# Patient Record
Sex: Female | Born: 1935 | Race: White | Hispanic: No | Smoking: Former smoker
Health system: Southern US, Community
[De-identification: ages and names within clinical notes are randomized; demographics above are authoritative.]

## PROBLEM LIST (undated history)

## (undated) DIAGNOSIS — H35039 Hypertensive retinopathy, unspecified eye: Secondary | ICD-10-CM

## (undated) DIAGNOSIS — H3530 Unspecified macular degeneration: Secondary | ICD-10-CM

## (undated) DIAGNOSIS — J449 Chronic obstructive pulmonary disease, unspecified: Secondary | ICD-10-CM

## (undated) DIAGNOSIS — M069 Rheumatoid arthritis, unspecified: Secondary | ICD-10-CM

## (undated) DIAGNOSIS — D329 Benign neoplasm of meninges, unspecified: Secondary | ICD-10-CM

## (undated) DIAGNOSIS — H409 Unspecified glaucoma: Secondary | ICD-10-CM

## (undated) HISTORY — PX: HYSTEROSCOPY WITH D & C: SHX1775

## (undated) HISTORY — PX: INGUINAL HERNIA REPAIR: SUR1180

## (undated) HISTORY — DX: Unspecified glaucoma: H40.9

## (undated) HISTORY — PX: APPENDECTOMY: SHX54

## (undated) HISTORY — PX: CRANIECTOMY / CRANIOTOMY FOR EXCISION OF BRAIN TUMOR: SUR320

## (undated) HISTORY — PX: CHOLECYSTECTOMY: SHX55

## (undated) HISTORY — DX: Benign neoplasm of meninges, unspecified: D32.9

## (undated) HISTORY — PX: EYE SURGERY: SHX253

## (undated) HISTORY — DX: Rheumatoid arthritis, unspecified: M06.9

## (undated) HISTORY — DX: Chronic obstructive pulmonary disease, unspecified: J44.9

## (undated) HISTORY — DX: Hypertensive retinopathy, unspecified eye: H35.039

## (undated) HISTORY — PX: YAG LASER APPLICATION: SHX6189

## (undated) HISTORY — DX: Unspecified macular degeneration: H35.30

## (undated) HISTORY — PX: CATARACT EXTRACTION: SUR2

---

## 2018-04-10 DIAGNOSIS — M069 Rheumatoid arthritis, unspecified: Secondary | ICD-10-CM | POA: Diagnosis present

## 2018-04-10 DIAGNOSIS — I10 Essential (primary) hypertension: Secondary | ICD-10-CM | POA: Insufficient documentation

## 2018-04-10 DIAGNOSIS — E785 Hyperlipidemia, unspecified: Secondary | ICD-10-CM | POA: Diagnosis present

## 2018-04-10 DIAGNOSIS — E039 Hypothyroidism, unspecified: Secondary | ICD-10-CM | POA: Diagnosis present

## 2018-04-11 DIAGNOSIS — J431 Panlobular emphysema: Secondary | ICD-10-CM | POA: Insufficient documentation

## 2019-10-24 DIAGNOSIS — M7989 Other specified soft tissue disorders: Secondary | ICD-10-CM | POA: Insufficient documentation

## 2019-10-24 DIAGNOSIS — D849 Immunodeficiency, unspecified: Secondary | ICD-10-CM | POA: Insufficient documentation

## 2019-12-10 DIAGNOSIS — N390 Urinary tract infection, site not specified: Secondary | ICD-10-CM | POA: Insufficient documentation

## 2020-01-02 ENCOUNTER — Ambulatory Visit (INDEPENDENT_AMBULATORY_CARE_PROVIDER_SITE_OTHER): Payer: Medicare Other | Admitting: Ophthalmology

## 2020-01-02 ENCOUNTER — Other Ambulatory Visit: Payer: Self-pay

## 2020-01-02 ENCOUNTER — Encounter (INDEPENDENT_AMBULATORY_CARE_PROVIDER_SITE_OTHER): Payer: Self-pay | Admitting: Ophthalmology

## 2020-01-02 DIAGNOSIS — H401190 Primary open-angle glaucoma, unspecified eye, stage unspecified: Secondary | ICD-10-CM | POA: Diagnosis present

## 2020-01-02 DIAGNOSIS — H3581 Retinal edema: Secondary | ICD-10-CM | POA: Diagnosis not present

## 2020-01-02 DIAGNOSIS — Z961 Presence of intraocular lens: Secondary | ICD-10-CM

## 2020-01-02 DIAGNOSIS — H04123 Dry eye syndrome of bilateral lacrimal glands: Secondary | ICD-10-CM

## 2020-01-02 DIAGNOSIS — H53002 Unspecified amblyopia, left eye: Secondary | ICD-10-CM

## 2020-01-02 DIAGNOSIS — H353122 Nonexudative age-related macular degeneration, left eye, intermediate dry stage: Secondary | ICD-10-CM

## 2020-01-02 DIAGNOSIS — H353211 Exudative age-related macular degeneration, right eye, with active choroidal neovascularization: Secondary | ICD-10-CM | POA: Diagnosis not present

## 2020-01-02 DIAGNOSIS — H35033 Hypertensive retinopathy, bilateral: Secondary | ICD-10-CM

## 2020-01-02 DIAGNOSIS — I10 Essential (primary) hypertension: Secondary | ICD-10-CM

## 2020-01-02 DIAGNOSIS — H401132 Primary open-angle glaucoma, bilateral, moderate stage: Secondary | ICD-10-CM

## 2020-01-02 NOTE — Progress Notes (Signed)
[facility] Note  01/02/2020     CHIEF COMPLAINT Patient presents for Retina Evaluation   HISTORY OF PRESENT ILLNESS: Kayla Hurley is a 85 y.o. female who presents to the clinic today for:   HPI    Retina Evaluation    In both eyes.  Duration of years.  Context:  night driving.  I, the attending physician,  performed the HPI with the patient and updated documentation appropriately.          Comments    Retina Eval OU per Dr. Kathlen Mody.  ARMD OU with possible CNV OD.  Elevated IOP Tuesday at Dr. Isidore Moos office 21/28.  Discussed starting glaucoma drops vs laser next visit.   POAG OS, gl susp OD, Pseudo OU, YAG PC OU, RA (Methotrexate)       Last edited by Bernarda Caffey, MD on 01/02/2020 10:29 AM. (History)      Referring physician: Hortencia Pilar, MD [place],  [place] [address]  HISTORICAL INFORMATION:   Selected notes from the MEDICAL RECORD NUMBER Referred by Dr. Kathlen Mody for concern of exu ARMD OD    CURRENT MEDICATIONS: No current outpatient medications on file. (Ophthalmic Drugs)   No current facility-administered medications for this visit. (Ophthalmic Drugs)   No current outpatient medications on file. (Other)   No current facility-administered medications for this visit. (Other)      REVIEW OF SYSTEMS: ROS    Positive for: Musculoskeletal, Endocrine   Negative for: Constitutional, Gastrointestinal, Neurological, Skin, Genitourinary, HENT, Cardiovascular, Eyes, Respiratory, Psychiatric, Allergic/Imm, Heme/Lymph   Last edited by Leonie Douglas, COA on 01/02/2020  9:10 AM. (History)       ALLERGIES Allergies  Allergen Reactions  . Penicillins Diarrhea, Nausea And Vomiting and Rash    PAST MEDICAL HISTORY Past Medical History:  Diagnosis Date  . Glaucoma   . Macular degeneration    Past Surgical History:  Procedure Laterality Date  . EYE SURGERY    . YAG LASER APPLICATION      FAMILY  HISTORY Family History  Family history unknown: Yes    SOCIAL HISTORY Social History   Tobacco Use  . Smoking status: Former Research scientist (life sciences)  . Smokeless tobacco: Never Used  Substance Use Topics  . Alcohol use: Not on file  . Drug use: Not on file         OPHTHALMIC EXAM:  Base Eye Exam    Visual Acuity (Snellen - Linear)      Right Left   Dist cc 20/20 20/80 -2   Dist ph cc  20/60 -2       Tonometry (Tonopen, 9:25 AM)      Right Left   Pressure 17 24       Pupils      Dark Light Shape React APD   Right 2 1 Round Brisk None   Left 2 1 Round Brisk None       Visual Fields (Counting fingers)      Left Right    Full Full       Extraocular Movement      Right Left    Full Full       Neuro/Psych    Oriented x3: Yes   Mood/Affect: Normal       Dilation    Both eyes: 1.0% Mydriacyl, 2.5% Phenylephrine @ 9:25 AM        Slit Lamp and Fundus Exam    Slit Lamp Exam  Right Left   Lids/Lashes Dermato Dermato, mild MGD   Conjunctiva/Sclera White and quiet White and quiet   Cornea Mild arcus, trace PEE, mild tear film debris 2-3+ PEE, mild tear film debris   Anterior Chamber Deep and quiet Deep and quiet   Iris Round and dilated Round and dilated   Lens PCIOL in excellent position, open pc PCIOL in excellent position   Vitreous syneresis syneresis       Fundus Exam      Right Left   Disc Pink, sharp, +cupping and central pallor, mild temporal PPA Pink and sharp, +cupping   C/D Ratio 0.65 0.6   Macula Blunted foveal reflex, drusen, RPE mottling, clumping and strophy. +CNV and shallow SRF, no heme Drusen, RPE mottling, clumping and early atrophy   Vessels Attenuated and mild tortuosity Attenuated and mild tortuoisty   Periphery Attached, mild peripheral drusen and mild reticular degeneration Attached, mild peripheral drusen and mild reticular degeneration        Refraction    Wearing Rx      Sphere Cylinder Axis Add   Right -0.50 +1.75 010 +2.50   Left  -2.50 +2.00 175 +2.50       Manifest Refraction      Sphere Cylinder Axis Dist VA   Right -0.75 +1.50 180 20/20   Left -1.00 +2.75 165 20/50-2          IMAGING AND PROCEDURES  Imaging and Procedures for 01/02/2020  OCT, Retina - OU - Both Eyes       Right Eye Quality was good. Central Foveal Thickness: 307. Progression has no prior data. Findings include abnormal foveal contour, no IRF, subretinal fluid, outer retinal atrophy, subretinal hyper-reflective material, retinal drusen  (CNVM w/shallow SRF).   Left Eye Quality was good. Central Foveal Thickness: 276. Progression has no prior data. Findings include normal foveal contour, no IRF, no SRF, retinal drusen , outer retinal atrophy.   Notes *Images captured and stored on drive  Diagnosis / Impression:  OD: Exu ARMD.  CNVM w/shallow SRF OS: Non-exu ARMD  Clinical management:  See below  Abbreviations: NFP - Normal foveal profile. CME - cystoid macular edema. PED - pigment epithelial detachment. IRF - intraretinal fluid. SRF - subretinal fluid. EZ - ellipsoid zone. ERM - epiretinal membrane. ORA - outer retinal atrophy. ORT - outer retinal tubulation. SRHM - subretinal hyper-reflective material. IRHM - intraretinal hyper-reflective material       Intravitreal Injection, Pharmacologic Agent - OD - Right Eye       Time Out 01/02/2020. 10:56 AM. Confirmed correct patient, procedure, site, and patient consented.   Anesthesia Topical anesthesia was used. Anesthetic medications included Lidocaine 2%, Proparacaine 0.5%.   Procedure Preparation included 5% betadine to ocular surface, eyelid speculum. A supplied needle was used.   Injection:  1.25 mg Bevacizumab (AVASTIN) SOLN   NDC: 78295-621-30, Lot: [id number] , Expiration date: 01/29/2020   Route: Intravitreal, Site: Right Eye, Waste: 0 mL  Post-op Post injection exam found visual acuity of at least counting fingers, no retinal detachment. The patient tolerated the  procedure well. There were no complications. The patient received written and verbal post procedure care education. Post injection medications were not given.                 ASSESSMENT/PLAN:    ICD-10-CM   1. Exudative age-related macular degeneration of right eye with active choroidal neovascularization (HCC)  H35.3211 Intravitreal Injection, Pharmacologic Agent - OD - Right Eye  Bevacizumab (AVASTIN) SOLN 1.25 mg  2. Retinal edema  H35.81 OCT, Retina - OU - Both Eyes  3. Intermediate stage nonexudative age-related macular degeneration of left eye  H35.3122   4. Amblyopia of eye, left  H53.002   5. Primary open angle glaucoma (POAG) of both eyes, moderate stage  H40.1132   6. Essential hypertension  I10   7. Hypertensive retinopathy of both eyes  H35.033   8. Pseudophakia of both eyes  Z96.1   9. Dry eyes, bilateral  H04.123     1,2. Exudative age related macular degeneration, OD  - The incidence pathology and anatomy of wet AMD discussed   - discussed treatment options including observation vs intravitreal anti-VEGF agents such as Avastin, Lucentis, Eylea.    - Risks of endophthalmitis and vascular occlusive events and atrophic changes discussed with patient  - OCT 7.29.21 shows CNVM w/shallow SRF OD  - BCVA 20/20  - recommend IVA OD #1 today, 7.29.21 -- for +CNV w/ SRF OD (OS amblyopic) - pt wishes to be treated with IVA OD - RBA of procedure discussed, questions answered - informed consent obtained and signed - see procedure note  - f/u in 4 wks -- DFE/OCT/Optos FA (Transit OD), possible injection  3. Age related macular degeneration, non-exudative, OS  - The incidence, anatomy, and pathology of dry AMD, risk of progression, and the AREDS and AREDS 2 studies including smoking risks discussed with patient.  - Recommend amsler grid monitoring  4. Amblyopia OS  - long standing  - monitor  5. POAG OU  - under the expert management of Dr. Kathlen Mody  - IOP 17, 24  6,7.  Hypertensive retinopathy OU - discussed importance of tight BP control - monitor  8. Pseudophakia OU  - s/p CE/IOL  - IOL in good position, doing well  - monitor  9. Dry eyes OU - recommend artificial tears and lubricating ointment as needed  Ophthalmic Meds Ordered this visit:  Meds ordered this encounter  Medications  . Bevacizumab (AVASTIN) SOLN 1.25 mg       Return in about 4 weeks (around 01/30/2020) for exu ARMD OD w/DFE/OCT/Optos FA (Transit OD), poss. inj..  There are no Patient Instructions on file for this visit.   Explained the diagnoses, plan, and follow up with the patient and they expressed understanding.  Patient expressed understanding of the importance of proper follow up care.  This document serves as a record of services personally performed by Gardiner Sleeper, MD, PhD. It was created on their behalf by Estill Bakes, COT an ophthalmic technician. The creation of this record is the provider's dictation and/or activities during the visit.    Electronically signed by: Estill Bakes, COT 7.29.21 @ 1:46 AM  Gardiner Sleeper, M.D., Ph.D. Diseases & Surgery of the Retina and Vitreous [facility]  I have reviewed the above documentation for accuracy and completeness, and I agree with the above. Gardiner Sleeper, M.D., Ph.D. 01/05/20 1:46 AM   Abbreviations: M myopia (nearsighted); A astigmatism; H hyperopia (farsighted); P presbyopia; Mrx spectacle prescription;  CTL contact lenses; OD right eye; OS left eye; OU both eyes  XT exotropia; ET esotropia; PEK punctate epithelial keratitis; PEE punctate epithelial erosions; DES dry eye syndrome; MGD meibomian gland dysfunction; ATs artificial tears; PFAT's preservative free artificial tears; [place] nuclear sclerotic cataract; PSC posterior subcapsular cataract; ERM epi-retinal membrane; PVD posterior vitreous detachment; RD retinal detachment; DM diabetes mellitus; DR diabetic retinopathy; NPDR  non-proliferative diabetic  retinopathy; PDR proliferative diabetic retinopathy; CSME clinically significant macular edema; DME diabetic macular edema; dbh dot blot hemorrhages; CWS cotton wool spot; POAG primary open angle glaucoma; C/D cup-to-disc ratio; HVF humphrey visual field; GVF goldmann visual field; OCT optical coherence tomography; IOP intraocular pressure; BRVO Branch retinal vein occlusion; CRVO central retinal vein occlusion; CRAO central retinal artery occlusion; BRAO branch retinal artery occlusion; RT retinal tear; SB scleral buckle; PPV pars plana vitrectomy; VH Vitreous hemorrhage; PRP panretinal laser photocoagulation; IVK intravitreal kenalog; VMT vitreomacular traction; MH Macular hole;  NVD neovascularization of the disc; NVE neovascularization elsewhere; AREDS age related eye disease study; ARMD age related macular degeneration; POAG primary open angle glaucoma; EBMD epithelial/anterior basement membrane dystrophy; ACIOL anterior chamber intraocular lens; IOL intraocular lens; PCIOL posterior chamber intraocular lens; Phaco/IOL phacoemulsification with intraocular lens placement; [place] photorefractive keratectomy; LASIK laser assisted in situ keratomileusis; HTN hypertension; DM diabetes mellitus; COPD chronic obstructive pulmonary disease

## 2020-01-05 MED ORDER — BEVACIZUMAB CHEMO INJECTION 1.25MG/0.05ML SYRINGE FOR KALEIDOSCOPE
1.2500 mg | INTRAVITREAL | Status: AC | PRN
Start: 2020-01-05 — End: 2020-01-05
  Administered 2020-01-05: 1.25 mg via INTRAVITREAL

## 2020-01-07 DIAGNOSIS — H35039 Hypertensive retinopathy, unspecified eye: Secondary | ICD-10-CM | POA: Insufficient documentation

## 2020-01-30 NOTE — Progress Notes (Addendum)
[facility] Note  02/03/2020     CHIEF COMPLAINT Patient presents for Retina Follow Up   HISTORY OF PRESENT ILLNESS: Kayla Hurley is a 85 y.o. female who presents to the clinic today for:   HPI    Retina Follow Up    Patient presents with  Wet AMD.  In right eye.  This started weeks ago.  Severity is moderate.  Duration of weeks.  Since onset it is stable.  I, the attending physician,  performed the HPI with the patient and updated documentation appropriately.          Comments    Pt states vision is the same OU.  Patient denies eye pain or discomfort.  Patient denies any new or worsening floaters or fol OU.       Last edited by Bernarda Caffey, MD on 02/03/2020 12:42 PM. (History)    pt states she had some floaters after her first injection, but she doesn't feel like her vision has changed since then, pt has an appt with Dr. Kathlen Mody tomorrow   Referring physician: Hortencia Pilar, MD [place],  [place] [address]  HISTORICAL INFORMATION:   Selected notes from the MEDICAL RECORD NUMBER Referred by Dr. Kathlen Mody for concern of exu ARMD OD    CURRENT MEDICATIONS: No current outpatient medications on file. (Ophthalmic Drugs)   No current facility-administered medications for this visit. (Ophthalmic Drugs)   No current outpatient medications on file. (Other)   No current facility-administered medications for this visit. (Other)      REVIEW OF SYSTEMS: ROS    Positive for: Musculoskeletal, Endocrine   Negative for: Constitutional, Gastrointestinal, Neurological, Skin, Genitourinary, HENT, Cardiovascular, Eyes, Respiratory, Psychiatric, Allergic/Imm, Heme/Lymph   Last edited by Doneen Poisson on 02/03/2020  9:25 AM. (History)       ALLERGIES Allergies  Allergen Reactions  . Penicillins Diarrhea, Nausea And Vomiting and Rash    PAST MEDICAL HISTORY Past Medical History:  Diagnosis Date  . Glaucoma   . Macular  degeneration    Past Surgical History:  Procedure Laterality Date  . EYE SURGERY    . YAG LASER APPLICATION      FAMILY HISTORY Family History  Family history unknown: Yes    SOCIAL HISTORY Social History   Tobacco Use  . Smoking status: Former Research scientist (life sciences)  . Smokeless tobacco: Never Used  Substance Use Topics  . Alcohol use: Not on file  . Drug use: Not on file         OPHTHALMIC EXAM:  Base Eye Exam    Visual Acuity (Snellen - Linear)      Right Left   Dist [place] 20/20 20/70   Dist ph [place]  NI       Tonometry (Tonopen, 9:31 AM)      Right Left   Pressure 18 22       Pupils      Dark Light Shape React APD   Right 3 2 Round Brisk 0   Left 3 2 Round Brisk 0       Visual Fields      Left Right    Full Full       Extraocular Movement      Right Left    Full Full       Neuro/Psych    Oriented x3: Yes   Mood/Affect: Normal       Dilation    Both eyes: 1.0% Mydriacyl,  2.5% Phenylephrine @ 9:31 AM        Slit Lamp and Fundus Exam    Slit Lamp Exam      Right Left   Lids/Lashes Dermato Dermato, mild MGD   Conjunctiva/Sclera White and quiet White and quiet   Cornea Mild arcus, trace PEE, mild tear film debris 2-3+ PEE, mild tear film debris   Anterior Chamber Deep and quiet Deep and quiet   Iris Round and dilated Round and dilated   Lens PCIOL in excellent position, open pc PCIOL in excellent position   Vitreous syneresis syneresis       Fundus Exam      Right Left   Disc Pink, sharp, +cupping and central pallor, mild temporal PPA Pink and sharp, +cupping   C/D Ratio 0.65 0.6   Macula Blunted foveal reflex, drusen, RPE mottling, clumping and atrophy, +CNV and shallow SRF - improved, no heme Drusen, RPE mottling, clumping and early atrophy   Vessels Attenuated and mild tortuosity Attenuated and mild tortuoisty   Periphery Attached, mild peripheral drusen and mild reticular degeneration, focal paving stone inferiorly Attached, mild peripheral drusen and  mild reticular degeneration        Refraction    Wearing Rx      Sphere Cylinder Axis Add   Right -0.50 +1.75 010 +2.50   Left -2.50 +2.00 175 +2.50          IMAGING AND PROCEDURES  Imaging and Procedures for 02/03/2020  OCT, Retina - OU - Both Eyes       Right Eye Quality was good. Central Foveal Thickness: 288. Progression has improved. Findings include abnormal foveal contour, no IRF, outer retinal atrophy, retinal drusen , no SRF, pigment epithelial detachment (Interval improvement in [facility] and SRF overlying low lying PED).   Left Eye Quality was good. Central Foveal Thickness: 271. Progression has been stable. Findings include normal foveal contour, no IRF, no SRF, retinal drusen , outer retinal atrophy.   Notes *Images captured and stored on drive  Diagnosis / Impression:  OD: exudative ARMD -- Interval improvement in [facility] and SRF overlying low lying PED OS: nonexudative ARMD;  NFP, no IRF/SRF  Clinical management:  See below  Abbreviations: NFP - Normal foveal profile. CME - cystoid macular edema. PED - pigment epithelial detachment. IRF - intraretinal fluid. SRF - subretinal fluid. EZ - ellipsoid zone. ERM - epiretinal membrane. ORA - outer retinal atrophy. ORT - outer retinal tubulation. SRHM - subretinal hyper-reflective material. IRHM - intraretinal hyper-reflective material        Fluorescein Angiography Optos (Transit OD)       Right Eye   Progression has no prior data. Early phase findings include delayed filling, window defect, staining (Focal delay in venous return IT venule). Mid/Late phase findings include staining, leakage, window defect, choroidal neovascularization (Focal leaking CNV IT to fovea).   Left Eye   Progression has no prior data. Early phase findings include (No early images). Mid/Late phase findings include staining, window defect (No leakage, no CNV).   Notes **Images stored on drive**  Impression: OD: exu ARMD with focal CNV  IT to fovea; Focal delay in venous return IT venule OS: non-exu ARMD, no CNV, no leakage         Intravitreal Injection, Pharmacologic Agent - OD - Right Eye       Time Out 02/03/2020. 11:18 AM. Confirmed correct patient, procedure, site, and patient consented.   Anesthesia Topical anesthesia was used. Anesthetic medications included Lidocaine 2%,  Proparacaine 0.5%.   Procedure Preparation included 5% betadine to ocular surface, eyelid speculum. A supplied needle was used.   Injection:  1.25 mg Bevacizumab (AVASTIN) SOLN   NDC: 00762-263-33, Lot: [id number] , Expiration date: 03/22/2020   Route: Intravitreal, Site: Right Eye, Waste: 0 mL  Post-op Post injection exam found visual acuity of at least counting fingers. The patient tolerated the procedure well. There were no complications. The patient received written and verbal post procedure care education.                 ASSESSMENT/PLAN:    ICD-10-CM   1. Exudative age-related macular degeneration of right eye with active choroidal neovascularization (HCC)  H35.3211 Intravitreal Injection, Pharmacologic Agent - OD - Right Eye    Bevacizumab (AVASTIN) SOLN 1.25 mg  2. Retinal edema  H35.81 OCT, Retina - OU - Both Eyes  3. Intermediate stage nonexudative age-related macular degeneration of left eye  H35.3122   4. Amblyopia of eye, left  H53.002   5. Primary open angle glaucoma (POAG) of both eyes, moderate stage  H40.1132   6. Essential hypertension  I10   7. Hypertensive retinopathy of both eyes  H35.033 Fluorescein Angiography Optos (Transit OD)  8. Pseudophakia of both eyes  Z96.1   9. Dry eyes, bilateral  H04.123     1,2. Exudative age related macular degeneration, OD  - OCT at presentation 07.29.21 showed CNVM w/shallow SRF OD             - s/p IVA OD # 1 (07.29.21)  - BCVA 20/20 (stable)  - OCT shows shows interval improvement in [facility] and SRF OD  - FA 8.30.21 shows focal CNVM  - recommend IVA OD #2 today,  08.30.21 - pt wishes to be treated with IVA OD - RBA of procedure discussed, questions answered - informed consent obtained and signed - see procedure note  - f/u in 4 wks -- DFE/OCT/possible injection  3. Age related macular degeneration, non-exudative, OS  - The incidence, anatomy, and pathology of dry AMD, risk of progression, and the AREDS and AREDS 2 studies including smoking risks discussed with patient.  - Recommend amsler grid monitoring  4. Amblyopia OS  - long standing  - monitor  5. POAG OU  - under the expert management of Dr. 09.12.21  - IOP 18,22  6,7. Hypertensive retinopathy OU - discussed importance of tight BP control - monitor  8. Pseudophakia OU  - s/p CE/IOL  - IOL in good position, doing well  - monitor  9. Dry eyes OU - recommend artificial tears and lubricating ointment as needed  Ophthalmic Meds Ordered this visit:  Meds ordered this encounter  Medications  . Bevacizumab (AVASTIN) SOLN 1.25 mg       Return in about 4 weeks (around 03/02/2020) for f/u exu ARMD OD, DFE, OCT.  There are no Patient Instructions on file for this visit.  This document serves as a record of services personally performed by 03/15/2020, MD, PhD. It was created on their behalf by Gardiner Sleeper, [place], an ophthalmic technician. The creation of this record is the provider's dictation and/or activities during the visit.    Electronically signed by: [address], COA 08.26.2021 12:53 PM   This document serves as a record of services personally performed by 09.08.2021, MD, PhD. It was created on their behalf by Gardiner Sleeper. San Jetty, OA an ophthalmic technician. The creation of this record is the provider's dictation and/or activities during the visit.  Electronically signed by: San Jetty. Owens Shark, [place] 08.30.2021 12:53 PM  Gardiner Sleeper, M.D., Ph.D. Diseases & Surgery of the Retina and [place] 02/03/2020   I have reviewed the  above documentation for accuracy and completeness, and I agree with the above. Gardiner Sleeper, M.D., Ph.D. 02/03/20 12:53 PM   Abbreviations: M myopia (nearsighted); A astigmatism; H hyperopia (farsighted); P presbyopia; Mrx spectacle prescription;  CTL contact lenses; OD right eye; OS left eye; OU both eyes  XT exotropia; ET esotropia; PEK punctate epithelial keratitis; PEE punctate epithelial erosions; DES dry eye syndrome; MGD meibomian gland dysfunction; ATs artificial tears; PFAT's preservative free artificial tears; [place] nuclear sclerotic cataract; PSC posterior subcapsular cataract; ERM epi-retinal membrane; PVD posterior vitreous detachment; RD retinal detachment; DM diabetes mellitus; DR diabetic retinopathy; NPDR non-proliferative diabetic retinopathy; PDR proliferative diabetic retinopathy; CSME clinically significant macular edema; DME diabetic macular edema; dbh dot blot hemorrhages; CWS cotton wool spot; POAG primary open angle glaucoma; C/D cup-to-disc ratio; HVF humphrey visual field; GVF goldmann visual field; OCT optical coherence tomography; IOP intraocular pressure; BRVO Branch retinal vein occlusion; CRVO central retinal vein occlusion; CRAO central retinal artery occlusion; BRAO branch retinal artery occlusion; RT retinal tear; SB scleral buckle; PPV pars plana vitrectomy; VH Vitreous hemorrhage; PRP panretinal laser photocoagulation; IVK intravitreal kenalog; VMT vitreomacular traction; MH Macular hole;  NVD neovascularization of the disc; NVE neovascularization elsewhere; AREDS age related eye disease study; ARMD age related macular degeneration; POAG primary open angle glaucoma; EBMD epithelial/anterior basement membrane dystrophy; ACIOL anterior chamber intraocular lens; IOL intraocular lens; PCIOL posterior chamber intraocular lens; Phaco/IOL phacoemulsification with intraocular lens placement; [place] photorefractive keratectomy; LASIK laser assisted in situ keratomileusis; HTN  hypertension; DM diabetes mellitus; COPD chronic obstructive pulmonary disease

## 2020-02-03 ENCOUNTER — Other Ambulatory Visit: Payer: Self-pay

## 2020-02-03 ENCOUNTER — Ambulatory Visit (INDEPENDENT_AMBULATORY_CARE_PROVIDER_SITE_OTHER): Payer: Medicare Other | Admitting: Ophthalmology

## 2020-02-03 ENCOUNTER — Encounter (INDEPENDENT_AMBULATORY_CARE_PROVIDER_SITE_OTHER): Payer: Self-pay | Admitting: Ophthalmology

## 2020-02-03 DIAGNOSIS — H35033 Hypertensive retinopathy, bilateral: Secondary | ICD-10-CM

## 2020-02-03 DIAGNOSIS — H3581 Retinal edema: Secondary | ICD-10-CM | POA: Diagnosis not present

## 2020-02-03 DIAGNOSIS — H353211 Exudative age-related macular degeneration, right eye, with active choroidal neovascularization: Secondary | ICD-10-CM | POA: Diagnosis not present

## 2020-02-03 DIAGNOSIS — H401132 Primary open-angle glaucoma, bilateral, moderate stage: Secondary | ICD-10-CM

## 2020-02-03 DIAGNOSIS — H353122 Nonexudative age-related macular degeneration, left eye, intermediate dry stage: Secondary | ICD-10-CM | POA: Diagnosis not present

## 2020-02-03 DIAGNOSIS — H53002 Unspecified amblyopia, left eye: Secondary | ICD-10-CM | POA: Diagnosis not present

## 2020-02-03 DIAGNOSIS — H04123 Dry eye syndrome of bilateral lacrimal glands: Secondary | ICD-10-CM

## 2020-02-03 DIAGNOSIS — Z961 Presence of intraocular lens: Secondary | ICD-10-CM

## 2020-02-03 DIAGNOSIS — I10 Essential (primary) hypertension: Secondary | ICD-10-CM

## 2020-02-03 MED ORDER — BEVACIZUMAB CHEMO INJECTION 1.25MG/0.05ML SYRINGE FOR KALEIDOSCOPE
1.2500 mg | INTRAVITREAL | Status: AC | PRN
Start: 2020-02-03 — End: 2020-02-03
  Administered 2020-02-03: 1.25 mg via INTRAVITREAL

## 2020-02-07 ENCOUNTER — Encounter: Payer: Self-pay | Admitting: Neurology

## 2020-02-14 NOTE — Progress Notes (Deleted)
Assessment/Plan:   1.  Essential Tremor.  -This is evidenced by the symmetrical nature and longstanding hx of gradually getting worse.  We discussed nature and pathophysiology.  We discussed that this can continue to gradually get worse with time.  We discussed that some medications can worsen this, as can caffeine use.  We discussed medication therapy as well as surgical therapy.  Ultimately, the patient decided to ***.     Subjective:   Kayla Hurley was seen in consultation in the movement disorder clinic at the request of [place], Jimmy Picket*.  The evaluation is for tremor.  Patient previously evaluated and seen at [facility] neurology.  Those records are not available to me today.  Tremor started approximately *** ago and involves the ***.  Tremor is most noticeable when ***.   There is *** family hx of tremor.    Affected by caffeine:  {yes no:314532} Affected by alcohol:  {yes no:314532} Affected by stress:  {yes no:314532} Affected by fatigue:  {yes no:314532} Spills soup if on spoon:  {yes no:314532} Spills glass of liquid if full:  {yes no:314532} Affects ADL's (tying shoes, brushing teeth, etc):  {yes no:314532}  Current/Previously tried tremor medications: ***Primidone, 50 mg, half tablet at bedtime (apparently tried larger dosages and had vertigo); propranolol, 10 mg 3 times per day  Current medications that may exacerbate tremor:  ***  Outside reports reviewed: {Outside review:15817}.  Allergies  Allergen Reactions  . Penicillins Diarrhea, Nausea And Vomiting and Rash    No current outpatient medications   Objective:   VITALS:  There were no vitals filed for this visit. Gen:  Appears stated age and in NAD. HEENT:  Normocephalic, atraumatic. The mucous membranes are moist. The superficial temporal arteries are without ropiness or tenderness. Cardiovascular: Regular rate and rhythm. Lungs: Clear to auscultation bilaterally. Neck: There are no carotid bruits noted  bilaterally.  NEUROLOGICAL:  Orientation:  The patient is alert and oriented x 3.   Cranial nerves: There is good facial symmetry. Extraocular muscles are intact and visual fields are full to confrontational testing. Speech is fluent and clear. Soft palate rises symmetrically and there is no tongue deviation. Hearing is intact to conversational tone. Tone: Tone is good throughout. Sensation: Sensation is intact to light touch touch throughout (facial, trunk, extremities). Vibration is intact at the bilateral big toe. There is no extinction with double simultaneous stimulation. There is no sensory dermatomal level identified. Coordination:  The patient has no dysdiadichokinesia or dysmetria. Motor: Strength is 5/5 in the bilateral upper and lower extremities.  Shoulder shrug is equal bilaterally.  There is no pronator drift.  There are no fasciculations noted. DTR's: Deep tendon reflexes are 2/4 at the bilateral biceps, triceps, brachioradialis, patella and achilles.  Plantar responses are downgoing bilaterally. Gait and Station: The patient is able to ambulate without difficulty. The patient is able to heel toe walk without any difficulty. The patient is able to ambulate in a tandem fashion. The patient is able to stand in the Romberg position.   MOVEMENT EXAM: Tremor:  There is *** tremor in the UE, noted most significantly with action.  The patient is *** able to draw Archimedes spirals without significant difficulty.  There is *** tremor at rest.  The patient is *** able to pour water from one glass to another without spilling it.  I have reviewed and interpreted the following labs independently She had lab work on July 05, 2019.  White blood cells were 7.9, hemoglobin 14.3, hematocrit 41.3  and platelets 158.  Sodium was 141, potassium 3.9, chloride 103, CO2 34, BUN 24, creatinine 0.51, AST 26, ALT 18.   Total time spent on today's visit was ***60 minutes, including both face-to-face time and  nonface-to-face time.  Time included that spent on review of records (prior notes available to me/labs/imaging if pertinent), discussing treatment and goals, answering patient's questions and coordinating care.  CC:  Buzzy Han, MD

## 2020-02-18 ENCOUNTER — Ambulatory Visit: Payer: Medicare Other | Admitting: Neurology

## 2020-02-18 DIAGNOSIS — F419 Anxiety disorder, unspecified: Secondary | ICD-10-CM | POA: Insufficient documentation

## 2020-02-27 NOTE — Progress Notes (Signed)
[facility] Note  03/02/2020     CHIEF COMPLAINT Patient presents for Retina Follow Up   HISTORY OF PRESENT ILLNESS: Kayla Hurley is a 85 y.o. female who presents to the clinic today for:   HPI    Retina Follow Up    Patient presents with  Wet AMD.  In right eye.  Severity is moderate.  Duration of 4 weeks.  Since onset it is stable.  I, the attending physician,  performed the HPI with the patient and updated documentation appropriately.          Comments    4 week Retina follow up for Exu Armd. Patient states vision is about the same.       Last edited by Bernarda Caffey, MD on 03/02/2020 12:09 PM. (History)    pt states she can't read small writing, but other than that her vision is doing well, she states Dr. Kathlen Mody gave her latanoprost to use in the left eye only   Referring physician: Hortencia Pilar, MD [place],  [place] [id number]  HISTORICAL INFORMATION:   Selected notes from the MEDICAL RECORD NUMBER Referred by Dr. Kathlen Mody for concern of exu ARMD OD    CURRENT MEDICATIONS: No current outpatient medications on file. (Ophthalmic Drugs)   No current facility-administered medications for this visit. (Ophthalmic Drugs)   No current outpatient medications on file. (Other)   No current facility-administered medications for this visit. (Other)      REVIEW OF SYSTEMS: ROS    Positive for: Musculoskeletal, Endocrine   Negative for: Constitutional, Gastrointestinal, Neurological, Skin, Genitourinary, HENT, Cardiovascular, Eyes, Respiratory, Psychiatric, Allergic/Imm, Heme/Lymph   Last edited by Elmore Guise, COT on 03/02/2020  9:44 AM. (History)       ALLERGIES Allergies  Allergen Reactions  . Penicillins Diarrhea, Nausea And Vomiting and Rash    PAST MEDICAL HISTORY Past Medical History:  Diagnosis Date  . Glaucoma   . Macular degeneration    Past Surgical History:  Procedure Laterality Date  .  EYE SURGERY    . YAG LASER APPLICATION      FAMILY HISTORY Family History  Family history unknown: Yes    SOCIAL HISTORY Social History   Tobacco Use  . Smoking status: Former Research scientist (life sciences)  . Smokeless tobacco: Never Used  Substance Use Topics  . Alcohol use: Not on file  . Drug use: Not on file         OPHTHALMIC EXAM:  Base Eye Exam    Visual Acuity (Snellen - Linear)      Right Left   Dist cc 20/20-1 20/60   Dist ph cc  20/50+1   Correction: Glasses       Tonometry (Tonopen, 9:46 AM)      Right Left   Pressure 19 15       Pupils      Dark Light Shape React APD   Right 3 2 Round Brisk None   Left 3 2 Round Brisk None       Visual Fields (Counting fingers)      Left Right    Full Full       Extraocular Movement      Right Left    Full, Ortho Full, Ortho       Neuro/Psych    Oriented x3: Yes   Mood/Affect: Normal       Dilation    Both eyes: 1.0% Mydriacyl, 2.5% Phenylephrine @ 9:46  AM        Slit Lamp and Fundus Exam    Slit Lamp Exam      Right Left   Lids/Lashes Dermato Dermato, mild MGD   Conjunctiva/Sclera White and quiet White and quiet   Cornea Mild arcus, trace PEE, mild tear film debris 2-3+ PEE, mild tear film debris   Anterior Chamber Deep and quiet Deep and quiet   Iris Round and dilated Round and dilated   Lens PCIOL in excellent position, open pc PCIOL in excellent position   Vitreous syneresis syneresis       Fundus Exam      Right Left   Disc Pink, sharp, +cupping and central pallor, mild temporal PPA Pink and sharp, +cupping   C/D Ratio 0.65 0.6   Macula Blunted foveal reflex, drusen, RPE mottling, clumping and atrophy, +CNV and shallow SRF - improved, no heme Drusen, RPE mottling, clumping and early atrophy, No heme or edema   Vessels Attenuated and mild tortuosity Attenuated and mild tortuoisty   Periphery Attached, mild peripheral drusen, mild reticular degeneration, focal paving stone inferiorly Attached, mild peripheral  drusen and mild reticular degeneration        Refraction    Wearing Rx      Sphere Cylinder Axis Add   Right -0.50 +1.75 010 +2.50   Left -2.50 +2.00 175 +2.50          IMAGING AND PROCEDURES  Imaging and Procedures for 03/02/2020  OCT, Retina - OU - Both Eyes       Right Eye Quality was good. Central Foveal Thickness: 288. Progression has improved. Findings include abnormal foveal contour, no IRF, outer retinal atrophy, retinal drusen , no SRF, pigment epithelial detachment (Interval improvement in PEDs and overlying SRHM/SRF).   Left Eye Quality was good. Central Foveal Thickness: 273. Progression has been stable. Findings include normal foveal contour, no IRF, no SRF, retinal drusen , outer retinal atrophy.   Notes *Images captured and stored on drive  Diagnosis / Impression:  OD: exudative ARMD -- Interval improvement in PEDs and overlying SRHM/SRF OS: nonexudative ARMD;  NFP, no IRF/SRF  Clinical management:  See below  Abbreviations: NFP - Normal foveal profile. CME - cystoid macular edema. PED - pigment epithelial detachment. IRF - intraretinal fluid. SRF - subretinal fluid. EZ - ellipsoid zone. ERM - epiretinal membrane. ORA - outer retinal atrophy. ORT - outer retinal tubulation. SRHM - subretinal hyper-reflective material. IRHM - intraretinal hyper-reflective material        Intravitreal Injection, Pharmacologic Agent - OD - Right Eye       Time Out 03/02/2020. 10:47 AM. Confirmed correct patient, procedure, site, and patient consented.   Anesthesia Topical anesthesia was used. Anesthetic medications included Lidocaine 2%, Proparacaine 0.5%.   Procedure Preparation included 5% betadine to ocular surface, eyelid speculum. A supplied needle was used.   Injection:  1.25 mg Bevacizumab (AVASTIN) SOLN   NDC: 47425-956-38, Lot: [id number], Expiration date: 04/12/2020   Route: Intravitreal, Site: Right Eye, Waste: 0.05 mL  Post-op Post injection exam found  visual acuity of at least counting fingers. The patient tolerated the procedure well. There were no complications. The patient received written and verbal post procedure care education.                 ASSESSMENT/PLAN:    ICD-10-CM   1. Exudative age-related macular degeneration of right eye with active choroidal neovascularization (HCC)  H35.3211 Intravitreal Injection, Pharmacologic Agent - OD - Right Eye  Bevacizumab (AVASTIN) SOLN 1.25 mg  2. Retinal edema  H35.81 OCT, Retina - OU - Both Eyes  3. Intermediate stage nonexudative age-related macular degeneration of left eye  H35.3122   4. Amblyopia of eye, left  H53.002   5. Primary open angle glaucoma (POAG) of both eyes, moderate stage  H40.1132   6. Essential hypertension  I10   7. Hypertensive retinopathy of both eyes  H35.033   8. Pseudophakia of both eyes  Z96.1   9. Dry eyes, bilateral  H04.123     1,2. Exudative age related macular degeneration, OD  - OCT at presentation 07.29.21 showed CNVM w/shallow SRF OD  - FA 8.30.21 shows focal CNVM             - s/p IVA OD # 1 (07.29.21), #2 (08.30.21)  - excellent response  - BCVA 20/20 (stable)  - OCT shows shows interval improvement in PEDs and overly SRHM/SRF OD  - recommend IVA OD #3 today, 09.27.21 w/ extension to 5 wks - pt wishes to be treated with IVA OD - RBA of procedure discussed, questions answered - informed consent obtained and signed - see procedure note - discussed aggressive tx due to functional monocular status (OS amblyopia, 20/60)  - f/u in 5 wks -- DFE/OCT/possible injection  3. Age related macular degeneration, non-exudative, OS  - The incidence, anatomy, and pathology of dry AMD, risk of progression, and the AREDS and AREDS 2 studies including smoking risks discussed with patient.  - Recommend amsler grid monitoring  4. Amblyopia OS  - long standing  - monitor  5. POAG OU  - under the expert management of Dr. Kathlen Mody  - IOP 19,15  6,7.  Hypertensive retinopathy OU - discussed importance of tight BP control - monitor  8. Pseudophakia OU  - s/p CE/IOL  - IOL in good position, doing well  - monitor  9. Dry eyes OU - recommend artificial tears and lubricating ointment as needed  Ophthalmic Meds Ordered this visit:  Meds ordered this encounter  Medications  . Bevacizumab (AVASTIN) SOLN 1.25 mg       Return in about 5 weeks (around 04/06/2020) for f/u exu ARMD OD, DFE, OCT.  There are no Patient Instructions on file for this visit.  This document serves as a record of services personally performed by Gardiner Sleeper, MD, PhD. It was created on their behalf by Leeann Must, [place], an ophthalmic technician. The creation of this record is the provider's dictation and/or activities during the visit.    Electronically signed by: Leeann Must, [place] 09.23.2021 12:16 PM   This document serves as a record of services personally performed by Gardiner Sleeper, MD, PhD. It was created on their behalf by San Jetty. Owens Shark, OA an ophthalmic technician. The creation of this record is the provider's dictation and/or activities during the visit.    Electronically signed by: San Jetty. Owens Shark, [place] 09.27.2021 12:16 PM  Gardiner Sleeper, M.D., Ph.D. Diseases & Surgery of the Retina and [place] 03/02/2020   I have reviewed the above documentation for accuracy and completeness, and I agree with the above. Gardiner Sleeper, M.D., Ph.D. 03/02/20 12:18 PM   Abbreviations: M myopia (nearsighted); A astigmatism; H hyperopia (farsighted); P presbyopia; Mrx spectacle prescription;  CTL contact lenses; OD right eye; OS left eye; OU both eyes  XT exotropia; ET esotropia; PEK punctate epithelial keratitis; PEE punctate epithelial erosions; DES dry eye syndrome; MGD meibomian gland dysfunction;  ATs artificial tears; PFAT's preservative free artificial tears; [place] nuclear sclerotic cataract; PSC posterior subcapsular  cataract; ERM epi-retinal membrane; PVD posterior vitreous detachment; RD retinal detachment; DM diabetes mellitus; DR diabetic retinopathy; NPDR non-proliferative diabetic retinopathy; PDR proliferative diabetic retinopathy; CSME clinically significant macular edema; DME diabetic macular edema; dbh dot blot hemorrhages; CWS cotton wool spot; POAG primary open angle glaucoma; C/D cup-to-disc ratio; HVF humphrey visual field; GVF goldmann visual field; OCT optical coherence tomography; IOP intraocular pressure; BRVO Branch retinal vein occlusion; CRVO central retinal vein occlusion; CRAO central retinal artery occlusion; BRAO branch retinal artery occlusion; RT retinal tear; SB scleral buckle; PPV pars plana vitrectomy; VH Vitreous hemorrhage; PRP panretinal laser photocoagulation; IVK intravitreal kenalog; VMT vitreomacular traction; MH Macular hole;  NVD neovascularization of the disc; NVE neovascularization elsewhere; AREDS age related eye disease study; ARMD age related macular degeneration; POAG primary open angle glaucoma; EBMD epithelial/anterior basement membrane dystrophy; ACIOL anterior chamber intraocular lens; IOL intraocular lens; PCIOL posterior chamber intraocular lens; Phaco/IOL phacoemulsification with intraocular lens placement; [place] photorefractive keratectomy; LASIK laser assisted in situ keratomileusis; HTN hypertension; DM diabetes mellitus; COPD chronic obstructive pulmonary disease

## 2020-03-02 ENCOUNTER — Other Ambulatory Visit: Payer: Self-pay

## 2020-03-02 ENCOUNTER — Encounter (INDEPENDENT_AMBULATORY_CARE_PROVIDER_SITE_OTHER): Payer: Self-pay | Admitting: Ophthalmology

## 2020-03-02 ENCOUNTER — Ambulatory Visit (INDEPENDENT_AMBULATORY_CARE_PROVIDER_SITE_OTHER): Payer: Medicare Other | Admitting: Ophthalmology

## 2020-03-02 DIAGNOSIS — H353211 Exudative age-related macular degeneration, right eye, with active choroidal neovascularization: Secondary | ICD-10-CM | POA: Diagnosis not present

## 2020-03-02 DIAGNOSIS — H35033 Hypertensive retinopathy, bilateral: Secondary | ICD-10-CM

## 2020-03-02 DIAGNOSIS — H04123 Dry eye syndrome of bilateral lacrimal glands: Secondary | ICD-10-CM

## 2020-03-02 DIAGNOSIS — H53002 Unspecified amblyopia, left eye: Secondary | ICD-10-CM | POA: Diagnosis not present

## 2020-03-02 DIAGNOSIS — H3581 Retinal edema: Secondary | ICD-10-CM

## 2020-03-02 DIAGNOSIS — H353122 Nonexudative age-related macular degeneration, left eye, intermediate dry stage: Secondary | ICD-10-CM

## 2020-03-02 DIAGNOSIS — H401132 Primary open-angle glaucoma, bilateral, moderate stage: Secondary | ICD-10-CM

## 2020-03-02 DIAGNOSIS — Z961 Presence of intraocular lens: Secondary | ICD-10-CM

## 2020-03-02 DIAGNOSIS — I10 Essential (primary) hypertension: Secondary | ICD-10-CM

## 2020-03-02 MED ORDER — BEVACIZUMAB CHEMO INJECTION 1.25MG/0.05ML SYRINGE FOR KALEIDOSCOPE
1.2500 mg | INTRAVITREAL | Status: AC | PRN
  Administered 2020-03-02: 1.25 mg via INTRAVITREAL

## 2020-03-02 NOTE — Progress Notes (Signed)
Assessment/Plan:   1.  Essential Tremor.  -This is evidenced by the symmetrical nature and longstanding hx of gradually getting worse.  We discussed nature and pathophysiology.  We discussed that this can continue to gradually get worse with time.  We discussed that some medications can worsen this, as can caffeine use.  We discussed medication therapy as well as surgical therapy, both DBS and focused ultrasound.  Discussed topamax in detail.  discussed weighted spoons and forks and gloves.  Ultimately, the patient decided to try the [employer].  She was measured for the device today.  I did discuss with her that I was not sure Medicare would pay for it.  I also discussed that I did not expect that this would take her degree of tremor away.  She expressed understanding.  If we need to add further medication, it would likely be Topamax and we discussed that in detail, along with risks benefits and side effects.  -She has mild vocal tremor.  -We will try to get a copy of records from [facility] neurology.  She signed a record release today.   Subjective:   Kayla Hurley was seen in consultation in the movement disorder clinic at the request of [place], Jimmy Picket*.  The evaluation is for tremor.  Patient previously evaluated and seen at [facility] neurology, Dr. Deon Pilling.  Those records are not available to me today.  Tremor started approximately 5 years ago and involves the bilateral UE, L>R.  Pt is R hand dominant. Tremor is most noticeable when doing things with the hands.     There is a family hx of tremor in her sister and maternal GM.    Affected by caffeine:  Yes.   Affected by alcohol:  Doesn't drink enough to know Affected by stress:  Yes.   Affected by fatigue:  No. Spills soup if on spoon:  Usually puts it in a cup and drinks it Spills glass of liquid if full:  No. Affects ADL's (tying shoes, brushing teeth, etc):  No.  Current/Previously tried tremor medications: Primidone, 50 mg, half  tablet at bedtime ( "it knocks me out" but it helped the tremor at very low dosages - 1/4 tablet); propranolol, 10 mg 3 times per day (BP low); xanax helped it  Current medications that may exacerbate tremor:  n/a  Outside reports reviewed: historical medical records, office notes and referral letter/letters.  Allergies  Allergen Reactions  . Adhesive [Tape] Other (See Comments)    blisters  . Penicillins Diarrhea, Nausea And Vomiting and Rash    Current Outpatient Medications  Medication Instructions  . ALPRAZolam (XANAX) 0.25 mg, Oral, At bedtime PRN  . Calcium Carb-Cholecalciferol (CALCIUM 1000 + D PO) 1 tablet, Oral, 2 times daily  . folic acid (FOLVITE) 1 mg, Oral, Daily  . leucovorin (WELLCOVORIN) 5 mg, Oral, 2 times weekly  . levothyroxine (SYNTHROID) 50 mcg, Oral, Daily before breakfast  . methotrexate (RHEUMATREX) 2.5 mg, Oral, 4 times weekly, Caution:Chemotherapy. Protect from light.   . potassium chloride (KLOR-CON) 20 MEQ packet 20 mEq, Oral, 2 times daily  . salsalate (DISALCID) 750 mg, Oral, 2 times daily  . simethicone (GAS-X) 80 mg, Oral, As needed  . triamterene-hydrochlorothiazide (MAXZIDE) 75-50 MG tablet 0.5 tablets, Oral, Daily     Objective:   VITALS:   Vitals:   03/05/20 1329  BP: 128/69  Pulse: 82  SpO2: 98%  Weight: 153 lb (69.4 kg)  Height: 5' 2.5" (1.588 m)   Gen:  Appears stated age  and in NAD. HEENT:  Normocephalic, atraumatic. The mucous membranes are moist. The superficial temporal arteries are without ropiness or tenderness. Cardiovascular: Regular rate and rhythm. Lungs: Clear to auscultation bilaterally. Neck: There are no carotid bruits noted bilaterally.  NEUROLOGICAL:  Orientation:  The patient is alert and oriented x 3.   Cranial nerves: There is good facial symmetry. Extraocular muscles are intact and visual fields are full to confrontational testing. Speech is fluent and clear.  There is vocal tremor.  Soft palate rises  symmetrically and there is no tongue deviation. Hearing is intact to conversational tone. Tone: Tone is good throughout. Sensation: Sensation is intact to light touch touch throughout (facial, trunk, extremities). Vibration is intact at the bilateral big toe. There is no extinction with double simultaneous stimulation. There is no sensory dermatomal level identified. Coordination:  The patient has no dysdiadichokinesia or dysmetria. Motor: Strength is 5/5 in the bilateral upper and lower extremities.  Shoulder shrug is equal bilaterally.  There is no pronator drift.  There are no fasciculations noted. DTR's: Deep tendon reflexes are 2/4 at the bilateral biceps, triceps, brachioradialis, patella and achilles.  Plantar responses are downgoing bilaterally. Gait and Station: The patient is able to ambulate without difficulty. The patient is able to heel toe walk without any difficulty. The patient is able to ambulate in a tandem fashion. The patient is able to stand in the Romberg position.   MOVEMENT EXAM: Tremor:  There is very little postural tremor, even with the weight.  The biggest issues are when she goes to draw Archimedes spirals bilaterally, left much more than right.  She has trouble pouring water from 1 glass to another when the water is in the left hand, but she does fairly well with it in the right hand.  I have reviewed and interpreted the following labs independently She had lab work on July 05, 2019.  White blood cells were 7.9, hemoglobin 14.3, hematocrit 41.3 and platelets 158.  Sodium was 141, potassium 3.9, chloride 103, CO2 34, BUN 24, creatinine 0.51, AST 26, ALT 18.   Total time spent on today's visit was 60 minutes, including both face-to-face time and nonface-to-face time.  Time included that spent on review of records (prior notes available to me/labs/imaging if pertinent), discussing treatment and goals, answering patient's questions and coordinating care.  CC:   Buzzy Han, MD

## 2020-03-05 ENCOUNTER — Ambulatory Visit (INDEPENDENT_AMBULATORY_CARE_PROVIDER_SITE_OTHER): Payer: Medicare Other | Admitting: Neurology

## 2020-03-05 ENCOUNTER — Encounter: Payer: Self-pay | Admitting: Neurology

## 2020-03-05 ENCOUNTER — Other Ambulatory Visit: Payer: Self-pay

## 2020-03-05 VITALS — BP 128/69 | HR 82 | Ht 62.5 in | Wt 153.0 lb

## 2020-03-05 DIAGNOSIS — G250 Essential tremor: Secondary | ICD-10-CM

## 2020-03-05 NOTE — Patient Instructions (Signed)
Your physician recommends that you schedule a follow-up appointment in: 6 months   The physicians and staff at [facility] are committed to providing excellent care. You may receive a survey requesting feedback about your experience at our office. We strive to receive "very good" responses to the survey questions. If you feel that your experience would prevent you from giving the office a "very good " response, please contact our office to try to remedy the situation. We may be reached at [phone]. Thank you for taking the time out of your busy day to complete the survey.

## 2020-03-06 ENCOUNTER — Telehealth: Payer: Self-pay | Admitting: Neurology

## 2020-03-06 NOTE — Telephone Encounter (Signed)
[place] neurology records.  No further information other than what patient had told me.  Most of their interaction was via video telemedicine.  Patient had sleepiness/vertigo with low-dose primidone.  Patient with low blood pressure with propranolol.

## 2020-03-19 ENCOUNTER — Telehealth: Payer: Self-pay | Admitting: Neurology

## 2020-03-19 NOTE — Telephone Encounter (Signed)
Patient states does not have money to pay for the bracelet. Its cost $2000 down payment and $99 dollars a month for as long as the you keep the bracelet. Patient states she can not afford to pay that. She also states there is only a sixty percent chance the bracelet will help. She states she is not sure she wants to pay this kind of money for something that is not guaranteed. She states she would rather take medicine.  Patient wants to know if there is something else she can take. She takes she is taking propanolol once a day and one fourth of the primidone qd. She states this is helping. Because her quality of life had gotten bad.   Informed patient that Dr Tat is out of the office and she will not get an response until Monday 03/23/2020. Patient voiced understanding.

## 2020-03-19 NOTE — Telephone Encounter (Signed)
Patient called with questions about a medication wrist band she tried to get through [facility]. She said she's not been successful in getting this and she'd like another alternative.  [facility] on [address] in [place]

## 2020-03-20 NOTE — Telephone Encounter (Signed)
Left message for patient to contact office.

## 2020-03-20 NOTE — Telephone Encounter (Signed)
She and I discussed topamax in detail.  If she would like to trial it we will start it at low dose.  Start topamax 25 mg q hs for a week and then 25 mg bid.  Please send in RX.  This is low dose.  She will keep taking her other meds

## 2020-03-23 MED ORDER — TOPIRAMATE 25 MG PO TABS: ORAL_TABLET | ORAL | 0 refills | Status: DC

## 2020-03-23 NOTE — Telephone Encounter (Signed)
Spoke with patient and gave her Dr Doristine Devoid recommendations. She requested I refresh her memory because she has seen so many doctors and forgot her conversation wit Dr Tat. Advised patient that Dr Tat was going to try her on a medication since the caro bracelet did not work. She voiced that she remembered calling and states she will review on google the side effects of the medication before she takes it.  Advised her that I would send the rx to the pharmacy and its her choice if she takes the medication or not.   She voiced understanding.

## 2020-04-02 NOTE — Progress Notes (Signed)
[facility] Note  04/07/2020     CHIEF COMPLAINT Patient presents for Retina Follow Up   HISTORY OF PRESENT ILLNESS: Kayla Hurley is a 85 y.o. female who presents to the clinic today for:   HPI    Retina Follow Up    Severity is moderate.  Duration of 5 weeks.  Since onset it is stable.  I, the attending physician,  performed the HPI with the patient and updated documentation appropriately.          Comments    5 weeks Retina Eval for Exu Armd od. Patient states vision is about the same. Patient saw Dr. Kathlen Mody yesterday and he told patient to start using the Latanoprost in the right eye also.       Last edited by Bernarda Caffey, MD on 04/08/2020 12:37 AM. (History)       Referring physician: Buzzy Han, MD [place],  [place] [id number]  HISTORICAL INFORMATION:   Selected notes from the MEDICAL RECORD NUMBER Referred by Dr. Kathlen Mody for concern of exu ARMD OD   CURRENT MEDICATIONS: No current outpatient medications on file. (Ophthalmic Drugs)   No current facility-administered medications for this visit. (Ophthalmic Drugs)   Current Outpatient Medications (Other)  Medication Sig  . ALPRAZolam (XANAX) 0.25 MG tablet Take 0.25 mg by mouth at bedtime as needed for anxiety.  . Calcium Carb-Cholecalciferol (CALCIUM 1000 + D PO) Take 1 tablet by mouth in the morning and at bedtime.  . folic acid (FOLVITE) 1 MG tablet Take 1 mg by mouth daily.  Marland Kitchen leucovorin (WELLCOVORIN) 5 MG tablet Take 5 mg by mouth 2 (two) times a week.  . levothyroxine (SYNTHROID) 50 MCG tablet Take 50 mcg by mouth daily before breakfast.  . methotrexate (RHEUMATREX) 2.5 MG tablet Take 2.5 mg by mouth 4 (four) times a week. Caution:Chemotherapy. Protect from light.  . potassium chloride (KLOR-CON) 20 MEQ packet Take 20 mEq by mouth 2 (two) times daily.  . salsalate (DISALCID) 750 MG tablet Take 750 mg by mouth 2 (two) times daily.  . simethicone  (GAS-X) 80 MG chewable tablet Chew 80 mg by mouth as needed for flatulence.  . topiramate (TOPAMAX) 25 MG tablet Take 1 tablet (25 mg total) by mouth daily for 7 days, THEN 1 tablet (25 mg total) 2 (two) times daily.  Marland Kitchen triamterene-hydrochlorothiazide (MAXZIDE) 75-50 MG tablet Take 0.5 tablets by mouth daily.   No current facility-administered medications for this visit. (Other)      REVIEW OF SYSTEMS: ROS    Positive for: Musculoskeletal, Endocrine, Eyes   Negative for: Constitutional, Gastrointestinal, Neurological, Skin, Genitourinary, HENT, Cardiovascular, Respiratory, Psychiatric, Allergic/Imm, Heme/Lymph   Last edited by Elmore Guise, COT on 04/07/2020  9:56 AM. (History)       ALLERGIES Allergies  Allergen Reactions  . Adhesive [Tape] Other (See Comments)    blisters  . Penicillins Diarrhea, Nausea And Vomiting and Rash    PAST MEDICAL HISTORY Past Medical History:  Diagnosis Date  . COPD (chronic obstructive pulmonary disease) ([place])   . Glaucoma   . Macular degeneration   . Meningioma ([place])   . Rheumatoid arthritis [facility])    Past Surgical History:  Procedure Laterality Date  . APPENDECTOMY    . CESAREAN SECTION     x 3  . CHOLECYSTECTOMY    . CRANIECTOMY / CRANIOTOMY FOR EXCISION OF BRAIN TUMOR     meningioma  . EYE SURGERY    .  HYSTEROSCOPY WITH D & C    . INGUINAL HERNIA REPAIR    . YAG LASER APPLICATION      FAMILY HISTORY Family History  Problem Relation Age of Onset  . Other Mother        died during open heart surgery  . Heart disease Father   . Diabetes Son     SOCIAL HISTORY Social History   Tobacco Use  . Smoking status: Former Research scientist (life sciences)  . Smokeless tobacco: Never Used  Vaping Use  . Vaping Use: Never used  Substance Use Topics  . Alcohol use: Not on file    Comment: occasional  . Drug use: Not on file         OPHTHALMIC EXAM:  Base Eye Exam    Visual Acuity (Snellen - Linear)      Right Left   Dist cc 20/20-1 20/60+1    Dist ph cc  20/50-2   Correction: Glasses       Tonometry (Tonopen, 9:57 AM)      Right Left   Pressure 14 11       Pupils      Dark Light Shape React APD   Right 3 2 Round Brisk None   Left 3 2 Round Brisk None       Visual Fields (Counting fingers)      Left Right    Full Full       Extraocular Movement      Right Left    Full, Ortho Full, Ortho       Neuro/Psych    Oriented x3: Yes   Mood/Affect: Normal       Dilation    Both eyes: 1.0% Mydriacyl, 2.5% Phenylephrine @ 9:58 AM        Slit Lamp and Fundus Exam    Slit Lamp Exam      Right Left   Lids/Lashes Dermato Dermato, mild MGD   Conjunctiva/Sclera White and quiet White and quiet   Cornea Mild arcus, trace PEE, mild tear film debris 2-3+ PEE, mild tear film debris   Anterior Chamber Deep and quiet Deep and quiet   Iris Round and dilated Round and dilated   Lens PCIOL in excellent position, open pc PCIOL in excellent position   Vitreous syneresis syneresis       Fundus Exam      Right Left   Disc Pink, sharp, +cupping and central pallor, mild temporal PPA Pink and sharp, +cupping   C/D Ratio 0.65 0.6   Macula Blunted foveal reflex, drusen, RPE mottling, clumping and atrophy, +CNV and shallow SRF - stably improved, no heme Drusen, RPE mottling, clumping and early atrophy, No heme or edema   Vessels Attenuated and mild tortuosity Attenuated and mild tortuoisty   Periphery Attached, mild peripheral drusen, mild reticular degeneration, focal paving stone inferiorly Attached, mild peripheral drusen and mild reticular degeneration        Refraction    Wearing Rx      Sphere Cylinder Axis Add   Right -0.50 +1.75 010 +2.50   Left -2.50 +2.00 175 +2.50          IMAGING AND PROCEDURES  Imaging and Procedures for 04/07/2020  OCT, Retina - OU - Both Eyes       Right Eye Quality was good. Central Foveal Thickness: 287. Progression has been stable. Findings include no IRF, outer retinal atrophy,  retinal drusen , no SRF, pigment epithelial detachment, normal foveal contour (Stable improvement in PEDs and  overlying SRHM/SRF).   Left Eye Quality was good. Central Foveal Thickness: 272. Progression has been stable. Findings include normal foveal contour, no IRF, no SRF, retinal drusen , outer retinal atrophy.   Notes *Images captured and stored on drive  Diagnosis / Impression:  OD: exudative ARMD -- stable improvement in PEDs and overlying SRHM/SRF OS: nonexudative ARMD;  NFP, no IRF/SRF  Clinical management:  See below  Abbreviations: NFP - Normal foveal profile. CME - cystoid macular edema. PED - pigment epithelial detachment. IRF - intraretinal fluid. SRF - subretinal fluid. EZ - ellipsoid zone. ERM - epiretinal membrane. ORA - outer retinal atrophy. ORT - outer retinal tubulation. SRHM - subretinal hyper-reflective material. IRHM - intraretinal hyper-reflective material        Intravitreal Injection, Pharmacologic Agent - OD - Right Eye       Time Out 04/07/2020. 11:02 AM. Confirmed correct patient, procedure, site, and patient consented.   Anesthesia Topical anesthesia was used. Anesthetic medications included Lidocaine 2%, Proparacaine 0.5%.   Procedure Preparation included 5% betadine to ocular surface, eyelid speculum. A (32g) needle was used.   Injection:  1.25 mg Bevacizumab (AVASTIN) SOLN   NDC: 74944-967-59, Lot: [id number], Expiration date: 05/22/2020   Route: Intravitreal, Site: Right Eye, Waste: 0.05 mL  Post-op Post injection exam found visual acuity of at least counting fingers. The patient tolerated the procedure well. There were no complications. The patient received written and verbal post procedure care education. Post injection medications were not given.                 ASSESSMENT/PLAN:    ICD-10-CM   1. Exudative age-related macular degeneration of right eye with active choroidal neovascularization (HCC)  H35.3211 Intravitreal Injection,  Pharmacologic Agent - OD - Right Eye    Bevacizumab (AVASTIN) SOLN 1.25 mg  2. Retinal edema  H35.81 OCT, Retina - OU - Both Eyes  3. Intermediate stage nonexudative age-related macular degeneration of left eye  H35.3122   4. Amblyopia of eye, left  H53.002   5. Primary open angle glaucoma (POAG) of both eyes, moderate stage  H40.1132   6. Essential hypertension  I10   7. Hypertensive retinopathy of both eyes  H35.033   8. Pseudophakia of both eyes  Z96.1   9. Dry eyes, bilateral  H04.123     1,2. Exudative age related macular degeneration, OD  - OCT at presentation 07.29.21 showed CNVM w/shallow SRF OD  - FA 8.30.21 shows focal CNVM             - s/p IVA OD # 1 (07.29.21), #2 (08.30.21), #3 (09.27.21)  - excellent response  - BCVA 20/20 (stable)  - OCT shows shows stable improvement in PEDs and overly SRHM/SRF OD at 5 wks  - recommend IVA OD #4 today, 11.02.21 -- maintenance w/ extension to 6 wks - pt wishes to be treated with IVA OD - RBA of procedure discussed, questions answered - informed consent obtained and signed - see procedure note - discussed very slow tx and extend plan due to functional monocular status (OS amblyopia, 20/60)  - f/u in 6 wks -- DFE/OCT/possible injection  3. Age related macular degeneration, non-exudative, OS  - The incidence, anatomy, and pathology of dry AMD, risk of progression, and the AREDS and AREDS 2 studies including smoking risks discussed with patient.  - Recommend amsler grid monitoring  4. Amblyopia OS  - long standing  - monitor  5. POAG OU  - under the expert management  of Dr. Kathlen Mody  - IOP 14,11  6,7. Hypertensive retinopathy OU - discussed importance of tight BP control - monitor  8. Pseudophakia OU  - s/p CE/IOL  - IOL in good position, doing well  - monitor  9. Dry eyes OU - recommend artificial tears and lubricating ointment as needed  Ophthalmic Meds Ordered this visit:  Meds ordered this encounter  Medications  .  Bevacizumab (AVASTIN) SOLN 1.25 mg       Return in about 6 weeks (around 05/19/2020) for f/u exu ARMD OD, DFE, OCT.  There are no Patient Instructions on file for this visit.  This document serves as a record of services personally performed by Gardiner Sleeper, MD, PhD. It was created on their behalf by Estill Bakes, COT an ophthalmic technician. The creation of this record is the provider's dictation and/or activities during the visit.    Electronically signed by: Estill Bakes, COT 10.28.21 @ 12:40 AM   This document serves as a record of services personally performed by Gardiner Sleeper, MD, PhD. It was created on their behalf by San Jetty. Owens Shark, OA an ophthalmic technician. The creation of this record is the provider's dictation and/or activities during the visit.    Electronically signed by: San Jetty. Marguerita Merles 11.02.2021 12:40 AM  Gardiner Sleeper, M.D., Ph.D. Diseases & Surgery of the Retina and [place] 04/07/2020   I have reviewed the above documentation for accuracy and completeness, and I agree with the above. Gardiner Sleeper, M.D., Ph.D. 04/08/20 12:40 AM   Abbreviations: M myopia (nearsighted); A astigmatism; H hyperopia (farsighted); P presbyopia; Mrx spectacle prescription;  CTL contact lenses; OD right eye; OS left eye; OU both eyes  XT exotropia; ET esotropia; PEK punctate epithelial keratitis; PEE punctate epithelial erosions; DES dry eye syndrome; MGD meibomian gland dysfunction; ATs artificial tears; PFAT's preservative free artificial tears; [place] nuclear sclerotic cataract; PSC posterior subcapsular cataract; ERM epi-retinal membrane; PVD posterior vitreous detachment; RD retinal detachment; DM diabetes mellitus; DR diabetic retinopathy; NPDR non-proliferative diabetic retinopathy; PDR proliferative diabetic retinopathy; CSME clinically significant macular edema; DME diabetic macular edema; dbh dot blot hemorrhages; CWS cotton wool spot;  POAG primary open angle glaucoma; C/D cup-to-disc ratio; HVF humphrey visual field; GVF goldmann visual field; OCT optical coherence tomography; IOP intraocular pressure; BRVO Branch retinal vein occlusion; CRVO central retinal vein occlusion; CRAO central retinal artery occlusion; BRAO branch retinal artery occlusion; RT retinal tear; SB scleral buckle; PPV pars plana vitrectomy; VH Vitreous hemorrhage; PRP panretinal laser photocoagulation; IVK intravitreal kenalog; VMT vitreomacular traction; MH Macular hole;  NVD neovascularization of the disc; NVE neovascularization elsewhere; AREDS age related eye disease study; ARMD age related macular degeneration; POAG primary open angle glaucoma; EBMD epithelial/anterior basement membrane dystrophy; ACIOL anterior chamber intraocular lens; IOL intraocular lens; PCIOL posterior chamber intraocular lens; Phaco/IOL phacoemulsification with intraocular lens placement; [place] photorefractive keratectomy; LASIK laser assisted in situ keratomileusis; HTN hypertension; DM diabetes mellitus; COPD chronic obstructive pulmonary disease

## 2020-04-07 ENCOUNTER — Ambulatory Visit (INDEPENDENT_AMBULATORY_CARE_PROVIDER_SITE_OTHER): Payer: Medicare Other | Admitting: Ophthalmology

## 2020-04-07 ENCOUNTER — Encounter (INDEPENDENT_AMBULATORY_CARE_PROVIDER_SITE_OTHER): Payer: Self-pay | Admitting: Ophthalmology

## 2020-04-07 ENCOUNTER — Other Ambulatory Visit: Payer: Self-pay

## 2020-04-07 DIAGNOSIS — H04123 Dry eye syndrome of bilateral lacrimal glands: Secondary | ICD-10-CM

## 2020-04-07 DIAGNOSIS — H401132 Primary open-angle glaucoma, bilateral, moderate stage: Secondary | ICD-10-CM

## 2020-04-07 DIAGNOSIS — I10 Essential (primary) hypertension: Secondary | ICD-10-CM

## 2020-04-07 DIAGNOSIS — H353122 Nonexudative age-related macular degeneration, left eye, intermediate dry stage: Secondary | ICD-10-CM | POA: Diagnosis not present

## 2020-04-07 DIAGNOSIS — H3581 Retinal edema: Secondary | ICD-10-CM

## 2020-04-07 DIAGNOSIS — H53002 Unspecified amblyopia, left eye: Secondary | ICD-10-CM | POA: Diagnosis not present

## 2020-04-07 DIAGNOSIS — Z961 Presence of intraocular lens: Secondary | ICD-10-CM

## 2020-04-07 DIAGNOSIS — H35033 Hypertensive retinopathy, bilateral: Secondary | ICD-10-CM

## 2020-04-07 DIAGNOSIS — H353211 Exudative age-related macular degeneration, right eye, with active choroidal neovascularization: Secondary | ICD-10-CM

## 2020-04-08 ENCOUNTER — Encounter (INDEPENDENT_AMBULATORY_CARE_PROVIDER_SITE_OTHER): Payer: Self-pay | Admitting: Ophthalmology

## 2020-04-08 DIAGNOSIS — H353211 Exudative age-related macular degeneration, right eye, with active choroidal neovascularization: Secondary | ICD-10-CM

## 2020-04-08 MED ORDER — BEVACIZUMAB CHEMO INJECTION 1.25MG/0.05ML SYRINGE FOR KALEIDOSCOPE
1.2500 mg | INTRAVITREAL | Status: AC | PRN
  Administered 2020-04-08: 1.25 mg via INTRAVITREAL

## 2020-05-14 NOTE — Progress Notes (Signed)
[facility] Note  05/18/2020     CHIEF COMPLAINT Patient presents for Retina Follow Up   HISTORY OF PRESENT ILLNESS: Kayla Hurley is a 85 y.o. female who presents to the clinic today for:   HPI    Retina Follow Up    Patient presents with  Wet AMD.  In right eye.  Duration of 6 weeks.  Since onset it is stable.  I, the attending physician,  performed the HPI with the patient and updated documentation appropriately.          Comments    6 week follow up Exu ARMD OD-  Vision stable OU.  Eyes are really dry. Using Refresh prn and Latanoprost qhs OU.       Last edited by Bernarda Caffey, MD on 05/18/2020  1:18 PM. (History)       Referring physician: Buzzy Han, MD [place],  [place] [address]  HISTORICAL INFORMATION:   Selected notes from the MEDICAL RECORD NUMBER Referred by Dr. Kathlen Mody for concern of exu ARMD OD   CURRENT MEDICATIONS: No current outpatient medications on file. (Ophthalmic Drugs)   No current facility-administered medications for this visit. (Ophthalmic Drugs)   Current Outpatient Medications (Other)  Medication Sig  . ALPRAZolam (XANAX) 0.25 MG tablet Take 0.25 mg by mouth at bedtime as needed for anxiety.  . Calcium Carb-Cholecalciferol (CALCIUM 1000 + D PO) Take 1 tablet by mouth in the morning and at bedtime.  . folic acid (FOLVITE) 1 MG tablet Take 1 mg by mouth daily.  Marland Kitchen leucovorin (WELLCOVORIN) 5 MG tablet Take 5 mg by mouth 2 (two) times a week.  . levothyroxine (SYNTHROID) 50 MCG tablet Take 50 mcg by mouth daily before breakfast.  . methotrexate (RHEUMATREX) 2.5 MG tablet Take 2.5 mg by mouth 4 (four) times a week. Caution:Chemotherapy. Protect from light.  . potassium chloride (KLOR-CON) 20 MEQ packet Take 20 mEq by mouth 2 (two) times daily.  . salsalate (DISALCID) 750 MG tablet Take 750 mg by mouth 2 (two) times daily.  . simethicone (MYLICON) 80 MG chewable tablet Chew 80 mg by  mouth as needed for flatulence.  . triamterene-hydrochlorothiazide (MAXZIDE) 75-50 MG tablet Take 0.5 tablets by mouth daily.  Marland Kitchen topiramate (TOPAMAX) 25 MG tablet Take 1 tablet (25 mg total) by mouth daily for 7 days, THEN 1 tablet (25 mg total) 2 (two) times daily.   No current facility-administered medications for this visit. (Other)      REVIEW OF SYSTEMS: ROS    Positive for: Musculoskeletal, Endocrine, Eyes   Negative for: Constitutional, Gastrointestinal, Neurological, Skin, Genitourinary, HENT, Cardiovascular, Respiratory, Psychiatric, Allergic/Imm, Heme/Lymph   Last edited by Leonie Douglas, COA on 05/18/2020  9:28 AM. (History)       ALLERGIES Allergies  Allergen Reactions  . Adhesive [Tape] Other (See Comments)    blisters  . Penicillins Diarrhea, Nausea And Vomiting and Rash    PAST MEDICAL HISTORY Past Medical History:  Diagnosis Date  . COPD (chronic obstructive pulmonary disease) ([place])   . Glaucoma   . Macular degeneration   . Meningioma ([place])   . Rheumatoid arthritis [facility])    Past Surgical History:  Procedure Laterality Date  . APPENDECTOMY    . CESAREAN SECTION     x 3  . CHOLECYSTECTOMY    . CRANIECTOMY / CRANIOTOMY FOR EXCISION OF BRAIN TUMOR     meningioma  . EYE SURGERY    . HYSTEROSCOPY WITH D &  C    . INGUINAL HERNIA REPAIR    . YAG LASER APPLICATION      FAMILY HISTORY Family History  Problem Relation Age of Onset  . Other Mother        died during open heart surgery  . Heart disease Father   . Diabetes Son     SOCIAL HISTORY Social History   Tobacco Use  . Smoking status: Former Research scientist (life sciences)  . Smokeless tobacco: Never Used  Vaping Use  . Vaping Use: Never used         OPHTHALMIC EXAM:  Base Eye Exam    Visual Acuity (Snellen - Linear)      Right Left   Dist cc 20/20 -1 20/50 -2   Dist ph cc  NI   Correction: Glasses       Tonometry (Tonopen, 9:35 AM)      Right Left   Pressure 16 17       Pupils      Dark Light  Shape React APD   Right 3 2 Round Brisk None   Left 3 2 Round Brisk None       Visual Fields (Counting fingers)      Left Right    Full Full       Extraocular Movement      Right Left    Full Full       Neuro/Psych    Oriented x3: Yes   Mood/Affect: Normal       Dilation    Both eyes: 1.0% Mydriacyl, 2.5% Phenylephrine @ 9:37 AM        Slit Lamp and Fundus Exam    Slit Lamp Exam      Right Left   Lids/Lashes Dermato Dermato, mild MGD   Conjunctiva/Sclera White and quiet White and quiet   Cornea Mild arcus, trace PEE, mild tear film debris 2-3+ PEE, mild tear film debris   Anterior Chamber Deep and quiet Deep and quiet   Iris Round and dilated Round and dilated   Lens PCIOL in excellent position, open pc PCIOL in excellent position   Vitreous syneresis syneresis       Fundus Exam      Right Left   Disc Pink, sharp, +cupping and central pallor, mild temporal PPA Pink and sharp, +cupping   C/D Ratio 0.65 0.6   Macula Blunted foveal reflex, drusen, RPE mottling, clumping and atrophy, +CNV and shallow SRF - stably improved, no heme Flat, blunted foveal reflex, Drusen, RPE mottling, clumping and early atrophy, No heme or edema   Vessels Attenuated and mild tortuosity Attenuated and mild tortuoisty   Periphery Attached, mild peripheral drusen, mild reticular degeneration, focal paving stone inferiorly Attached, mild peripheral drusen and mild reticular degeneration, paving stone inferiorly        Refraction    Wearing Rx      Sphere Cylinder Axis Add   Right -0.50 +1.75 010 +2.50   Left -2.50 +2.00 175 +2.50          IMAGING AND PROCEDURES  Imaging and Procedures for 05/18/2020  OCT, Retina - OU - Both Eyes       Right Eye Quality was good. Central Foveal Thickness: 289. Progression has been stable. Findings include no IRF, outer retinal atrophy, retinal drusen , no SRF, pigment epithelial detachment, normal foveal contour (Stable improvement in cystic  changes/IRF and overlying SRHM/SRF).   Left Eye Quality was good. Central Foveal Thickness: 272. Progression has been stable. Findings include  normal foveal contour, no IRF, no SRF, retinal drusen , outer retinal atrophy.   Notes *Images captured and stored on drive  Diagnosis / Impression:  OD: exudative ARMD -- stable improvement in cystic changes, PEDs and overlying SRHM/SRF OS: nonexudative ARMD;  NFP, no IRF/SRF  Clinical management:  See below  Abbreviations: NFP - Normal foveal profile. CME - cystoid macular edema. PED - pigment epithelial detachment. IRF - intraretinal fluid. SRF - subretinal fluid. EZ - ellipsoid zone. ERM - epiretinal membrane. ORA - outer retinal atrophy. ORT - outer retinal tubulation. SRHM - subretinal hyper-reflective material. IRHM - intraretinal hyper-reflective material        Intravitreal Injection, Pharmacologic Agent - OD - Right Eye       Time Out 05/18/2020. 9:50 AM. Confirmed correct patient, procedure, site, and patient consented.   Anesthesia Topical anesthesia was used. Anesthetic medications included Lidocaine 2%, Proparacaine 0.5%.   Procedure Preparation included 5% betadine to ocular surface, eyelid speculum. A supplied needle was used.   Injection:  1.25 mg Bevacizumab (AVASTIN) 1.25mg /0.15mL SOLN   NDC: 16109-604-54, Lot: [id number] , Expiration date: 07/15/2020   Route: Intravitreal, Site: Right Eye, Waste: 0 mL  Post-op Post injection exam found visual acuity of at least counting fingers. The patient tolerated the procedure well. There were no complications. The patient received written and verbal post procedure care education. Post injection medications were not given.                 ASSESSMENT/PLAN:    ICD-10-CM   1. Exudative age-related macular degeneration of right eye with active choroidal neovascularization (HCC)  H35.3211 Intravitreal Injection, Pharmacologic Agent - OD - Right Eye    Bevacizumab (AVASTIN)  SOLN 1.25 mg  2. Retinal edema  H35.81 OCT, Retina - OU - Both Eyes  3. Intermediate stage nonexudative age-related macular degeneration of left eye  H35.3122   4. Amblyopia of eye, left  H53.002   5. Primary open angle glaucoma (POAG) of both eyes, moderate stage  H40.1132   6. Essential hypertension  I10   7. Hypertensive retinopathy of both eyes  H35.033   8. Pseudophakia of both eyes  Z96.1   9. Dry eyes, bilateral  H04.123     1,2. Exudative age related macular degeneration, OD  - OCT at presentation 07.29.21 showed CNVM w/shallow SRF OD  - FA 8.30.21 shows focal CNVM             - s/p IVA OD # 1 (07.29.21), #2 (08.30.21), #3 (09.27.21), #4 (11.02.21)  - excellent response  - BCVA 20/20 (stable)  - OCT shows shows stable improvement in cystic changes, PEDs and overly SRHM/SRF OD at 6 wks  - recommend IVA OD #5 today, 12.13.21 -- maintenance w/ extension to 7 wks - pt wishes to be treated with IVA OD - RBA of procedure discussed, questions answered - informed consent obtained and signed - see procedure note - discussed very slow tx and extend plan due to functional monocular status (OS amblyopia, 20/60)  - f/u in 7 wks -- DFE/OCT/possible injection  3. Age related macular degeneration, non-exudative, OS  - The incidence, anatomy, and pathology of dry AMD, risk of progression, and the AREDS and AREDS 2 studies including smoking risks discussed with patient.  - Recommend amsler grid monitoring  4. Amblyopia OS  - long standing  - monitor  5. POAG OU  - under the expert management of Dr. 12.26.21  - IOP 16,17  6,7. Hypertensive retinopathy OU -  discussed importance of tight BP control - monitor  8. Pseudophakia OU  - s/p CE/IOL  - IOL in good position, doing well  - monitor  9. Dry eyes OU - recommend artificial tears and lubricating ointment as needed  Ophthalmic Meds Ordered this visit:  Meds ordered this encounter  Medications  . Bevacizumab (AVASTIN) SOLN 1.25  mg       Return in about 7 weeks (around 07/06/2020) for f/u exu ARMD OD, DFE, OCT.  There are no Patient Instructions on file for this visit.  This document serves as a record of services personally performed by Gardiner Sleeper, MD, PhD. It was created on their behalf by Leeann Must, [place], an ophthalmic technician. The creation of this record is the provider's dictation and/or activities during the visit.    Electronically signed by: Leeann Must, [place] 12.09.2021 1:25 PM   This document serves as a record of services personally performed by Gardiner Sleeper, MD, PhD. It was created on their behalf by San Jetty. Owens Shark, OA an ophthalmic technician. The creation of this record is the provider's dictation and/or activities during the visit.    Electronically signed by: San Jetty. Owens Shark, [place] 12.13.2021 1:25 PM  Gardiner Sleeper, M.D., Ph.D. Diseases & Surgery of the Retina and [place] 05/18/2020    I have reviewed the above documentation for accuracy and completeness, and I agree with the above. Gardiner Sleeper, M.D., Ph.D. 05/18/20 1:25 PM   Abbreviations: M myopia (nearsighted); A astigmatism; H hyperopia (farsighted); P presbyopia; Mrx spectacle prescription;  CTL contact lenses; OD right eye; OS left eye; OU both eyes  XT exotropia; ET esotropia; PEK punctate epithelial keratitis; PEE punctate epithelial erosions; DES dry eye syndrome; MGD meibomian gland dysfunction; ATs artificial tears; PFAT's preservative free artificial tears; [place] nuclear sclerotic cataract; PSC posterior subcapsular cataract; ERM epi-retinal membrane; PVD posterior vitreous detachment; RD retinal detachment; DM diabetes mellitus; DR diabetic retinopathy; NPDR non-proliferative diabetic retinopathy; PDR proliferative diabetic retinopathy; CSME clinically significant macular edema; DME diabetic macular edema; dbh dot blot hemorrhages; CWS cotton wool spot; POAG primary open angle  glaucoma; C/D cup-to-disc ratio; HVF humphrey visual field; GVF goldmann visual field; OCT optical coherence tomography; IOP intraocular pressure; BRVO Branch retinal vein occlusion; CRVO central retinal vein occlusion; CRAO central retinal artery occlusion; BRAO branch retinal artery occlusion; RT retinal tear; SB scleral buckle; PPV pars plana vitrectomy; VH Vitreous hemorrhage; PRP panretinal laser photocoagulation; IVK intravitreal kenalog; VMT vitreomacular traction; MH Macular hole;  NVD neovascularization of the disc; NVE neovascularization elsewhere; AREDS age related eye disease study; ARMD age related macular degeneration; POAG primary open angle glaucoma; EBMD epithelial/anterior basement membrane dystrophy; ACIOL anterior chamber intraocular lens; IOL intraocular lens; PCIOL posterior chamber intraocular lens; Phaco/IOL phacoemulsification with intraocular lens placement; [place] photorefractive keratectomy; LASIK laser assisted in situ keratomileusis; HTN hypertension; DM diabetes mellitus; COPD chronic obstructive pulmonary disease

## 2020-05-18 ENCOUNTER — Encounter (INDEPENDENT_AMBULATORY_CARE_PROVIDER_SITE_OTHER): Payer: Self-pay | Admitting: Ophthalmology

## 2020-05-18 ENCOUNTER — Ambulatory Visit (INDEPENDENT_AMBULATORY_CARE_PROVIDER_SITE_OTHER): Payer: Medicare Other | Admitting: Ophthalmology

## 2020-05-18 ENCOUNTER — Other Ambulatory Visit: Payer: Self-pay

## 2020-05-18 DIAGNOSIS — H353122 Nonexudative age-related macular degeneration, left eye, intermediate dry stage: Secondary | ICD-10-CM | POA: Diagnosis not present

## 2020-05-18 DIAGNOSIS — H53002 Unspecified amblyopia, left eye: Secondary | ICD-10-CM | POA: Diagnosis not present

## 2020-05-18 DIAGNOSIS — H35033 Hypertensive retinopathy, bilateral: Secondary | ICD-10-CM

## 2020-05-18 DIAGNOSIS — H353211 Exudative age-related macular degeneration, right eye, with active choroidal neovascularization: Secondary | ICD-10-CM

## 2020-05-18 DIAGNOSIS — H3581 Retinal edema: Secondary | ICD-10-CM | POA: Diagnosis not present

## 2020-05-18 DIAGNOSIS — H401132 Primary open-angle glaucoma, bilateral, moderate stage: Secondary | ICD-10-CM

## 2020-05-18 DIAGNOSIS — I10 Essential (primary) hypertension: Secondary | ICD-10-CM

## 2020-05-18 DIAGNOSIS — H04123 Dry eye syndrome of bilateral lacrimal glands: Secondary | ICD-10-CM

## 2020-05-18 DIAGNOSIS — Z961 Presence of intraocular lens: Secondary | ICD-10-CM

## 2020-05-18 MED ORDER — BEVACIZUMAB CHEMO INJECTION 1.25MG/0.05ML SYRINGE FOR KALEIDOSCOPE
1.2500 mg | INTRAVITREAL | Status: AC | PRN
  Administered 2020-05-18: 1.25 mg via INTRAVITREAL

## 2020-07-02 NOTE — Progress Notes (Signed)
[facility] Note  07/06/2020     CHIEF COMPLAINT Patient presents for Retina Follow Up   HISTORY OF PRESENT ILLNESS: Kayla Hurley is a 86 y.o. female who presents to the clinic today for:   HPI    Retina Follow Up    Patient presents with  Wet AMD.  In right eye.  This started days ago.  Severity is moderate.  Duration of weeks.  Since onset it is stable.  I, the attending physician,  performed the HPI with the patient and updated documentation appropriately.          Comments    Pt states vision is the same in the distance.  Having a hard time reading small print.  Pt denies eye pain or discomfort.  Pt had more floaters OD, following last injection.  Patient reports Hx of flashes of light OU and states they are persistent and have been since cataract surgery.       Last edited by Bernarda Caffey, MD on 07/06/2020 12:20 PM. (History)       Referring physician: Buzzy Han, MD [place],  [place] [address]  HISTORICAL INFORMATION:   Selected notes from the MEDICAL RECORD NUMBER Referred by Dr. Kathlen Mody for concern of exu ARMD OD   CURRENT MEDICATIONS: No current outpatient medications on file. (Ophthalmic Drugs)   No current facility-administered medications for this visit. (Ophthalmic Drugs)   Current Outpatient Medications (Other)  Medication Sig  . ALPRAZolam (XANAX) 0.25 MG tablet Take 0.25 mg by mouth at bedtime as needed for anxiety.  . Calcium Carb-Cholecalciferol (CALCIUM 1000 + D PO) Take 1 tablet by mouth in the morning and at bedtime.  . folic acid (FOLVITE) 1 MG tablet Take 1 mg by mouth daily.  Marland Kitchen leucovorin (WELLCOVORIN) 5 MG tablet Take 5 mg by mouth 2 (two) times a week.  . levothyroxine (SYNTHROID) 50 MCG tablet Take 50 mcg by mouth daily before breakfast.  . methotrexate (RHEUMATREX) 2.5 MG tablet Take 2.5 mg by mouth 4 (four) times a week. Caution:Chemotherapy. Protect from light.  . potassium  chloride (KLOR-CON) 20 MEQ packet Take 20 mEq by mouth 2 (two) times daily.  . salsalate (DISALCID) 750 MG tablet Take 750 mg by mouth 2 (two) times daily.  . simethicone (MYLICON) 80 MG chewable tablet Chew 80 mg by mouth as needed for flatulence.  . topiramate (TOPAMAX) 25 MG tablet Take 1 tablet (25 mg total) by mouth daily for 7 days, THEN 1 tablet (25 mg total) 2 (two) times daily.  Marland Kitchen triamterene-hydrochlorothiazide (MAXZIDE) 75-50 MG tablet Take 0.5 tablets by mouth daily.   No current facility-administered medications for this visit. (Other)      REVIEW OF SYSTEMS: ROS    Positive for: Musculoskeletal, Endocrine, Eyes   Negative for: Constitutional, Gastrointestinal, Neurological, Skin, Genitourinary, HENT, Cardiovascular, Respiratory, Psychiatric, Allergic/Imm, Heme/Lymph   Last edited by Doneen Poisson on 07/06/2020  9:58 AM. (History)       ALLERGIES Allergies  Allergen Reactions  . Adhesive [Tape] Other (See Comments)    blisters  . Penicillins Diarrhea, Nausea And Vomiting and Rash    PAST MEDICAL HISTORY Past Medical History:  Diagnosis Date  . COPD (chronic obstructive pulmonary disease) ([place])   . Glaucoma   . Macular degeneration   . Meningioma ([place])   . Rheumatoid arthritis [facility])    Past Surgical History:  Procedure Laterality Date  . APPENDECTOMY    . CESAREAN SECTION  x 3  . CHOLECYSTECTOMY    . CRANIECTOMY / CRANIOTOMY FOR EXCISION OF BRAIN TUMOR     meningioma  . EYE SURGERY    . HYSTEROSCOPY WITH D & C    . INGUINAL HERNIA REPAIR    . YAG LASER APPLICATION      FAMILY HISTORY Family History  Problem Relation Age of Onset  . Other Mother        died during open heart surgery  . Heart disease Father   . Diabetes Son     SOCIAL HISTORY Social History   Tobacco Use  . Smoking status: Former Research scientist (life sciences)  . Smokeless tobacco: Never Used  Vaping Use  . Vaping Use: Never used         OPHTHALMIC EXAM:  Base Eye Exam    Visual  Acuity (Snellen - Linear)      Right Left   Dist cc 20/20 -1 20/60 +2   Dist ph cc  20/50 -2   Correction: Glasses       Tonometry (Tonopen, 10:02 AM)      Right Left   Pressure 17 20       Pupils      Dark Light Shape React APD   Right 3 2 Round Brisk 0   Left 3 2 Round Brisk 0       Visual Fields      Left Right    Full Full       Extraocular Movement      Right Left    Full Full       Neuro/Psych    Oriented x3: Yes   Mood/Affect: Normal       Dilation    Both eyes: 1.0% Mydriacyl, 2.5% Phenylephrine @ 10:03 AM        Slit Lamp and Fundus Exam    Slit Lamp Exam      Right Left   Lids/Lashes Dermato Dermato, mild MGD   Conjunctiva/Sclera White and quiet White and quiet   Cornea Mild arcus, trace PEE, mild tear film debris 2-3+ PEE, mild tear film debris   Anterior Chamber Deep and quiet Deep and quiet   Iris Round and dilated Round and dilated   Lens PCIOL in excellent position, open pc PCIOL in excellent position   Vitreous syneresis syneresis       Fundus Exam      Right Left   Disc Pink, sharp, +cupping and central pallor, mild temporal PPA Pink and sharp, +cupping   C/D Ratio 0.65 0.6   Macula Blunted foveal reflex, drusen, RPE mottling, clumping and atrophy, +CNV and shallow SRF - stably improved, no heme Flat, blunted foveal reflex, Drusen, RPE mottling, clumping and early atrophy, No heme or edema   Vessels Attenuated and mild tortuosity Attenuated and mild tortuoisty   Periphery Attached, mild peripheral drusen, mild reticular degeneration, focal paving stone inferiorly Attached, mild peripheral drusen and mild reticular degeneration, paving stone inferiorly        Refraction    Wearing Rx      Sphere Cylinder Axis Add   Right -0.50 +1.75 010 +2.50   Left -2.50 +2.00 175 +2.50          IMAGING AND PROCEDURES  Imaging and Procedures for 07/06/2020  OCT, Retina - OU - Both Eyes       Right Eye Quality was borderline. Central Foveal  Thickness: 367. Progression has been stable. Findings include no IRF, outer retinal atrophy, retinal drusen ,  no SRF, pigment epithelial detachment, normal foveal contour (Stable improvement in IRF and overlying SRHM/SRF).   Left Eye Quality was good. Central Foveal Thickness: 272. Progression has been stable. Findings include normal foveal contour, no IRF, no SRF, retinal drusen , outer retinal atrophy.   Notes *Images captured and stored on drive  Diagnosis / Impression:  OD: exudative ARMD -- Stable improvement in IRF and overlying SRHM/SRF OS: nonexudative ARMD;  NFP, no IRF/SRF  Clinical management:  See below  Abbreviations: NFP - Normal foveal profile. CME - cystoid macular edema. PED - pigment epithelial detachment. IRF - intraretinal fluid. SRF - subretinal fluid. EZ - ellipsoid zone. ERM - epiretinal membrane. ORA - outer retinal atrophy. ORT - outer retinal tubulation. SRHM - subretinal hyper-reflective material. IRHM - intraretinal hyper-reflective material        Intravitreal Injection, Pharmacologic Agent - OD - Right Eye       Time Out 07/06/2020. 11:00 AM. Confirmed correct patient, procedure, site, and patient consented.   Anesthesia Topical anesthesia was used. Anesthetic medications included Lidocaine 2%, Proparacaine 0.5%.   Procedure Preparation included 5% betadine to ocular surface, eyelid speculum. A supplied needle was used.   Injection:  1.25 mg Bevacizumab (AVASTIN) 1.25mg /0.38mL SOLN   NDC: 40981-191-47, Lot: [id number] , Expiration date: 08/12/2020   Route: Intravitreal, Site: Right Eye, Waste: 0 mL  Post-op Post injection exam found visual acuity of at least counting fingers. The patient tolerated the procedure well. There were no complications. The patient received written and verbal post procedure care education. Post injection medications were not given.                 ASSESSMENT/PLAN:    ICD-10-CM   1. Exudative age-related macular  degeneration of right eye with active choroidal neovascularization (HCC)  H35.3211 Intravitreal Injection, Pharmacologic Agent - OD - Right Eye    Bevacizumab (AVASTIN) SOLN 1.25 mg  2. Retinal edema  H35.81 OCT, Retina - OU - Both Eyes  3. Intermediate stage nonexudative age-related macular degeneration of left eye  H35.3122   4. Amblyopia of eye, left  H53.002   5. Primary open angle glaucoma (POAG) of both eyes, moderate stage  H40.1132   6. Essential hypertension  I10   7. Hypertensive retinopathy of both eyes  H35.033   8. Pseudophakia of both eyes  Z96.1   9. Dry eyes, bilateral  H04.123     1,2. Exudative age related macular degeneration, OD  - OCT at presentation 07.29.21 showed CNVM w/shallow SRF OD  - FA 8.30.21 shows focal CNVM             - s/p IVA OD # 1 (07.29.21), #2 (08.30.21), #3 (09.27.21), #4 (11.02.21), #5 (12.13.21)  - excellent response  - BCVA 20/20 (stable)  - OCT shows shows stable improvement in IRF, PEDs and overly SRHM/SRF OD at 7 wks  - recommend IVA OD #6 today, 01.31.22 -- maintenance w/ extension to 8 wks - pt wishes to be treated with IVA OD - RBA of procedure discussed, questions answered - informed consent obtained and signed - see procedure note - discussed very slow tx and extend plan due to functional monocular status (OS amblyopia, 20/50-60)  - f/u in 8 wks -- DFE/OCT/possible injection  3. Age related macular degeneration, non-exudative, OS  - The incidence, anatomy, and pathology of dry AMD, risk of progression, and the AREDS and AREDS 2 studies including smoking risks discussed with patient.  - Recommend amsler grid monitoring  4.  Amblyopia OS  - long standing  - baseline BCVA ~20/50-60  - monitor  5. POAG OU  - under the expert management of Dr. Kathlen Mody  - IOP 17,20  6,7. Hypertensive retinopathy OU - discussed importance of tight BP control - monitor  8. Pseudophakia OU  - s/p CE/IOL  - IOL in good position, doing well  -  monitor  9. Dry eyes OU - recommend artificial tears and lubricating ointment as needed  Ophthalmic Meds Ordered this visit:  Meds ordered this encounter  Medications  . Bevacizumab (AVASTIN) SOLN 1.25 mg       Return in about 8 weeks (around 08/31/2020) for f/u exu ARMD OD, DFE, OCT.  There are no Patient Instructions on file for this visit.  This document serves as a record of services personally performed by Gardiner Sleeper, MD, PhD. It was created on their behalf by Leeann Must, [place], an ophthalmic technician. The creation of this record is the provider's dictation and/or activities during the visit.    Electronically signed by: Leeann Must, COA @TODAY @ 12:24 PM  Gardiner Sleeper, M.D., Ph.D. Diseases & Surgery of the Retina and [place] 07/06/2020   I have reviewed the above documentation for accuracy and completeness, and I agree with the above. Gardiner Sleeper, M.D., Ph.D. 07/06/20 12:24 PM   Abbreviations: M myopia (nearsighted); A astigmatism; H hyperopia (farsighted); P presbyopia; Mrx spectacle prescription;  CTL contact lenses; OD right eye; OS left eye; OU both eyes  XT exotropia; ET esotropia; PEK punctate epithelial keratitis; PEE punctate epithelial erosions; DES dry eye syndrome; MGD meibomian gland dysfunction; ATs artificial tears; PFAT's preservative free artificial tears; [place] nuclear sclerotic cataract; PSC posterior subcapsular cataract; ERM epi-retinal membrane; PVD posterior vitreous detachment; RD retinal detachment; DM diabetes mellitus; DR diabetic retinopathy; NPDR non-proliferative diabetic retinopathy; PDR proliferative diabetic retinopathy; CSME clinically significant macular edema; DME diabetic macular edema; dbh dot blot hemorrhages; CWS cotton wool spot; POAG primary open angle glaucoma; C/D cup-to-disc ratio; HVF humphrey visual field; GVF goldmann visual field; OCT optical coherence tomography; IOP intraocular  pressure; BRVO Branch retinal vein occlusion; CRVO central retinal vein occlusion; CRAO central retinal artery occlusion; BRAO branch retinal artery occlusion; RT retinal tear; SB scleral buckle; PPV pars plana vitrectomy; VH Vitreous hemorrhage; PRP panretinal laser photocoagulation; IVK intravitreal kenalog; VMT vitreomacular traction; MH Macular hole;  NVD neovascularization of the disc; NVE neovascularization elsewhere; AREDS age related eye disease study; ARMD age related macular degeneration; POAG primary open angle glaucoma; EBMD epithelial/anterior basement membrane dystrophy; ACIOL anterior chamber intraocular lens; IOL intraocular lens; PCIOL posterior chamber intraocular lens; Phaco/IOL phacoemulsification with intraocular lens placement; [place] photorefractive keratectomy; LASIK laser assisted in situ keratomileusis; HTN hypertension; DM diabetes mellitus; COPD chronic obstructive pulmonary disease

## 2020-07-06 ENCOUNTER — Encounter (INDEPENDENT_AMBULATORY_CARE_PROVIDER_SITE_OTHER): Payer: Self-pay | Admitting: Ophthalmology

## 2020-07-06 ENCOUNTER — Other Ambulatory Visit: Payer: Self-pay

## 2020-07-06 ENCOUNTER — Ambulatory Visit (INDEPENDENT_AMBULATORY_CARE_PROVIDER_SITE_OTHER): Payer: Medicare Other | Admitting: Ophthalmology

## 2020-07-06 DIAGNOSIS — H53002 Unspecified amblyopia, left eye: Secondary | ICD-10-CM

## 2020-07-06 DIAGNOSIS — I10 Essential (primary) hypertension: Secondary | ICD-10-CM

## 2020-07-06 DIAGNOSIS — H04123 Dry eye syndrome of bilateral lacrimal glands: Secondary | ICD-10-CM

## 2020-07-06 DIAGNOSIS — H35033 Hypertensive retinopathy, bilateral: Secondary | ICD-10-CM

## 2020-07-06 DIAGNOSIS — Z961 Presence of intraocular lens: Secondary | ICD-10-CM

## 2020-07-06 DIAGNOSIS — H353211 Exudative age-related macular degeneration, right eye, with active choroidal neovascularization: Secondary | ICD-10-CM | POA: Diagnosis not present

## 2020-07-06 DIAGNOSIS — H3581 Retinal edema: Secondary | ICD-10-CM

## 2020-07-06 DIAGNOSIS — H353122 Nonexudative age-related macular degeneration, left eye, intermediate dry stage: Secondary | ICD-10-CM

## 2020-07-06 DIAGNOSIS — H401132 Primary open-angle glaucoma, bilateral, moderate stage: Secondary | ICD-10-CM

## 2020-07-06 MED ORDER — BEVACIZUMAB CHEMO INJECTION 1.25MG/0.05ML SYRINGE FOR KALEIDOSCOPE
1.2500 mg | INTRAVITREAL | Status: AC | PRN
  Administered 2020-07-06: 1.25 mg via INTRAVITREAL

## 2020-07-14 DIAGNOSIS — M810 Age-related osteoporosis without current pathological fracture: Secondary | ICD-10-CM | POA: Insufficient documentation

## 2020-07-23 ENCOUNTER — Other Ambulatory Visit: Payer: Self-pay | Admitting: Family Medicine

## 2020-07-23 DIAGNOSIS — Z8739 Personal history of other diseases of the musculoskeletal system and connective tissue: Secondary | ICD-10-CM

## 2020-07-23 DIAGNOSIS — M7918 Myalgia, other site: Secondary | ICD-10-CM

## 2020-08-15 ENCOUNTER — Other Ambulatory Visit: Payer: Medicare Other

## 2020-08-17 ENCOUNTER — Other Ambulatory Visit: Payer: Self-pay

## 2020-08-17 ENCOUNTER — Ambulatory Visit
Admission: RE | Admit: 2020-08-17 | Discharge: 2020-08-17 | Disposition: A | Discharge status: Home / Self Care | Payer: Medicare Other | Source: Ambulatory Visit | Attending: Family Medicine | Admitting: Family Medicine

## 2020-08-17 DIAGNOSIS — M7918 Myalgia, other site: Secondary | ICD-10-CM

## 2020-08-17 DIAGNOSIS — Z8739 Personal history of other diseases of the musculoskeletal system and connective tissue: Secondary | ICD-10-CM

## 2020-08-17 IMAGING — MR MR HIP*L* WO/W CM
7 of 8 series · 34 of 40 positions shown · IV contrast (multihance)
Comparison: None.

CLINICAL DATA: Left buttock pain.

EXAM:
MRI OF THE LEFT HIP WITHOUT AND WITH CONTRAST
TECHNIQUE: Multiplanar, multisequence MR imaging was performed both before and
after administration of intravenous contrast.
CONTRAST:  14mL MULTIHANCE GADOBENATE DIMEGLUMINE 529 MG/ML IV SOLN

[Series 9: T2 fat-sat · coronal · left · 3.0mm · 0.89mm/px · 5 of 30 slices shown (1 of 2)]
[im 1/30]
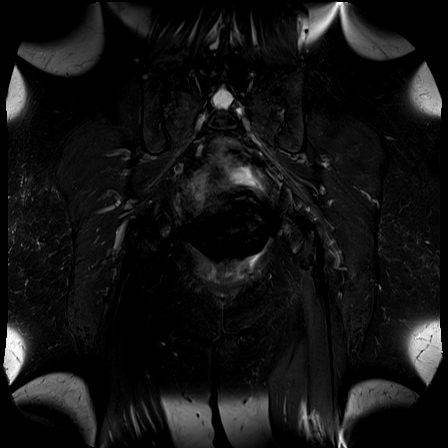
[im 8/30]
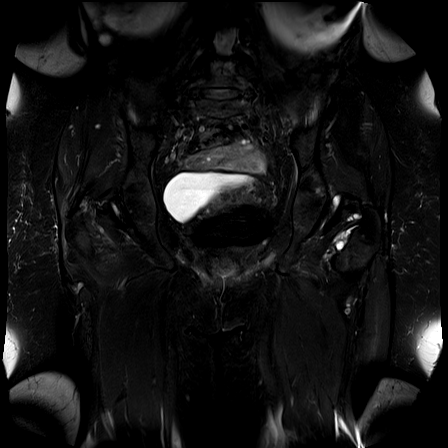
[im 15/30]
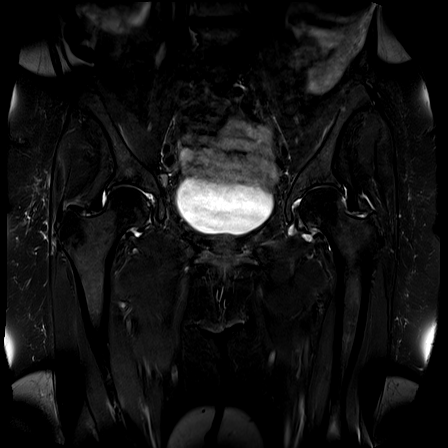
[im 22/30]
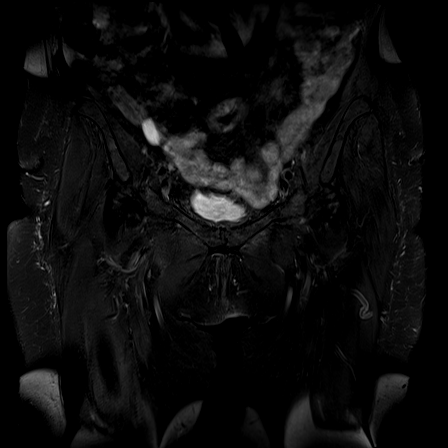
[im 30/30]
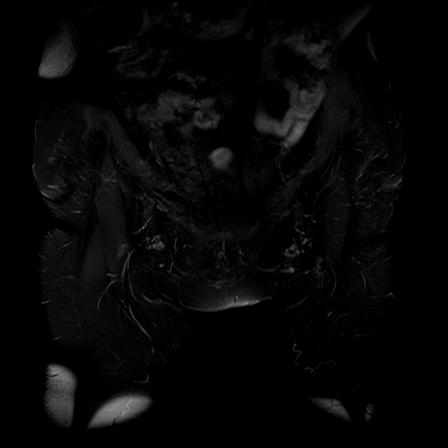

[Series 10: T1 · coronal · left · 3.0mm · 0.89mm/px · 5 of 30 slices shown]
[im 1/30]
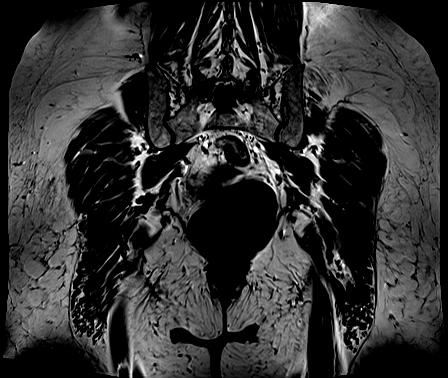
[im 8/30]
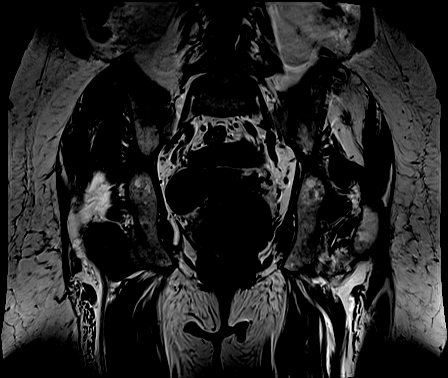
[im 15/30]
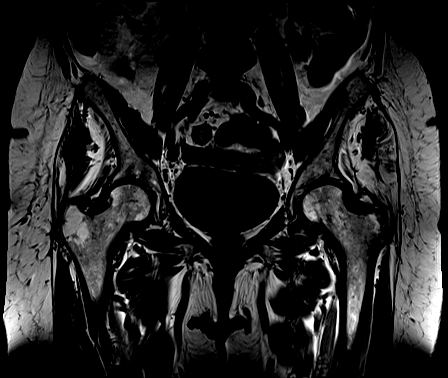
[im 22/30]
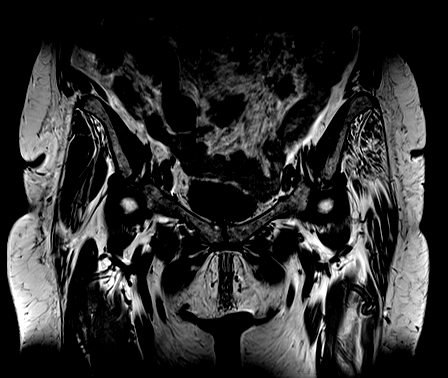
[im 30/30]
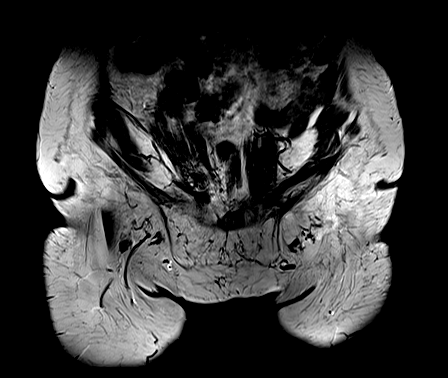

[Series 11: T2 fat-sat · axial · left · 3.0mm · 1.19mm/px · z∈[-50,+62]mm · 5 of 32 slices shown (2 of 2)]
[im 1/32]
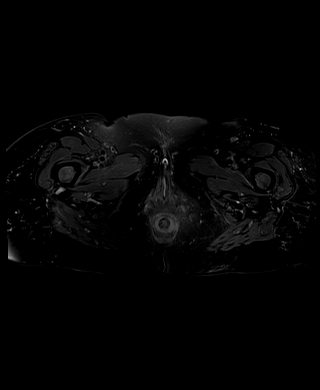
[im 8/32]
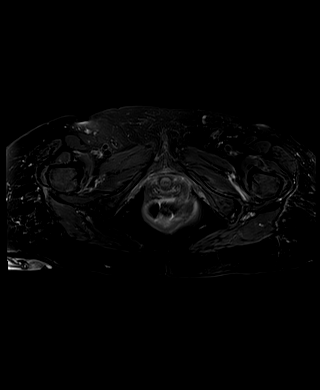
[im 16/32]
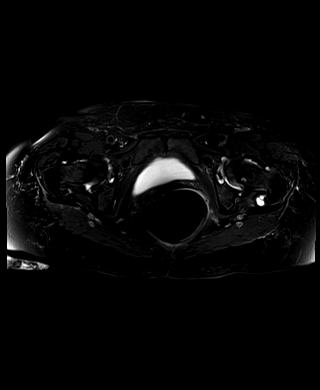
[im 24/32]
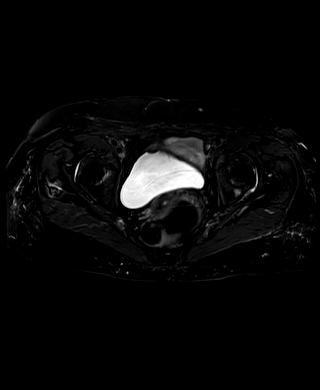
[im 32/32]
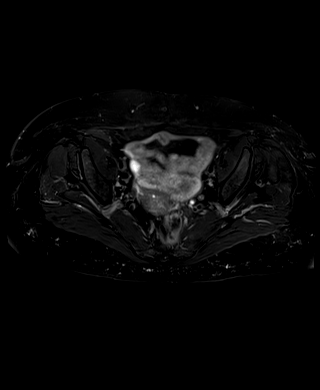

[Series 12: PD fat-sat · coronal · left · 3.0mm · 0.56mm/px · 5 of 28 slices shown (1 of 2)]
[im 1/28]
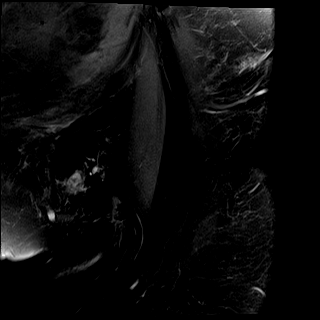
[im 7/28]
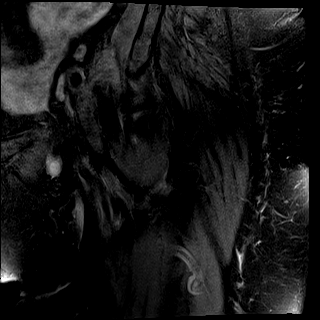
[im 14/28]
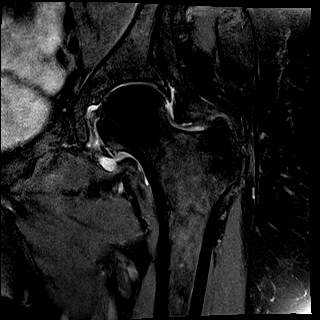
[im 21/28]
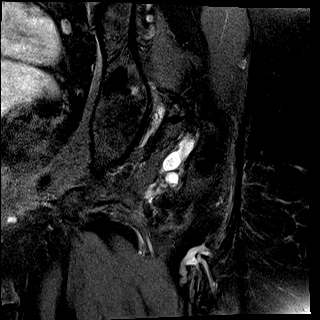
[im 28/28]
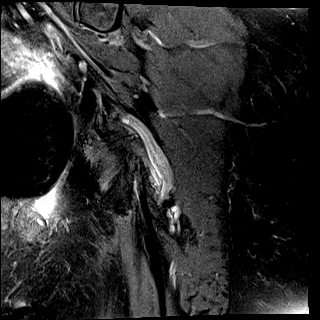

[Series 13: PD fat-sat · sagittal · left · 3.0mm · 0.56mm/px · 6 of 35 slices shown (2 of 2)]
[im 1/35]
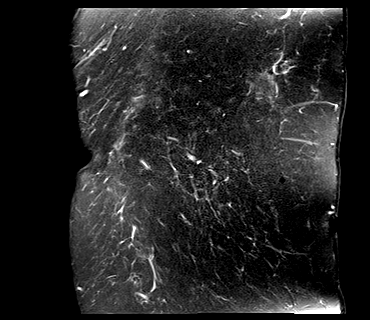
[im 7/35]
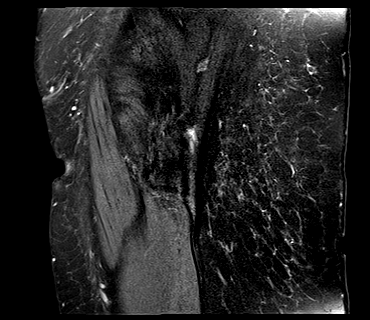
[im 14/35]
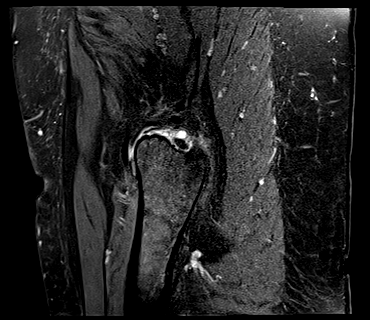
[im 21/35]
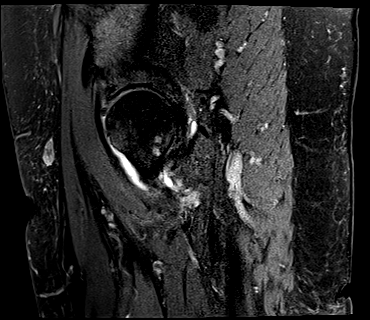
[im 28/35]
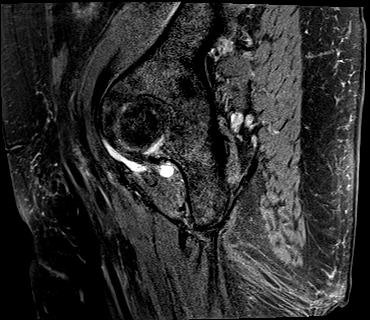
[im 35/35]
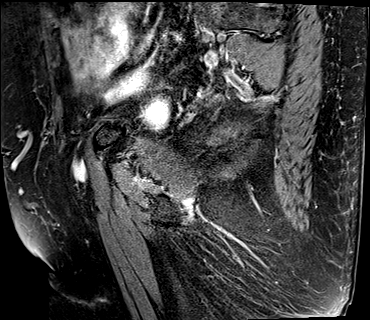

[Series 14: T1 fat-sat · axial · non-contrast · left · 3.0mm · 0.56mm/px · z∈[-49,+56]mm · 5 of 30 slices shown]
[im 1/30]
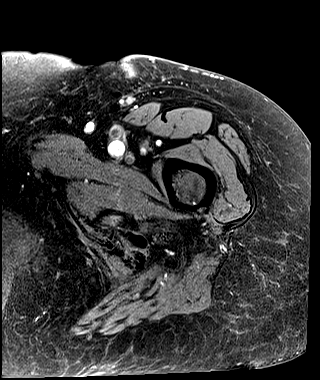
[im 8/30]
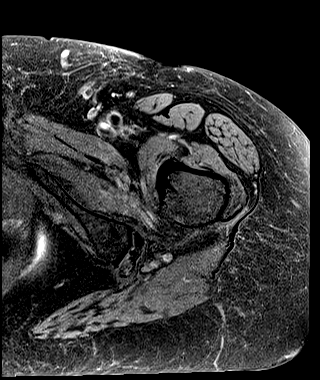
[im 15/30]
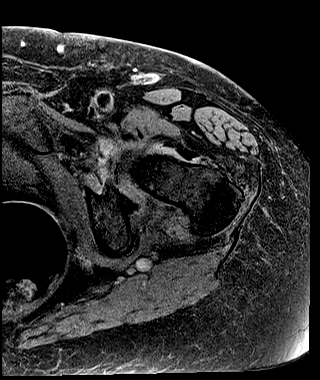
[im 22/30]
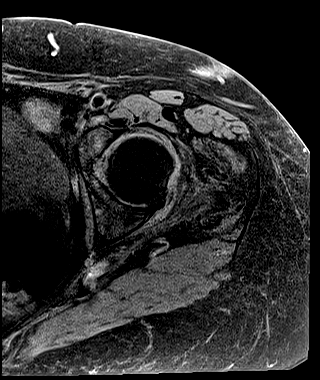
[im 30/30]
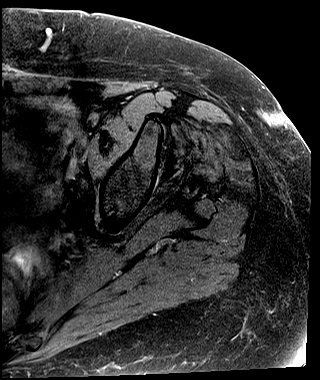

[Series 15: T1 fat-sat post-contrast · axial · left · 3.0mm · 0.56mm/px · z∈[-49,+2]mm · 3 of 30 slices shown]
[im 1/30]
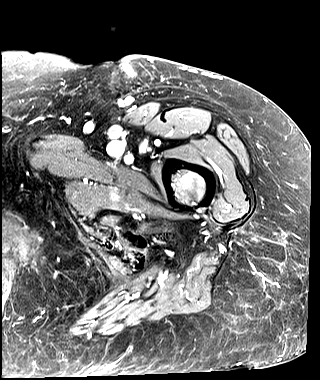
[im 8/30]
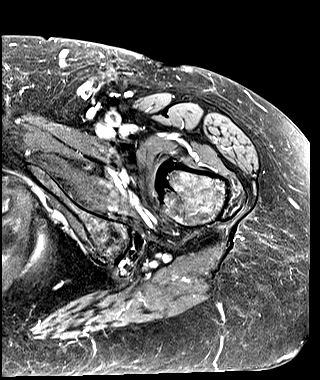
[im 15/30]
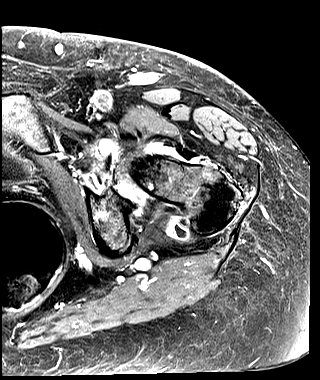

[34 of 40 positions shown; findings below may reference images not displayed]

FINDINGS: Bones: Medially in the left proximal femoral metaphysis, a 1.2 by
1.2 by 0.9 cm lesion with accentuated T2 and reduced T1 signal
characteristics demonstrates mild diffuse enhancement and has some
speckled internal signal, this lesion is not entirely specific but
has a well-defined zone of transition.

Small foci of reduced T1 and accentuated T2 signal posteriorly along
the femoral neck, most likely small geode given the sharp
definition.

Bilateral moderate degenerative hip arthropathy with associated
spurring.

1.3 by 0.9 by 1.0 cm focus of nonspecific enhancement and
accentuated T2 signal along the posterosuperior acetabulum on image
17 of series 16. Similar lesion along the proximal tip of the
greater trochanter, image 14 series 16.

Lower lumbar spondylosis and degenerative disc disease.

Articular cartilage and labrum

Articular cartilage:  Moderate degenerative chondral thinning.

Labrum: Degenerated and likely torn anterior superior labrum for
example on image 15 of series 13. Notable adjacent spurring of the
acetabulum.

Joint or bursal effusion

Joint effusion:  Small left and trace right hip joint effusions.

Bursae: No regional bursitis.

Muscles and tendons

Muscles and tendons: Gluteus medius and gluteus minimus atrophy on
the left. Mild proximal hamstring tendinopathy, left greater than
right.

Other findings

Miscellaneous: Trace proximal left hydrocele along a small left
groin hernia.
IMPRESSION: 1. Several small bony lesions are present along the posterosuperior
acetabulum, the posterior femoral neck, left greater trochanter, and
in the proximal femoral diaphysis. These are primarily nonspecific
with low T1 and high T2 signal and mild levels of enhancement. The
femoral neck lesion is probably a geode, the other lesions are
nonspecific but probably degenerative or due to enchondroma. If the
has a history of malignancy or if there is clinical suspicion for
myeloma then consider whole-body bone scan.
2. Small left and trace right hip joint effusions.
3. Moderate degenerative chondral thinning in the left hip joint.
4. Degenerated and likely torn anterior superior acetabular labrum.
5. Mild proximal hamstring tendinopathy, left greater than right.
6. Gluteus medius and gluteus minimus atrophy on the left.
7. Lower lumbar spondylosis and degenerative disc disease.
8. Trace proximal left hydrocele along a small left groin hernia.

## 2020-08-17 MED ORDER — GADOBENATE DIMEGLUMINE 529 MG/ML IV SOLN
14.0000 mL | Freq: Once | INTRAVENOUS | Status: AC | PRN
  Administered 2020-08-17: 14 mL via INTRAVENOUS

## 2020-08-26 NOTE — Progress Notes (Signed)
[facility] Note  08/27/2020     CHIEF COMPLAINT Patient presents for Retina Follow Up   HISTORY OF PRESENT ILLNESS: Kayla Hurley is a 86 y.o. female who presents to the clinic today for:   HPI    Retina Follow Up    Patient presents with  Wet AMD.  In right eye.  This started 8 weeks ago.  I, the attending physician,  performed the HPI with the patient and updated documentation appropriately.          Comments    Patient here for 8 weeks retina follow up for exu ARMD OD. Patient states vision this am eyeballs were sore. When looked a certain way got a sharp pain shot through OD. Otherwise no eye pain. Been using eye drops.       Last edited by Bernarda Caffey, MD on 08/28/2020  1:08 AM. (History)       Referring physician: Hortencia Pilar, MD [place],  [place] [address]  HISTORICAL INFORMATION:   Selected notes from the MEDICAL RECORD NUMBER Referred by Dr. Kathlen Mody for concern of exu ARMD OD   CURRENT MEDICATIONS: No current outpatient medications on file. (Ophthalmic Drugs)   No current facility-administered medications for this visit. (Ophthalmic Drugs)   Current Outpatient Medications (Other)  Medication Sig  . ALPRAZolam (XANAX) 0.25 MG tablet Take 0.25 mg by mouth at bedtime as needed for anxiety.  . Calcium Carb-Cholecalciferol (CALCIUM 1000 + D PO) Take 1 tablet by mouth in the morning and at bedtime.  . folic acid (FOLVITE) 1 MG tablet Take 1 mg by mouth daily.  Marland Kitchen leucovorin (WELLCOVORIN) 5 MG tablet Take 5 mg by mouth 2 (two) times a week.  . levothyroxine (SYNTHROID) 50 MCG tablet Take 50 mcg by mouth daily before breakfast.  . methotrexate (RHEUMATREX) 2.5 MG tablet Take 2.5 mg by mouth 4 (four) times a week. Caution:Chemotherapy. Protect from light.  . potassium chloride (KLOR-CON) 20 MEQ packet Take 20 mEq by mouth 2 (two) times daily.  . salsalate (DISALCID) 750 MG tablet Take 750 mg by mouth 2 (two)  times daily.  . simethicone (MYLICON) 80 MG chewable tablet Chew 80 mg by mouth as needed for flatulence.  . topiramate (TOPAMAX) 25 MG tablet Take 1 tablet (25 mg total) by mouth daily for 7 days, THEN 1 tablet (25 mg total) 2 (two) times daily.  Marland Kitchen triamterene-hydrochlorothiazide (MAXZIDE) 75-50 MG tablet Take 0.5 tablets by mouth daily.   No current facility-administered medications for this visit. (Other)      REVIEW OF SYSTEMS: ROS    Positive for: Musculoskeletal, Endocrine, Eyes   Negative for: Constitutional, Gastrointestinal, Neurological, Skin, Genitourinary, HENT, Cardiovascular, Respiratory, Psychiatric, Allergic/Imm, Heme/Lymph   Last edited by Theodore Demark, COA on 08/27/2020  9:22 AM. (History)       ALLERGIES Allergies  Allergen Reactions  . Adhesive [Tape] Other (See Comments)    blisters  . Penicillins Diarrhea, Nausea And Vomiting and Rash    PAST MEDICAL HISTORY Past Medical History:  Diagnosis Date  . COPD (chronic obstructive pulmonary disease) ([place])   . Glaucoma   . Macular degeneration   . Meningioma ([place])   . Rheumatoid arthritis [facility])    Past Surgical History:  Procedure Laterality Date  . APPENDECTOMY    . CESAREAN SECTION     x 3  . CHOLECYSTECTOMY    . CRANIECTOMY / CRANIOTOMY FOR EXCISION OF BRAIN TUMOR  meningioma  . EYE SURGERY    . HYSTEROSCOPY WITH D & C    . INGUINAL HERNIA REPAIR    . YAG LASER APPLICATION      FAMILY HISTORY Family History  Problem Relation Age of Onset  . Other Mother        died during open heart surgery  . Heart disease Father   . Diabetes Son     SOCIAL HISTORY Social History   Tobacco Use  . Smoking status: Former Research scientist (life sciences)  . Smokeless tobacco: Never Used  Vaping Use  . Vaping Use: Never used         OPHTHALMIC EXAM:  Base Eye Exam    Visual Acuity (Snellen - Linear)      Right Left   Dist cc 20/20 20/50 -1   Dist ph cc  NI   Correction: Glasses       Tonometry (Tonopen,  9:19 AM)      Right Left   Pressure 09 12       Pupils      Dark Light Shape React APD   Right 3 2 Round Brisk None   Left 3 2 Round Brisk None       Visual Fields (Counting fingers)      Left Right    Full Full       Extraocular Movement      Right Left    Full Full       Neuro/Psych    Oriented x3: Yes   Mood/Affect: Normal       Dilation    Both eyes: 1.0% Mydriacyl, 2.5% Phenylephrine @ 9:19 AM        Slit Lamp and Fundus Exam    Slit Lamp Exam      Right Left   Lids/Lashes Dermato Dermato, mild MGD   Conjunctiva/Sclera White and quiet White and quiet   Cornea Mild arcus, trace PEE, mild tear film debris 2-3+ PEE, mild tear film debris   Anterior Chamber Deep and quiet Deep and quiet   Iris Round and dilated Round and dilated   Lens PCIOL in excellent position, open pc PCIOL in excellent position   Vitreous syneresis syneresis       Fundus Exam      Right Left   Disc Pink, sharp, +cupping and central pallor, mild temporal PPA Pink and sharp, +cupping   C/D Ratio 0.65 0.6   Macula Blunted foveal reflex, drusen, RPE mottling, clumping and atrophy, +CNV and shallow SRF - stably improved, no heme Flat, blunted foveal reflex, Drusen, RPE mottling, clumping and early atrophy, No heme or edema   Vessels Attenuated and mild tortuosity Attenuated and mild tortuoisty   Periphery Attached, mild peripheral drusen, mild reticular degeneration, focal paving stone inferiorly Attached, mild peripheral drusen and mild reticular degeneration, paving stone inferiorly        Refraction    Wearing Rx      Sphere Cylinder Axis Add   Right -0.50 +1.75 010 +2.50   Left -2.50 +2.00 175 +2.50          IMAGING AND PROCEDURES  Imaging and Procedures for 08/27/2020  OCT, Retina - OU - Both Eyes       Right Eye Quality was borderline. Central Foveal Thickness: 292. Progression has been stable. Findings include no IRF, outer retinal atrophy, retinal drusen , no SRF, pigment  epithelial detachment, normal foveal contour (Stable improvement in IRF/SRF and overlying SRHM/PED).   Left Eye Quality was  good. Central Foveal Thickness: 272. Progression has been stable. Findings include normal foveal contour, no IRF, no SRF, retinal drusen , outer retinal atrophy.   Notes *Images captured and stored on drive  Diagnosis / Impression:  OD: exudative ARMD -- Stable improvement in IRF/SRF and overlying SRHM/PED OS: nonexudative ARMD;  NFP, no IRF/SRF  Clinical management:  See below  Abbreviations: NFP - Normal foveal profile. CME - cystoid macular edema. PED - pigment epithelial detachment. IRF - intraretinal fluid. SRF - subretinal fluid. EZ - ellipsoid zone. ERM - epiretinal membrane. ORA - outer retinal atrophy. ORT - outer retinal tubulation. SRHM - subretinal hyper-reflective material. IRHM - intraretinal hyper-reflective material        Intravitreal Injection, Pharmacologic Agent - OD - Right Eye       Time Out 08/27/2020. 10:42 AM. Confirmed correct patient, procedure, site, and patient consented.   Anesthesia Topical anesthesia was used. Anesthetic medications included Lidocaine 2%, Proparacaine 0.5%.   Procedure Preparation included 5% betadine to ocular surface, eyelid speculum. A supplied needle was used.   Injection:  1.25 mg Bevacizumab (AVASTIN) 1.25mg /0.59mL SOLN   NDC: H061816, Lot: [id number], Expiration date: 10/12/2020   Route: Intravitreal, Site: Right Eye, Waste: 0 mL  Post-op Post injection exam found visual acuity of at least counting fingers. The patient tolerated the procedure well. There were no complications. The patient received written and verbal post procedure care education. Post injection medications were not given.                 ASSESSMENT/PLAN:    ICD-10-CM   1. Exudative age-related macular degeneration of right eye with active choroidal neovascularization (HCC)  H35.3211 Intravitreal Injection, Pharmacologic  Agent - OD - Right Eye    Bevacizumab (AVASTIN) SOLN 1.25 mg  2. Retinal edema  H35.81 OCT, Retina - OU - Both Eyes  3. Intermediate stage nonexudative age-related macular degeneration of left eye  H35.3122   4. Amblyopia of eye, left  H53.002   5. Primary open angle glaucoma (POAG) of both eyes, moderate stage  H40.1132   6. Essential hypertension  I10   7. Hypertensive retinopathy of both eyes  H35.033   8. Pseudophakia of both eyes  Z96.1   9. Dry eyes, bilateral  H04.123     1,2. Exudative age related macular degeneration, OD  - OCT at presentation 07.29.21 showed CNVM w/shallow SRF OD  - FA 8.30.21 shows focal CNVM             - s/p IVA OD # 1 (07.29.21), #2 (08.30.21), #3 (09.27.21), #4 (11.02.21), #5 (12.13.21), #6 (1.31.22)  - excellent initial response and maintenance  - BCVA 20/20 (stable)  - OCT shows shows stable improvement in IRF/SRF overlying PED/SRHM OD at 8 wks  - recommend IVA OD #7 today, 03.24.22 -- maintenance w/ ext to 10 wks - pt wishes to be treated with IVA OD - RBA of procedure discussed, questions answered - informed consent obtained and signed - see procedure note - discussed slow tx and extend plan due to functional monocular status (OS amblyopia, 20/50-60)  - f/u in 10 wks -- DFE/OCT/possible injection  3. Age related macular degeneration, non-exudative, OS  - The incidence, anatomy, and pathology of dry AMD, risk of progression, and the AREDS and AREDS 2 studies including smoking risks discussed with patient.  - Recommend amsler grid monitoring  4. Amblyopia OS  - long standing  - baseline BCVA ~20/50-60  - monitor  5. POAG OU  -  under the expert management of Dr. Kathlen Mody  - IOP 09,12  6,7. Hypertensive retinopathy OU - discussed importance of tight BP control - monitor  8. Pseudophakia OU  - s/p CE/IOL  - IOL in good position, doing well  - monitor  9. Dry eyes OU - recommend artificial tears and lubricating ointment as  needed  Ophthalmic Meds Ordered this visit:  Meds ordered this encounter  Medications  . Bevacizumab (AVASTIN) SOLN 1.25 mg       Return in about 10 weeks (around 11/05/2020) for f/u exu ARMD OD, DFE, OCT.  There are no Patient Instructions on file for this visit.  This document serves as a record of services personally performed by Gardiner Sleeper, MD, PhD. It was created on their behalf by Estill Bakes, COT an ophthalmic technician. The creation of this record is the provider's dictation and/or activities during the visit.    Electronically signed by: Estill Bakes, COT 3.23.22 @ 1:11 AM   This document serves as a record of services personally performed by Gardiner Sleeper, MD, PhD. It was created on their behalf by San Jetty. Owens Shark, OA an ophthalmic technician. The creation of this record is the provider's dictation and/or activities during the visit.    Electronically signed by: San Jetty. Owens Shark, [place] 03.24.2022 1:11 AM  Gardiner Sleeper, M.D., Ph.D. Diseases & Surgery of the Retina and [place] 08/27/2020   I have reviewed the above documentation for accuracy and completeness, and I agree with the above. Gardiner Sleeper, M.D., Ph.D. 08/28/20 1:11 AM   Abbreviations: M myopia (nearsighted); A astigmatism; H hyperopia (farsighted); P presbyopia; Mrx spectacle prescription;  CTL contact lenses; OD right eye; OS left eye; OU both eyes  XT exotropia; ET esotropia; PEK punctate epithelial keratitis; PEE punctate epithelial erosions; DES dry eye syndrome; MGD meibomian gland dysfunction; ATs artificial tears; PFAT's preservative free artificial tears; [place] nuclear sclerotic cataract; PSC posterior subcapsular cataract; ERM epi-retinal membrane; PVD posterior vitreous detachment; RD retinal detachment; DM diabetes mellitus; DR diabetic retinopathy; NPDR non-proliferative diabetic retinopathy; PDR proliferative diabetic retinopathy; CSME clinically significant  macular edema; DME diabetic macular edema; dbh dot blot hemorrhages; CWS cotton wool spot; POAG primary open angle glaucoma; C/D cup-to-disc ratio; HVF humphrey visual field; GVF goldmann visual field; OCT optical coherence tomography; IOP intraocular pressure; BRVO Branch retinal vein occlusion; CRVO central retinal vein occlusion; CRAO central retinal artery occlusion; BRAO branch retinal artery occlusion; RT retinal tear; SB scleral buckle; PPV pars plana vitrectomy; VH Vitreous hemorrhage; PRP panretinal laser photocoagulation; IVK intravitreal kenalog; VMT vitreomacular traction; MH Macular hole;  NVD neovascularization of the disc; NVE neovascularization elsewhere; AREDS age related eye disease study; ARMD age related macular degeneration; POAG primary open angle glaucoma; EBMD epithelial/anterior basement membrane dystrophy; ACIOL anterior chamber intraocular lens; IOL intraocular lens; PCIOL posterior chamber intraocular lens; Phaco/IOL phacoemulsification with intraocular lens placement; [place] photorefractive keratectomy; LASIK laser assisted in situ keratomileusis; HTN hypertension; DM diabetes mellitus; COPD chronic obstructive pulmonary disease

## 2020-08-27 ENCOUNTER — Encounter (INDEPENDENT_AMBULATORY_CARE_PROVIDER_SITE_OTHER): Payer: Self-pay | Admitting: Ophthalmology

## 2020-08-27 ENCOUNTER — Ambulatory Visit (INDEPENDENT_AMBULATORY_CARE_PROVIDER_SITE_OTHER): Payer: Medicare Other | Admitting: Ophthalmology

## 2020-08-27 ENCOUNTER — Other Ambulatory Visit: Payer: Self-pay

## 2020-08-27 DIAGNOSIS — I10 Essential (primary) hypertension: Secondary | ICD-10-CM

## 2020-08-27 DIAGNOSIS — H3581 Retinal edema: Secondary | ICD-10-CM

## 2020-08-27 DIAGNOSIS — H53002 Unspecified amblyopia, left eye: Secondary | ICD-10-CM

## 2020-08-27 DIAGNOSIS — Z961 Presence of intraocular lens: Secondary | ICD-10-CM

## 2020-08-27 DIAGNOSIS — H353211 Exudative age-related macular degeneration, right eye, with active choroidal neovascularization: Secondary | ICD-10-CM

## 2020-08-27 DIAGNOSIS — H353122 Nonexudative age-related macular degeneration, left eye, intermediate dry stage: Secondary | ICD-10-CM

## 2020-08-27 DIAGNOSIS — H04123 Dry eye syndrome of bilateral lacrimal glands: Secondary | ICD-10-CM

## 2020-08-27 DIAGNOSIS — H35033 Hypertensive retinopathy, bilateral: Secondary | ICD-10-CM

## 2020-08-27 DIAGNOSIS — H401132 Primary open-angle glaucoma, bilateral, moderate stage: Secondary | ICD-10-CM

## 2020-08-28 ENCOUNTER — Encounter (INDEPENDENT_AMBULATORY_CARE_PROVIDER_SITE_OTHER): Payer: Self-pay | Admitting: Ophthalmology

## 2020-08-28 DIAGNOSIS — H353211 Exudative age-related macular degeneration, right eye, with active choroidal neovascularization: Secondary | ICD-10-CM | POA: Diagnosis not present

## 2020-08-28 MED ORDER — BEVACIZUMAB CHEMO INJECTION 1.25MG/0.05ML SYRINGE FOR KALEIDOSCOPE
1.2500 mg | INTRAVITREAL | Status: AC | PRN
  Administered 2020-08-28: 1.25 mg via INTRAVITREAL

## 2020-09-03 NOTE — Progress Notes (Deleted)
    Assessment/Plan:    1.  Essential Tremor  ***  Subjective:   Kayla Hurley was seen today in follow up for essential tremor.  My previous records were reviewed prior to todays visit.  Last visit, patient was interested in trying [employer].  Ultimately, the device was not affordable.  We decided to start low-dose topiramate.  Current prescribed movement disorder medications: ***Topiramate, 25 mg twice per day (started last visit)   Previously tried tremor medications: Primidone, 50 mg, half tablet at bedtime ( "it knocks me out" but it helped the tremor at very low dosages - 1/4 tablet); propranolol, 10 mg 3 times per day (BP low); xanax helped it  ALLERGIES:   Allergies  Allergen Reactions  . Adhesive [Tape] Other (See Comments)    blisters  . Penicillins Diarrhea, Nausea And Vomiting and Rash    CURRENT MEDICATIONS:  Outpatient Encounter Medications as of 09/04/2020  Medication Sig  . ALPRAZolam (XANAX) 0.25 MG tablet Take 0.25 mg by mouth at bedtime as needed for anxiety.  . Calcium Carb-Cholecalciferol (CALCIUM 1000 + D PO) Take 1 tablet by mouth in the morning and at bedtime.  . folic acid (FOLVITE) 1 MG tablet Take 1 mg by mouth daily.  Marland Kitchen leucovorin (WELLCOVORIN) 5 MG tablet Take 5 mg by mouth 2 (two) times a week.  . levothyroxine (SYNTHROID) 50 MCG tablet Take 50 mcg by mouth daily before breakfast.  . methotrexate (RHEUMATREX) 2.5 MG tablet Take 2.5 mg by mouth 4 (four) times a week. Caution:Chemotherapy. Protect from light.  . potassium chloride (KLOR-CON) 20 MEQ packet Take 20 mEq by mouth 2 (two) times daily.  . salsalate (DISALCID) 750 MG tablet Take 750 mg by mouth 2 (two) times daily.  . simethicone (MYLICON) 80 MG chewable tablet Chew 80 mg by mouth as needed for flatulence.  . topiramate (TOPAMAX) 25 MG tablet Take 1 tablet (25 mg total) by mouth daily for 7 days, THEN 1 tablet (25 mg total) 2 (two) times daily.  Marland Kitchen triamterene-hydrochlorothiazide (MAXZIDE)  75-50 MG tablet Take 0.5 tablets by mouth daily.   No facility-administered encounter medications on file as of 09/04/2020.     Objective:    PHYSICAL EXAMINATION:    VITALS:  There were no vitals filed for this visit.  GEN:  The patient appears stated age and is in NAD. HEENT:  Normocephalic, atraumatic.  The mucous membranes are moist. The superficial temporal arteries are without ropiness or tenderness. CV:  RRR Lungs:  CTAB Neck/HEME:  There are no carotid bruits bilaterally.  Neurological examination:  Orientation: The patient is alert and oriented x3. Cranial nerves: There is good facial symmetry. The speech is fluent and clear. Soft palate rises symmetrically and there is no tongue deviation. Hearing is intact to conversational tone. Sensation: Sensation is intact to light touch throughout Motor: Strength is at least antigravity x4.  Movement examination: Tone: There is normal tone in the UE/LE Abnormal movements: *** Coordination:  There is *** decremation with RAM's, *** Gait and Station: The patient has *** difficulty arising out of a deep-seated chair without the use of the hands. The patient's stride length is good    Total time spent on today's visit was ***30 minutes, including both face-to-face time and nonface-to-face time.  Time included that spent on review of records (prior notes available to me/labs/imaging if pertinent), discussing treatment and goals, answering patient's questions and coordinating care.  Cc:  Buzzy Han, MD

## 2020-09-04 ENCOUNTER — Ambulatory Visit: Payer: Medicare Other | Admitting: Neurology

## 2020-09-08 ENCOUNTER — Telehealth: Payer: Self-pay | Admitting: Neurology

## 2020-09-08 NOTE — Telephone Encounter (Signed)
Patient called and said she stopped taking the new medication (? Name) after two days due to stomach upset. She said she's gone back to propranolol and primidone. FYI only, per patient.

## 2020-09-08 NOTE — Telephone Encounter (Signed)
Noted.  Pt just n/s an appt with me and haven't seen her since Sept.

## 2020-09-10 ENCOUNTER — Encounter (INDEPENDENT_AMBULATORY_CARE_PROVIDER_SITE_OTHER): Payer: Self-pay | Admitting: Ophthalmology

## 2020-09-10 ENCOUNTER — Ambulatory Visit (INDEPENDENT_AMBULATORY_CARE_PROVIDER_SITE_OTHER): Payer: Medicare Other | Admitting: Ophthalmology

## 2020-09-10 ENCOUNTER — Other Ambulatory Visit: Payer: Self-pay

## 2020-09-10 DIAGNOSIS — H353211 Exudative age-related macular degeneration, right eye, with active choroidal neovascularization: Secondary | ICD-10-CM | POA: Diagnosis not present

## 2020-09-10 DIAGNOSIS — H53002 Unspecified amblyopia, left eye: Secondary | ICD-10-CM

## 2020-09-10 DIAGNOSIS — H401132 Primary open-angle glaucoma, bilateral, moderate stage: Secondary | ICD-10-CM

## 2020-09-10 DIAGNOSIS — H04123 Dry eye syndrome of bilateral lacrimal glands: Secondary | ICD-10-CM

## 2020-09-10 DIAGNOSIS — I10 Essential (primary) hypertension: Secondary | ICD-10-CM | POA: Diagnosis not present

## 2020-09-10 DIAGNOSIS — Z961 Presence of intraocular lens: Secondary | ICD-10-CM

## 2020-09-10 DIAGNOSIS — H3581 Retinal edema: Secondary | ICD-10-CM | POA: Diagnosis not present

## 2020-09-10 DIAGNOSIS — H353122 Nonexudative age-related macular degeneration, left eye, intermediate dry stage: Secondary | ICD-10-CM | POA: Diagnosis not present

## 2020-09-10 DIAGNOSIS — H35033 Hypertensive retinopathy, bilateral: Secondary | ICD-10-CM

## 2020-09-10 NOTE — Progress Notes (Signed)
[facility] Note  09/10/2020     CHIEF COMPLAINT Patient presents for Retina Follow Up   HISTORY OF PRESENT ILLNESS: Kayla Hurley is a 86 y.o. female who presents to the clinic today for:   HPI    Retina Follow Up    Patient presents with  Other.  In both eyes.  This started 2 days ago.  I, the attending physician,  performed the HPI with the patient and updated documentation appropriately.          Comments    Patient here for retina evaluation. Decrease in vision. Patient states vision is better. Still not where it was the day before yesterday. Can't blink it away. Can't tell which eye. OD was like a fog or a mist over it. Couldn't read. Difficult to read. No eye pain.       Last edited by Bernarda Caffey, MD on 09/10/2020  1:51 PM. (History)    Patient c/o decrease in vision OD.   Referring physician: Buzzy Han, MD [place],  [place] [address]  HISTORICAL INFORMATION:   Selected notes from the MEDICAL RECORD NUMBER Referred by Dr. Kathlen Mody for concern of exu ARMD OD   CURRENT MEDICATIONS: No current outpatient medications on file. (Ophthalmic Drugs)   No current facility-administered medications for this visit. (Ophthalmic Drugs)   Current Outpatient Medications (Other)  Medication Sig  . ALPRAZolam (XANAX) 0.25 MG tablet Take 0.25 mg by mouth at bedtime as needed for anxiety.  . Calcium Carb-Cholecalciferol (CALCIUM 1000 + D PO) Take 1 tablet by mouth in the morning and at bedtime.  . folic acid (FOLVITE) 1 MG tablet Take 1 mg by mouth daily.  Marland Kitchen leucovorin (WELLCOVORIN) 5 MG tablet Take 5 mg by mouth 2 (two) times a week.  . levothyroxine (SYNTHROID) 50 MCG tablet Take 50 mcg by mouth daily before breakfast.  . methotrexate (RHEUMATREX) 2.5 MG tablet Take 2.5 mg by mouth 4 (four) times a week. Caution:Chemotherapy. Protect from light.  . potassium chloride (KLOR-CON) 20 MEQ packet Take 20 mEq by mouth 2 (two)  times daily.  . salsalate (DISALCID) 750 MG tablet Take 750 mg by mouth 2 (two) times daily.  . simethicone (MYLICON) 80 MG chewable tablet Chew 80 mg by mouth as needed for flatulence.  . topiramate (TOPAMAX) 25 MG tablet Take 1 tablet (25 mg total) by mouth daily for 7 days, THEN 1 tablet (25 mg total) 2 (two) times daily.  Marland Kitchen triamterene-hydrochlorothiazide (MAXZIDE) 75-50 MG tablet Take 0.5 tablets by mouth daily.   No current facility-administered medications for this visit. (Other)      REVIEW OF SYSTEMS: ROS    Positive for: Musculoskeletal, Endocrine, Eyes   Negative for: Constitutional, Gastrointestinal, Neurological, Skin, Genitourinary, HENT, Cardiovascular, Respiratory, Psychiatric, Allergic/Imm, Heme/Lymph   Last edited by Theodore Demark, COA on 09/10/2020  9:40 AM. (History)       ALLERGIES Allergies  Allergen Reactions  . Adhesive [Tape] Other (See Comments)    blisters  . Penicillins Diarrhea, Nausea And Vomiting and Rash    PAST MEDICAL HISTORY Past Medical History:  Diagnosis Date  . COPD (chronic obstructive pulmonary disease) ([place])   . Glaucoma   . Macular degeneration   . Meningioma ([place])   . Rheumatoid arthritis [facility])    Past Surgical History:  Procedure Laterality Date  . APPENDECTOMY    . CESAREAN SECTION     x 3  . CHOLECYSTECTOMY    .  CRANIECTOMY / CRANIOTOMY FOR EXCISION OF BRAIN TUMOR     meningioma  . EYE SURGERY    . HYSTEROSCOPY WITH D & C    . INGUINAL HERNIA REPAIR    . YAG LASER APPLICATION      FAMILY HISTORY Family History  Problem Relation Age of Onset  . Other Mother        died during open heart surgery  . Heart disease Father   . Diabetes Son     SOCIAL HISTORY Social History   Tobacco Use  . Smoking status: Former Research scientist (life sciences)  . Smokeless tobacco: Never Used  Vaping Use  . Vaping Use: Never used         OPHTHALMIC EXAM:  Base Eye Exam    Visual Acuity (Snellen - Linear)      Right Left   Dist cc 20/30 -2  20/70 +2   Dist ph cc NI 20/50 -1   Correction: Glasses       Tonometry (Tonopen, 9:35 AM)      Right Left   Pressure 16 15       Pupils      Dark Light Shape React APD   Right 3 2 Round Brisk None   Left 3 2 Round Brisk None       Visual Fields (Counting fingers)      Left Right    Full Full       Extraocular Movement      Right Left    Full Full       Neuro/Psych    Oriented x3: Yes   Mood/Affect: Normal       Dilation    Both eyes: 1.0% Mydriacyl, 2.5% Phenylephrine @ 9:35 AM        Slit Lamp and Fundus Exam    Slit Lamp Exam      Right Left   Lids/Lashes Dermato Dermato, mild MGD   Conjunctiva/Sclera White and quiet White and quiet   Cornea Mild arcus, 1-2+ PEE, mild tear film debris 2-3+ PEE, mild tear film debris   Anterior Chamber Deep and quiet Deep and quiet   Iris Round and dilated Round and dilated   Lens PCIOL in excellent position, open pc PCIOL in excellent position   Vitreous syneresis syneresis       Fundus Exam      Right Left   Disc Pink, sharp, +cupping and central pallor, mild temporal PPA Pink and sharp, +cupping   C/D Ratio 0.65 0.6   Macula Blunted foveal reflex, drusen, RPE mottling, clumping and atrophy, +CNV and shallow SRF - stably improved, no heme Flat, blunted foveal reflex, Drusen, RPE mottling, clumping and early atrophy, No heme or edema   Vessels Attenuated and mild tortuosity Attenuated and mild tortuoisty   Periphery Attached, mild peripheral drusen, mild reticular degeneration, focal paving stone inferiorly Attached, mild peripheral drusen and mild reticular degeneration, paving stone inferiorly        Refraction    Wearing Rx      Sphere Cylinder Axis Add   Right -0.50 +1.75 010 +2.50   Left -2.50 +2.00 175 +2.50          IMAGING AND PROCEDURES  Imaging and Procedures for 09/10/2020  OCT, Retina - OU - Both Eyes       Right Eye Quality was borderline. Central Foveal Thickness: 288. Progression has been  stable. Findings include no IRF, outer retinal atrophy, retinal drusen , no SRF, pigment epithelial detachment, normal foveal  contour (Stable improvement in IRF/SRF and overlying SRHM/PED).   Left Eye Quality was good. Central Foveal Thickness: 275. Progression has been stable. Findings include normal foveal contour, no IRF, no SRF, retinal drusen , outer retinal atrophy.   Notes *Images captured and stored on drive  Diagnosis / Impression:  OD: exudative ARMD -- Stable improvement in IRF/SRF and overlying SRHM/PED OS: nonexudative ARMD;  NFP, no IRF/SRF  Clinical management:  See below  Abbreviations: NFP - Normal foveal profile. CME - cystoid macular edema. PED - pigment epithelial detachment. IRF - intraretinal fluid. SRF - subretinal fluid. EZ - ellipsoid zone. ERM - epiretinal membrane. ORA - outer retinal atrophy. ORT - outer retinal tubulation. SRHM - subretinal hyper-reflective material. IRHM - intraretinal hyper-reflective material                 ASSESSMENT/PLAN:    ICD-10-CM   1. Exudative age-related macular degeneration of right eye with active choroidal neovascularization ([place])  H35.3211   2. Retinal edema  H35.81 OCT, Retina - OU - Both Eyes  3. Intermediate stage nonexudative age-related macular degeneration of left eye  H35.3122   4. Amblyopia of eye, left  H53.002   5. Primary open angle glaucoma (POAG) of both eyes, moderate stage  H40.1132   6. Essential hypertension  I10   7. Hypertensive retinopathy of both eyes  H35.033   8. Pseudophakia of both eyes  Z96.1   9. Dry eyes, bilateral  H04.123     **pt presents acutely for decreased vision OD**  - BCVA 20/30 from 20/20  - no significant change in OCT and fundus exam  - suspect increase in PEE / dry eyes causing symptoms  - recommend increasing use of ATs and consider switching brand  1,2. Exudative age related macular degeneration, OD  - OCT at presentation 07.29.21 showed CNVM w/shallow SRF OD  -  FA 8.30.21 shows focal CNVM             - s/p IVA OD # 1 (07.29.21), #2 (08.30.21), #3 (09.27.21), #4 (11.02.21), #5 (12.13.21), #6 (01.31.22), #7 (03.24.22)  - excellent initial response and maintenance  - BCVA 20/20 (stable)  - OCT shows shows stable improvement in IRF/SRF overlying PED/SRHM OD at 8 wks - discussed slow tx and extend plan due to functional monocular status (OS amblyopia, 20/50-60)  - f/u in 10 wks as scheduled  3. Age related macular degeneration, non-exudative, OS  - The incidence, anatomy, and pathology of dry AMD, risk of progression, and the AREDS and AREDS 2 studies including smoking risks discussed with patient.  - Recommend amsler grid monitoring  4. Amblyopia OS  - long standing  - baseline BCVA ~20/50-60  - monitor  5. POAG OU  - under the expert management of Dr. Kathlen Mody  - IOP 09,12  6,7. Hypertensive retinopathy OU - discussed importance of tight BP control - monitor  8. Pseudophakia OU  - s/p CE/IOL  - IOL in good position, doing well  - monitor  9. Dry eyes OU (OD>OS) - recommend switching AT's brand as blurry vision OD due to dry eye - increase frequency of use  Ophthalmic Meds Ordered this visit:  No orders of the defined types were placed in this encounter.     Return for as scheduled.  There are no Patient Instructions on file for this visit.  This document serves as a record of services personally performed by Gardiner Sleeper, MD, PhD. It was created on their behalf by  Roselee Nova, COMT. The creation of this record is the provider's dictation and/or activities during the visit.  Electronically signed by: Roselee Nova, COMT 09/10/20 1:57 PM  Gardiner Sleeper, M.D., Ph.D. Diseases & Surgery of the Retina and Vitreous [facility]  I have reviewed the above documentation for accuracy and completeness, and I agree with the above. Gardiner Sleeper, M.D., Ph.D. 09/10/20 1:57 PM  Abbreviations: M myopia  (nearsighted); A astigmatism; H hyperopia (farsighted); P presbyopia; Mrx spectacle prescription;  CTL contact lenses; OD right eye; OS left eye; OU both eyes  XT exotropia; ET esotropia; PEK punctate epithelial keratitis; PEE punctate epithelial erosions; DES dry eye syndrome; MGD meibomian gland dysfunction; ATs artificial tears; PFAT's preservative free artificial tears; [place] nuclear sclerotic cataract; PSC posterior subcapsular cataract; ERM epi-retinal membrane; PVD posterior vitreous detachment; RD retinal detachment; DM diabetes mellitus; DR diabetic retinopathy; NPDR non-proliferative diabetic retinopathy; PDR proliferative diabetic retinopathy; CSME clinically significant macular edema; DME diabetic macular edema; dbh dot blot hemorrhages; CWS cotton wool spot; POAG primary open angle glaucoma; C/D cup-to-disc ratio; HVF humphrey visual field; GVF goldmann visual field; OCT optical coherence tomography; IOP intraocular pressure; BRVO Branch retinal vein occlusion; CRVO central retinal vein occlusion; CRAO central retinal artery occlusion; BRAO branch retinal artery occlusion; RT retinal tear; SB scleral buckle; PPV pars plana vitrectomy; VH Vitreous hemorrhage; PRP panretinal laser photocoagulation; IVK intravitreal kenalog; VMT vitreomacular traction; MH Macular hole;  NVD neovascularization of the disc; NVE neovascularization elsewhere; AREDS age related eye disease study; ARMD age related macular degeneration; POAG primary open angle glaucoma; EBMD epithelial/anterior basement membrane dystrophy; ACIOL anterior chamber intraocular lens; IOL intraocular lens; PCIOL posterior chamber intraocular lens; Phaco/IOL phacoemulsification with intraocular lens placement; [place] photorefractive keratectomy; LASIK laser assisted in situ keratomileusis; HTN hypertension; DM diabetes mellitus; COPD chronic obstructive pulmonary disease

## 2020-09-21 ENCOUNTER — Other Ambulatory Visit (HOSPITAL_COMMUNITY): Payer: Self-pay | Admitting: Internal Medicine

## 2020-09-21 ENCOUNTER — Other Ambulatory Visit: Payer: Self-pay | Admitting: Internal Medicine

## 2020-09-21 DIAGNOSIS — R936 Abnormal findings on diagnostic imaging of limbs: Secondary | ICD-10-CM

## 2020-09-28 ENCOUNTER — Encounter (HOSPITAL_COMMUNITY)
Admission: RE | Admit: 2020-09-28 | Discharge: 2020-09-28 | Disposition: A | Discharge status: Home / Self Care | Payer: Medicare Other | Source: Ambulatory Visit | Attending: Internal Medicine | Admitting: Internal Medicine

## 2020-09-28 ENCOUNTER — Other Ambulatory Visit: Payer: Self-pay

## 2020-09-28 DIAGNOSIS — R936 Abnormal findings on diagnostic imaging of limbs: Secondary | ICD-10-CM | POA: Insufficient documentation

## 2020-09-28 MED ORDER — TECHNETIUM TC 99M MEDRONATE IV KIT
20.0000 | PACK | Freq: Once | INTRAVENOUS | Status: AC | PRN
  Administered 2020-09-28: 21.1 via INTRAVENOUS

## 2020-11-04 NOTE — Progress Notes (Signed)
[facility] Note  11/06/2020     CHIEF COMPLAINT Patient presents for Retina Follow Up   HISTORY OF PRESENT ILLNESS: Barry Culverhouse is a 86 y.o. female who presents to the clinic today for:   HPI    Retina Follow Up    Patient presents with  Wet AMD.  In right eye.  This started 8 weeks ago.  I, the attending physician,  performed the HPI with the patient and updated documentation appropriately.          Comments    Patient here for 8 weeks retina follow up for exu ARMD OD. Patient states vision doing better. Eye are dry. Saw Dr. Kathlen Mody- going to change glaucoma drops. When scrubbing lids noticed the lids are sore for last couple of days. Still doing physical therapy.       Last edited by Bernarda Caffey, MD on 11/06/2020  1:17 PM. (History)    pt has seen Dr. Kathlen Mody twice since she was here last for dry eyes, pt states Dr. Kathlen Mody gave her a rx eye drop, but has told her to stop using it bc he can change her glaucoma medication, she states it is a medication she will have to use twice a day instead of one, she states Dr. Kathlen Mody wants to do a laser procedure, but she is not sure about that and prefers to try drops first instead  Referring physician: Hortencia Pilar, MD [place],  [place] [address]  HISTORICAL INFORMATION:   Selected notes from the MEDICAL RECORD NUMBER Referred by Dr. Kathlen Mody for concern of exu ARMD OD   CURRENT MEDICATIONS: No current outpatient medications on file. (Ophthalmic Drugs)   No current facility-administered medications for this visit. (Ophthalmic Drugs)   Current Outpatient Medications (Other)  Medication Sig  . ALPRAZolam (XANAX) 0.25 MG tablet Take 0.25 mg by mouth at bedtime as needed for anxiety.  . Calcium Carb-Cholecalciferol (CALCIUM 1000 + D PO) Take 1 tablet by mouth in the morning and at bedtime.  . folic acid (FOLVITE) 1 MG tablet Take 1 mg by mouth daily.  Marland Kitchen leucovorin (WELLCOVORIN) 5  MG tablet Take 5 mg by mouth 2 (two) times a week.  . levothyroxine (SYNTHROID) 50 MCG tablet Take 50 mcg by mouth daily before breakfast.  . methotrexate (RHEUMATREX) 2.5 MG tablet Take 2.5 mg by mouth 4 (four) times a week. Caution:Chemotherapy. Protect from light.  . potassium chloride (KLOR-CON) 20 MEQ packet Take 20 mEq by mouth 2 (two) times daily.  . salsalate (DISALCID) 750 MG tablet Take 750 mg by mouth 2 (two) times daily.  . simethicone (MYLICON) 80 MG chewable tablet Chew 80 mg by mouth as needed for flatulence.  . topiramate (TOPAMAX) 25 MG tablet Take 1 tablet (25 mg total) by mouth daily for 7 days, THEN 1 tablet (25 mg total) 2 (two) times daily.  Marland Kitchen triamterene-hydrochlorothiazide (MAXZIDE) 75-50 MG tablet Take 0.5 tablets by mouth daily.   No current facility-administered medications for this visit. (Other)      REVIEW OF SYSTEMS: ROS    Positive for: Musculoskeletal, Endocrine, Eyes   Negative for: Constitutional, Gastrointestinal, Neurological, Skin, Genitourinary, HENT, Cardiovascular, Respiratory, Psychiatric, Allergic/Imm, Heme/Lymph   Last edited by Theodore Demark, COA on 11/06/2020  9:37 AM. (History)       ALLERGIES Allergies  Allergen Reactions  . Adhesive [Tape] Other (See Comments)    blisters  . Penicillins Diarrhea, Nausea And Vomiting  and Rash    PAST MEDICAL HISTORY Past Medical History:  Diagnosis Date  . COPD (chronic obstructive pulmonary disease) ([place])   . Glaucoma   . Macular degeneration   . Meningioma ([place])   . Rheumatoid arthritis [facility])    Past Surgical History:  Procedure Laterality Date  . APPENDECTOMY    . CESAREAN SECTION     x 3  . CHOLECYSTECTOMY    . CRANIECTOMY / CRANIOTOMY FOR EXCISION OF BRAIN TUMOR     meningioma  . EYE SURGERY    . HYSTEROSCOPY WITH D & C    . INGUINAL HERNIA REPAIR    . YAG LASER APPLICATION      FAMILY HISTORY Family History  Problem Relation Age of Onset  . Other Mother        died  during open heart surgery  . Heart disease Father   . Diabetes Son     SOCIAL HISTORY Social History   Tobacco Use  . Smoking status: Former Research scientist (life sciences)  . Smokeless tobacco: Never Used  Vaping Use  . Vaping Use: Never used         OPHTHALMIC EXAM:  Base Eye Exam    Visual Acuity (Snellen - Linear)      Right Left   Dist cc 20/20 20/50   Dist ph cc  NI   Correction: Glasses       Tonometry (Tonopen, 9:34 AM)      Right Left   Pressure 16 21       Pupils      Dark Light Shape React APD   Right 3 2 Round Brisk None   Left 3 2 Round Brisk None       Visual Fields (Counting fingers)      Left Right    Full Full       Extraocular Movement      Right Left    Full Full       Neuro/Psych    Oriented x3: Yes   Mood/Affect: Normal       Dilation    Both eyes: 1.0% Mydriacyl, 2.5% Phenylephrine @ 9:33 AM        Slit Lamp and Fundus Exam    Slit Lamp Exam      Right Left   Lids/Lashes Dermato Dermato, mild MGD   Conjunctiva/Sclera White and quiet White and quiet   Cornea Mild arcus, trace PEE, mild tear film debris 1+ PEE, mild tear film debris, arcus   Anterior Chamber Deep and quiet Deep and quiet   Iris Round and dilated Round and dilated   Lens PCIOL in excellent position, open pc PCIOL in excellent position   Vitreous syneresis syneresis       Fundus Exam      Right Left   Disc Pink, sharp, +cupping and central pallor, mild temporal PPA Pink and sharp, +cupping   C/D Ratio 0.65 0.6   Macula Blunted foveal reflex, drusen, RPE mottling, clumping and atrophy, +CNV and shallow SRF - stably improved, no heme Flat, blunted foveal reflex, Drusen, RPE mottling, clumping and early atrophy, No heme or edema   Vessels Attenuated and mild tortuosity Attenuated and mild tortuoisty   Periphery Attached, mild peripheral drusen, mild reticular degeneration, focal paving stone inferiorly Attached, mild peripheral drusen and mild reticular degeneration, paving stone  inferiorly        Refraction    Wearing Rx      Sphere Cylinder Axis Add   Right -0.50 +  1.75 010 +2.50   Left -2.50 +2.00 175 +2.50          IMAGING AND PROCEDURES  Imaging and Procedures for 11/06/2020  OCT, Retina - OU - Both Eyes       Right Eye Quality was borderline. Central Foveal Thickness: 295. Progression has been stable. Findings include no IRF, outer retinal atrophy, retinal drusen , no SRF, pigment epithelial detachment, normal foveal contour (Stable improvement in IRF/SRF and overlying SRHM/PED).   Left Eye Quality was good. Central Foveal Thickness: 267. Progression has been stable. Findings include normal foveal contour, no IRF, no SRF, retinal drusen , outer retinal atrophy.   Notes *Images captured and stored on drive  Diagnosis / Impression:  OD: exudative ARMD -- Stable improvement in IRF/SRF and overlying SRHM/PED OS: nonexudative ARMD;  NFP, no IRF/SRF  Clinical management:  See below  Abbreviations: NFP - Normal foveal profile. CME - cystoid macular edema. PED - pigment epithelial detachment. IRF - intraretinal fluid. SRF - subretinal fluid. EZ - ellipsoid zone. ERM - epiretinal membrane. ORA - outer retinal atrophy. ORT - outer retinal tubulation. SRHM - subretinal hyper-reflective material. IRHM - intraretinal hyper-reflective material                 ASSESSMENT/PLAN:    ICD-10-CM   1. Exudative age-related macular degeneration of right eye with active choroidal neovascularization (HCC)  H35.3211 CANCELED: Intravitreal Injection, Pharmacologic Agent - OD - Right Eye  2. Retinal edema  H35.81 OCT, Retina - OU - Both Eyes  3. Intermediate stage nonexudative age-related macular degeneration of left eye  H35.3122   4. Amblyopia of eye, left  H53.002   5. Primary open angle glaucoma (POAG) of both eyes, moderate stage  H40.1132   6. Essential hypertension  I10   7. Hypertensive retinopathy of both eyes  H35.033   8. Pseudophakia of both eyes   Z96.1   9. Dry eyes, bilateral  H04.123    1,2. 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 Age related macular degeneration, non-exudative, OS  - The incidence, anatomy, and pathology of dry AMD, risk of progression, and the AREDS and AREDS 2 studies including smoking risks discussed with patient.  - Recommend amsler grid monitoring  4. Amblyopia OS  - long standing  - baseline BCVA ~20/50-60  - monitor  5. POAG OU  - under the expert management of Dr. Kathlen Mody  - IOP 16,21  6,7. Hypertensive retinopathy OU - discussed importance of tight BP control - monitor  8. Pseudophakia OU  - s/p CE/IOL  - IOL in good position, doing well  - monitor  9. Dry eyes OU (OD>OS) - being actively managed by Dr. Kathlen Mody - corneal surfaces improved today  Ophthalmic Meds Ordered this visit:  No orders of the defined types were placed in this encounter.     Return in about 3 months (around  02/06/2021) for f/u exu ARMD OD, DFE, OCT.  There are no Patient Instructions on file for this visit.  This document serves as a record of services personally performed by Gardiner Sleeper, MD, PhD. It was created on their behalf by San Jetty. Owens Shark, OA an ophthalmic technician. The creation of this record is the provider's dictation and/or activities during the visit.    Electronically signed by: San Jetty. Owens Shark, [place] 06.01.2022 1:19 PM   Gardiner Sleeper, M.D., Ph.D. Diseases & Surgery of the  Retina and Vitreous [facility]  I have reviewed the above documentation for accuracy and completeness, and I agree with the above. Gardiner Sleeper, M.D., Ph.D. 11/06/20 1:19 PM   Abbreviations: M myopia (nearsighted); A astigmatism; H hyperopia (farsighted); P presbyopia; Mrx spectacle prescription;  CTL contact lenses; OD right eye; OS left eye; OU both eyes  XT exotropia; ET esotropia; PEK punctate epithelial keratitis; PEE punctate epithelial erosions; DES dry eye syndrome; MGD meibomian gland dysfunction; ATs artificial tears; PFAT's preservative free artificial tears; [place] nuclear sclerotic cataract; PSC posterior subcapsular cataract; ERM epi-retinal membrane; PVD posterior vitreous detachment; RD retinal detachment; DM diabetes mellitus; DR diabetic retinopathy; NPDR non-proliferative diabetic retinopathy; PDR proliferative diabetic retinopathy; CSME clinically significant macular edema; DME diabetic macular edema; dbh dot blot hemorrhages; CWS cotton wool spot; POAG primary open angle glaucoma; C/D cup-to-disc ratio; HVF humphrey visual field; GVF goldmann visual field; OCT optical coherence tomography; IOP intraocular pressure; BRVO Branch retinal vein occlusion; CRVO central retinal vein occlusion; CRAO central retinal artery occlusion; BRAO branch retinal artery occlusion; RT retinal tear; SB scleral buckle; PPV pars plana vitrectomy; VH Vitreous hemorrhage; PRP panretinal laser photocoagulation; IVK intravitreal kenalog; VMT vitreomacular traction; MH Macular hole;  NVD neovascularization of the disc; NVE neovascularization elsewhere; AREDS age related eye disease study; ARMD age related macular degeneration; POAG primary open angle glaucoma; EBMD epithelial/anterior basement membrane dystrophy; ACIOL anterior chamber intraocular lens; IOL intraocular lens; PCIOL posterior chamber intraocular lens; Phaco/IOL phacoemulsification with intraocular lens placement; [place]  photorefractive keratectomy; LASIK laser assisted in situ keratomileusis; HTN hypertension; DM diabetes mellitus; COPD chronic obstructive pulmonary disease

## 2020-11-06 ENCOUNTER — Other Ambulatory Visit: Payer: Self-pay

## 2020-11-06 ENCOUNTER — Encounter (INDEPENDENT_AMBULATORY_CARE_PROVIDER_SITE_OTHER): Payer: Self-pay | Admitting: Ophthalmology

## 2020-11-06 ENCOUNTER — Ambulatory Visit (INDEPENDENT_AMBULATORY_CARE_PROVIDER_SITE_OTHER): Payer: Medicare Other | Admitting: Ophthalmology

## 2020-11-06 DIAGNOSIS — H353211 Exudative age-related macular degeneration, right eye, with active choroidal neovascularization: Secondary | ICD-10-CM | POA: Diagnosis not present

## 2020-11-06 DIAGNOSIS — H3581 Retinal edema: Secondary | ICD-10-CM

## 2020-11-06 DIAGNOSIS — H35033 Hypertensive retinopathy, bilateral: Secondary | ICD-10-CM

## 2020-11-06 DIAGNOSIS — H401132 Primary open-angle glaucoma, bilateral, moderate stage: Secondary | ICD-10-CM

## 2020-11-06 DIAGNOSIS — Z961 Presence of intraocular lens: Secondary | ICD-10-CM

## 2020-11-06 DIAGNOSIS — H353122 Nonexudative age-related macular degeneration, left eye, intermediate dry stage: Secondary | ICD-10-CM | POA: Diagnosis not present

## 2020-11-06 DIAGNOSIS — H53002 Unspecified amblyopia, left eye: Secondary | ICD-10-CM | POA: Diagnosis not present

## 2020-11-06 DIAGNOSIS — I10 Essential (primary) hypertension: Secondary | ICD-10-CM

## 2020-11-06 DIAGNOSIS — H04123 Dry eye syndrome of bilateral lacrimal glands: Secondary | ICD-10-CM

## 2020-11-18 NOTE — Progress Notes (Signed)
Assessment/Plan:    1.  Essential Tremor  Patient is on primidone, 50 mg, 1/4/tablet at bedtime and propranolol,10 mg three times per day.  These are both very low dosages, but she feels that these help.  She has had side effects with higher dosages when seen at [facility] neurology.  She did not tolerate extremely low-dose topiramate, although I am not really sure that the side effects she reported (GI upset with 25 mg) was really related to the topiramate, as she only took it for 2 days  Subjective:   Kayla Hurley was seen today in follow up for essential tremor.  My previous records were reviewed prior to todays visit. Last visit, patient was interested in trying [employer].  Ultimately, the device was not affordable.  We decided to start low-dose topiramate.  She took it for 2 days and discontinued it.  She was only on 25 mg twice per day.  She thought it upset her stomach.  She went back to taking both propranolol and primidone.  She is going to PT 2 days per week for balance and hip/knee pain.  Current prescribed movement disorder medications: Primidone, 50 mg, 1/4 tablet at bedtime Propranolol, 10 mg 1 tablet tid (finds that she gets low BP if she does it differently)   Previously tried tremor medications: Primidone, 50 mg, half tablet at bedtime ( "it knocks me out" but it helped the tremor at very low dosages - 1/4 tablet); propranolol, 10 mg 3 times per day (BP low); xanax helped it; Topamax, 25 mg twice daily (patient took it for 2 days and felt that it upset her stomach and stopped it)    ALLERGIES:   Allergies  Allergen Reactions   Adhesive [Tape] Other (See Comments)    blisters   Penicillins Diarrhea, Nausea And Vomiting and Rash    CURRENT MEDICATIONS:  Outpatient Encounter Medications as of 11/19/2020  Medication Sig   ALPRAZolam (XANAX) 0.25 MG tablet Take 0.25 mg by mouth at bedtime as needed for anxiety.   Calcium Carb-Cholecalciferol (CALCIUM 1000 + D PO) Take 1  tablet by mouth in the morning and at bedtime.   folic acid (FOLVITE) 1 MG tablet Take 1 mg by mouth daily. Take two tablets daily   leucovorin (WELLCOVORIN) 5 MG tablet Take 5 mg by mouth 2 (two) times a week.   levothyroxine (SYNTHROID) 50 MCG tablet Take 50 mcg by mouth daily before breakfast.   methotrexate (RHEUMATREX) 2.5 MG tablet Take 2.5 mg by mouth 4 (four) times a week. Caution:Chemotherapy. Protect from light.   potassium chloride (KLOR-CON) 20 MEQ packet Take 20 mEq by mouth 2 (two) times daily.   salsalate (DISALCID) 750 MG tablet Take 750 mg by mouth 2 (two) times daily.   simethicone (MYLICON) 80 MG chewable tablet Chew 80 mg by mouth as needed for flatulence.   triamterene-hydrochlorothiazide (MAXZIDE) 75-50 MG tablet Take 0.5 tablets by mouth daily.   [DISCONTINUED] topiramate (TOPAMAX) 25 MG tablet Take 1 tablet (25 mg total) by mouth daily for 7 days, THEN 1 tablet (25 mg total) 2 (two) times daily. (Patient not taking: Reported on 11/19/2020)   No facility-administered encounter medications on file as of 11/19/2020.     Objective:    PHYSICAL EXAMINATION:    VITALS:   Vitals:   11/19/20 1344  BP: 112/70  Pulse: (!) 56  SpO2: 98%  Weight: 158 lb (71.7 kg)  Height: 5\' 2"  (1.575 m)    GEN:  The  patient appears stated age and is in NAD. HEENT:  Normocephalic, atraumatic.  The mucous membranes are moist. The superficial temporal arteries are without ropiness or tenderness. CV:  RRR Lungs:  CTAB Neck/HEME:  There are no carotid bruits bilaterally.  Neurological examination:  Orientation: The patient is alert and oriented x3. Cranial nerves: There is good facial symmetry. The speech is fluent and clear. Soft palate rises symmetrically and there is no tongue deviation. Hearing is intact to conversational tone. Sensation: Sensation is intact to light touch throughout Motor: Strength is at least antigravity x4.  Movement examination: Tone: There is normal tone in  the UE/LE Abnormal movements: no rest tremor.  No postural tremor.  Has significant archimedes spirals trouble on the L but the R is markedly improved compared to last visit Coordination:  There is no decremation with RAM's, with any form of RAMS, including alternating supination and pronation of the forearm, hand opening and closing, finger taps, heel taps and toe taps. Gait and Station: The patient has no difficulty arising out of a deep-seated chair without the use of the hands. The patient ambulates well with her cane    Total time spent on today's visit was 20 minutes, including both face-to-face time and nonface-to-face time.  Time included that spent on review of records (prior notes available to me/labs/imaging if pertinent), discussing treatment and goals, answering patient's questions and coordinating care.  Cc:  Buzzy Han, MD

## 2020-11-19 ENCOUNTER — Encounter: Payer: Self-pay | Admitting: Neurology

## 2020-11-19 ENCOUNTER — Other Ambulatory Visit: Payer: Self-pay

## 2020-11-19 ENCOUNTER — Ambulatory Visit (INDEPENDENT_AMBULATORY_CARE_PROVIDER_SITE_OTHER): Payer: Medicare Other | Admitting: Neurology

## 2020-11-19 VITALS — BP 112/70 | HR 56 | Ht 62.0 in | Wt 158.0 lb

## 2020-11-19 DIAGNOSIS — G250 Essential tremor: Secondary | ICD-10-CM | POA: Diagnosis not present

## 2020-11-19 MED ORDER — PRIMIDONE 50 MG PO TABS: 25.0000 mg | ORAL_TABLET | Freq: Every day | ORAL | 2 refills | Status: DC

## 2020-11-19 MED ORDER — PROPRANOLOL HCL 10 MG PO TABS: 10.0000 mg | ORAL_TABLET | Freq: Three times a day (TID) | ORAL | 1 refills | Status: DC

## 2020-11-19 NOTE — Patient Instructions (Signed)
The physicians and staff at [facility] are committed to providing excellent care. You may receive a survey requesting feedback about your experience at our office. We strive to receive "very good" responses to the survey questions. If you feel that your experience would prevent you from giving the office a "very good " response, please contact our office to try to remedy the situation. We may be reached at [phone]. Thank you for taking the time out of your busy day to complete the survey.  

## 2021-02-02 ENCOUNTER — Encounter (INDEPENDENT_AMBULATORY_CARE_PROVIDER_SITE_OTHER): Payer: Self-pay

## 2021-02-02 ENCOUNTER — Encounter (INDEPENDENT_AMBULATORY_CARE_PROVIDER_SITE_OTHER): Payer: Medicare Other | Admitting: Ophthalmology

## 2021-02-02 DIAGNOSIS — H35033 Hypertensive retinopathy, bilateral: Secondary | ICD-10-CM

## 2021-02-02 DIAGNOSIS — H04123 Dry eye syndrome of bilateral lacrimal glands: Secondary | ICD-10-CM

## 2021-02-02 DIAGNOSIS — H3581 Retinal edema: Secondary | ICD-10-CM

## 2021-02-02 DIAGNOSIS — H353122 Nonexudative age-related macular degeneration, left eye, intermediate dry stage: Secondary | ICD-10-CM

## 2021-02-02 DIAGNOSIS — H353211 Exudative age-related macular degeneration, right eye, with active choroidal neovascularization: Secondary | ICD-10-CM

## 2021-02-02 DIAGNOSIS — H53002 Unspecified amblyopia, left eye: Secondary | ICD-10-CM

## 2021-02-02 DIAGNOSIS — I10 Essential (primary) hypertension: Secondary | ICD-10-CM

## 2021-02-02 DIAGNOSIS — H401132 Primary open-angle glaucoma, bilateral, moderate stage: Secondary | ICD-10-CM

## 2021-02-02 DIAGNOSIS — Z961 Presence of intraocular lens: Secondary | ICD-10-CM

## 2021-02-05 ENCOUNTER — Encounter (INDEPENDENT_AMBULATORY_CARE_PROVIDER_SITE_OTHER): Payer: Medicare Other | Admitting: Ophthalmology

## 2021-02-25 NOTE — Progress Notes (Addendum)
[facility] Note  03/01/2021     CHIEF COMPLAINT Patient presents for Retina Follow Up   HISTORY OF PRESENT ILLNESS: Kayla Hurley is a 86 y.o. female who presents to the clinic today for:   HPI     Retina Follow Up   Patient presents with  Wet AMD.  In right eye.  This started 14 months ago.  Severity is moderate.  Duration of 3.5 months.  Since onset it is stable.  I, the attending physician,  performed the HPI with the patient and updated documentation appropriately.        Comments   86 y/o female pt here for 3.5 mo f/u for exu ARMD OD.  Unsure if VA OD has changed, but needs more light to see.  No change in [place] OS.  Denies pain, FOL, floaters.  AT and allergy gtts prn OU.      Last edited by Bernarda Caffey, MD on 03/01/2021 11:42 AM.    pt states she felt like she would be getting an injection today, bc things in her vision have seemed "off" lately  Referring physician: Hortencia Pilar, MD [place],  [place] [address]  HISTORICAL INFORMATION:   Selected notes from the MEDICAL RECORD NUMBER Referred by Dr. Kathlen Mody for concern of exu ARMD OD   CURRENT MEDICATIONS: No current outpatient medications on file. (Ophthalmic Drugs)   No current facility-administered medications for this visit. (Ophthalmic Drugs)   Current Outpatient Medications (Other)  Medication Sig   ALPRAZolam (XANAX) 0.5 MG tablet    Calcium Carb-Cholecalciferol (CALCIUM 1000 + D PO) Take 1 tablet by mouth in the morning and at bedtime.   calcium citrate (CALCITRATE - DOSED IN MG ELEMENTAL CALCIUM) 950 (200 Ca) MG tablet Take 1 tablet by mouth daily.   Calcium-Phosphorus-Vitamin D (CITRACAL +D3) [phone] MG-MG-UNIT CHEW See admin instructions.   diclofenac Sodium (VOLTAREN) 1 % GEL Apply topically.   ezetimibe (ZETIA) 10 MG tablet Take by mouth.   famotidine (PEPCID) 20 MG tablet Take by mouth.   folic acid (FOLVITE) 1 MG tablet Take 1 mg by  mouth daily. Take two tablets daily   hydrochlorothiazide (HYDRODIURIL) 25 MG tablet Take 25 mg by mouth daily.   Lactobacillus Rhamnosus, GG, (RA PROBIOTIC DIGESTIVE CARE) CAPS Take 1 capsule by mouth daily.   leucovorin (WELLCOVORIN) 5 MG tablet Take 5 mg by mouth 2 (two) times a week.   leucovorin (WELLCOVORIN) 5 MG tablet TAKE 2 TABLETS BY MOUTH ONCE A WEEK   levothyroxine (SYNTHROID) 50 MCG tablet Take 50 mcg by mouth daily before breakfast.   methotrexate (RHEUMATREX) 2.5 MG tablet Take 2.5 mg by mouth 4 (four) times a week. Caution:Chemotherapy. Protect from light.   Multiple Vitamins-Minerals (PRESERVISION AREDS 2+MULTI VIT) CAPS See admin instructions.   Naproxen Sodium 220 MG CAPS Take by mouth.   nicotine polacrilex (COMMIT) 2 MG lozenge Place inside cheek.   potassium chloride (KLOR-CON) 20 MEQ packet Take 20 mEq by mouth 2 (two) times daily.   primidone (MYSOLINE) 50 MG tablet Take 0.5 tablets (25 mg total) by mouth at bedtime.   propranolol (INDERAL) 10 MG tablet Take 1 tablet (10 mg total) by mouth 3 (three) times daily.   salsalate (DISALCID) 750 MG tablet Take 750 mg by mouth 2 (two) times daily.   simethicone (MYLICON) 80 MG chewable tablet Chew 80 mg by mouth as needed for flatulence.   triamterene-hydrochlorothiazide (MAXZIDE) 75-50 MG tablet Take  0.5 tablets by mouth daily.   ALPRAZolam (XANAX) 0.25 MG tablet Take 0.25 mg by mouth at bedtime as needed for anxiety. (Patient not taking: Reported on 03/01/2021)   levothyroxine (SYNTHROID) 50 MCG tablet 1 tablet in the morning on an empty stomach (Patient not taking: Reported on 03/01/2021)   potassium chloride SA (KLOR-CON) 20 MEQ tablet Take by mouth. (Patient not taking: Reported on 03/01/2021)   salsalate (DISALCID) 750 MG tablet Take 2 tablets by mouth 2 (two) times daily. (Patient not taking: Reported on 03/01/2021)   No current facility-administered medications for this visit. (Other)   REVIEW OF SYSTEMS: ROS   Positive  for: Neurological, Musculoskeletal, Eyes, Respiratory Negative for: Constitutional, Gastrointestinal, Skin, Genitourinary, HENT, Endocrine, Cardiovascular, Psychiatric, Allergic/Imm, Heme/Lymph Last edited by Matthew Folks, COA on 03/01/2021 10:13 AM.     ALLERGIES Allergies  Allergen Reactions   Adhesive [Tape] Other (See Comments)    blisters   Penicillins Diarrhea, Nausea And Vomiting and Rash    PAST MEDICAL HISTORY Past Medical History:  Diagnosis Date   COPD (chronic obstructive pulmonary disease) (HCC)    Glaucoma    Hypertensive retinopathy    Macular degeneration    Meningioma (HCC)    Rheumatoid arthritis (HCC)    Past Surgical History:  Procedure Laterality Date   APPENDECTOMY     CATARACT EXTRACTION     CESAREAN SECTION     x 3   CHOLECYSTECTOMY     CRANIECTOMY / CRANIOTOMY FOR EXCISION OF BRAIN TUMOR     meningioma   EYE SURGERY     HYSTEROSCOPY WITH D & C     INGUINAL HERNIA REPAIR     YAG LASER APPLICATION      FAMILY HISTORY Family History  Problem Relation Age of Onset   Other Mother        died during open heart surgery   Heart disease Father    Diabetes Son     SOCIAL HISTORY Social History   Tobacco Use   Smoking status: Former   Smokeless tobacco: Never  Scientific laboratory technician Use: Never used       OPHTHALMIC EXAM:  Base Eye Exam     Visual Acuity (Snellen - Linear)       Right Left   Dist cc 20/20 -2 20/50 +   Dist ph cc  NI    Correction: Glasses         Tonometry (Tonopen, 10:18 AM)       Right Left   Pressure 16 20         Pupils       Dark Light Shape React APD   Right 3 2 Round Brisk None   Left 3 2 Round Brisk None         Visual Fields (Counting fingers)       Left Right    Full Full         Extraocular Movement       Right Left    Full, Ortho Full, Ortho         Neuro/Psych     Oriented x3: Yes   Mood/Affect: Normal         Dilation     Both eyes: 1.0% Mydriacyl, 2.5%  Phenylephrine @ 10:18 AM           Slit Lamp and Fundus Exam     Slit Lamp Exam       Right Left   Lids/Lashes Dermato  Dermato, mild MGD   Conjunctiva/Sclera White and quiet White and quiet   Cornea Mild arcus, trace PEE, mild tear film debris 1+ PEE, mild tear film debris, arcus   Anterior Chamber Deep and quiet Deep and quiet   Iris Round and dilated Round and dilated   Lens PCIOL in excellent position, open pc PCIOL in excellent position   Vitreous syneresis syneresis         Fundus Exam       Right Left   Disc Pink, sharp, +cupping and central pallor, mild temporal PPA Pink and sharp, +cupping   C/D Ratio 0.65 0.6   Macula Blunted foveal reflex, drusen, RPE mottling, clumping and atrophy, +CNV with interval re-development in shallow SRF/edema, no frank heme Flat, blunted foveal reflex, Drusen, RPE mottling, clumping and early atrophy, No heme or edema   Vessels Attenuated and mild tortuosity Attenuated and mild tortuoisty   Periphery Attached, mild peripheral drusen, mild reticular degeneration, focal paving stone inferiorly Attached, mild peripheral drusen and mild reticular degeneration, paving stone inferiorly           IMAGING AND PROCEDURES  Imaging and Procedures for 03/01/2021  OCT, Retina - OU - Both Eyes       Right Eye Quality was good. Central Foveal Thickness: 298. Progression has worsened. Findings include no IRF, outer retinal atrophy, retinal drusen , no SRF, pigment epithelial detachment, normal foveal contour (Interval re-development of SRHM and edema overlying PED IT fovea).   Left Eye Quality was good. Central Foveal Thickness: 271. Progression has been stable. Findings include normal foveal contour, no IRF, no SRF, retinal drusen , outer retinal atrophy.   Notes *Images captured and stored on drive  Diagnosis / Impression:  OD: exudative ARMD -- Interval re-development of SRHM and edema overlying PED IT fovea OS: nonexudative ARMD;  NFP, no  IRF/SRF  Clinical management:  See below  Abbreviations: NFP - Normal foveal profile. CME - cystoid macular edema. PED - pigment epithelial detachment. IRF - intraretinal fluid. SRF - subretinal fluid. EZ - ellipsoid zone. ERM - epiretinal membrane. ORA - outer retinal atrophy. ORT - outer retinal tubulation. SRHM - subretinal hyper-reflective material. IRHM - intraretinal hyper-reflective material      Intravitreal Injection, Pharmacologic Agent - OD - Right Eye       Time Out 03/01/2021. 11:22 AM. Confirmed correct patient, procedure, site, and patient consented.   Anesthesia Topical anesthesia was used. Anesthetic medications included Lidocaine 2%, Proparacaine 0.5%.   Procedure Preparation included 5% betadine to ocular surface, eyelid speculum. A supplied needle was used.   Injection: 1.25 mg Bevacizumab 1.25mg /0.96ml   Route: Intravitreal, Site: Right Eye   NDC: H061816, Lot: [id number] , Expiration date: 04/21/2021, Waste: 0 mL   Post-op Post injection exam found visual acuity of at least counting fingers. The patient tolerated the procedure well. There were no complications. The patient received written and verbal post procedure care education. Post injection medications were not given.            ASSESSMENT/PLAN:    ICD-10-CM   1. Exudative age-related macular degeneration of right eye with active choroidal neovascularization (HCC)  H35.3211 Intravitreal Injection, Pharmacologic Agent - OD - Right Eye    Bevacizumab (AVASTIN) SOLN 1.25 mg    2. Retinal edema  H35.81 OCT, Retina - OU - Both Eyes    3. Intermediate stage nonexudative age-related macular degeneration of left eye  H35.3122     4. Amblyopia of eye, left  H53.002  5. Primary open angle glaucoma (POAG) of both eyes, moderate stage  H40.1132     6. Essential hypertension  I10     7. Hypertensive retinopathy of both eyes  H35.033     8. Pseudophakia of both eyes  Z96.1     9. Dry eyes,  bilateral  H04.123      1,2. Exudative age related macular degeneration, OD  - OCT at presentation 07.29.21 showed CNVM w/shallow SRF OD  - FA 8.30.21 shows focal CNVM             - s/p IVA OD # 1 (07.29.21), #2 (08.30.21), #3 (09.27.21), #4 (11.02.21), #5 (12.13.21), #6 (01.31.22), #7 (03.24.22)  - excellent initial response and maintenance  - BCVA 20/20 (stable)  - OCT shows shows interval re-development of SRHM overlying PED IT fovea at 6 mos since last injection  - discussed findings and treatment options   - recommend IVA OD #8 today, 09.26.22 w/ f/u in 6 wks  - pt in agreement  - RBA of procedure discussed, questions answered - informed consent obtained and signed - see procedure note  - f/u in 6 wks, sooner prn -- DFE, OCT  3. Age related macular degeneration, non-exudative, OS  - The incidence, anatomy, and pathology of dry AMD, risk of progression, and the AREDS and AREDS 2 studies including smoking risks discussed with patient.  - Recommend amsler grid monitoring  4. Amblyopia OS  - long standing  - baseline BCVA ~20/50-60  - monitor  5. POAG OU  - under the expert management of Dr. Kathlen Mody  - IOP 16,20  6,7. Hypertensive retinopathy OU - discussed importance of tight BP control - monitor  8. Pseudophakia OU  - s/p CE/IOL  - IOL in good position, doing well  - monitor  9. Dry eyes OU (OD>OS) - being actively managed by Dr. Kathlen Mody - corneal surfaces stably improved today  Ophthalmic Meds Ordered this visit:  Meds ordered this encounter  Medications   Bevacizumab (AVASTIN) SOLN 1.25 mg       Return in about 6 weeks (around 04/12/2021) for f/u exu ARMD OD, DFE, OCT.  There are no Patient Instructions on file for this visit.  This document serves as a record of services personally performed by Gardiner Sleeper, MD, PhD. It was created on their behalf by Leeann Must, [place], an ophthalmic technician. The creation of this record is the provider's dictation  and/or activities during the visit.    Electronically signed by: Leeann Must, COA @TODAY @ 11:50 AM  Gardiner Sleeper, M.D., Ph.D. Diseases & Surgery of the Retina and [place] 03/01/2021   I have reviewed the above documentation for accuracy and completeness, and I agree with the above. Gardiner Sleeper, M.D., Ph.D. 03/01/21 11:50 AM   Abbreviations: M myopia (nearsighted); A astigmatism; H hyperopia (farsighted); P presbyopia; Mrx spectacle prescription;  CTL contact lenses; OD right eye; OS left eye; OU both eyes  XT exotropia; ET esotropia; PEK punctate epithelial keratitis; PEE punctate epithelial erosions; DES dry eye syndrome; MGD meibomian gland dysfunction; ATs artificial tears; PFAT's preservative free artificial tears; [place] nuclear sclerotic cataract; PSC posterior subcapsular cataract; ERM epi-retinal membrane; PVD posterior vitreous detachment; RD retinal detachment; DM diabetes mellitus; DR diabetic retinopathy; NPDR non-proliferative diabetic retinopathy; PDR proliferative diabetic retinopathy; CSME clinically significant macular edema; DME diabetic macular edema; dbh dot blot hemorrhages; CWS cotton wool spot; POAG primary open angle glaucoma; C/D cup-to-disc ratio; HVF humphrey  visual field; GVF goldmann visual field; OCT optical coherence tomography; IOP intraocular pressure; BRVO Branch retinal vein occlusion; CRVO central retinal vein occlusion; CRAO central retinal artery occlusion; BRAO branch retinal artery occlusion; RT retinal tear; SB scleral buckle; PPV pars plana vitrectomy; VH Vitreous hemorrhage; PRP panretinal laser photocoagulation; IVK intravitreal kenalog; VMT vitreomacular traction; MH Macular hole;  NVD neovascularization of the disc; NVE neovascularization elsewhere; AREDS age related eye disease study; ARMD age related macular degeneration; POAG primary open angle glaucoma; EBMD epithelial/anterior basement membrane dystrophy; ACIOL  anterior chamber intraocular lens; IOL intraocular lens; PCIOL posterior chamber intraocular lens; Phaco/IOL phacoemulsification with intraocular lens placement; [place] photorefractive keratectomy; LASIK laser assisted in situ keratomileusis; HTN hypertension; DM diabetes mellitus; COPD chronic obstructive pulmonary disease

## 2021-03-01 ENCOUNTER — Ambulatory Visit (INDEPENDENT_AMBULATORY_CARE_PROVIDER_SITE_OTHER): Payer: Medicare Other | Admitting: Ophthalmology

## 2021-03-01 ENCOUNTER — Encounter (INDEPENDENT_AMBULATORY_CARE_PROVIDER_SITE_OTHER): Payer: Self-pay | Admitting: Ophthalmology

## 2021-03-01 ENCOUNTER — Other Ambulatory Visit: Payer: Self-pay

## 2021-03-01 DIAGNOSIS — H353211 Exudative age-related macular degeneration, right eye, with active choroidal neovascularization: Secondary | ICD-10-CM | POA: Diagnosis not present

## 2021-03-01 DIAGNOSIS — H35033 Hypertensive retinopathy, bilateral: Secondary | ICD-10-CM | POA: Diagnosis not present

## 2021-03-01 DIAGNOSIS — I10 Essential (primary) hypertension: Secondary | ICD-10-CM | POA: Diagnosis not present

## 2021-03-01 DIAGNOSIS — H04123 Dry eye syndrome of bilateral lacrimal glands: Secondary | ICD-10-CM

## 2021-03-01 DIAGNOSIS — H353122 Nonexudative age-related macular degeneration, left eye, intermediate dry stage: Secondary | ICD-10-CM

## 2021-03-01 DIAGNOSIS — H3581 Retinal edema: Secondary | ICD-10-CM | POA: Diagnosis not present

## 2021-03-01 DIAGNOSIS — H53002 Unspecified amblyopia, left eye: Secondary | ICD-10-CM

## 2021-03-01 DIAGNOSIS — Z961 Presence of intraocular lens: Secondary | ICD-10-CM

## 2021-03-01 DIAGNOSIS — H401132 Primary open-angle glaucoma, bilateral, moderate stage: Secondary | ICD-10-CM | POA: Diagnosis not present

## 2021-03-01 MED ORDER — BEVACIZUMAB CHEMO INJECTION 1.25MG/0.05ML SYRINGE FOR KALEIDOSCOPE
1.2500 mg | INTRAVITREAL | Status: AC | PRN
  Administered 2021-03-01: 1.25 mg via INTRAVITREAL

## 2021-04-08 NOTE — Progress Notes (Shared)
[facility] Note  04/12/2021     CHIEF COMPLAINT Patient presents for No chief complaint on file.   HISTORY OF PRESENT ILLNESS: Kayla Hurley is a 86 y.o. female who presents to the clinic today for:    pt states she felt like she would be getting an injection today, bc things in her vision have seemed "off" lately  Referring physician: Buzzy Han, MD [place],  [place] [address]  HISTORICAL INFORMATION:   Selected notes from the MEDICAL RECORD NUMBER Referred by Dr. Kathlen Mody for concern of exu ARMD OD   CURRENT MEDICATIONS: No current outpatient medications on file. (Ophthalmic Drugs)   No current facility-administered medications for this visit. (Ophthalmic Drugs)   Current Outpatient Medications (Other)  Medication Sig   ALPRAZolam (XANAX) 0.25 MG tablet Take 0.25 mg by mouth at bedtime as needed for anxiety. (Patient not taking: Reported on 03/01/2021)   ALPRAZolam (XANAX) 0.5 MG tablet    Calcium Carb-Cholecalciferol (CALCIUM 1000 + D PO) Take 1 tablet by mouth in the morning and at bedtime.   calcium citrate (CALCITRATE - DOSED IN MG ELEMENTAL CALCIUM) 950 (200 Ca) MG tablet Take 1 tablet by mouth daily.   Calcium-Phosphorus-Vitamin D (CITRACAL +D3) [phone] MG-MG-UNIT CHEW See admin instructions.   diclofenac Sodium (VOLTAREN) 1 % GEL Apply topically.   ezetimibe (ZETIA) 10 MG tablet Take by mouth.   famotidine (PEPCID) 20 MG tablet Take by mouth.   folic acid (FOLVITE) 1 MG tablet Take 1 mg by mouth daily. Take two tablets daily   hydrochlorothiazide (HYDRODIURIL) 25 MG tablet Take 25 mg by mouth daily.   Lactobacillus Rhamnosus, GG, (RA PROBIOTIC DIGESTIVE CARE) CAPS Take 1 capsule by mouth daily.   leucovorin (WELLCOVORIN) 5 MG tablet Take 5 mg by mouth 2 (two) times a week.   leucovorin (WELLCOVORIN) 5 MG tablet TAKE 2 TABLETS BY MOUTH ONCE A WEEK   levothyroxine (SYNTHROID) 50 MCG tablet Take 50 mcg by  mouth daily before breakfast.   levothyroxine (SYNTHROID) 50 MCG tablet 1 tablet in the morning on an empty stomach (Patient not taking: Reported on 03/01/2021)   methotrexate (RHEUMATREX) 2.5 MG tablet Take 2.5 mg by mouth 4 (four) times a week. Caution:Chemotherapy. Protect from light.   Multiple Vitamins-Minerals (PRESERVISION AREDS 2+MULTI VIT) CAPS See admin instructions.   Naproxen Sodium 220 MG CAPS Take by mouth.   nicotine polacrilex (COMMIT) 2 MG lozenge Place inside cheek.   potassium chloride (KLOR-CON) 20 MEQ packet Take 20 mEq by mouth 2 (two) times daily.   potassium chloride SA (KLOR-CON) 20 MEQ tablet Take by mouth. (Patient not taking: Reported on 03/01/2021)   primidone (MYSOLINE) 50 MG tablet Take 0.5 tablets (25 mg total) by mouth at bedtime.   propranolol (INDERAL) 10 MG tablet Take 1 tablet (10 mg total) by mouth 3 (three) times daily.   salsalate (DISALCID) 750 MG tablet Take 750 mg by mouth 2 (two) times daily.   salsalate (DISALCID) 750 MG tablet Take 2 tablets by mouth 2 (two) times daily. (Patient not taking: Reported on 03/01/2021)   simethicone (MYLICON) 80 MG chewable tablet Chew 80 mg by mouth as needed for flatulence.   triamterene-hydrochlorothiazide (MAXZIDE) 75-50 MG tablet Take 0.5 tablets by mouth daily.   No current facility-administered medications for this visit. (Other)   REVIEW OF SYSTEMS:   ALLERGIES Allergies  Allergen Reactions   Adhesive [Tape] Other (See Comments)    blisters   Penicillins Diarrhea, Nausea  And Vomiting and Rash    PAST MEDICAL HISTORY Past Medical History:  Diagnosis Date   COPD (chronic obstructive pulmonary disease) (HCC)    Glaucoma    Hypertensive retinopathy    Macular degeneration    Meningioma (HCC)    Rheumatoid arthritis (HCC)    Past Surgical History:  Procedure Laterality Date   APPENDECTOMY     CATARACT EXTRACTION     CESAREAN SECTION     x 3   CHOLECYSTECTOMY     CRANIECTOMY / CRANIOTOMY FOR  EXCISION OF BRAIN TUMOR     meningioma   EYE SURGERY     HYSTEROSCOPY WITH D & C     INGUINAL HERNIA REPAIR     YAG LASER APPLICATION      FAMILY HISTORY Family History  Problem Relation Age of Onset   Other Mother        died during open heart surgery   Heart disease Father    Diabetes Son     SOCIAL HISTORY Social History   Tobacco Use   Smoking status: Former   Smokeless tobacco: Never  Scientific laboratory technician Use: Never used       OPHTHALMIC EXAM:  Not recorded    IMAGING AND PROCEDURES  Imaging and Procedures for 04/12/2021          ASSESSMENT/PLAN:    ICD-10-CM   1. Exudative age-related macular degeneration of right eye with active choroidal neovascularization ([place])  H35.3211     2. Retinal edema  H35.81     3. Intermediate stage nonexudative age-related macular degeneration of left eye  H35.3122     4. Amblyopia of eye, left  H53.002     5. Primary open angle glaucoma (POAG) of both eyes, moderate stage  H40.1132     6. Essential hypertension  I10     7. Hypertensive retinopathy of both eyes  H35.033     8. Pseudophakia of both eyes  Z96.1     9. Dry eyes, bilateral  H04.123      1,2. 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 Age related macular degeneration,  non-exudative, OS  - The incidence, anatomy, and pathology of dry AMD, risk of progression, and the AREDS and AREDS 2 studies including smoking risks discussed with patient.  - Recommend amsler grid monitoring  4. Amblyopia OS  - long standing  - baseline BCVA ~20/50-60  - monitor  5. POAG OU  - under the expert management of Dr. Kathlen Mody  - IOP 16,20  6,7. Hypertensive retinopathy OU - discussed importance of tight BP control - monitor  8. Pseudophakia OU  - s/p CE/IOL  - IOL in good position,  doing well  - monitor  9. Dry eyes OU (OD>OS) - being actively managed by Dr. Kathlen Mody - corneal surfaces stably improved today  Ophthalmic Meds Ordered this visit:  No orders of the defined types were placed in this encounter.      No follow-ups on file.  There are no Patient Instructions on file for this visit.  This document serves as a record of services personally performed by Gardiner Sleeper, MD, PhD. It was created on their behalf by Leeann Must, [place], an ophthalmic technician. The creation of this record is the provider's dictation and/or activities during the visit.    Electronically signed by: Leeann Must, COA @TODAY @ 9:48 AM   Abbreviations: M myopia (nearsighted); A astigmatism; H hyperopia (farsighted); P presbyopia; Mrx spectacle prescription;  CTL contact lenses; OD right eye; OS left eye; OU both eyes  XT exotropia; ET esotropia; PEK punctate epithelial keratitis; PEE punctate epithelial erosions; DES dry eye syndrome; MGD meibomian gland dysfunction; ATs artificial tears; PFAT's preservative free artificial tears; [place] nuclear sclerotic cataract; PSC posterior subcapsular cataract; ERM epi-retinal membrane; PVD posterior vitreous detachment; RD retinal detachment; DM diabetes mellitus; DR diabetic retinopathy; NPDR non-proliferative diabetic retinopathy; PDR proliferative diabetic retinopathy; CSME clinically significant macular edema; DME diabetic macular edema; dbh  dot blot hemorrhages; CWS cotton wool spot; POAG primary open angle glaucoma; C/D cup-to-disc ratio; HVF humphrey visual field; GVF goldmann visual field; OCT optical coherence tomography; IOP intraocular pressure; BRVO Branch retinal vein occlusion; CRVO central retinal vein occlusion; CRAO central retinal artery occlusion; BRAO branch retinal artery occlusion; RT retinal tear; SB scleral buckle; PPV pars plana vitrectomy; VH Vitreous hemorrhage; PRP panretinal laser photocoagulation; IVK intravitreal kenalog; VMT vitreomacular traction; MH Macular hole;  NVD neovascularization of the disc; NVE neovascularization elsewhere; AREDS age related eye disease study; ARMD age related macular degeneration; POAG primary open angle glaucoma; EBMD epithelial/anterior basement membrane dystrophy; ACIOL anterior chamber intraocular lens; IOL intraocular lens; PCIOL posterior chamber intraocular lens; Phaco/IOL phacoemulsification with intraocular lens placement; [place] photorefractive keratectomy; LASIK laser assisted in situ keratomileusis; HTN hypertension; DM diabetes mellitus; COPD chronic obstructive pulmonary disease

## 2021-04-12 ENCOUNTER — Encounter (INDEPENDENT_AMBULATORY_CARE_PROVIDER_SITE_OTHER): Payer: Medicare Other | Admitting: Ophthalmology

## 2021-04-12 DIAGNOSIS — H53002 Unspecified amblyopia, left eye: Secondary | ICD-10-CM

## 2021-04-12 DIAGNOSIS — H401132 Primary open-angle glaucoma, bilateral, moderate stage: Secondary | ICD-10-CM

## 2021-04-12 DIAGNOSIS — H353122 Nonexudative age-related macular degeneration, left eye, intermediate dry stage: Secondary | ICD-10-CM

## 2021-04-12 DIAGNOSIS — H353211 Exudative age-related macular degeneration, right eye, with active choroidal neovascularization: Secondary | ICD-10-CM

## 2021-04-12 DIAGNOSIS — H35033 Hypertensive retinopathy, bilateral: Secondary | ICD-10-CM

## 2021-04-12 DIAGNOSIS — Z961 Presence of intraocular lens: Secondary | ICD-10-CM

## 2021-04-12 DIAGNOSIS — H3581 Retinal edema: Secondary | ICD-10-CM

## 2021-04-12 DIAGNOSIS — I10 Essential (primary) hypertension: Secondary | ICD-10-CM

## 2021-04-12 DIAGNOSIS — H04123 Dry eye syndrome of bilateral lacrimal glands: Secondary | ICD-10-CM

## 2021-04-13 ENCOUNTER — Encounter (INDEPENDENT_AMBULATORY_CARE_PROVIDER_SITE_OTHER): Payer: Medicare Other | Admitting: Ophthalmology

## 2021-04-14 NOTE — Progress Notes (Addendum)
[facility] Note  04/19/2021     CHIEF COMPLAINT Patient presents for Retina Follow Up   HISTORY OF PRESENT ILLNESS: Kayla Hurley is a 86 y.o. female who presents to the clinic today for:   HPI     Retina Follow Up   Patient presents with  Wet AMD.  Severity is moderate.  Duration of 6 weeks.  Since onset it is stable.  I, the attending physician,  performed the HPI with the patient and updated documentation appropriately.        Comments   6 week retina follow up for wet armd od. Patient states she is able to read some things that she has not been able to before. Dr.Weaver put patient on Timolol bid ou.      Last edited by Bernarda Caffey, MD on 04/19/2021 11:14 PM.     pt delayed from 6 weeks to 7  Referring physician: Buzzy Han, MD [place],  [place] [id number]  HISTORICAL INFORMATION:   Selected notes from the MEDICAL RECORD NUMBER Referred by Dr. Kathlen Mody for concern of exu ARMD OD   CURRENT MEDICATIONS: Current Outpatient Medications (Ophthalmic Drugs)  Medication Sig   timolol (BETIMOL) 0.25 % ophthalmic solution Place 1 drop into both eyes 2 (two) times daily.   No current facility-administered medications for this visit. (Ophthalmic Drugs)   Current Outpatient Medications (Other)  Medication Sig   Calcium Carb-Cholecalciferol (CALCIUM 1000 + D PO) Take 1 tablet by mouth in the morning and at bedtime.   calcium citrate (CALCITRATE - DOSED IN MG ELEMENTAL CALCIUM) 950 (200 Ca) MG tablet Take 1 tablet by mouth daily.   Calcium-Phosphorus-Vitamin D (CITRACAL +D3) [phone] MG-MG-UNIT CHEW See admin instructions.   diclofenac Sodium (VOLTAREN) 1 % GEL Apply topically.   ezetimibe (ZETIA) 10 MG tablet Take by mouth.   famotidine (PEPCID) 20 MG tablet Take by mouth.   folic acid (FOLVITE) 1 MG tablet Take 1 mg by mouth daily. Take two tablets daily   hydrochlorothiazide (HYDRODIURIL) 25 MG tablet Take 25  mg by mouth daily.   Lactobacillus Rhamnosus, GG, (RA PROBIOTIC DIGESTIVE CARE) CAPS Take 1 capsule by mouth daily.   leucovorin (WELLCOVORIN) 5 MG tablet Take 5 mg by mouth 2 (two) times a week.   leucovorin (WELLCOVORIN) 5 MG tablet TAKE 2 TABLETS BY MOUTH ONCE A WEEK   levothyroxine (SYNTHROID) 50 MCG tablet Take 50 mcg by mouth daily before breakfast.   methotrexate (RHEUMATREX) 2.5 MG tablet Take 2.5 mg by mouth 4 (four) times a week. Caution:Chemotherapy. Protect from light.   Multiple Vitamins-Minerals (PRESERVISION AREDS 2+MULTI VIT) CAPS See admin instructions.   Naproxen Sodium 220 MG CAPS Take by mouth.   nicotine polacrilex (COMMIT) 2 MG lozenge Place inside cheek.   potassium chloride (KLOR-CON) 20 MEQ packet Take 20 mEq by mouth 2 (two) times daily.   primidone (MYSOLINE) 50 MG tablet Take 0.5 tablets (25 mg total) by mouth at bedtime.   propranolol (INDERAL) 10 MG tablet Take 1 tablet (10 mg total) by mouth 3 (three) times daily.   salsalate (DISALCID) 750 MG tablet Take 750 mg by mouth 2 (two) times daily.   simethicone (MYLICON) 80 MG chewable tablet Chew 80 mg by mouth as needed for flatulence.   triamterene-hydrochlorothiazide (MAXZIDE) 75-50 MG tablet Take 0.5 tablets by mouth daily.   ALPRAZolam (XANAX) 0.25 MG tablet Take 0.25 mg by mouth at bedtime as needed for anxiety. (Patient not taking: No  sig reported)   ALPRAZolam (XANAX) 0.5 MG tablet    levothyroxine (SYNTHROID) 50 MCG tablet 1 tablet in the morning on an empty stomach (Patient not taking: No sig reported)   potassium chloride SA (KLOR-CON) 20 MEQ tablet Take by mouth. (Patient not taking: No sig reported)   salsalate (DISALCID) 750 MG tablet Take 2 tablets by mouth 2 (two) times daily. (Patient not taking: No sig reported)   No current facility-administered medications for this visit. (Other)   REVIEW OF SYSTEMS: ROS   Positive for: Neurological, Musculoskeletal, Eyes, Respiratory Negative for:  Constitutional, Gastrointestinal, Skin, Genitourinary, HENT, Endocrine, Cardiovascular, Psychiatric, Allergic/Imm, Heme/Lymph Last edited by Elmore Guise, COT on 04/19/2021  9:34 AM.      ALLERGIES Allergies  Allergen Reactions   Adhesive [Tape] Other (See Comments)    blisters   Penicillins Diarrhea, Nausea And Vomiting and Rash    PAST MEDICAL HISTORY Past Medical History:  Diagnosis Date   COPD (chronic obstructive pulmonary disease) (HCC)    Glaucoma    Hypertensive retinopathy    Macular degeneration    Meningioma (HCC)    Rheumatoid arthritis (HCC)    Past Surgical History:  Procedure Laterality Date   APPENDECTOMY     CATARACT EXTRACTION     CESAREAN SECTION     x 3   CHOLECYSTECTOMY     CRANIECTOMY / CRANIOTOMY FOR EXCISION OF BRAIN TUMOR     meningioma   EYE SURGERY     HYSTEROSCOPY WITH D & C     INGUINAL HERNIA REPAIR     YAG LASER APPLICATION      FAMILY HISTORY Family History  Problem Relation Age of Onset   Other Mother        died during open heart surgery   Heart disease Father    Diabetes Son     SOCIAL HISTORY Social History   Tobacco Use   Smoking status: Former   Smokeless tobacco: Never  Scientific laboratory technician Use: Never used       OPHTHALMIC EXAM:  Base Eye Exam     Visual Acuity (Snellen - Linear)       Right Left   Dist cc 20/25+1 20/50+2   Dist ph cc 20/NI 20/NI    Correction: Glasses         Tonometry (Tonopen, 9:45 AM)       Right Left   Pressure 11 13         Pupils       Dark Light Shape React APD   Right 2 1 Round Brisk None   Left 2 1 Round Brisk None         Visual Fields (Counting fingers)       Left Right    Full Full         Extraocular Movement       Right Left    Full, Ortho Full, Ortho         Neuro/Psych     Oriented x3: Yes   Mood/Affect: Normal         Dilation     Both eyes: 1.0% Mydriacyl, 2.5% Phenylephrine @ 9:45 AM           Slit Lamp and Fundus Exam      Slit Lamp Exam       Right Left   Lids/Lashes Dermato Dermato, mild MGD   Conjunctiva/Sclera White and quiet White and quiet   Cornea Mild arcus, trace PEE,  mild tear film debris 1+ PEE, mild tear film debris, arcus   Anterior Chamber Deep and quiet Deep and quiet   Iris Round and dilated Round and dilated   Lens PCIOL in excellent position, open pc PCIOL in excellent position   Vitreous syneresis syneresis         Fundus Exam       Right Left   Disc Pink, sharp, +cupping and central pallor, mild temporal PPA Pink and sharp, +cupping   C/D Ratio 0.65 0.6   Macula Blunted foveal reflex, drusen, RPE mottling, clumping and atrophy, +CNV with interval improvement shallow SRF/edema, no frank heme Flat, blunted foveal reflex, Drusen, RPE mottling, clumping and early atrophy, No heme or edema   Vessels Attenuated and mild tortuosity Attenuated and mild tortuoisty   Periphery Attached, mild peripheral drusen, mild reticular degeneration, focal paving stone inferiorly Attached, mild peripheral drusen and mild reticular degeneration, paving stone inferiorly           Refraction     Wearing Rx       Sphere Cylinder Axis Add   Right -0.50 +1.75 010 +2.50   Left -2.50 +2.00 175 +2.50           IMAGING AND PROCEDURES  Imaging and Procedures for 04/19/2021  OCT, Retina - OU - Both Eyes       Right Eye Quality was good. Central Foveal Thickness: 289. Progression has improved. Findings include no IRF, outer retinal atrophy, retinal drusen , no SRF, pigment epithelial detachment, normal foveal contour (Interval improvement in [facility] and edema overlying PED IT fovea).   Left Eye Quality was good. Central Foveal Thickness: 263. Progression has been stable. Findings include normal foveal contour, no IRF, no SRF, retinal drusen , outer retinal atrophy.   Notes *Images captured and stored on drive  Diagnosis / Impression:  OD: exudative ARMD -- Interval improvement in [facility] and  edema overlying PED IT fovea OS: nonexudative ARMD;  NFP, no IRF/SRF  Clinical management:  See below  Abbreviations: NFP - Normal foveal profile. CME - cystoid macular edema. PED - pigment epithelial detachment. IRF - intraretinal fluid. SRF - subretinal fluid. EZ - ellipsoid zone. ERM - epiretinal membrane. ORA - outer retinal atrophy. ORT - outer retinal tubulation. SRHM - subretinal hyper-reflective material. IRHM - intraretinal hyper-reflective material      Intravitreal Injection, Pharmacologic Agent - OD - Right Eye       Time Out 04/19/2021. 11:00 AM. Confirmed correct patient, procedure, site, and patient consented.   Anesthesia Topical anesthesia was used. Anesthetic medications included Lidocaine 2%, Proparacaine 0.5%.   Procedure Preparation included 5% betadine to ocular surface, eyelid speculum. A supplied needle was used.   Injection: 1.25 mg Bevacizumab 1.25mg /0.17ml   Route: Intravitreal, Site: Right Eye   NDC: H061816, Lot: [id number] , Expiration date: 06/15/2021, Waste: 0 mL   Post-op Post injection exam found visual acuity of at least counting fingers. The patient tolerated the procedure well. There were no complications. The patient received written and verbal post procedure care education. Post injection medications were not given.             ASSESSMENT/PLAN:    ICD-10-CM   1. Exudative age-related macular degeneration of right eye with active choroidal neovascularization (HCC)  H35.3211 Intravitreal Injection, Pharmacologic Agent - OD - Right Eye    Bevacizumab (AVASTIN) SOLN 1.25 mg    2. Retinal edema  H35.81 OCT, Retina - OU - Both Eyes    3. Intermediate  stage nonexudative age-related macular degeneration of left eye  H35.3122     4. Amblyopia of eye, left  H53.002     5. Primary open angle glaucoma (POAG) of both eyes, moderate stage  H40.1132     6. Essential hypertension  I10     7. Hypertensive retinopathy of both eyes  H35.033      8. Pseudophakia of both eyes  Z96.1     9. Dry eyes, bilateral  H04.123       1,2. 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             - s/p IVA OD # 1 (07.29.21), #2 (08.30.21), #3 (09.27.21), #4 (11.02.21), #5 (12.13.21), #6 (01.31.22), #7 (03.24.22), #8 (09.26.22)  - excellent initial response and maintenance  - BCVA 20/25 (stable)  - OCT shows Interval improvement in [facility] and edema overlying PED IT fovea at 7 wks  - discussed findings and treatment options   - recommend IVA OD #9 today, 11.14.22 w/ f/u in 7 wks  - pt in agreement  - RBA of procedure discussed, questions answered - informed consent obtained and signed - see procedure note  - f/u in 7 wks, sooner prn -- DFE, OCT  3. Age related macular degeneration, non-exudative, OS  - The incidence, anatomy, and pathology of dry AMD, risk of progression, and the AREDS and AREDS 2 studies including smoking risks discussed with patient.  - Recommend A[place]ler grid monitoring  4. Amblyopia OS  - long standing  - baseline BCVA ~20/50-60  - monitor  5. POAG OU  - under the expert management of Dr. Kathlen Mody  - on timolol BID OU per Dr. Kathlen Mody  - IOP 11,13  6,7. Hypertensive retinopathy OU - discussed importance of tight BP control - monitor  8. Pseudophakia OU  - s/p CE/IOL  - IOL in good position, doing well  - monitor  9. Dry eyes OU (OD>OS) - being actively managed by Dr. Kathlen Mody - corneal surfaces stably resolved today  Ophthalmic Meds Ordered this visit:  Meds ordered this encounter  Medications   Bevacizumab (AVASTIN) SOLN 1.25 mg     Return in about 7 weeks (around 06/07/2021) for f/u exu ARMD OU, DFE, OCT.  There are no Patient Instructions on file for this visit.  This document serves as a record of services personally performed by  Gardiner Sleeper, MD, PhD. It was created on their behalf by Roselee Nova, COMT. The creation of this record is the provider's dictation and/or activities during the visit.  Electronically signed by: Roselee Nova, COMT 04/19/21 11:22 PM  This document serves as a record of services personally performed by Gardiner Sleeper, MD, PhD. It was created on their behalf by San Jetty. Owens Shark, OA an ophthalmic technician. The creation of this record is the provider's dictation and/or activities during the visit.    Electronically signed by: San Jetty. Owens Shark, [place] 11.14.2022 11:22 PM  Gardiner Sleeper, M.D., Ph.D. Diseases & Surgery of the Retina and Vitreous [facility]  I have reviewed the above documentation for accuracy and completeness, and I agree with the above. Gardiner Sleeper, M.D., Ph.D. 04/19/21 11:22 PM  Abbreviations: M myopia (nearsighted); A astigmatism; H hyperopia (farsighted); P presbyopia; Mrx spectacle prescription;  CTL contact lenses; OD right eye; OS left eye; OU both eyes  XT exotropia; ET esotropia; PEK punctate epithelial keratitis; PEE punctate epithelial erosions; DES dry eye syndrome; MGD meibomian gland dysfunction; ATs artificial tears; PFAT's preservative free artificial tears; [place] nuclear sclerotic cataract; PSC posterior subcapsular cataract; ERM epi-retinal membrane; PVD posterior vitreous detachment; RD retinal detachment; DM diabetes mellitus; DR diabetic retinopathy; NPDR non-proliferative diabetic retinopathy; PDR proliferative diabetic retinopathy; CSME clinically significant macular edema; DME diabetic macular edema; dbh dot blot hemorrhages; CWS cotton wool spot; POAG primary open angle glaucoma; C/D cup-to-disc ratio; HVF humphrey visual field; GVF goldmann visual field; OCT optical coherence tomography; IOP intraocular pressure; BRVO Branch retinal vein occlusion; CRVO central retinal vein occlusion; CRAO central retinal artery occlusion; BRAO branch  retinal artery occlusion; RT retinal tear; SB scleral buckle; PPV pars plana vitrectomy; VH Vitreous hemorrhage; PRP panretinal laser photocoagulation; IVK intravitreal kenalog; VMT vitreomacular traction; MH Macular hole;  NVD neovascularization of the disc; NVE neovascularization elsewhere; AREDS age related eye disease study; ARMD age related macular degeneration; POAG primary open angle glaucoma; EBMD epithelial/anterior basement membrane dystrophy; ACIOL anterior chamber intraocular lens; IOL intraocular lens; PCIOL posterior chamber intraocular lens; Phaco/IOL phacoemulsification with intraocular lens placement; [place] photorefractive keratectomy; LASIK laser assisted in situ keratomileusis; HTN hypertension; DM diabetes mellitus; COPD chronic obstructive pulmonary disease

## 2021-04-19 ENCOUNTER — Ambulatory Visit (INDEPENDENT_AMBULATORY_CARE_PROVIDER_SITE_OTHER): Payer: Medicare Other | Admitting: Ophthalmology

## 2021-04-19 ENCOUNTER — Encounter (INDEPENDENT_AMBULATORY_CARE_PROVIDER_SITE_OTHER): Payer: Self-pay | Admitting: Ophthalmology

## 2021-04-19 ENCOUNTER — Other Ambulatory Visit: Payer: Self-pay

## 2021-04-19 DIAGNOSIS — H353211 Exudative age-related macular degeneration, right eye, with active choroidal neovascularization: Secondary | ICD-10-CM

## 2021-04-19 DIAGNOSIS — H401132 Primary open-angle glaucoma, bilateral, moderate stage: Secondary | ICD-10-CM

## 2021-04-19 DIAGNOSIS — H53002 Unspecified amblyopia, left eye: Secondary | ICD-10-CM

## 2021-04-19 DIAGNOSIS — H35033 Hypertensive retinopathy, bilateral: Secondary | ICD-10-CM | POA: Diagnosis not present

## 2021-04-19 DIAGNOSIS — I10 Essential (primary) hypertension: Secondary | ICD-10-CM

## 2021-04-19 DIAGNOSIS — H04123 Dry eye syndrome of bilateral lacrimal glands: Secondary | ICD-10-CM

## 2021-04-19 DIAGNOSIS — H3581 Retinal edema: Secondary | ICD-10-CM

## 2021-04-19 DIAGNOSIS — H353122 Nonexudative age-related macular degeneration, left eye, intermediate dry stage: Secondary | ICD-10-CM

## 2021-04-19 DIAGNOSIS — Z961 Presence of intraocular lens: Secondary | ICD-10-CM

## 2021-04-19 MED ORDER — BEVACIZUMAB CHEMO INJECTION 1.25MG/0.05ML SYRINGE FOR KALEIDOSCOPE
1.2500 mg | INTRAVITREAL | Status: AC | PRN
  Administered 2021-04-19: 1.25 mg via INTRAVITREAL

## 2021-05-14 DIAGNOSIS — L409 Psoriasis, unspecified: Secondary | ICD-10-CM | POA: Insufficient documentation

## 2021-05-26 ENCOUNTER — Telehealth: Payer: Self-pay | Admitting: Dermatology

## 2021-05-26 NOTE — Telephone Encounter (Signed)
Referral from [facility]. Told her to call them back + see if they can get her in elsewhere before June

## 2021-06-07 NOTE — Progress Notes (Signed)
[facility] Note  06/08/2021     CHIEF COMPLAINT Patient presents for Retina Follow Up  HISTORY OF PRESENT ILLNESS: Kayla Hurley is a 87 y.o. female who presents to the clinic today for:   HPI     Retina Follow Up   Patient presents with  Wet AMD.  In right eye.  Severity is moderate.  Duration of 6 weeks.  Since onset it is stable.  I, the attending physician,  performed the HPI with the patient and updated documentation appropriately.        Comments   Pt here for 7 wk ret f/u for exu ARMD OU. Pt states vision has decreased in terms of NVA vision in OD. She feels this may be a gradual change but she isnt able to see small print like she was able to recently.       Last edited by Bernarda Caffey, MD on 06/08/2021 12:08 PM.    Patient states there is darkness in her house.  She changed her lightbulbs which has helped.   She is currently being treated for vertigo, therapy only.   Referring physician: Buzzy Han, MD [place],  [place] [address]  HISTORICAL INFORMATION:   Selected notes from the MEDICAL RECORD NUMBER Referred by Dr. Kathlen Mody for concern of exu ARMD OD   CURRENT MEDICATIONS: Current Outpatient Medications (Ophthalmic Drugs)  Medication Sig   timolol (BETIMOL) 0.25 % ophthalmic solution Place 1 drop into both eyes 2 (two) times daily.   No current facility-administered medications for this visit. (Ophthalmic Drugs)   Current Outpatient Medications (Other)  Medication Sig   ALPRAZolam (XANAX) 0.5 MG tablet    Calcium Carb-Cholecalciferol (CALCIUM 1000 + D PO) Take 1 tablet by mouth in the morning and at bedtime.   calcium citrate (CALCITRATE - DOSED IN MG ELEMENTAL CALCIUM) 950 (200 Ca) MG tablet Take 1 tablet by mouth daily.   Calcium-Phosphorus-Vitamin D (CITRACAL +D3) [phone] MG-MG-UNIT CHEW See admin instructions.   diclofenac Sodium (VOLTAREN) 1 % GEL Apply topically.   ezetimibe (ZETIA) 10 MG  tablet Take by mouth.   famotidine (PEPCID) 20 MG tablet Take by mouth.   folic acid (FOLVITE) 1 MG tablet Take 1 mg by mouth daily. Take two tablets daily   hydrochlorothiazide (HYDRODIURIL) 25 MG tablet Take 25 mg by mouth daily.   Lactobacillus Rhamnosus, GG, (RA PROBIOTIC DIGESTIVE CARE) CAPS Take 1 capsule by mouth daily.   leucovorin (WELLCOVORIN) 5 MG tablet Take 5 mg by mouth 2 (two) times a week.   leucovorin (WELLCOVORIN) 5 MG tablet TAKE 2 TABLETS BY MOUTH ONCE A WEEK   levothyroxine (SYNTHROID) 50 MCG tablet Take 50 mcg by mouth daily before breakfast.   methotrexate (RHEUMATREX) 2.5 MG tablet Take 2.5 mg by mouth 4 (four) times a week. Caution:Chemotherapy. Protect from light.   Multiple Vitamins-Minerals (PRESERVISION AREDS 2+MULTI VIT) CAPS See admin instructions.   Naproxen Sodium 220 MG CAPS Take by mouth.   nicotine polacrilex (COMMIT) 2 MG lozenge Place inside cheek.   potassium chloride (KLOR-CON) 20 MEQ packet Take 20 mEq by mouth 2 (two) times daily.   potassium chloride SA (KLOR-CON) 20 MEQ tablet Take by mouth.   primidone (MYSOLINE) 50 MG tablet Take 0.5 tablets (25 mg total) by mouth at bedtime.   propranolol (INDERAL) 10 MG tablet Take 1 tablet (10 mg total) by mouth 3 (three) times daily.   salsalate (DISALCID) 750 MG tablet Take 1,500 mg by mouth  2 (two) times daily. Takes 2 tablets TWICE daily.   simethicone (MYLICON) 80 MG chewable tablet Chew 80 mg by mouth as needed for flatulence.   ALPRAZolam (XANAX) 0.25 MG tablet Take 0.25 mg by mouth at bedtime as needed for anxiety. (Patient not taking: No sig reported)   levothyroxine (SYNTHROID) 50 MCG tablet 1 tablet in the morning on an empty stomach (Patient not taking: No sig reported)   salsalate (DISALCID) 750 MG tablet Take 2 tablets by mouth 2 (two) times daily. (Patient not taking: No sig reported)   triamterene-hydrochlorothiazide (MAXZIDE) 75-50 MG tablet Take 0.5 tablets by mouth daily. (Patient not taking:  Reported on 06/08/2021)   No current facility-administered medications for this visit. (Other)   REVIEW OF SYSTEMS: ROS   Positive for: Neurological, Musculoskeletal, Eyes, Respiratory Negative for: Constitutional, Gastrointestinal, Skin, Genitourinary, HENT, Endocrine, Cardiovascular, Psychiatric, Allergic/Imm, Heme/Lymph Last edited by Kingsley Spittle, COT on 06/08/2021  9:52 AM.     ALLERGIES Allergies  Allergen Reactions   Adhesive [Tape] Other (See Comments)    blisters   Penicillins Diarrhea, Nausea And Vomiting and Rash    PAST MEDICAL HISTORY Past Medical History:  Diagnosis Date   COPD (chronic obstructive pulmonary disease) (HCC)    Glaucoma    Hypertensive retinopathy    Macular degeneration    Meningioma (HCC)    Rheumatoid arthritis (HCC)    Past Surgical History:  Procedure Laterality Date   APPENDECTOMY     CATARACT EXTRACTION     CESAREAN SECTION     x 3   CHOLECYSTECTOMY     CRANIECTOMY / CRANIOTOMY FOR EXCISION OF BRAIN TUMOR     meningioma   EYE SURGERY     HYSTEROSCOPY WITH D & C     INGUINAL HERNIA REPAIR     YAG LASER APPLICATION      FAMILY HISTORY Family History  Problem Relation Age of Onset   Other Mother        died during open heart surgery   Heart disease Father    Diabetes Son     SOCIAL HISTORY Social History   Tobacco Use   Smoking status: Former   Smokeless tobacco: Never  Scientific laboratory technician Use: Never used  Substance Use Topics   Alcohol use: Not Currently    Comment: occasional   Drug use: Never       OPHTHALMIC EXAM:  Base Eye Exam     Visual Acuity (Snellen - Linear)       Right Left   Dist cc 20/20 -1 20/50 -1   Dist ph cc NI NI    Correction: Glasses         Tonometry (Tonopen, 10:05 AM)       Right Left   Pressure 17 15         Pupils       Dark Light Shape React APD   Right 2 1 Round Brisk None   Left 2 1 Round Brisk None         Visual Fields (Counting fingers)       Left  Right    Full Full         Extraocular Movement       Right Left    Full, Ortho Full, Ortho         Neuro/Psych     Oriented x3: Yes   Mood/Affect: Normal         Dilation  Both eyes: 1.0% Mydriacyl, 2.5% Phenylephrine @ 10:06 AM           Slit Lamp and Fundus Exam     Slit Lamp Exam       Right Left   Lids/Lashes Dermato Dermato, mild MGD   Conjunctiva/Sclera White and quiet White and quiet   Cornea Mild arcus, trace PEE, mild tear film debris 1+ PEE, mild tear film debris, arcus   Anterior Chamber Deep and quiet Deep and quiet   Iris Round and dilated Round and dilated   Lens PCIOL in excellent position, open pc PCIOL in excellent position   Anterior Vitreous syneresis syneresis         Fundus Exam       Right Left   Disc Pink, sharp, +cupping and central pallor, mild temporal PPA Pink and sharp, +cupping   C/D Ratio 0.65 0.6   Macula Blunted foveal reflex, drusen, RPE mottling, clumping and atrophy, +CNV with stable improvement, shallow SRF/edema, no frank heme Flat, blunted foveal reflex, Drusen, RPE mottling, and clumping, No heme or edema   Vessels Attenuated and mild tortuosity Attenuated and mild tortuoisty   Periphery Attached, mild peripheral drusen, mild reticular degeneration, focal paving stone inferiorly, no heme Attached, mild peripheral drusen and mild reticular degeneration, paving stone inferiorly           Refraction     Wearing Rx       Sphere Cylinder Axis Add   Right -0.50 +1.75 010 +2.50   Left -2.50 +2.00 175 +2.50           IMAGING AND PROCEDURES  Imaging and Procedures for 06/08/2021  OCT, Retina - OU - Both Eyes       Right Eye Quality was good. Central Foveal Thickness: 288. Progression has been stable. Findings include no IRF, outer retinal atrophy, retinal drusen , no SRF, pigment epithelial detachment, normal foveal contour (Stable improvement in [facility] and edema overlying PED IT fovea).   Left  Eye Quality was good. Central Foveal Thickness: 260. Progression has been stable. Findings include normal foveal contour, no IRF, no SRF, retinal drusen , outer retinal atrophy.   Notes *Images captured and stored on drive  Diagnosis / Impression:  OD: exudative ARMD -- Stable improvement in [facility] and edema overlying PED IT fovea OS: nonexudative ARMD;  NFP, no IRF/SRF  Clinical management:  See below  Abbreviations: NFP - Normal foveal profile. CME - cystoid macular edema. PED - pigment epithelial detachment. IRF - intraretinal fluid. SRF - subretinal fluid. EZ - ellipsoid zone. ERM - epiretinal membrane. ORA - outer retinal atrophy. ORT - outer retinal tubulation. SRHM - subretinal hyper-reflective material. IRHM - intraretinal hyper-reflective material      Intravitreal Injection, Pharmacologic Agent - OD - Right Eye       Time Out 06/08/2021. 10:41 AM. Confirmed correct patient, procedure, site, and patient consented.   Anesthesia Topical anesthesia was used. Anesthetic medications included Lidocaine 2%, Proparacaine 0.5%.   Procedure Preparation included 5% betadine to ocular surface, eyelid speculum. A supplied needle was used.   Injection: 1.25 mg Bevacizumab 1.7m/0.05ml   Route: Intravitreal, Site: Right Eye   NDC:: 19758-832-54 Lot: [id number]' , Expiration date: 07/13/2021, Waste: 0 mL   Post-op Post injection exam found visual acuity of at least counting fingers. The patient tolerated the procedure well. There were no complications. The patient received written and verbal post procedure care education. Post injection medications were not given.  ASSESSMENT/PLAN:    ICD-10-CM   1. Exudative age-related macular degeneration of right eye with active choroidal neovascularization (HCC)  H35.3211 OCT, Retina - OU - Both Eyes    Intravitreal Injection, Pharmacologic Agent - OD - Right Eye    Bevacizumab (AVASTIN) SOLN 1.25 mg    2. Intermediate stage  nonexudative age-related macular degeneration of left eye  H35.3122     3. Amblyopia of eye, left  H53.002     4. Primary open angle glaucoma (POAG) of both eyes, moderate stage  H40.1132     5. Essential hypertension  I10     6. Hypertensive retinopathy of both eyes  H35.033     7. Pseudophakia of both eyes  Z96.1     8. Dry eyes, bilateral  H04.123       1. 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             - s/p IVA OD # 1 (07.29.21), #2 (08.30.21), #3 (09.27.21), #4 (11.02.21), #5 (12.13.21), #6 (01.31.22), #7 (03.24.22), #8 (09.26.22), #9 (11.14.22)  - excellent initial response and maintenance  - BCVA 20/20, improved from 20/25, will extend to 8 weeks  - OCT shows Stable improvement in [facility] and edema overlying PED IT fovea at 7 wks x2  - discussed findings and treatment options   - recommend IVA OD #10 today, 1.3.23 w/ f/u in 8 wks  - pt in agreement  - RBA of procedure discussed, questions answered - informed consent obtained and signed - see procedure note  - f/u in 8 wks, sooner prn -- DFE, OCT  2. Age related macular degeneration, non-exudative, OS  - The incidence, anatomy, and pathology of dry AMD, risk of progression, and the AREDS and AREDS 2 studies including smoking risks discussed with patient.  - Recommend A[place]ler grid monitoring  3. Amblyopia OS  - long standing  - baseline BCVA ~20/50-60  - monitor  4. POAG OU  - was under the expert management of Dr. Kathlen Mody  - on timolol BID OU per Dr. Kathlen Mody  - IOP 17, 15  5,6. Hypertensive retinopathy OU - discussed importance of tight BP control - monitor  7. Pseudophakia OU  - s/p CE/IOL  - IOL in good position, doing well  - monitor  8. Dry eyes OU (OD>OS) - previously managed by Dr. Kathlen Mody   Ophthalmic Meds Ordered this visit:  Meds ordered this encounter  Medications   Bevacizumab (AVASTIN) SOLN  1.25 mg     Return in about 8 weeks (around 08/03/2021) for DFE, OCT, possible injection.  There are no Patient Instructions on file for this visit.  This document serves as a record of services personally performed by Gardiner Sleeper, MD, PhD. It was created on their behalf by Estill Bakes, COT an ophthalmic technician. The creation of this record is the provider's dictation and/or activities during the visit.    Electronically signed by: Estill Bakes, COT 1.2.23 @ 12:12 PM   This document serves as a record of services personally performed by Gardiner Sleeper, MD, PhD. It was created on their behalf by Leonie Douglas, an ophthalmic technician. The creation of this record is the provider's dictation and/or activities during the visit.    Electronically signed by: Leonie Douglas COA, 06/08/21  12:12 PM  Gardiner Sleeper, M.D., Ph.D. Diseases & Surgery of the Retina and Vitreous  [place] 06/08/2021  I have reviewed the above documentation for accuracy and completeness, and I agree with the above. Gardiner Sleeper, M.D., Ph.D. 06/08/21 12:12 PM   Abbreviations: M myopia (nearsighted); A astigmatism; H hyperopia (farsighted); P presbyopia; Mrx spectacle prescription;  CTL contact lenses; OD right eye; OS left eye; OU both eyes  XT exotropia; ET esotropia; PEK punctate epithelial keratitis; PEE punctate epithelial erosions; DES dry eye syndrome; MGD meibomian gland dysfunction; ATs artificial tears; PFAT's preservative free artificial tears; [place] nuclear sclerotic cataract; PSC posterior subcapsular cataract; ERM epi-retinal membrane; PVD posterior vitreous detachment; RD retinal detachment; DM diabetes mellitus; DR diabetic retinopathy; NPDR non-proliferative diabetic retinopathy; PDR proliferative diabetic retinopathy; CSME clinically significant macular edema; DME diabetic macular edema; dbh dot blot hemorrhages; CWS cotton wool spot; POAG primary open angle glaucoma; C/D  cup-to-disc ratio; HVF humphrey visual field; GVF goldmann visual field; OCT optical coherence tomography; IOP intraocular pressure; BRVO Branch retinal vein occlusion; CRVO central retinal vein occlusion; CRAO central retinal artery occlusion; BRAO branch retinal artery occlusion; RT retinal tear; SB scleral buckle; PPV pars plana vitrectomy; VH Vitreous hemorrhage; PRP panretinal laser photocoagulation; IVK intravitreal kenalog; VMT vitreomacular traction; MH Macular hole;  NVD neovascularization of the disc; NVE neovascularization elsewhere; AREDS age related eye disease study; ARMD age related macular degeneration; POAG primary open angle glaucoma; EBMD epithelial/anterior basement membrane dystrophy; ACIOL anterior chamber intraocular lens; IOL intraocular lens; PCIOL posterior chamber intraocular lens; Phaco/IOL phacoemulsification with intraocular lens placement; [place] photorefractive keratectomy; LASIK laser assisted in situ keratomileusis; HTN hypertension; DM diabetes mellitus; COPD chronic obstructive pulmonary disease

## 2021-06-08 ENCOUNTER — Ambulatory Visit (INDEPENDENT_AMBULATORY_CARE_PROVIDER_SITE_OTHER): Payer: Medicare Other | Admitting: Ophthalmology

## 2021-06-08 ENCOUNTER — Other Ambulatory Visit: Payer: Self-pay

## 2021-06-08 ENCOUNTER — Encounter (INDEPENDENT_AMBULATORY_CARE_PROVIDER_SITE_OTHER): Payer: Self-pay | Admitting: Ophthalmology

## 2021-06-08 DIAGNOSIS — H353122 Nonexudative age-related macular degeneration, left eye, intermediate dry stage: Secondary | ICD-10-CM

## 2021-06-08 DIAGNOSIS — H53002 Unspecified amblyopia, left eye: Secondary | ICD-10-CM

## 2021-06-08 DIAGNOSIS — Z961 Presence of intraocular lens: Secondary | ICD-10-CM

## 2021-06-08 DIAGNOSIS — H401132 Primary open-angle glaucoma, bilateral, moderate stage: Secondary | ICD-10-CM

## 2021-06-08 DIAGNOSIS — H04123 Dry eye syndrome of bilateral lacrimal glands: Secondary | ICD-10-CM

## 2021-06-08 DIAGNOSIS — H353211 Exudative age-related macular degeneration, right eye, with active choroidal neovascularization: Secondary | ICD-10-CM

## 2021-06-08 DIAGNOSIS — I10 Essential (primary) hypertension: Secondary | ICD-10-CM | POA: Diagnosis not present

## 2021-06-08 DIAGNOSIS — H35033 Hypertensive retinopathy, bilateral: Secondary | ICD-10-CM

## 2021-06-08 MED ORDER — BEVACIZUMAB CHEMO INJECTION 1.25MG/0.05ML SYRINGE FOR KALEIDOSCOPE
1.2500 mg | INTRAVITREAL | Status: AC | PRN
  Administered 2021-06-08: 1.25 mg via INTRAVITREAL

## 2021-07-29 NOTE — Progress Notes (Signed)
[facility] Note  08/02/2021     CHIEF COMPLAINT Patient presents for Retina Follow Up  HISTORY OF PRESENT ILLNESS: Kayla Hurley is a 87 y.o. female who presents to the clinic today for:   HPI     Retina Follow Up   Patient presents with  Wet AMD.  In right eye.  This started 8 weeks ago.  I, the attending physician,  performed the HPI with the patient and updated documentation appropriately.        Comments   Patient here for 8 weeks retina follow up for exu ARMD OD. Patient states vision is worse. Tested ok when went to glaucoma doctor. No eye pain. Had floaters longer that had before.      Last edited by Bernarda Caffey, MD on 08/02/2021 11:58 AM.     Patient states her vision gets "cloudy and foggy", she feels like she had floaters for a long time after her last injection  Referring physician: Buzzy Han, MD [place],  [place] [address]  HISTORICAL INFORMATION:   Selected notes from the MEDICAL RECORD NUMBER Referred by Dr. Kathlen Mody for concern of exu ARMD OD   CURRENT MEDICATIONS: Current Outpatient Medications (Ophthalmic Drugs)  Medication Sig   timolol (BETIMOL) 0.25 % ophthalmic solution Place 1 drop into both eyes 2 (two) times daily.   No current facility-administered medications for this visit. (Ophthalmic Drugs)   Current Outpatient Medications (Other)  Medication Sig   ALPRAZolam (XANAX) 0.5 MG tablet    Calcium Carb-Cholecalciferol (CALCIUM 1000 + D PO) Take 1 tablet by mouth in the morning and at bedtime.   calcium citrate (CALCITRATE - DOSED IN MG ELEMENTAL CALCIUM) 950 (200 Ca) MG tablet Take 1 tablet by mouth daily.   Calcium-Phosphorus-Vitamin D (CITRACAL +D3) [phone] MG-MG-UNIT CHEW See admin instructions.   diclofenac Sodium (VOLTAREN) 1 % GEL Apply topically.   ezetimibe (ZETIA) 10 MG tablet Take by mouth.   famotidine (PEPCID) 20 MG tablet Take by mouth.   folic acid (FOLVITE) 1 MG  tablet Take 1 mg by mouth daily. Take two tablets daily   hydrochlorothiazide (HYDRODIURIL) 25 MG tablet Take 25 mg by mouth daily.   Lactobacillus Rhamnosus, GG, (RA PROBIOTIC DIGESTIVE CARE) CAPS Take 1 capsule by mouth daily.   leucovorin (WELLCOVORIN) 5 MG tablet Take 5 mg by mouth 2 (two) times a week.   leucovorin (WELLCOVORIN) 5 MG tablet TAKE 2 TABLETS BY MOUTH ONCE A WEEK   levothyroxine (SYNTHROID) 50 MCG tablet Take 50 mcg by mouth daily before breakfast.   methotrexate (RHEUMATREX) 2.5 MG tablet Take 2.5 mg by mouth 4 (four) times a week. Caution:Chemotherapy. Protect from light.   Multiple Vitamins-Minerals (PRESERVISION AREDS 2+MULTI VIT) CAPS See admin instructions.   Naproxen Sodium 220 MG CAPS Take by mouth.   nicotine polacrilex (COMMIT) 2 MG lozenge Place inside cheek.   potassium chloride (KLOR-CON) 20 MEQ packet Take 20 mEq by mouth 2 (two) times daily.   potassium chloride SA (KLOR-CON) 20 MEQ tablet Take by mouth.   primidone (MYSOLINE) 50 MG tablet Take 0.5 tablets (25 mg total) by mouth at bedtime.   propranolol (INDERAL) 10 MG tablet Take 1 tablet (10 mg total) by mouth 3 (three) times daily.   salsalate (DISALCID) 750 MG tablet Take 1,500 mg by mouth 2 (two) times daily. Takes 2 tablets TWICE daily.   simethicone (MYLICON) 80 MG chewable tablet Chew 80 mg by mouth as needed for flatulence.  ALPRAZolam (XANAX) 0.25 MG tablet Take 0.25 mg by mouth at bedtime as needed for anxiety. (Patient not taking: Reported on 03/01/2021)   levothyroxine (SYNTHROID) 50 MCG tablet 1 tablet in the morning on an empty stomach (Patient not taking: Reported on 03/01/2021)   salsalate (DISALCID) 750 MG tablet Take 2 tablets by mouth 2 (two) times daily. (Patient not taking: Reported on 03/01/2021)   triamterene-hydrochlorothiazide (MAXZIDE) 75-50 MG tablet Take 0.5 tablets by mouth daily. (Patient not taking: Reported on 06/08/2021)   No current facility-administered medications for this visit.  (Other)   REVIEW OF SYSTEMS: ROS   Positive for: Neurological, Musculoskeletal, Eyes, Respiratory Negative for: Constitutional, Gastrointestinal, Skin, Genitourinary, HENT, Endocrine, Cardiovascular, Psychiatric, Allergic/Imm, Heme/Lymph Last edited by Theodore Demark, COA on 08/02/2021  9:43 AM.     ALLERGIES Allergies  Allergen Reactions   Adhesive [Tape] Other (See Comments)    blisters   Penicillins Diarrhea, Nausea And Vomiting and Rash   PAST MEDICAL HISTORY Past Medical History:  Diagnosis Date   COPD (chronic obstructive pulmonary disease) (HCC)    Glaucoma    Hypertensive retinopathy    Macular degeneration    Meningioma (HCC)    Rheumatoid arthritis (HCC)    Past Surgical History:  Procedure Laterality Date   APPENDECTOMY     CATARACT EXTRACTION     CESAREAN SECTION     x 3   CHOLECYSTECTOMY     CRANIECTOMY / CRANIOTOMY FOR EXCISION OF BRAIN TUMOR     meningioma   EYE SURGERY     HYSTEROSCOPY WITH D & C     INGUINAL HERNIA REPAIR     YAG LASER APPLICATION     FAMILY HISTORY Family History  Problem Relation Age of Onset   Other Mother        died during open heart surgery   Heart disease Father    Diabetes Son     SOCIAL HISTORY Social History   Tobacco Use   Smoking status: Former   Smokeless tobacco: Never  Scientific laboratory technician Use: Never used  Substance Use Topics   Alcohol use: Not Currently    Comment: occasional   Drug use: Never       OPHTHALMIC EXAM:  Base Eye Exam     Visual Acuity (Snellen - Linear)       Right Left   Dist cc 20/20 20/40 -2    Correction: Glasses         Tonometry (Tonopen, 9:41 AM)       Right Left   Pressure 18 19         Pupils       Dark Light Shape React APD   Right 2 1 Round Brisk None   Left 2 1 Round Brisk None         Visual Fields (Counting fingers)       Left Right    Full Full         Extraocular Movement       Right Left    Full, Ortho Full, Ortho          Neuro/Psych     Oriented x3: Yes   Mood/Affect: Normal         Dilation     Both eyes: 1.0% Mydriacyl, 2.5% Phenylephrine @ 9:41 AM           Slit Lamp and Fundus Exam     Slit Lamp Exam       Right  Left   Lids/Lashes Dermato Dermato, mild MGD   Conjunctiva/Sclera White and quiet White and quiet   Cornea Mild arcus, trace PEE, mild tear film debris 1+ PEE, mild tear film debris, arcus   Anterior Chamber Deep and quiet Deep and quiet   Iris Round and dilated Round and dilated   Lens PCIOL in excellent position, open pc PCIOL in excellent position   Anterior Vitreous syneresis syneresis         Fundus Exam       Right Left   Disc Pink, sharp, +cupping and central pallor, mild temporal PPA Pink and sharp, +cupping   C/D Ratio 0.65 0.6   Macula Blunted foveal reflex, drusen, RPE mottling, clumping and atrophy, +CNV with stable improvement in shallow SRF/edema, no frank heme Flat, blunted foveal reflex, Drusen, RPE mottling, clumping and atrophy, small patch of GA superior to fovea, No heme or edema   Vessels Attenuated and mild tortuosity Attenuated and mild tortuoisty   Periphery Attached, mild peripheral drusen, mild reticular degeneration, focal paving stone inferiorly, no heme Attached, mild peripheral drusen and mild reticular degeneration, paving stone inferiorly           Refraction     Wearing Rx       Sphere Cylinder Axis Add   Right -0.50 +1.75 010 +2.50   Left -2.50 +2.00 175 +2.50           IMAGING AND PROCEDURES  Imaging and Procedures for 08/02/2021  OCT, Retina - OU - Both Eyes       Right Eye Quality was good. Central Foveal Thickness: 284. Progression has been stable. Findings include no IRF, outer retinal atrophy, retinal drusen , no SRF, pigment epithelial detachment, normal foveal contour (Stable improvement in [facility] and edema overlying PED IT fovea).   Left Eye Quality was borderline. Central Foveal Thickness: 309. Progression has  been stable. Findings include normal foveal contour, no IRF, no SRF, retinal drusen , outer retinal atrophy.   Notes *Images captured and stored on drive  Diagnosis / Impression:  OD: exudative ARMD -- Stable improvement in [facility] and edema overlying PED IT fovea OS: nonexudative ARMD;  NFP, no IRF/SRF  Clinical management:  See below  Abbreviations: NFP - Normal foveal profile. CME - cystoid macular edema. PED - pigment epithelial detachment. IRF - intraretinal fluid. SRF - subretinal fluid. EZ - ellipsoid zone. ERM - epiretinal membrane. ORA - outer retinal atrophy. ORT - outer retinal tubulation. SRHM - subretinal hyper-reflective material. IRHM - intraretinal hyper-reflective material      Intravitreal Injection, Pharmacologic Agent - OD - Right Eye       Time Out 08/02/2021. 10:39 AM. Confirmed correct patient, procedure, site, and patient consented.   Anesthesia Topical anesthesia was used. Anesthetic medications included Lidocaine 2%, Proparacaine 0.5%.   Procedure Preparation included 5% betadine to ocular surface, eyelid speculum. A supplied needle was used.   Injection: 1.25 mg Bevacizumab 1.25mg /0.47ml   Route: Intravitreal, Site: Right Eye   NDC: H061816, Lot: [id number] , Expiration date: 09/01/2021   Post-op Post injection exam found visual acuity of at least counting fingers. The patient tolerated the procedure well. There were no complications. The patient received written and verbal post procedure care education. Post injection medications were not given.             ASSESSMENT/PLAN:    ICD-10-CM   1. Exudative age-related macular degeneration of right eye with active choroidal neovascularization ([place])  H35.3211 OCT, Retina - OU -  Both Eyes    Intravitreal Injection, Pharmacologic Agent - OD - Right Eye    Bevacizumab (AVASTIN) SOLN 1.25 mg    2. Intermediate stage nonexudative age-related macular degeneration of left eye  H35.3122     3. Amblyopia  of eye, left  H53.002     4. Primary open angle glaucoma (POAG) of both eyes, moderate stage  H40.1132     5. Essential hypertension  I10     6. Hypertensive retinopathy of both eyes  H35.033     7. Pseudophakia of both eyes  Z96.1     8. Dry eyes, bilateral  H04.123      1. 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             - s/p IVA OD # 1 (07.29.21), #2 (08.30.21), #3 (09.27.21), #4 (11.02.21), #5 (12.13.21), #6 (01.31.22), #7 (03.24.22), #8 (09.26.22), #9 (11.14.22), #10 (01.03.23)  - excellent initial response and maintenance  - BCVA 20/20, stable  - OCT shows Stable improvement in [facility] and edema overlying PED IT fovea at 8 wks  - discussed findings and treatment options   - recommend IVA OD #11 today, 02.27.23 w/ f/u in 10 wks  - pt in agreement  - RBA of procedure discussed, questions answered - informed consent obtained and signed - see procedure note  - f/u in 10 wks, sooner prn -- DFE, OCT  2. Age related macular degeneration, non-exudative, OS  - The incidence, anatomy, and pathology of dry AMD, risk of progression, and the AREDS and AREDS 2 studies including smoking risks discussed with patient.  - recommend Amsler grid monitoring  3. Amblyopia OS  - long standing  - baseline BCVA ~20/50-60  - monitor  4. POAG OU  - was under the expert management of Dr. Kathlen Mody  - on timolol BID OU per Dr. Kathlen Mody  - IOP  5,6. Hypertensive retinopathy OU - discussed importance of tight BP control - monitor  7. Pseudophakia OU  - s/p CE/IOL  - IOL in good position, doing well  - monitor  8. Dry eyes OU (OD>OS)  - previously managed by Dr. Kathlen Mody   Ophthalmic Meds Ordered this visit:  Meds ordered this encounter  Medications   Bevacizumab (AVASTIN) SOLN 1.25 mg     Return in about 10 weeks (around 10/11/2021) for f/u exu ARMD OD, DFE, OCT.  There are no Patient  Instructions on file for this visit.  This document serves as a record of services personally performed by Gardiner Sleeper, MD, PhD. It was created on their behalf by Roselee Nova, COMT. The creation of this record is the provider's dictation and/or activities during the visit.  Electronically signed by: Roselee Nova, COMT 08/02/21 12:08 PM  This document serves as a record of services personally performed by Gardiner Sleeper, MD, PhD. It was created on their behalf by San Jetty. Owens Shark, OA an ophthalmic technician. The creation of this record is the provider's dictation and/or activities during the visit.    Electronically signed by: San Jetty. Owens Shark, [place] 02.27.2023 12:08 PM   Gardiner Sleeper, M.D., Ph.D. Diseases & Surgery of the Retina and Vitreous [facility]  I have reviewed the above documentation for accuracy and completeness, and I agree with the above. Gardiner Sleeper, M.D., Ph.D. 08/02/21 12:09 PM  Abbreviations: M myopia (nearsighted); A  astigmatism; H hyperopia (farsighted); P presbyopia; Mrx spectacle prescription;  CTL contact lenses; OD right eye; OS left eye; OU both eyes  XT exotropia; ET esotropia; PEK punctate epithelial keratitis; PEE punctate epithelial erosions; DES dry eye syndrome; MGD meibomian gland dysfunction; ATs artificial tears; PFAT's preservative free artificial tears; [place] nuclear sclerotic cataract; PSC posterior subcapsular cataract; ERM epi-retinal membrane; PVD posterior vitreous detachment; RD retinal detachment; DM diabetes mellitus; DR diabetic retinopathy; NPDR non-proliferative diabetic retinopathy; PDR proliferative diabetic retinopathy; CSME clinically significant macular edema; DME diabetic macular edema; dbh dot blot hemorrhages; CWS cotton wool spot; POAG primary open angle glaucoma; C/D cup-to-disc ratio; HVF humphrey visual field; GVF goldmann visual field; OCT optical coherence tomography; IOP intraocular pressure; BRVO Branch  retinal vein occlusion; CRVO central retinal vein occlusion; CRAO central retinal artery occlusion; BRAO branch retinal artery occlusion; RT retinal tear; SB scleral buckle; PPV pars plana vitrectomy; VH Vitreous hemorrhage; PRP panretinal laser photocoagulation; IVK intravitreal kenalog; VMT vitreomacular traction; MH Macular hole;  NVD neovascularization of the disc; NVE neovascularization elsewhere; AREDS age related eye disease study; ARMD age related macular degeneration; POAG primary open angle glaucoma; EBMD epithelial/anterior basement membrane dystrophy; ACIOL anterior chamber intraocular lens; IOL intraocular lens; PCIOL posterior chamber intraocular lens; Phaco/IOL phacoemulsification with intraocular lens placement; [place] photorefractive keratectomy; LASIK laser assisted in situ keratomileusis; HTN hypertension; DM diabetes mellitus; COPD chronic obstructive pulmonary disease

## 2021-08-02 ENCOUNTER — Encounter (INDEPENDENT_AMBULATORY_CARE_PROVIDER_SITE_OTHER): Payer: Self-pay | Admitting: Ophthalmology

## 2021-08-02 ENCOUNTER — Ambulatory Visit (INDEPENDENT_AMBULATORY_CARE_PROVIDER_SITE_OTHER): Payer: Medicare Other | Admitting: Ophthalmology

## 2021-08-02 ENCOUNTER — Other Ambulatory Visit: Payer: Self-pay

## 2021-08-02 DIAGNOSIS — Z961 Presence of intraocular lens: Secondary | ICD-10-CM

## 2021-08-02 DIAGNOSIS — H35033 Hypertensive retinopathy, bilateral: Secondary | ICD-10-CM

## 2021-08-02 DIAGNOSIS — H04123 Dry eye syndrome of bilateral lacrimal glands: Secondary | ICD-10-CM

## 2021-08-02 DIAGNOSIS — H401132 Primary open-angle glaucoma, bilateral, moderate stage: Secondary | ICD-10-CM

## 2021-08-02 DIAGNOSIS — H353211 Exudative age-related macular degeneration, right eye, with active choroidal neovascularization: Secondary | ICD-10-CM

## 2021-08-02 DIAGNOSIS — H353122 Nonexudative age-related macular degeneration, left eye, intermediate dry stage: Secondary | ICD-10-CM

## 2021-08-02 DIAGNOSIS — I10 Essential (primary) hypertension: Secondary | ICD-10-CM | POA: Diagnosis not present

## 2021-08-02 DIAGNOSIS — H53002 Unspecified amblyopia, left eye: Secondary | ICD-10-CM

## 2021-08-02 MED ORDER — BEVACIZUMAB CHEMO INJECTION 1.25MG/0.05ML SYRINGE FOR KALEIDOSCOPE
1.2500 mg | INTRAVITREAL | Status: AC | PRN
  Administered 2021-08-02: 1.25 mg via INTRAVITREAL

## 2021-08-26 ENCOUNTER — Other Ambulatory Visit: Payer: Self-pay

## 2021-08-26 ENCOUNTER — Ambulatory Visit (INDEPENDENT_AMBULATORY_CARE_PROVIDER_SITE_OTHER): Payer: Medicare Other | Admitting: Pulmonary Disease

## 2021-08-26 ENCOUNTER — Encounter: Payer: Self-pay | Admitting: Pulmonary Disease

## 2021-08-26 VITALS — BP 146/72 | HR 74 | Temp 98.2°F | Ht 62.0 in | Wt 150.2 lb

## 2021-08-26 DIAGNOSIS — R053 Chronic cough: Secondary | ICD-10-CM | POA: Insufficient documentation

## 2021-08-26 DIAGNOSIS — J449 Chronic obstructive pulmonary disease, unspecified: Secondary | ICD-10-CM | POA: Insufficient documentation

## 2021-08-26 NOTE — Progress Notes (Signed)
? ? ?Subjective:  ? ?PATIENT ID: Kayla Hurley GENDER: female DOB: 06/06/1935, MRN: [id number] ? ? ?HPI ? ?Chief Complaint  ?Patient presents with  ? Consult  ?  Patient is here to talk about COPD  ? ? ?Reason for Visit: New consult for COPD, cough ? ?Ms. Kayla Hurley is a 87 year old female with COPD, RA on methotrexate, osteoporosis, HTN, hypothyroidism, B12 deficiency and essential tremor who presents as a new consult. ? ?Faxed records from [facility] reviewed. She was referred to Pulmonary for PFTs and albuterol trial. Has had a cough thought to be related to COPD vs GERD. She was advised to trial omeprazole as well.  ? ?She was diagnosed with COPD three years ago. Symptoms occur at night including a dry cough with chest congestion. Denies wheezing. Limited activity due to arthritis but able to participate in physical therapy twice a week 45-60 minutes. Does not take mucinex. She reports nasal congestion with post nasal drip ? ?Social History: ?Former smoker. Starting at 87 years old. Unsure how much she smoked. Quit 30 years ago. 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 ?I have personally reviewed patient's past medical/family/social history, allergies, current medications. ? ?Past Medical History:  ?Diagnosis Date  ? COPD (chronic obstructive pulmonary disease) ([place])   ? Glaucoma   ? Hypertensive retinopathy   ? Macular degeneration   ? Meningioma ([place])   ? Rheumatoid arthritis ([place])   ?  ? ?Family History  ?Problem Relation Age of Onset  ? Other Mother   ?     died during open heart surgery  ? Heart disease Father   ? Diabetes Son   ?  ? ?Social History  ? ?Occupational History  ? Not on file  ?Tobacco Use  ? Smoking status: Former  ? Smokeless tobacco: Never  ?Vaping Use  ? Vaping Use: Never used  ?Substance and Sexual Activity  ? Alcohol use: Not Currently  ?  Comment: occasional  ? Drug use: Never  ? Sexual activity: Not Currently  ? ? ?Allergies  ?Allergen  Reactions  ? Adhesive [Tape] Other (See Comments)  ?  blisters  ? Penicillins Diarrhea, Nausea And Vomiting and Rash  ?  ? ?Outpatient Medications Prior to Visit  ?Medication Sig Dispense Refill  ? ALPRAZolam (XANAX) 0.5 MG tablet     ? Calcium Carb-Cholecalciferol (CALCIUM 1000 + D PO) Take 1 tablet by mouth in the morning and at bedtime.    ? calcium citrate (CALCITRATE - DOSED IN MG ELEMENTAL CALCIUM) 950 (200 Ca) MG tablet Take 1 tablet by mouth daily.    ? Calcium-Phosphorus-Vitamin D (CITRACAL +D3) [phone] MG-MG-UNIT CHEW See admin instructions.    ? diclofenac Sodium (VOLTAREN) 1 % GEL Apply topically.    ? ezetimibe (ZETIA) 10 MG tablet Take by mouth.    ? famotidine (PEPCID) 20 MG tablet Take by mouth.    ? folic acid (FOLVITE) 1 MG tablet Take 1 mg by mouth daily. Take two tablets daily    ? hydrochlorothiazide (HYDRODIURIL) 25 MG tablet Take 25 mg by mouth daily.    ? Lactobacillus Rhamnosus, GG, (RA PROBIOTIC DIGESTIVE CARE) CAPS Take 1 capsule by mouth daily.    ? leucovorin (WELLCOVORIN) 5 MG tablet Take 5 mg by mouth 2 (two) times a week.    ? leucovorin (WELLCOVORIN) 5 MG tablet TAKE 2 TABLETS BY MOUTH ONCE A WEEK    ? levothyroxine (SYNTHROID) 50 MCG tablet Take 50 mcg by  mouth daily before breakfast.    ? methotrexate (RHEUMATREX) 2.5 MG tablet Take 2.5 mg by mouth 4 (four) times a week. Caution:Chemotherapy. Protect from light.    ? Multiple Vitamins-Minerals (PRESERVISION AREDS 2+MULTI VIT) CAPS See admin instructions.    ? Naproxen Sodium 220 MG CAPS Take by mouth.    ? nicotine polacrilex (COMMIT) 2 MG lozenge Place inside cheek.    ? potassium chloride (KLOR-CON) 20 MEQ packet Take 20 mEq by mouth 2 (two) times daily.    ? potassium chloride SA (KLOR-CON) 20 MEQ tablet Take by mouth.    ? primidone (MYSOLINE) 50 MG tablet Take 0.5 tablets (25 mg total) by mouth at bedtime. 45 tablet 2  ? propranolol (INDERAL) 10 MG tablet Take 1 tablet (10 mg total) by mouth 3 (three) times daily. 270  tablet 1  ? salsalate (DISALCID) 750 MG tablet Take 1,500 mg by mouth 2 (two) times daily. Takes 2 tablets TWICE daily.    ? simethicone (MYLICON) 80 MG chewable tablet Chew 80 mg by mouth as needed for flatulence.    ? timolol (BETIMOL) 0.25 % ophthalmic solution Place 1 drop into both eyes 2 (two) times daily.    ? ALPRAZolam (XANAX) 0.25 MG tablet Take 0.25 mg by mouth at bedtime as needed for anxiety. (Patient not taking: Reported on 03/01/2021)    ? levothyroxine (SYNTHROID) 50 MCG tablet 1 tablet in the morning on an empty stomach (Patient not taking: Reported on 03/01/2021)    ? salsalate (DISALCID) 750 MG tablet Take 2 tablets by mouth 2 (two) times daily. (Patient not taking: Reported on 03/01/2021)    ? triamterene-hydrochlorothiazide (MAXZIDE) 75-50 MG tablet Take 0.5 tablets by mouth daily. (Patient not taking: Reported on 06/08/2021)    ? ?No facility-administered medications prior to visit.  ? ? ?Review of Systems  ?Constitutional:  Negative for chills, diaphoresis, fever, malaise/fatigue and weight loss.  ?HENT:  Positive for congestion.   ?Respiratory:  Positive for cough. Negative for hemoptysis, sputum production, shortness of breath and wheezing.   ?Cardiovascular:  Negative for chest pain, palpitations and leg swelling.  ? ? ?Objective:  ? ?Vitals:  ? 08/26/21 1353  ?BP: (!) 146/72  ?Pulse: 74  ?Temp: 98.2 ?F (36.8 ?C)  ?TempSrc: Oral  ?SpO2: 97%  ?Weight: 150 lb 3.2 oz (68.1 kg)  ?Height: '5\' 2"'$  (1.575 m)  ? ?SpO2: 97 % ?O2 Device: None (Room air) ? ?Physical Exam: ?General: Well-appearing, no acute distress ?HENT: [place], AT ?Eyes: EOMI, no scleral icterus ?Respiratory: Clear to auscultation bilaterally.  No crackles, wheezing or rales ?Cardiovascular: RRR, -M/R/G, no JVD ?Extremities:-Edema,-tenderness ?Neuro: AAO x4, CNII-XII grossly intact ?Psych: Normal mood, normal affect ? ?Data Reviewed: ? ?Imaging: ?[facility] CTA 04/10/18 - No pulmonary embolism. No consolidation or pleural effusion. 3 mm nodule in the  RIGHT apex on axial image 17. Minimal peripheral reticulation suggesting chronic interstitial scar or fibrosis.  ? ?PFT: ?None on file ? ?Labs: ?CBC ?No results found for: WBC, RBC, HGB, HCT, PLT, MCV, MCH, MCHC, RDW, LYMPHSABS, MONOABS, EOSABS, BASOSABS ?OSH CBC 08/03/21 - WBC 4.5 Absolute eos 99 ? ?   ?Assessment & Plan:  ? ?Discussion: ?87 year old female with COPD, RA on methotrexate, HTN, hypothyroidism, B12 deficiency and essential tremor who presents for evaluation of COPD and cough. ?Common causes of cough were discussed including upper airway cough syndrome, reflux and undiagnosed obstructive lung disease. We reviewed evaluation and management for cough as noted below. ? ?Chronic cough ?--START Mucinex ONE  pill TWICE a day for seven days. This is over the counter ?--CONTINUE Flonase 1 spray per nare TWICE a day. This is over the counter ?--Allergies: START loratadine once a day. Buy over-the counter ?--Reflux: START omeprazole 40 mg once a day. Buy over-the-counter ? ?COPD ?--START Albuterol AS NEEDED for shortness of breath or wheezing ?--ARRANGE for pulmonary function tests ? ?Health Maintenance ? ?There is no immunization history on file for this patient. ?CT Lung Screen- Not qualified. Quit >15 years ? ?Orders Placed This Encounter  ?Procedures  ? Pulmonary Function Test  ?  Needs to be scheduled in may on the same day as her f/u  ?  Standing Status:   Future  ?  Standing Expiration Date:   08/27/2022  ?  Order Specific Question:   Where should this test be performed?  ?  Answer:   [facility]  ?  Order Specific Question:   Full PFT: includes the following: basic spirometry, spirometry pre & post bronchodilator, diffusion capacity (DLCO), lung volumes  ?  Answer:   Full PFT  ?No orders of the defined types were placed in this encounter. ? ? ?No follow-ups on file. After May ? ?I have spent a total time of 45-minutes on the day of the appointment reviewing prior documentation, coordinating care and  discussing medical diagnosis and plan with the patient/family. Imaging, labs and tests included in this note have been reviewed and interpreted independently by me. ? ?Miner Koral Rodman Pickle, MD ?[facility]

## 2021-08-26 NOTE — Patient Instructions (Addendum)
COPD ?--START Albuterol TWO puffs TWICE a day or AS NEEDED for shortness of breath or wheezing ?--ARRANGE for pulmonary function tests ? ?Chronic cough ?--START Mucinex ONE pill TWICE a day for seven days. This is over the counter ?--CONTINUE Flonase 1 spray per nare TWICE a day. This is over the counter ?--Allergies: START loratadine once a day. Buy over-the counter ?--Reflux: START omeprazole 40 mg once a day. Buy over-the-counter ? ?Follow-up with me in May with PFTs prior to visit. Ok to schedule PFT on a different day ?

## 2021-10-06 NOTE — Progress Notes (Signed)
?[facility] Note ? ?10/11/2021 ? ?  ? ?CHIEF COMPLAINT ?Patient presents for Retina Follow Up ? ?HISTORY OF PRESENT ILLNESS: ?Kayla Hurley is a 87 y.o. female who presents to the clinic today for:  ? ?HPI   ? ? 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  In right eye.  This started years ago.  Duration of 10 weeks.  I, the attending physician,  performed the HPI with the patient and updated documentation appropriately. ? ?  ?  ? ? Comments   ?Patient states that her eyes are so dry. She is using Systane PF OU PRN, refresh, and baby shampoo on the eye lids. She feels that at night when she gets up that she has to pry open her right eye.  ? ?  ?  ?Last edited by Bernarda Caffey, MD on 10/11/2021  1:15 PM.  ?  ? ? ? ?Referring physician: ?Buzzy Han, MD ?No address on file ? ?HISTORICAL INFORMATION:  ? ?Selected notes from the [place] ?Referred by Dr. Kathlen Mody for concern of exu ARMD OD  ? ?CURRENT MEDICATIONS: ?Current Outpatient Medications (Ophthalmic Drugs)  ?Medication Sig  ? timolol (BETIMOL) 0.25 % ophthalmic solution Place 1 drop into both eyes 2 (two) times daily.  ? ?No current facility-administered medications for this visit. (Ophthalmic Drugs)  ? ?Current Outpatient Medications (Other)  ?Medication Sig  ? ALPRAZolam (XANAX) 0.25 MG tablet Take 0.25 mg by mouth at bedtime as needed for anxiety.  ? ALPRAZolam (XANAX) 0.5 MG tablet   ? Calcium Carb-Cholecalciferol (CALCIUM 1000 + D PO) Take 1 tablet by mouth in the morning and at bedtime.  ? calcium citrate (CALCITRATE - DOSED IN MG ELEMENTAL CALCIUM) 950 (200 Ca) MG tablet Take 1 tablet by mouth daily.  ? Calcium-Phosphorus-Vitamin D (CITRACAL +D3) [phone] MG-MG-UNIT CHEW See admin instructions.  ? diclofenac Sodium (VOLTAREN) 1 % GEL Apply topically.  ? ezetimibe (ZETIA) 10 MG tablet Take by mouth.  ? famotidine (PEPCID) 20 MG tablet Take by mouth.  ? folic acid (FOLVITE) 1 MG  tablet Take 1 mg by mouth daily. Take two tablets daily  ? hydrochlorothiazide (HYDRODIURIL) 25 MG tablet Take 25 mg by mouth daily.  ? Lactobacillus Rhamnosus, GG, (RA PROBIOTIC DIGESTIVE CARE) CAPS Take 1 capsule by mouth daily.  ? leucovorin (WELLCOVORIN) 5 MG tablet Take 5 mg by mouth 2 (two) times a week.  ? leucovorin (WELLCOVORIN) 5 MG tablet TAKE 2 TABLETS BY MOUTH ONCE A WEEK  ? levothyroxine (SYNTHROID) 50 MCG tablet Take 50 mcg by mouth daily before breakfast.  ? levothyroxine (SYNTHROID) 50 MCG tablet   ? methotrexate (RHEUMATREX) 2.5 MG tablet Take 2.5 mg by mouth 4 (four) times a week. Caution:Chemotherapy. Protect from light.  ? Multiple Vitamins-Minerals (PRESERVISION AREDS 2+MULTI VIT) CAPS See admin instructions.  ? Naproxen Sodium 220 MG CAPS Take by mouth.  ? nicotine polacrilex (COMMIT) 2 MG lozenge Place inside cheek.  ? potassium chloride (KLOR-CON) 20 MEQ packet Take 20 mEq by mouth 2 (two) times daily.  ? potassium chloride SA (KLOR-CON) 20 MEQ tablet Take by mouth.  ? primidone (MYSOLINE) 50 MG tablet Take 0.5 tablets (25 mg total) by mouth at bedtime.  ? propranolol (INDERAL) 10 MG tablet Take 1 tablet (10 mg total) by mouth 3 (three) times daily.  ? salsalate (DISALCID) 750 MG tablet Take 1,500 mg by mouth 2 (two) times daily. Takes 2 tablets TWICE  daily.  ? salsalate (DISALCID) 750 MG tablet Take 2 tablets by mouth 2 (two) times daily.  ? simethicone (MYLICON) 80 MG chewable tablet Chew 80 mg by mouth as needed for flatulence.  ? triamterene-hydrochlorothiazide (MAXZIDE) 75-50 MG tablet Take 0.5 tablets by mouth daily.  ? ?No current facility-administered medications for this visit. (Other)  ? ?REVIEW OF SYSTEMS: ?ROS   ?Positive for: Neurological, Musculoskeletal, Eyes, Respiratory ?Negative for: Constitutional, Gastrointestinal, Skin, Genitourinary, HENT, Endocrine, Cardiovascular, Psychiatric, Allergic/Imm, Heme/Lymph ?Last edited by Annie Paras, COT on 10/11/2021 10:00 AM.  ?   ? ? ?ALLERGIES ?Allergies  ?Allergen Reactions  ? Adhesive [Tape] Other (See Comments)  ?  blisters  ? Penicillins Diarrhea, Nausea And Vomiting and Rash  ? ?PAST MEDICAL HISTORY ?Past Medical History:  ?Diagnosis Date  ? COPD (chronic obstructive pulmonary disease) ([place])   ? Glaucoma   ? Hypertensive retinopathy   ? Macular degeneration   ? Meningioma ([place])   ? Rheumatoid arthritis ([place])   ? ?Past Surgical History:  ?Procedure Laterality Date  ? APPENDECTOMY    ? CATARACT EXTRACTION    ? CESAREAN SECTION    ? x 3  ? CHOLECYSTECTOMY    ? CRANIECTOMY / CRANIOTOMY FOR EXCISION OF BRAIN TUMOR    ? meningioma  ? EYE SURGERY    ? HYSTEROSCOPY WITH D & C    ? INGUINAL HERNIA REPAIR    ? YAG LASER APPLICATION    ? ?FAMILY HISTORY ?Family History  ?Problem Relation Age of Onset  ? Other Mother   ?     died during open heart surgery  ? Heart disease Father   ? Diabetes Son   ? ? ?SOCIAL HISTORY ?Social History  ? ?Tobacco Use  ? Smoking status: Former  ? Smokeless tobacco: Never  ?Vaping Use  ? Vaping Use: Never used  ?Substance Use Topics  ? Alcohol use: Not Currently  ?  Comment: occasional  ? Drug use: Never  ?  ? ?  ?OPHTHALMIC EXAM: ? ?Base Eye Exam   ? ? Visual Acuity (Snellen - Linear)   ? ?   Right Left  ? Dist cc 20/20 +2 20/50  ? Dist ph cc  NI  ? ? Correction: Glasses  ? ?  ?  ? ? Tonometry (Tonopen, 10:09 AM)   ? ?   Right Left  ? Pressure 10 12  ? ?  ?  ? ? Pupils   ? ?   Dark Light Shape React APD  ? Right 2 1 Round Brisk None  ? Left 2 1 Round Brisk None  ? ?  ?  ? ? Visual Fields   ? ?   Left Right  ?  Full Full  ? ?  ?  ? ? Extraocular Movement   ? ?   Right Left  ?  Full Full  ? ?  ?  ? ? Neuro/Psych   ? ? Oriented x3: Yes  ? Mood/Affect: Normal  ? ?  ?  ? ? Dilation   ? ? Both eyes: 1.0% Mydriacyl, 2.5% Phenylephrine @ 10:03 AM  ? ?  ?  ? ?  ? ?Slit Lamp and Fundus Exam   ? ? Slit Lamp Exam   ? ?   Right Left  ? Lids/Lashes Dermato Dermato, mild MGD  ? Conjunctiva/Sclera White and quiet White and quiet  ?  Cornea Mild arcus, trace PEE, mild tear film debris 1+ PEE, mild tear film debris, arcus  ?  Anterior Chamber Deep and quiet Deep and quiet  ? Iris Round and dilated Round and dilated  ? Lens PCIOL in excellent position, open pc PCIOL in excellent position  ? Anterior Vitreous syneresis syneresis  ? ?  ?  ? ? Fundus Exam   ? ?   Right Left  ? Disc Pink, sharp, +cupping and central pallor, mild temporal PPA Pink and sharp, +cupping  ? C/D Ratio 0.65 0.6  ? Macula Blunted foveal reflex, drusen, RPE mottling, clumping and atrophy, +CNV with stable improvement in shallow SRF/edema, no frank heme Flat, blunted foveal reflex, Drusen, RPE mottling, clumping and atrophy, small patch of GA superior to fovea, No heme or edema  ? Vessels attenuated, Tortuous attenuated, Tortuous  ? Periphery Attached, mild peripheral drusen, mild reticular degeneration, focal paving stone inferiorly, no heme Attached, mild peripheral drusen and mild reticular degeneration, paving stone inferiorly  ? ?  ?  ? ?  ? ?Refraction   ? ? Wearing Rx   ? ?   Sphere Cylinder Axis Add  ? Right -0.50 +1.75 010 +2.50  ? Left -2.50 +2.00 175 +2.50  ? ?  ?  ? ?  ? ?IMAGING AND PROCEDURES  ?Imaging and Procedures for 10/11/2021 ? ?OCT, Retina - OU - Both Eyes   ? ?   ?Right Eye ?Quality was good. Central Foveal Thickness: 279. Progression has been stable. Findings include no IRF, outer retinal atrophy, retinal drusen , no SRF, pigment epithelial detachment, normal foveal contour (Stable improvement in [facility] and edema overlying PED IT fovea).  ? ?Left Eye ?Quality was good. Central Foveal Thickness: 259. Progression has been stable. Findings include normal foveal contour, no IRF, no SRF, retinal drusen , outer retinal atrophy.  ? ?Notes ?*Images captured and stored on drive ? ?Diagnosis / Impression:  ?OD: exudative ARMD -- Stable improvement in [facility] and edema overlying PED IT fovea ?OS: nonexudative ARMD;  NFP, no IRF/SRF ? ?Clinical management:  ?See  below ? ?Abbreviations: NFP - Normal foveal profile. CME - cystoid macular edema. PED - pigment epithelial detachment. IRF - intraretinal fluid. SRF - subretinal fluid. EZ - ellipsoid zone. ERM - epiretinal Gardiner Fanti

## 2021-10-11 ENCOUNTER — Ambulatory Visit (INDEPENDENT_AMBULATORY_CARE_PROVIDER_SITE_OTHER): Payer: Medicare Other | Admitting: Ophthalmology

## 2021-10-11 ENCOUNTER — Encounter (INDEPENDENT_AMBULATORY_CARE_PROVIDER_SITE_OTHER): Payer: Self-pay | Admitting: Ophthalmology

## 2021-10-11 DIAGNOSIS — H353122 Nonexudative age-related macular degeneration, left eye, intermediate dry stage: Secondary | ICD-10-CM | POA: Diagnosis not present

## 2021-10-11 DIAGNOSIS — Z961 Presence of intraocular lens: Secondary | ICD-10-CM

## 2021-10-11 DIAGNOSIS — I10 Essential (primary) hypertension: Secondary | ICD-10-CM

## 2021-10-11 DIAGNOSIS — H401132 Primary open-angle glaucoma, bilateral, moderate stage: Secondary | ICD-10-CM

## 2021-10-11 DIAGNOSIS — H353211 Exudative age-related macular degeneration, right eye, with active choroidal neovascularization: Secondary | ICD-10-CM | POA: Diagnosis not present

## 2021-10-11 DIAGNOSIS — H35033 Hypertensive retinopathy, bilateral: Secondary | ICD-10-CM | POA: Diagnosis not present

## 2021-10-11 DIAGNOSIS — H04123 Dry eye syndrome of bilateral lacrimal glands: Secondary | ICD-10-CM

## 2021-10-11 DIAGNOSIS — H53002 Unspecified amblyopia, left eye: Secondary | ICD-10-CM

## 2021-10-11 MED ORDER — BEVACIZUMAB CHEMO INJECTION 1.25MG/0.05ML SYRINGE FOR KALEIDOSCOPE
1.2500 mg | INTRAVITREAL | Status: AC | PRN
  Administered 2021-10-11: 1.25 mg via INTRAVITREAL

## 2021-10-26 ENCOUNTER — Telehealth: Payer: Self-pay | Admitting: Neurology

## 2021-10-26 ENCOUNTER — Other Ambulatory Visit: Payer: Self-pay | Admitting: Neurology

## 2021-10-26 NOTE — Telephone Encounter (Signed)
Patient called and stated she is taking the primidone 1 tablet per night.  She stated her left hand is really shaky and she cant do a lot with it.  She stated she needs a refill.  She uses Paediatric nurse on [address].

## 2021-10-27 NOTE — Telephone Encounter (Signed)
Per Dr. Carles Collet ok to change primidone to 1 tablet at bedtime as patient has been taking. Updated  Rx sent to pharmacy. Patient is aware.

## 2021-10-27 NOTE — Telephone Encounter (Signed)
Called patient and she stated that she has been taking 1 tablet of primidone for the past month. I asked patient how has she been doing on it because previous a 1/2 of tablet "knocked her out" patient stated that she titrated herself up alittle at a time and has been feeling good on the 1 tablet of primidone. I informed patient to let Dr. Carles Collet know in advance before making changes to her medication. Patient verbalized understanding and is aware we are sending in her prescription.

## 2021-11-03 ENCOUNTER — Ambulatory Visit (INDEPENDENT_AMBULATORY_CARE_PROVIDER_SITE_OTHER): Payer: Medicare Other | Admitting: Pulmonary Disease

## 2021-11-03 ENCOUNTER — Encounter: Payer: Self-pay | Admitting: Pulmonary Disease

## 2021-11-03 VITALS — BP 130/60 | HR 72 | Temp 97.5°F | Ht 62.5 in | Wt 149.6 lb

## 2021-11-03 DIAGNOSIS — J449 Chronic obstructive pulmonary disease, unspecified: Secondary | ICD-10-CM

## 2021-11-03 LAB — PULMONARY FUNCTION TEST
DL/VA % pred: 95 %
DL/VA: 3.92 ml/min/mmHg/L
DLCO cor % pred: 85 %
DLCO cor: 15.02 ml/min/mmHg
DLCO unc % pred: 85 %
DLCO unc: 15.02 ml/min/mmHg
FEF 25-75 Post: 1.31 L/sec
FEF 25-75 Pre: 0.93 L/sec
FEF2575-%Change-Post: 40 %
FEF2575-%Pred-Post: 117 %
FEF2575-%Pred-Pre: 83 %
FEV1-%Change-Post: 17 %
FEV1-%Pred-Post: 89 %
FEV1-%Pred-Pre: 76 %
FEV1-Post: 1.48 L
FEV1-Pre: 1.26 L
FEV1FVC-%Change-Post: 7 %
FEV1FVC-%Pred-Pre: 93 %
FEV6-%Change-Post: 8 %
FEV6-%Pred-Post: 95 %
FEV6-%Pred-Pre: 88 %
FEV6-Post: 2.03 L
FEV6-Pre: 1.86 L
FEV6FVC-%Pred-Post: 106 %
FEV6FVC-%Pred-Pre: 106 %
FVC-%Change-Post: 8 %
FVC-%Pred-Post: 89 %
FVC-%Pred-Pre: 82 %
FVC-Post: 2.03 L
FVC-Pre: 1.86 L
Post FEV1/FVC ratio: 73 %
Post FEV6/FVC ratio: 100 %
Pre FEV1/FVC ratio: 68 %
Pre FEV6/FVC Ratio: 100 %

## 2021-11-03 NOTE — Progress Notes (Signed)
Subjective:   PATIENT ID: Kayla Hurley GENDER: female DOB: 08-24-1934, MRN: [id number]   HPI  Chief Complaint  Patient presents with   Follow-up    Patient did PFT today.     Reason for Visit: Follow-up  Kayla Hurley is a 87 year old female with COPD, RA on methotrexate, osteoporosis, HTN, hypothyroidism, B12 deficiency and essential tremor who presents as a new consult.  Faxed records from [facility] reviewed. She was referred to Pulmonary for PFTs and albuterol trial. Has had a cough thought to be related to COPD vs GERD. She was advised to trial omeprazole as well.   She was diagnosed with COPD three years ago. Symptoms occur at night including a dry cough with chest congestion. Denies wheezing. Limited activity due to arthritis but able to participate in physical therapy twice a week 45-60 minutes. Does not take mucinex. She reports nasal congestion with post nasal drip  10/16/21 She reports cough has improved on DayQuil and Flonase. Cough is a dry cough. No limitation in activity. During PFTs, she did not like using albuterol due to the tremors.   Social History: Former smoker. Starting at 87 years old. Unsure how much she smoked. Quit 30 years ago. 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  Past Medical History:  Diagnosis Date   COPD (chronic obstructive pulmonary disease) (H[facility])    Glaucoma    Hypertensive retinopathy    Macular degeneration    Meningioma ([facility])    Rheumatoid arthritis ([place])      Family History  Problem Relation Age of Onset   Other Mother        died during open heart surgery   Heart disease Father    Diabetes Son      Social History   Occupational History   Not on file  Tobacco Use   Smoking status: Former   Smokeless tobacco: Never  Scientific laboratory technician Use: Never used  Substance and Sexual Activity   Alcohol use: Not Currently    Comment: occasional   Drug use: Never   Sexual  activity: Not Currently    Allergies  Allergen Reactions   Adhesive [Tape] Other (See Comments)    blisters   Penicillins Diarrhea, Nausea And Vomiting and Rash     Outpatient Medications Prior to Visit  Medication Sig Dispense Refill   Calcium-Phosphorus-Vitamin D (CITRACAL +D3) [phone] MG-MG-UNIT CHEW See admin instructions.     diclofenac Sodium (VOLTAREN) 1 % GEL Apply topically.     folic acid (FOLVITE) 1 MG tablet Take 1 mg by mouth daily. Take two tablets daily     hydrochlorothiazide (HYDRODIURIL) 25 MG tablet Take 25 mg by mouth daily.     Lactobacillus Rhamnosus, GG, (RA PROBIOTIC DIGESTIVE CARE) CAPS Take 1 capsule by mouth daily.     leucovorin (WELLCOVORIN) 5 MG tablet TAKE 2 TABLETS BY MOUTH ONCE A WEEK     levothyroxine (SYNTHROID) 50 MCG tablet Take 50 mcg by mouth daily before breakfast.     methotrexate (RHEUMATREX) 2.5 MG tablet Take 10 mg by mouth once a week. Caution:Chemotherapy. Protect from light.     Multiple Vitamins-Minerals (PRESERVISION AREDS 2+MULTI VIT) CAPS See admin instructions.     Naproxen Sodium 220 MG CAPS Take by mouth.     nicotine polacrilex (COMMIT) 2 MG lozenge Place inside cheek.     potassium chloride (KLOR-CON) 20 MEQ packet Take 20 mEq by mouth 2 (two) times daily.  primidone (MYSOLINE) 50 MG tablet Take one tablet by mouth at bedtime 90 tablet 0   salsalate (DISALCID) 750 MG tablet Take 2 tablets by mouth 2 (two) times daily.     timolol (BETIMOL) 0.25 % ophthalmic solution Place 1 drop into both eyes 2 (two) times daily.     ALPRAZolam (XANAX) 0.25 MG tablet Take 0.25 mg by mouth at bedtime as needed for anxiety. (Patient not taking: Reported on 11/03/2021)     ezetimibe (ZETIA) 10 MG tablet Take by mouth. (Patient not taking: Reported on 11/03/2021)     propranolol (INDERAL) 10 MG tablet Take 1 tablet (10 mg total) by mouth 3 (three) times daily. (Patient not taking: Reported on 11/03/2021) 270 tablet 1   ALPRAZolam (XANAX) 0.5 MG  tablet  (Patient not taking: Reported on 11/03/2021)     Calcium Carb-Cholecalciferol (CALCIUM 1000 + D PO) Take 1 tablet by mouth in the morning and at bedtime.     calcium citrate (CALCITRATE - DOSED IN MG ELEMENTAL CALCIUM) 950 (200 Ca) MG tablet Take 1 tablet by mouth daily.     famotidine (PEPCID) 20 MG tablet Take by mouth.     leucovorin (WELLCOVORIN) 5 MG tablet Take 5 mg by mouth 2 (two) times a week.     levothyroxine (SYNTHROID) 50 MCG tablet      potassium chloride SA (KLOR-CON) 20 MEQ tablet Take by mouth.     salsalate (DISALCID) 750 MG tablet Take 1,500 mg by mouth 2 (two) times daily. Takes 2 tablets TWICE daily.     simethicone (MYLICON) 80 MG chewable tablet Chew 80 mg by mouth as needed for flatulence.     triamterene-hydrochlorothiazide (MAXZIDE) 75-50 MG tablet Take 0.5 tablets by mouth daily.     No facility-administered medications prior to visit.    Review of Systems  Constitutional:  Negative for chills, diaphoresis, fever, malaise/fatigue and weight loss.  HENT:  Negative for congestion.   Respiratory:  Positive for cough. Negative for hemoptysis, sputum production, shortness of breath and wheezing.   Cardiovascular:  Negative for chest pain, palpitations and leg swelling.    Objective:   Vitals:   11/03/21 1345  BP: 130/60  Pulse: 72  Temp: (!) 97.5 F (36.4 C)  TempSrc: Oral  SpO2: 98%  Weight: 149 lb 9.6 oz (67.9 kg)  Height: 5' 2.5" (1.588 m)   SpO2: 98 % O2 Device: None (Room air)  Physical Exam: General: Well-appearing, no acute distress HENT: [place], AT Eyes: EOMI, no scleral icterus Respiratory: Clear to auscultation bilaterally.  No crackles, wheezing or rales Cardiovascular: RRR, -M/R/G, no JVD Extremities:-Edema,-tenderness Neuro: AAO x4, CNII-XII grossly intact Psych: Normal mood, normal affect   Data Reviewed:  Imaging: OSH CTA 04/10/18 - No pulmonary embolism. No consolidation or pleural effusion. 3 mm nodule in the RIGHT apex on  axial image 17. Minimal peripheral reticulation suggesting chronic interstitial scar or fibrosis.   PFT: 11/03/21 FVC 2.03 (89%) FEV1 1.48 (73%) Ratio 68  DLCO 85% Interpretation: No evidence of obstruction on spirometry however significant bronchodilator response in FEV1 consistent with asthma. Normal DLCO   Labs: CBC No results found for: WBC, RBC, HGB, HCT, PLT, MCV, MCH, MCHC, RDW, LYMPHSABS, MONOABS, EOSABS, BASOSABS OSH CBC 08/03/21 - WBC 4.5 Absolute eos 99     Assessment & Plan:   Discussion: 87 year old female with COPD-asthma overlap, RA on methotrexate, HTN, hypothyroidism, B12 deficiency and essential tremor who presents for follow-up. We reviewed PFTs which were consistent with asthma.  Her symptoms are improved other the counter meds. Did not tolerate albuterol. Counseled on clinical course of asthma and if her symptoms worsen she would likely benefit from maintenance therapy.  Chronic cough - improved --CONTINUE DayQuil as needed --CONTINUE Flonase 1 spray per nare TWICE a day. This is over the counter  COPD/asthma overlap --STOP albuterol due to tremors --If you wish to be on an inhaler in the future, recommend starting low dose ICS/LABA in the future. This can be prescribed by your primary physician  Health Maintenance  There is no immunization history on file for this patient. CT Lung Screen- Not qualified. Quit >15 years  No orders of the defined types were placed in this encounter. No orders of the defined types were placed in this encounter.   Return if symptoms worsen or fail to improve.   I have spent a total time of 35-minutes on the day of the appointment including chart review, data review, collecting history, coordinating care and discussing medical diagnosis and plan with the patient/family. Past medical history, allergies, medications were reviewed. Pertinent imaging, labs and tests included in this note have been reviewed and interpreted independently  by me.  [place], MD [facility] 11/03/2021 1:58 PM  Office Number [phone]

## 2021-11-03 NOTE — Progress Notes (Signed)
Attempted Full PFT Today. Performed Pre/Post Spirometry and DLCO.  

## 2021-11-03 NOTE — Patient Instructions (Signed)
Attempted Full PFT Today. Performed Pre/Post Spirometry and DLCO.  

## 2021-11-03 NOTE — Patient Instructions (Signed)
Chronic cough - improved --CONTINUE DayQuil as needed --CONTINUE Flonase 1 spray per nare TWICE a day. This is over the counter  COPD/asthma overlap --STOP albuterol due to tremors --If you wish to be on an inhaler in the future, recommend starting low dose ICS/LABA in the future. This can be prescribed by your primary physician  Follow-up with me as needed

## 2021-11-09 ENCOUNTER — Encounter: Payer: Self-pay | Admitting: Pulmonary Disease

## 2021-11-16 NOTE — Progress Notes (Unsigned)
Assessment/Plan:    1.  Essential Tremor  -Patient will continue on primidone, 50 mg, 1 tablet at bedtime.  It has taken her years to be able to work up to this tiny dose.  She is still complaining about significant tremor, but she worries about going up further on the dose.  She states that she already is getting hangover effect, so she does not want to take more right at bedtime.  Ultimately, she decided that she would try to take a half a tablet around 3 PM and then another full tablet at bedtime and let me know if she is able to tolerate this.  This likely will be enough, but this was the only thing that she thought she would be able to tolerate. -Patient stopped her propranolol,10 mg three times per day.  She did not tolerate extremely low-dose topiramate, although I am not really sure that the side effects she reported (GI upset with 25 mg) was really related to the topiramate, as she only took it for 2 days `-she isn't interested in focused ultrasound or DBS surgery due to age -she refuses gabapentin - "I won't take that medication.  I would rather take xanax"  -she doesn't want to explore cala trio due to cost   -she has developed a bit of rest tremor on the L.  She has no other parkinsonian features.  Decided to go ahead and do a DaTscan.  Subjective:   Kayla Hurley was seen today in follow up for essential tremor.  My previous records were reviewed prior to todays visit.  When patient was last seen, she was on a very low-dose of primidone, only one quarter of a tablet at bedtime.  When I last got a refill request on her, she had reported that she had moved it up to 1 tablet at bedtime.  She previously reported that she could not tolerate that, but states today that she is doing well on that. "I don't have to clean up as many messes."  She does state though that her quality of life is low due to tremor.  She would like to know what her other options are.  She is very frustrated.  She went  off of the propranolol, stating her BP was going too low.    Current prescribed movement disorder medications: Primidone, 50 mg, 1/4 tablet at bedtime Propranolol, 10 mg 1 tablet tid (finds that she gets low BP if she does it differently)   Previously tried tremor medications:  propranolol, 10 mg 3 times per day (BP low); xanax helped it; Topamax, 25 mg twice daily (patient took it for 2 days and felt that it upset her stomach and stopped it)    ALLERGIES:   Allergies  Allergen Reactions   Adhesive [Tape] Other (See Comments)    blisters   Penicillins Diarrhea, Nausea And Vomiting and Rash    CURRENT MEDICATIONS:  Outpatient Encounter Medications as of 11/17/2021  Medication Sig   Calcium-Phosphorus-Vitamin D (CITRACAL +D3) [phone] MG-MG-UNIT CHEW See admin instructions.   diclofenac Sodium (VOLTAREN) 1 % GEL Apply topically.   folic acid (FOLVITE) 1 MG tablet Take 1 mg by mouth daily. Take two tablets daily   hydrochlorothiazide (HYDRODIURIL) 25 MG tablet Take 25 mg by mouth daily.   leucovorin (WELLCOVORIN) 5 MG tablet TAKE 2 TABLETS BY MOUTH ONCE A WEEK   levothyroxine (SYNTHROID) 50 MCG tablet Take 50 mcg by mouth daily before breakfast.   methotrexate (RHEUMATREX) 2.5 MG  tablet Take 10 mg by mouth once a week. Caution:Chemotherapy. Protect from light.   Multiple Vitamins-Minerals (PRESERVISION AREDS 2+MULTI VIT) CAPS See admin instructions.   nicotine polacrilex (COMMIT) 2 MG lozenge Place inside cheek.   potassium chloride (KLOR-CON) 20 MEQ packet Take 20 mEq by mouth 2 (two) times daily.   primidone (MYSOLINE) 50 MG tablet Take one tablet by mouth at bedtime   Propylene Glycol (SYSTANE BALANCE OP) Apply to eye.   salsalate (DISALCID) 750 MG tablet Take 2 tablets by mouth 2 (two) times daily.   timolol (BETIMOL) 0.25 % ophthalmic solution Place 1 drop into both eyes 2 (two) times daily.   ALPRAZolam (XANAX) 0.25 MG tablet Take 0.25 mg by mouth at bedtime as needed for  anxiety. (Patient not taking: Reported on 11/03/2021)   propranolol (INDERAL) 10 MG tablet Take 1 tablet (10 mg total) by mouth 3 (three) times daily. (Patient not taking: Reported on 11/03/2021)   [DISCONTINUED] ezetimibe (ZETIA) 10 MG tablet Take by mouth. (Patient not taking: Reported on 11/03/2021)   [DISCONTINUED] Lactobacillus Rhamnosus, GG, (RA PROBIOTIC DIGESTIVE CARE) CAPS Take 1 capsule by mouth daily.   [DISCONTINUED] Naproxen Sodium 220 MG CAPS Take by mouth.   No facility-administered encounter medications on file as of 11/17/2021.     Objective:    PHYSICAL EXAMINATION:    VITALS:   Vitals:   11/17/21 1105  Weight: 149 lb 3.2 oz (67.7 kg)  Height: 5' 2.5" (1.588 m)     GEN:  The patient appears stated age and is in NAD. HEENT:  Normocephalic, atraumatic.  The mucous membranes are moist. The superficial temporal arteries are without ropiness or tenderness. CV:  RRR Lungs:  CTAB Neck/HEME:  There are no carotid bruits bilaterally.  Neurological examination:  Orientation: The patient is alert and oriented x3. Cranial nerves: There is good facial symmetry. The speech is fluent and clear. Soft palate rises symmetrically and there is no tongue deviation. Hearing is intact to conversational tone. Sensation: Sensation is intact to light touch throughout Motor: Strength is at least antigravity x4.  Movement examination: Tone: There is normal tone in the UE/LE Abnormal movements: there is LUE rest tremor intermittently and with ambulation.  No postural tremor.  Has significant archimedes spirals trouble on the L but the R is good Coordination:  There is no decremation with RAM's, with any form of RAMS, including alternating supination and pronation of the forearm, hand opening and closing, finger taps, heel taps and toe taps. Gait and Station: The patient has no difficulty arising out of a deep-seated chair without the use of the hands. The patient ambulates well but has LUE  tremor    Total time spent on today's visit was 25 minutes, including both face-to-face time and nonface-to-face time.  Time included that spent on review of records (prior notes available to me/labs/imaging if pertinent), discussing treatment and goals, answering patient's questions and coordinating care.  Cc:  Margretta Sidle, MD

## 2021-11-17 ENCOUNTER — Encounter: Payer: Self-pay | Admitting: Neurology

## 2021-11-17 ENCOUNTER — Ambulatory Visit (INDEPENDENT_AMBULATORY_CARE_PROVIDER_SITE_OTHER): Payer: Medicare Other | Admitting: Neurology

## 2021-11-17 VITALS — BP 150/70 | HR 65 | Ht 62.5 in | Wt 149.2 lb

## 2021-11-17 DIAGNOSIS — R251 Tremor, unspecified: Secondary | ICD-10-CM | POA: Diagnosis not present

## 2021-11-17 NOTE — Patient Instructions (Signed)
As we discussed, we are going to do a DaT scan.  We discussed that this is not a diagnostic scan, but will just give us some information on dopamine levels in the brain.  Here is some information which may be helpful to you. ? ?Before the Exam ? ?Please tell the nurse, nuclear imaging technician or nuclear medicine physician if you are pregnant, nursing or have reduced liver function. ?Please also inform us if you have an allergy or sensitivity to iodine.  The test may be completed with those who are allergic to iodine, but may require pre-medication with other medications to help avoid reactions. ?If you need to cancel the examination, please give us at least 24 hours notice. ? ?On the Day of the Exam ?Drink plenty of fluids and go to the bathroom frequently (and for two days after your exam) ?Wear loose comfortable clothing, since you will need to lie still for a period of time. ?Please bring a list of all medications that you are taking; name and dosage. ?We want to make your waiting time as pleasant as possible. Consider bringing your favorite magazine, book or music player to help you pass the time.  You do not need to stay at the imaging facility the entire time, between the initial injection and the scan itself.   ?Please leave your jewelry and valuables at home. ? ?During the Exam ?The DaTscan once started takes approximately 30-45 minutes. However, following injection of the DaT agent approximately 3-6 hours are required before the agent has achieved appropriate concentration in the brain.  We will inject the DaTscan through an intravenous (IV) line into your arm in the AM, usually around 8-9am, and then you will come back usually in the mid afternoon for the scan. ?Before the exam, you will receive a drug to allow you to protect the thyroid. ?For the imaging test, you will be asked to lie on a table and an imaging technologist will position your head in a headrest. A strip of tape or a flexible restraint  may be placed around your head to help you to not move your head during the scan. ?A camera will be positioned above you and you must remain very still for about 30 minute while images are taken. The scanner will be very close to your head, but will not touch your head. ? ?

## 2021-12-23 DIAGNOSIS — R4189 Other symptoms and signs involving cognitive functions and awareness: Secondary | ICD-10-CM | POA: Insufficient documentation

## 2021-12-23 DIAGNOSIS — L219 Seborrheic dermatitis, unspecified: Secondary | ICD-10-CM | POA: Insufficient documentation

## 2021-12-23 DIAGNOSIS — N3941 Urge incontinence: Secondary | ICD-10-CM | POA: Insufficient documentation

## 2021-12-30 NOTE — Progress Notes (Signed)
[facility] Note  01/03/2022     CHIEF COMPLAINT Patient presents for Retina Follow Up  HISTORY OF PRESENT ILLNESS: Kayla Hurley is a 87 y.o. female who presents to the clinic today for:   HPI     Retina Follow Up   Patient presents with  Wet AMD (IVA OD 05.08.23).  In right eye.  This started years ago.  Duration of 12 weeks.  I, the attending physician,  performed the HPI with the patient and updated documentation appropriately.        Comments   Patient feels that the vision is the same. She is complaining of dry eyes, crusted lids, and cant keep enough moisture in the eyes.       Last edited by Bernarda Caffey, MD on 01/03/2022 11:48 AM.    Pt states eyes are crusted together in the morning when she wakes up, eyes are dry, she uses Systane PF drops  Referring physician: No referring provider defined for this encounter.  HISTORICAL INFORMATION:   Selected notes from the MEDICAL RECORD NUMBER Referred by Dr. Kathlen Mody for concern of exu ARMD OD   CURRENT MEDICATIONS: Current Outpatient Medications (Ophthalmic Drugs)  Medication Sig   Propylene Glycol (SYSTANE BALANCE OP) Apply to eye.   timolol (BETIMOL) 0.25 % ophthalmic solution Place 1 drop into both eyes 2 (two) times daily.   No current facility-administered medications for this visit. (Ophthalmic Drugs)   Current Outpatient Medications (Other)  Medication Sig   ALPRAZolam (XANAX) 0.25 MG tablet Take 0.25 mg by mouth at bedtime as needed for anxiety. (Patient not taking: Reported on 11/03/2021)   Calcium-Phosphorus-Vitamin D (CITRACAL +D3) [phone] MG-MG-UNIT CHEW See admin instructions.   diclofenac Sodium (VOLTAREN) 1 % GEL Apply topically.   folic acid (FOLVITE) 1 MG tablet Take 1 mg by mouth daily. Take two tablets daily   hydrochlorothiazide (HYDRODIURIL) 25 MG tablet Take 25 mg by mouth daily.   leucovorin (WELLCOVORIN) 5 MG tablet TAKE 2 TABLETS BY MOUTH ONCE A WEEK    levothyroxine (SYNTHROID) 50 MCG tablet Take 50 mcg by mouth daily before breakfast.   methotrexate (RHEUMATREX) 2.5 MG tablet Take 10 mg by mouth once a week. Caution:Chemotherapy. Protect from light.   Multiple Vitamins-Minerals (PRESERVISION AREDS 2+MULTI VIT) CAPS See admin instructions.   nicotine polacrilex (COMMIT) 2 MG lozenge Place inside cheek.   potassium chloride (KLOR-CON) 20 MEQ packet Take 20 mEq by mouth 2 (two) times daily.   primidone (MYSOLINE) 50 MG tablet Take one tablet by mouth at bedtime   propranolol (INDERAL) 10 MG tablet Take 1 tablet (10 mg total) by mouth 3 (three) times daily. (Patient not taking: Reported on 11/03/2021)   salsalate (DISALCID) 750 MG tablet Take 2 tablets by mouth 2 (two) times daily.   No current facility-administered medications for this visit. (Other)   REVIEW OF SYSTEMS: ROS   Positive for: Neurological, Musculoskeletal, Eyes, Respiratory Negative for: Constitutional, Gastrointestinal, Skin, Genitourinary, HENT, Endocrine, Cardiovascular, Psychiatric, Allergic/Imm, Heme/Lymph Last edited by Annie Paras, COT on 01/03/2022  9:42 AM.     ALLERGIES Allergies  Allergen Reactions   Adhesive [Tape] Other (See Comments)    blisters   Penicillins Diarrhea, Nausea And Vomiting and Rash   PAST MEDICAL HISTORY Past Medical History:  Diagnosis Date   COPD (chronic obstructive pulmonary disease) (HCC)    Glaucoma    Hypertensive retinopathy    Macular degeneration    Meningioma ([place])    Rheumatoid  arthritis ([place])    Past Surgical History:  Procedure Laterality Date   APPENDECTOMY     CATARACT EXTRACTION     CESAREAN SECTION     x 3   CHOLECYSTECTOMY     CRANIECTOMY / CRANIOTOMY FOR EXCISION OF BRAIN TUMOR     meningioma   EYE SURGERY     HYSTEROSCOPY WITH D & C     INGUINAL HERNIA REPAIR     YAG LASER APPLICATION     FAMILY HISTORY Family History  Problem Relation Age of Onset   Other Mother        died during open  heart surgery   Heart disease Father    Diabetes Son    SOCIAL HISTORY Social History   Tobacco Use   Smoking status: Former   Smokeless tobacco: Never  Scientific laboratory technician Use: Never used  Substance Use Topics   Alcohol use: Not Currently    Comment: occasional   Drug use: Never       OPHTHALMIC EXAM:  Base Eye Exam     Visual Acuity (Snellen - Linear)       Right Left   Dist cc 20/20 20/60   Dist ph cc  NI         Tonometry (Tonopen, 9:46 AM)       Right Left   Pressure 18 18         Pupils       Dark Light Shape React APD   Right 2 1 Round Brisk None   Left 2 1 Round Brisk None         Visual Fields       Left Right    Full Full         Extraocular Movement       Right Left    Full, Ortho Full, Ortho         Neuro/Psych     Oriented x3: Yes   Mood/Affect: Normal         Dilation     Both eyes: 1.0% Mydriacyl, 2.5% Phenylephrine @ 9:43 AM           Slit Lamp and Fundus Exam     Slit Lamp Exam       Right Left   Lids/Lashes Dermato Dermato, mild MGD   Conjunctiva/Sclera White and quiet White and quiet   Cornea Mild arcus, trace PEE, mild tear film debris 1+ PEE, mild tear film debris, arcus   Anterior Chamber Deep and quiet Deep and quiet   Iris Round and dilated Round and dilated   Lens PCIOL in excellent position, open pc PCIOL in excellent position   Anterior Vitreous syneresis syneresis         Fundus Exam       Right Left   Disc Pink, sharp, +cupping and central pallor, mild temporal PPA Pink and sharp, +cupping   C/D Ratio 0.65 0.6   Macula Blunted foveal reflex, drusen, RPE mottling, clumping and atrophy, +CNV with stable improvement in shallow SRF/edema, no frank heme Flat, blunted foveal reflex, Drusen, RPE mottling, clumping and atrophy, small patch of GA superior to fovea, No heme or edema   Vessels attenuated, Tortuous attenuated, Tortuous   Periphery Attached, mild peripheral drusen, mild reticular  degeneration, focal paving stone inferiorly, no heme Attached, mild peripheral drusen and mild reticular degeneration, paving stone inferiorly           Refraction     Wearing  Rx       Sphere Cylinder Axis Add   Right -0.50 +1.75 010 +2.50   Left -2.50 +2.00 175 +2.50           IMAGING AND PROCEDURES  Imaging and Procedures for 01/03/2022  OCT, Retina - OU - Both Eyes       Right Eye Quality was good. Central Foveal Thickness: 286. Progression has been stable. Findings include normal foveal contour, no IRF, no SRF, retinal drusen , pigment epithelial detachment, outer retinal atrophy (Stable improvement in [facility] and edema overlying PED IT fovea).   Left Eye Quality was good. Central Foveal Thickness: 256. Progression has been stable. Findings include normal foveal contour, no IRF, no SRF, retinal drusen , outer retinal atrophy.   Notes *Images captured and stored on drive  Diagnosis / Impression:  OD: exudative ARMD -- Stable improvement in [facility] and edema overlying PED IT fovea OS: nonexudative ARMD;  NFP, no IRF/SRF  Clinical management:  See below  Abbreviations: NFP - Normal foveal profile. CME - cystoid macular edema. PED - pigment epithelial detachment. IRF - intraretinal fluid. SRF - subretinal fluid. EZ - ellipsoid zone. ERM - epiretinal membrane. ORA - outer retinal atrophy. ORT - outer retinal tubulation. SRHM - subretinal hyper-reflective material. IRHM - intraretinal hyper-reflective material      Intravitreal Injection, Pharmacologic Agent - OD - Right Eye       Time Out 01/03/2022. 10:25 AM. Confirmed correct patient, procedure, site, and patient consented.   Anesthesia Topical anesthesia was used. Anesthetic medications included Lidocaine 2%, Proparacaine 0.5%.   Procedure Preparation included 5% betadine to ocular surface, eyelid speculum. A (32 g) needle was used.   Injection: 1.25 mg Bevacizumab 1.'25mg'$ /0.53m   Route: Intravitreal, Site:  Right Eye   NDC: [id number] Lot:: [id number] Expiration date: 04/08/2022   Post-op Post injection exam found visual acuity of at least counting fingers. The patient tolerated the procedure well. There were no complications. The patient received written and verbal post procedure care education. Post injection medications were not given.            ASSESSMENT/PLAN:    ICD-10-CM   1. Exudative age-related macular degeneration of right eye with active choroidal neovascularization (HCC)  H35.3211 OCT, Retina - OU - Both Eyes    Intravitreal Injection, Pharmacologic Agent - OD - Right Eye    Bevacizumab (AVASTIN) SOLN 1.25 mg    2. Intermediate stage nonexudative age-related macular degeneration of left eye  H35.3122     3. Amblyopia of eye, left  H53.002     4. Primary open angle glaucoma (POAG) of both eyes, moderate stage  H40.1132     5. Essential hypertension  I10     6. Hypertensive retinopathy of both eyes  H35.033     7. Pseudophakia of both eyes  Z96.1     8. Dry eyes, bilateral  H04.123      1. 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             - s/p IVA OD # 1 (07.29.21), #2 (08.30.21), #3 (09.27.21), #4 (11.02.21), #5 (12.13.21), #6 (01.31.22), #7 (03.24.22), #8 (09.26.22), #9 (11.14.22), #10 (01.03.23), #11 (02.27.23), #12 (05.08.23)  - excellent initial response and maintenance  - BCVA 20/20, stable  - [facility] over[facility] IT fovea at 12 wks  -  discussed findings and treatment options   - recommend IVA OD #13 today, 07.31,23 w/ f/u in 14 wks  - pt in agreement  - RBA of procedure discussed, questions answered - new IVA informed consent obtained, signed and scanned 05.08.23 - see procedure note  - f/u in 14 wks, sooner prn -- DFE, OCT  2. Age related macular degeneration, non-exudative, OS  - The incidence, anatomy,  and pathology of dry AMD, risk of progression, and the AREDS and AREDS 2 studies including smoking risks discussed with patient.  - recommend Amsler grid monitoring  3. Amblyopia OS  - long standing  - baseline BCVA ~20/50-60  - monitor  4. POAG OU  - was under the expert management of Dr. Kathlen Mody  - on timolol BID OU per Dr. Kathlen Mody  - IOP  5,6. Hypertensive retinopathy OU - discussed importance of tight BP control - monitor  7. Pseudophakia OU  - s/p CE/IOL  - IOL in good position, doing well  - monitor  8. Dry eyes OU (OD>OS)  - previously managed by Dr. Kathlen Mody   Ophthalmic Meds Ordered this visit:  Meds ordered this encounter  Medications   Bevacizumab (AVASTIN) SOLN 1.25 mg     Return in about 14 weeks (around 04/11/2022) for f/u exu ARMD OD, DFE, OCT.  There are no Patient Instructions on file for this visit.  This document serves as a record of services personally performed by Gardiner Sleeper, MD, PhD. It was created on their behalf by Roselee Nova, COMT. The creation of this record is the provider's dictation and/or activities during the visit.  Electronically signed by: Roselee Nova, COMT 01/03/22 11:50 AM  This document serves as a record of services personally performed by Gardiner Sleeper, MD, PhD. It was created on their behalf by San Jetty. Owens Shark, OA an ophthalmic technician. The creation of this record is the provider's dictation and/or activities during the visit.    Electronically signed by: San Jetty. Owens Shark, [place] 07.31.2023 11:50 AM   Gardiner Sleeper, M.D., Ph.D. Diseases & Surgery of the Retina and Vitreous [facility]  I have reviewed the above documentation for accuracy and completeness, and I agree with the above. Gardiner Sleeper, M.D., Ph.D. 01/03/22 11:51 AM   Abbreviations: M myopia (nearsighted); A astigmatism; H hyperopia (farsighted); P presbyopia; Mrx spectacle prescription;  CTL contact lenses; OD right eye; OS left  eye; OU both eyes  XT exotropia; ET esotropia; PEK punctate epithelial keratitis; PEE punctate epithelial erosions; DES dry eye syndrome; MGD meibomian gland dysfunction; ATs artificial tears; PFAT's preservative free artificial tears; [place] nuclear sclerotic cataract; PSC posterior subcapsular cataract; ERM epi-retinal membrane; PVD posterior vitreous detachment; RD retinal detachment; DM diabetes mellitus; DR diabetic retinopathy; NPDR non-proliferative diabetic retinopathy; PDR proliferative diabetic retinopathy; CSME clinically significant macular edema; DME diabetic macular edema; dbh dot blot hemorrhages; CWS cotton wool spot; POAG primary open angle glaucoma; C/D cup-to-disc ratio; HVF humphrey visual field; GVF goldmann visual field; OCT optical coherence tomography; IOP intraocular pressure; BRVO Branch retinal vein occlusion; CRVO central retinal vein occlusion; CRAO central retinal artery occlusion; BRAO branch retinal artery occlusion; RT retinal tear; SB scleral buckle; PPV pars plana vitrectomy; VH Vitreous hemorrhage; PRP panretinal laser photocoagulation; IVK intravitreal kenalog; VMT vitreomacular traction; MH Macular hole;  NVD neovascularization of the disc; NVE neovascularization elsewhere; AREDS age related eye disease study; ARMD age related macular degeneration; POAG primary open angle glaucoma; EBMD epithelial/anterior basement membrane dystrophy; ACIOL anterior  chamber intraocular lens; IOL intraocular lens; PCIOL posterior chamber intraocular lens; Phaco/IOL phacoemulsification with intraocular lens placement; [place] photorefractive keratectomy; LASIK laser assisted in situ keratomileusis; HTN hypertension; DM diabetes mellitus; COPD chronic obstructive pulmonary disease

## 2022-01-03 ENCOUNTER — Ambulatory Visit (INDEPENDENT_AMBULATORY_CARE_PROVIDER_SITE_OTHER): Payer: Medicare Other | Admitting: Ophthalmology

## 2022-01-03 ENCOUNTER — Encounter (INDEPENDENT_AMBULATORY_CARE_PROVIDER_SITE_OTHER): Payer: Self-pay | Admitting: Ophthalmology

## 2022-01-03 DIAGNOSIS — H04123 Dry eye syndrome of bilateral lacrimal glands: Secondary | ICD-10-CM

## 2022-01-03 DIAGNOSIS — H353211 Exudative age-related macular degeneration, right eye, with active choroidal neovascularization: Secondary | ICD-10-CM | POA: Diagnosis not present

## 2022-01-03 DIAGNOSIS — H35033 Hypertensive retinopathy, bilateral: Secondary | ICD-10-CM

## 2022-01-03 DIAGNOSIS — H353122 Nonexudative age-related macular degeneration, left eye, intermediate dry stage: Secondary | ICD-10-CM | POA: Diagnosis not present

## 2022-01-03 DIAGNOSIS — H53002 Unspecified amblyopia, left eye: Secondary | ICD-10-CM

## 2022-01-03 DIAGNOSIS — H401132 Primary open-angle glaucoma, bilateral, moderate stage: Secondary | ICD-10-CM | POA: Diagnosis not present

## 2022-01-03 DIAGNOSIS — I10 Essential (primary) hypertension: Secondary | ICD-10-CM

## 2022-01-03 DIAGNOSIS — Z961 Presence of intraocular lens: Secondary | ICD-10-CM

## 2022-01-03 MED ORDER — BEVACIZUMAB CHEMO INJECTION 1.25MG/0.05ML SYRINGE FOR KALEIDOSCOPE
1.2500 mg | INTRAVITREAL | Status: AC | PRN
  Administered 2022-01-03: 1.25 mg via INTRAVITREAL

## 2022-01-17 ENCOUNTER — Other Ambulatory Visit: Payer: Self-pay | Admitting: Neurology

## 2022-03-31 ENCOUNTER — Other Ambulatory Visit: Payer: Self-pay | Admitting: Internal Medicine

## 2022-03-31 ENCOUNTER — Ambulatory Visit
Admission: RE | Admit: 2022-03-31 | Discharge: 2022-03-31 | Disposition: A | Discharge status: Home / Self Care | Payer: Medicare Other | Source: Ambulatory Visit | Attending: Internal Medicine | Admitting: Internal Medicine

## 2022-03-31 DIAGNOSIS — R059 Cough, unspecified: Secondary | ICD-10-CM

## 2022-03-31 DIAGNOSIS — J069 Acute upper respiratory infection, unspecified: Secondary | ICD-10-CM | POA: Insufficient documentation

## 2022-03-31 DIAGNOSIS — R0989 Other specified symptoms and signs involving the circulatory and respiratory systems: Secondary | ICD-10-CM

## 2022-04-04 ENCOUNTER — Encounter: Payer: Self-pay | Admitting: Neurology

## 2022-04-06 NOTE — Progress Notes (Shared)
[facility] Note  04/11/2022     CHIEF COMPLAINT Patient presents for Retina Follow Up  HISTORY OF PRESENT ILLNESS: Kayla Hurley is a 87 y.o. female who presents to the clinic today for:   HPI     Retina Follow Up   Patient presents with  Wet AMD.  In both eyes.  This started 14 weeks ago.  I, the attending physician,  performed the HPI with the patient and updated documentation appropriately.      Last edited by Bernarda Caffey, MD on 04/12/2022  1:04 AM.     Pt states she is just getting over pneumonia, her eyes are dry, but vision is stable  Referring physician: Lisabeth Pick, MD [place],  [place] [id number]  HISTORICAL INFORMATION:   Selected notes from the MEDICAL RECORD NUMBER Referred by Dr. Kathlen Mody for concern of exu ARMD OD   CURRENT MEDICATIONS: Current Outpatient Medications (Ophthalmic Drugs)  Medication Sig   Propylene Glycol (SYSTANE BALANCE OP) Apply to eye.   timolol (BETIMOL) 0.25 % ophthalmic solution Place 1 drop into both eyes 2 (two) times daily.   No current facility-administered medications for this visit. (Ophthalmic Drugs)   Current Outpatient Medications (Other)  Medication Sig   Calcium-Phosphorus-Vitamin D (CITRACAL +D3) [phone] MG-MG-UNIT CHEW See admin instructions.   diclofenac Sodium (VOLTAREN) 1 % GEL Apply topically.   folic acid (FOLVITE) 1 MG tablet Take 1 mg by mouth daily. Take two tablets daily   hydrochlorothiazide (HYDRODIURIL) 25 MG tablet Take 25 mg by mouth daily.   leucovorin (WELLCOVORIN) 5 MG tablet TAKE 2 TABLETS BY MOUTH ONCE A WEEK   levothyroxine (SYNTHROID) 50 MCG tablet Take 50 mcg by mouth daily before breakfast.   methotrexate (RHEUMATREX) 2.5 MG tablet Take 10 mg by mouth once a week. Caution:Chemotherapy. Protect from light.   Multiple Vitamins-Minerals (PRESERVISION AREDS 2+MULTI VIT) CAPS See admin instructions.   nicotine polacrilex (COMMIT) 2 MG lozenge Place  inside cheek.   potassium chloride (KLOR-CON) 20 MEQ packet Take 20 mEq by mouth 2 (two) times daily.   primidone (MYSOLINE) 50 MG tablet TAKE 1 TABLET BY MOUTH AT BEDTIME   salsalate (DISALCID) 750 MG tablet Take 2 tablets by mouth 2 (two) times daily.   ALPRAZolam (XANAX) 0.25 MG tablet Take 0.25 mg by mouth at bedtime as needed for anxiety. (Patient not taking: Reported on 11/03/2021)   propranolol (INDERAL) 10 MG tablet Take 1 tablet (10 mg total) by mouth 3 (three) times daily. (Patient not taking: Reported on 11/03/2021)   No current facility-administered medications for this visit. (Other)   REVIEW OF SYSTEMS: ROS   Positive for: Respiratory Negative for: Constitutional, Gastrointestinal, Neurological, Skin, Genitourinary, Musculoskeletal, HENT, Endocrine, Cardiovascular, Eyes, Psychiatric, Allergic/Imm, Heme/Lymph Last edited by Bernarda Caffey, MD on 04/12/2022  1:05 AM.     ALLERGIES Allergies  Allergen Reactions   Adhesive [Tape] Other (See Comments)    blisters   Penicillins Diarrhea, Nausea And Vomiting and Rash   PAST MEDICAL HISTORY Past Medical History:  Diagnosis Date   COPD (chronic obstructive pulmonary disease) ([place])    Glaucoma    Hypertensive retinopathy    Macular degeneration    Meningioma ([place])    Rheumatoid arthritis ([place])    Past Surgical History:  Procedure Laterality Date   APPENDECTOMY     CATARACT EXTRACTION     CESAREAN SECTION     x 3   CHOLECYSTECTOMY     CRANIECTOMY /  CRANIOTOMY FOR EXCISION OF BRAIN TUMOR     meningioma   EYE SURGERY     HYSTEROSCOPY WITH D & C     INGUINAL HERNIA REPAIR     YAG LASER APPLICATION     FAMILY HISTORY Family History  Problem Relation Age of Onset   Other Mother        died during open heart surgery   Heart disease Father    Diabetes Son    SOCIAL HISTORY Social History   Tobacco Use   Smoking status: Former   Smokeless tobacco: Never  Scientific laboratory technician Use: Never used  Substance Use Topics    Alcohol use: Not Currently    Comment: occasional   Drug use: Never       OPHTHALMIC EXAM:  Base Eye Exam     Visual Acuity (Snellen - Linear)       Right Left   Dist cc 20/20 20/60 +2   Dist ph cc  NI         Tonometry (Tonopen, 10:05 AM)       Right Left   Pressure 15 17         Pupils       Dark Light Shape React APD   Right 2 1 Round Brisk None   Left 2 1 Round Brisk None         Visual Fields (Counting fingers)       Left Right    Full Full         Extraocular Movement       Right Left    Full, Ortho Full, Ortho         Neuro/Psych     Oriented x3: Yes   Mood/Affect: Normal         Dilation     Both eyes:            Slit Lamp and Fundus Exam     Slit Lamp Exam       Right Left   Lids/Lashes Dermato Dermato, mild MGD   Conjunctiva/Sclera White and quiet White and quiet   Cornea Mild arcus, 1-2+PEE, mild tear film debris 3+ inferior PEE, arcus   Anterior Chamber Deep and quiet Deep and quiet   Iris Round and dilated Round and dilated   Lens PCIOL in excellent position, open pc PCIOL in excellent position   Anterior Vitreous Syneresis, silicone oil micro bubbles syneresis         Fundus Exam       Right Left   Disc Pink, sharp, +cupping and central pallor, mild temporal PPA Pink and sharp, +cupping   C/D Ratio 0.7 0.6   Macula Blunted foveal reflex, drusen, RPE mottling, clumping and atrophy, +CNV with stable improvement in shallow SRF/edema, no frank heme, +GA Flat, blunted foveal reflex, Drusen, RPE mottling, clumping and atrophy, +GA superior and nasal to fovea, No heme or edema   Vessels attenuated, Tortuous attenuated, Tortuous   Periphery Attached, mild peripheral drusen, mild reticular degeneration, focal paving stone inferiorly, no heme Attached, mild peripheral drusen and mild reticular degeneration, paving stone inferiorly           Refraction     Wearing Rx       Sphere Cylinder Axis Add   Right  -0.50 +1.75 010 +2.50   Left -2.50 +2.00 175 +2.50           IMAGING AND PROCEDURES  Imaging and Procedures for 04/11/2022  OCT, Retina - OU - Both Eyes       Right Eye Quality was good. Central Foveal Thickness: 283. Progression has been stable. Findings include normal foveal contour, no IRF, no SRF, retinal drusen , pigment epithelial detachment, outer retinal atrophy (Stable improvement in [facility] and edema overlying PED IT fovea, mild interval progression of GA on en face image).   Left Eye Quality was good. Central Foveal Thickness: 258. Progression has been stable. Findings include normal foveal contour, no IRF, no SRF, retinal drusen , outer retinal atrophy (Mild progression of GA on en face imaging).   Notes *Images captured and stored on drive  Diagnosis / Impression:  OD: exudative ARMD -- Stable improvement in [facility] and edema overlying PED IT fovea, mild interval progression of GA on en face image OS: nonexudative ARMD;  NFP, no IRF/SRF -- Mild progression of GA on en face imaging  Clinical management:  See below  Abbreviations: NFP - Normal foveal profile. CME - cystoid macular edema. PED - pigment epithelial detachment. IRF - intraretinal fluid. SRF - subretinal fluid. EZ - ellipsoid zone. ERM - epiretinal membrane. ORA - outer retinal atrophy. ORT - outer retinal tubulation. SRHM - subretinal hyper-reflective material. IRHM - intraretinal hyper-reflective material            ASSESSMENT/PLAN:    ICD-10-CM   1. Exudative age-related macular degeneration of right eye with active choroidal neovascularization (HCC)  H35.3211 OCT, Retina - OU - Both Eyes    CANCELED: Intravitreal Injection, Pharmacologic Agent - OD - Right Eye    2. Intermediate stage nonexudative age-related macular degeneration of left eye  H35.3122     3. Amblyopia of eye, left  H53.002     4. Primary open angle glaucoma (POAG) of both eyes, moderate stage  H40.1132     5. Essential  hypertension  I10     6. Hypertensive retinopathy of both eyes  H35.033     7. Pseudophakia of both eyes  Z96.1     8. Dry eyes, bilateral  H04.123       1. 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             - s/p IVA OD # 1 (07.29.21), #2 (08.30.21), #3 (09.27.21), #4 (11.02.21), #5 (12.13.21), #6 (01.31.22), #7 (03.24.22), #8 (09.26.22), #9 (11.14.22), #10 (01.03.23), #11 (02.27.23), #12 (05.08.23), #13 (07.31.23)  - excellent initial response and maintenance  - BCVA 20/20, stable  - OCT shows stable improvement in [facility] and edema overlying PED IT fovea at 14 wks, mild interval progression of GA on en face image  - discussed findings and treatment options   - recommend holding IVA today with follow up in 10 weeks  - pt in agreement - new IVA informed consent obtained, signed and scanned 05.08.23  - f/u in 10 wks, sooner prn -- DFE, OCT  2. Age related macular degeneration, non-exudative, OS  - The incidence, anatomy, and pathology of dry AMD, risk of progression, and the AREDS and AREDS 2 studies including smoking risks discussed with patient.  - recommend [place] grid monitoring  3. Amblyopia OS  - long standing  - baseline BCVA ~20/50-60  - monitor  4. POAG OU  - was under the expert management of Dr. Kathlen Mody  - on timolol BID OU per Dr. Kathlen Mody  - IOP  5,6. Hypertensive retinopathy OU - discussed  importance of tight BP control - monitor  7. Pseudophakia OU  - s/p CE/IOL  - IOL in good position, doing well  - monitor  8. Dry eyes OU (OD>OS)  - previously managed by Dr Kathlen Mody   Ophthalmic Meds Ordered this visit:  No orders of the defined types were placed in this encounter.    Return in about 10 weeks (around 06/20/2022) for f/u 10 weeks, exu ARMD OD, DFE, OCT.  There are no Patient Instructions on file for this visit.  This document serves as a record of  services personally performed by Gardiner Sleeper, MD, PhD. It was created on their behalf by Roselee Nova, COMT. The creation of this record is the provider's dictation and/or activities during the visit.  Electronically signed by: Roselee Nova, COMT 04/12/22 1:07 AM  This document serves as a record of services personally performed by Gardiner Sleeper, MD, PhD. It was created on their behalf by San Jetty. Owens Shark, OA an ophthalmic technician. The creation of this record is the provider's dictation and/or activities during the visit.    Electronically signed by: San Jetty. Owens Shark, [place] 11.06.2023 1:07 AM   Gardiner Sleeper, M.D., Ph.D. Diseases & Surgery of the Retina and Vitreous [place]  I have reviewed the above documentation for accuracy and completeness, and I agree with the above. Gardiner Sleeper, M.D., Ph.D. 04/12/22 1:07 AM   Abbreviations: M myopia (nearsighted); A astigmatism; H hyperopia (farsighted); P presbyopia; Mrx spectacle prescription;  CTL contact lenses; OD right eye; OS left eye; OU both eyes  XT exotropia; ET esotropia; PEK punctate epithelial keratitis; PEE punctate epithelial erosions; DES dry eye syndrome; MGD meibomian gland dysfunction; ATs artificial tears; PFAT's preservative free artificial tears; [place] nuclear sclerotic cataract; PSC posterior subcapsular cataract; ERM epi-retinal membrane; PVD posterior vitreous detachment; RD retinal detachment; DM diabetes mellitus; DR diabetic retinopathy; NPDR non-proliferative diabetic retinopathy; PDR proliferative diabetic retinopathy; CSME clinically significant macular edema; DME diabetic macular edema; dbh dot blot hemorrhages; CWS cotton wool spot; POAG primary open angle glaucoma; C/D cup-to-disc ratio; HVF humphrey visual field; GVF goldmann visual field; OCT optical coherence tomography; IOP intraocular pressure; BRVO Branch retinal vein occlusion; CRVO central retinal vein occlusion; CRAO central retinal  artery occlusion; BRAO branch retinal artery occlusion; RT retinal tear; SB scleral buckle; PPV pars plana vitrectomy; VH Vitreous hemorrhage; PRP panretinal laser photocoagulation; IVK intravitreal kenalog; VMT vitreomacular traction; MH Macular hole;  NVD neovascularization of the disc; NVE neovascularization elsewhere; AREDS age related eye disease study; ARMD age related macular degeneration; POAG primary open angle glaucoma; EBMD epithelial/anterior basement membrane dystrophy; ACIOL anterior chamber intraocular lens; IOL intraocular lens; PCIOL posterior chamber intraocular lens; Phaco/IOL phacoemulsification with intraocular lens placement; [place] photorefractive keratectomy; LASIK laser assisted in situ keratomileusis; HTN hypertension; DM diabetes mellitus; COPD chronic obstructive pulmonary disease

## 2022-04-11 ENCOUNTER — Ambulatory Visit (INDEPENDENT_AMBULATORY_CARE_PROVIDER_SITE_OTHER): Payer: Medicare Other | Admitting: Ophthalmology

## 2022-04-11 ENCOUNTER — Encounter (INDEPENDENT_AMBULATORY_CARE_PROVIDER_SITE_OTHER): Payer: Self-pay | Admitting: Ophthalmology

## 2022-04-11 DIAGNOSIS — H35033 Hypertensive retinopathy, bilateral: Secondary | ICD-10-CM

## 2022-04-11 DIAGNOSIS — H53002 Unspecified amblyopia, left eye: Secondary | ICD-10-CM

## 2022-04-11 DIAGNOSIS — H401132 Primary open-angle glaucoma, bilateral, moderate stage: Secondary | ICD-10-CM | POA: Diagnosis not present

## 2022-04-11 DIAGNOSIS — H353122 Nonexudative age-related macular degeneration, left eye, intermediate dry stage: Secondary | ICD-10-CM | POA: Diagnosis not present

## 2022-04-11 DIAGNOSIS — H353211 Exudative age-related macular degeneration, right eye, with active choroidal neovascularization: Secondary | ICD-10-CM | POA: Diagnosis not present

## 2022-04-11 DIAGNOSIS — Z961 Presence of intraocular lens: Secondary | ICD-10-CM

## 2022-04-11 DIAGNOSIS — H04123 Dry eye syndrome of bilateral lacrimal glands: Secondary | ICD-10-CM

## 2022-04-11 DIAGNOSIS — I10 Essential (primary) hypertension: Secondary | ICD-10-CM

## 2022-04-12 ENCOUNTER — Encounter (INDEPENDENT_AMBULATORY_CARE_PROVIDER_SITE_OTHER): Payer: Self-pay | Admitting: Ophthalmology

## 2022-04-19 ENCOUNTER — Ambulatory Visit (INDEPENDENT_AMBULATORY_CARE_PROVIDER_SITE_OTHER): Payer: Medicare Other | Admitting: Nurse Practitioner

## 2022-04-19 ENCOUNTER — Encounter: Payer: Self-pay | Admitting: Nurse Practitioner

## 2022-04-19 VITALS — BP 112/60 | HR 64 | Temp 98.0°F | Ht 62.0 in | Wt 140.6 lb

## 2022-04-19 DIAGNOSIS — J189 Pneumonia, unspecified organism: Secondary | ICD-10-CM | POA: Diagnosis not present

## 2022-04-19 DIAGNOSIS — J4489 Other specified chronic obstructive pulmonary disease: Secondary | ICD-10-CM

## 2022-04-19 MED ORDER — LEVALBUTEROL TARTRATE 45 MCG/ACT IN AERO: 2.0000 | INHALATION_SPRAY | Freq: Four times a day (QID) | RESPIRATORY_TRACT | 2 refills | Status: DC | PRN

## 2022-04-19 NOTE — Progress Notes (Signed)
$'@Patient'a$  ID: Kayla Hurley, female    DOB: 1934-07-14, 87 y.o.   MRN: [id number]  Chief Complaint  Patient presents with   Follow-up    Sob occass., dry cough    Referring provider: Margretta Sidle, MD  HPI: 87 year old female, former smoker followed for COPD with asthma. She is a patient of Dr. Cordelia Pen and last seen in office on 11/03/2021. Past medical history significant for RA on methotrexate, osteoporosis, HTN, hypothyroid, B12 deficiency, essential tremor.   TEST/EVENTS:  11/03/2021 PFT: FVC 82, FEV1 76, ratio 73, DLCOcor 85%.  Positive BD with 17% change 03/31/2022 CXR 2 view: Bronchial wall thickening.  There are reticulonodular opacities at the anterior lungs on the lateral view.  11/03/2021: OV with Dr. Loanne Drilling for follow up. Chronic dry cough. Improved with DayQuil and flonase. No limitation in activity from a respiratory standpoint. During PFT's, she did not like the albuterol as it made her tremor worse. Ok to stop using it. May consider low dose ICS/LABA in the future. Follow up as needed.   04/19/2022: Today - follow up Patient presents today for follow up. Around 10/26, she developed weakness and a persistent cough, that was occasionally productive with green to yellow sputum. She went to her PCP who ordered a chest xray but she never got the results back from them so she went to urgent care on 10/29. They diagnosed her with pneumonia and treated her with antibiotics. She isn't sure exactly which medication they treated her with but she completed it and has been feeling better. She went back to urgent care on 11/5 for a follow up with repeat x ray and was told that her pneumonia had resolved. Today, she tells me she's been feeling a lot better. Feels like she's building her strength back up. She still has some occasional chest congestion but cough has cleared up and is back to her baseline. She denies any significant shortness of breath. No fevers, chills, hemoptysis. She was  doing well from a respiratory standpoint prior to this; no dyspnea that limits activity. She is not currently on any maintenance inhalers. Hesitant to start anything with her worsening tremor following albuterol during PFTs.   Allergies  Allergen Reactions   Adhesive [Tape] Other (See Comments)    blisters   Penicillins Diarrhea, Nausea And Vomiting and Rash     There is no immunization history on file for this patient.  Past Medical History:  Diagnosis Date   COPD (chronic obstructive pulmonary disease) (HCC)    Glaucoma    Hypertensive retinopathy    Macular degeneration    Meningioma (HCC)    Rheumatoid arthritis (HCC)     Tobacco History: Social History   Tobacco Use  Smoking Status Former  Smokeless Tobacco Never   Counseling given: Not Answered   Outpatient Medications Prior to Visit  Medication Sig Dispense Refill   Calcium-Phosphorus-Vitamin D (CITRACAL +D3) [phone] MG-MG-UNIT CHEW See admin instructions.     diclofenac Sodium (VOLTAREN) 1 % GEL Apply topically.     folic acid (FOLVITE) 1 MG tablet Take 1 mg by mouth daily. Take two tablets daily     hydrochlorothiazide (HYDRODIURIL) 25 MG tablet Take 25 mg by mouth daily.     leucovorin (WELLCOVORIN) 5 MG tablet TAKE 2 TABLETS BY MOUTH ONCE A WEEK     levothyroxine (SYNTHROID) 50 MCG tablet Take 50 mcg by mouth daily before breakfast.     methotrexate (RHEUMATREX) 2.5 MG tablet Take 10 mg by mouth  once a week. Caution:Chemotherapy. Protect from light.     Multiple Vitamins-Minerals (PRESERVISION AREDS 2+MULTI VIT) CAPS See admin instructions.     potassium chloride (KLOR-CON) 20 MEQ packet Take 20 mEq by mouth 2 (two) times daily.     primidone (MYSOLINE) 50 MG tablet TAKE 1 TABLET BY MOUTH AT BEDTIME 90 tablet 1   propranolol (INDERAL) 10 MG tablet Take 1 tablet (10 mg total) by mouth 3 (three) times daily. 270 tablet 1   Propylene Glycol (SYSTANE BALANCE OP) Apply to eye.     salsalate (DISALCID) 750 MG  tablet Take 2 tablets by mouth 2 (two) times daily.     timolol (BETIMOL) 0.25 % ophthalmic solution Place 1 drop into both eyes 2 (two) times daily.     ALPRAZolam (XANAX) 0.25 MG tablet Take 0.25 mg by mouth at bedtime as needed for anxiety. (Patient not taking: Reported on 11/03/2021)     nicotine polacrilex (COMMIT) 2 MG lozenge Place inside cheek. (Patient not taking: Reported on 04/19/2022)     No facility-administered medications prior to visit.     Review of Systems:   Constitutional: No weight loss or gain, night sweats, fevers, chills. +fatigue, lassitude (improving) HEENT: No headaches, difficulty swallowing, tooth/dental problems, or sore throat. No sneezing, itching, ear ache, nasal congestion, or post nasal drip CV:  No chest pain, orthopnea, PND, swelling in lower extremities, anasarca, dizziness, palpitations, syncope Resp: +chest congestion; chronic dry cough. No shortness of breath with exertion or at rest. No productive or non-productive. No hemoptysis. No wheezing.  No chest wall deformity GI:  No heartburn, indigestion, abdominal pain, nausea, vomiting, diarrhea, change in bowel habits, loss of appetite, bloody stools.  MSK:  No joint pain or swelling.  No decreased range of motion.  No back pain. Neuro: No dizziness or lightheadedness.  Psych: No depression or anxiety. Mood stable.     Physical Exam:  BP 112/60 (BP Location: Right Arm, Cuff Size: Normal)   Pulse 64   Temp 98 F (36.7 C) (Temporal)   Ht '5\' 2"'$  (1.575 m)   Wt 140 lb 9.6 oz (63.8 kg)   SpO2 99%   BMI 25.72 kg/m   GEN: Pleasant, interactive, well-developed; elderly; in no acute distress. HEENT:  Normocephalic and atraumatic. PERRLA. Sclera white. Nasal turbinates pink, moist and patent bilaterally. No rhinorrhea present. Oropharynx pink and moist, without exudate or edema. No lesions, ulcerations, or postnasal drip.  NECK:  Supple w/ fair ROM. No JVD present. Normal carotid impulses w/o bruits.  Thyroid symmetrical with no goiter or nodules palpated. No lymphadenopathy.   CV: RRR, no m/r/g, no peripheral edema. Pulses intact, +2 bilaterally. No cyanosis, pallor or clubbing. PULMONARY:  Unlabored, regular breathing. Diminished bilaterally A&P w/o wheezes/rales/rhonchi. No accessory muscle use.  GI: BS present and normoactive. Soft, non-tender to palpation. No organomegaly or masses detected.  MSK: No erythema, warmth or tenderness. Cap refil <2 sec all extrem. No deformities or joint swelling noted.  Neuro: A/Ox3. No focal deficits noted.   Skin: Warm, no lesions or rashe Psych: Normal affect and behavior. Judgement and thought content appropriate.     Lab Results:  CBC No results found for: "WBC", "RBC", "HGB", "HCT", "PLT", "MCV", "MCH", "MCHC", "RDW", "LYMPHSABS", "MONOABS", "EOSABS", "BASOSABS"  BMET No results found for: "NA", "K", "CL", "CO2", "GLUCOSE", "BUN", "CREATININE", "CALCIUM", "GFRNONAA", "GFRAA"  BNP No results found for: "BNP"   Imaging:  OCT, Retina - OU - Both Eyes  Result Date: 04/11/2022 Right Eye Quality  was good. Central Foveal Thickness: 283. Progression has been stable. Findings include normal foveal contour, no IRF, no SRF, retinal drusen , pigment epithelial detachment, outer retinal atrophy (Stable improvement in [facility] and edema overlying PED IT fovea, mild interval progression of GA on en face image). Left Eye Quality was good. Central Foveal Thickness: 258. Progression has been stable. Findings include normal foveal contour, no IRF, no SRF, retinal drusen , outer retinal atrophy (Mild progression of GA on en face imaging). Notes *Images captured and stored on drive Diagnosis / Impression: OD: exudative ARMD -- Stable improvement in [facility] and edema overlying PED IT fovea, mild interval progression of GA on en face image OS: nonexudative ARMD;  NFP, no IRF/SRF -- Mild progression of GA on en face imaging Clinical management: See below Abbreviations: NFP -  Normal foveal profile. CME - cystoid macular edema. PED - pigment epithelial detachment. IRF - intraretinal fluid. SRF - subretinal fluid. EZ - ellipsoid zone. ERM - epiretinal membrane. ORA - outer retinal atrophy. ORT - outer retinal tubulation. SRHM - subretinal hyper-reflective material. IRHM - intraretinal hyper-reflective material   DG Chest 2 View  Result Date: 04/02/2022 CLINICAL DATA:  87 year old female with cough and congestion EXAM: CHEST - 2 VIEW COMPARISON:  None Available. FINDINGS: Cardiomediastinal silhouette within normal limits. No evidence of central vascular congestion. Bronchial wall thickening. Lateral view demonstrates reticulonodular opacities at the anterior lungs. No pneumothorax or pleural effusion. Degenerative changes of the spine IMPRESSION: Bronchial wall thickening and reticular opacities at the anterior lungs on the lateral view, potentially atypical infection/bronchitis versus chronic changes/fibrosis. No comparison available. Followup PA and lateral chest X-ray in 3-4 weeks may be useful following therapy. Electronically Signed   By: Corrie Mckusick D.O.   On: 04/02/2022 08:42         Latest Ref Rng & Units 11/03/2021   12:03 PM  PFT Results  FVC-Pre L 1.86   FVC-Predicted Pre % 82   FVC-Post L 2.03   FVC-Predicted Post % 89   Pre FEV1/FVC % % 68   Post FEV1/FCV % % 73   FEV1-Pre L 1.26   FEV1-Predicted Pre % 76   FEV1-Post L 1.48   DLCO uncorrected ml/min/mmHg 15.02   DLCO UNC% % 85   DLCO corrected ml/min/mmHg 15.02   DLCO COR %Predicted % 85   DLVA Predicted % 95     No results found for: "NITRICOXIDE"      Assessment & Plan:   CAP (community acquired pneumonia) Recently treated for CAP by Urgent Care. Her CXR from 10/26, obtained by her PCP, showed increased reticulonodular opacities and bronchial wall thickening. She had repeat imaging on 11/5, which per her report, showed resolution. She is clinically improved today. Encouraged her to start  therapies for mucociliary clearance to help with her chest congestion. Plan to follow up in 6 weeks; may repeat imaging at that time.  Patient Instructions  Start levalbuterol (Xopenex) 1-2 puffs every 6 hours as needed for shortness of breath or wheezing. You can use it twice daily until your chest congestion returns to baseline Follow the inhaler with a flutter valve twice a day Mucinex (guaifenesin) 600 mg Twice daily for chest congestion  Follow up in 6 weeks with Dr. Loanne Drilling or [place]. If symptoms do not improve or worsen, please contact office for sooner follow up or seek emergency care.    COPD with asthma ([place]) COPD with asthma; mild obstruction. She has a very low symptom burden at baseline.  Hesitant regarding inhalers. Discussed PRN option with Xopenex. She was agreeable to this. Advised her to monitor her tremor and notify of any worsening. Could consider ICS/LABA therapy in the future but she preferred to hold off on any daily regimen for now. Asthma action plan in place.    I spent 35 minutes of dedicated to the care of this patient on the date of this encounter to include pre-visit review of records, face-to-face time with the patient discussing conditions above, post visit ordering of testing, clinical documentation with the electronic health record, making appropriate referrals as documented, and communicating necessary findings to members of the patients care team.  Clayton Bibles, NP 04/19/2022  Pt aware and understands NP's role.

## 2022-04-19 NOTE — Assessment & Plan Note (Addendum)
COPD with asthma; mild obstruction. She has a very low symptom burden at baseline. Hesitant regarding inhalers. Discussed PRN option with Xopenex. She was agreeable to this. Advised her to monitor her tremor and notify of any worsening. Could consider ICS/LABA therapy in the future but she preferred to hold off on any daily regimen for now. Asthma action plan in place.

## 2022-04-19 NOTE — Patient Instructions (Signed)
Start levalbuterol (Xopenex) 1-2 puffs every 6 hours as needed for shortness of breath or wheezing. You can use it twice daily until your chest congestion returns to baseline Follow the inhaler with a flutter valve twice a day Mucinex (guaifenesin) 600 mg Twice daily for chest congestion  Follow up in 6 weeks with Dr. Loanne Drilling or [place]. If symptoms do not improve or worsen, please contact office for sooner follow up or seek emergency care.

## 2022-04-19 NOTE — Assessment & Plan Note (Signed)
Recently treated for CAP by Urgent Care. Her CXR from 10/26, obtained by her PCP, showed increased reticulonodular opacities and bronchial wall thickening. She had repeat imaging on 11/5, which per her report, showed resolution. She is clinically improved today. Encouraged her to start therapies for mucociliary clearance to help with her chest congestion. Plan to follow up in 6 weeks; may repeat imaging at that time.  Patient Instructions  Start levalbuterol (Xopenex) 1-2 puffs every 6 hours as needed for shortness of breath or wheezing. You can use it twice daily until your chest congestion returns to baseline Follow the inhaler with a flutter valve twice a day Mucinex (guaifenesin) 600 mg Twice daily for chest congestion  Follow up in 6 weeks with Dr. Loanne Drilling or [place]. If symptoms do not improve or worsen, please contact office for sooner follow up or seek emergency care.

## 2022-04-22 ENCOUNTER — Telehealth: Payer: Self-pay | Admitting: Physician Assistant

## 2022-04-22 ENCOUNTER — Telehealth: Payer: Self-pay | Admitting: Nurse Practitioner

## 2022-04-22 DIAGNOSIS — J4489 Other specified chronic obstructive pulmonary disease: Secondary | ICD-10-CM

## 2022-04-22 MED ORDER — LEVALBUTEROL TARTRATE 45 MCG/ACT IN AERO: 2.0000 | INHALATION_SPRAY | Freq: Four times a day (QID) | RESPIRATORY_TRACT | 5 refills | Status: DC | PRN

## 2022-04-22 NOTE — Telephone Encounter (Signed)
Called and spoke with patient. She was requesting to have her Xopenex sent to [facility]. Advised her that I would go ahead and send in the refill for her.   Nothing further needed at time of call.

## 2022-04-22 NOTE — Telephone Encounter (Signed)
Spoke with patient confirming new patient appointment 12/5

## 2022-05-09 NOTE — Progress Notes (Signed)
[place] [phone]   Fax:([phone] [place] NOTE  Patient Care Team: Margretta Sidle, MD as PCP - General Tat, Eustace Quail, DO as Consulting Physician (Neurology)  Hematological/Oncological History -04/15/2022: Labs from rheumatologist, Dr. Lenetta Quaker Aryal-Hgb 12.9, WBC 4.8, Plt 181, ESR 8, CRP 0.58 (H). Blood smear showed high number of atypical lymphocytes measuring approximately 16%.   -05/10/2022: Establish care with [facility] Hematology with Dr. Narda Rutherford and Dede Query PA-C  CHIEF COMPLAINTS/PURPOSE OF CONSULTATION:  Atypical lymphocytes  HISTORY OF PRESENTING ILLNESS:  Kayla Hurley 87 y.o. female with medical history significant for rheumatoid arthritis, meningioma status craniotomy and excision, COPD, glaucoma, hypertensive retinopathy and macular degeneration.  [name] presents to the hematology clinic for evaluation of atypical lymphocytes. She is unaccompanied for this visit.   On exam today, Ms. Bains reports chronic fatigue that does affect her ADLs. He has a good appetite but lost approximately 10 lbs a few weeks ago after being diagnosed with pneumonia. Her appetite is slowly improving. She denies nausea, vomiting or abdominal pain. Her bowel habits are unchanged without any recurrent episodes of diarrhea or constipation. She denies easy bruising or signs of active bleeding. She denies fevers, chills, sweats, shortness of breath, chest pain or cough. She has no other complaints. Rest of the 10 point ROS is below.    MEDICAL HISTORY:  Past Medical History:  Diagnosis Date   COPD (chronic obstructive pulmonary disease) (HCC)    Glaucoma    Hypertensive retinopathy    Macular degeneration    Meningioma (HCC)    Rheumatoid arthritis ([place])     SURGICAL HISTORY: Past Surgical History:  Procedure Laterality Date   APPENDECTOMY     CATARACT EXTRACTION     CESAREAN SECTION     x 3   CHOLECYSTECTOMY     CRANIECTOMY /  CRANIOTOMY FOR EXCISION OF BRAIN TUMOR     meningioma   EYE SURGERY     HYSTEROSCOPY WITH D & C     INGUINAL HERNIA REPAIR     YAG LASER APPLICATION      SOCIAL HISTORY: Social History   Socioeconomic History   Marital status: Widowed    Spouse name: Not on file   Number of children: Not on file   Years of education: Not on file   Highest education level: Not on file  Occupational History   Not on file  Tobacco Use   Smoking status: Former    Packs/day: 1.00    Years: 50.00    Total pack years: 50.00    Types: Cigarettes   Smokeless tobacco: Never   Tobacco comments:    Quit smoking 1990's  Vaping Use   Vaping Use: Never used  Substance and Sexual Activity   Alcohol use: Not Currently    Comment: occasional   Drug use: Never   Sexual activity: Not Currently  Other Topics Concern   Not on file  Social History Narrative   Right handed    Social Determinants of Health   Financial Resource Strain: Not on file  Food Insecurity: Not on file  Transportation Needs: Not on file  Physical Activity: Not on file  Stress: Not on file  Social Connections: Not on file  Intimate Partner Violence: Not on file    FAMILY HISTORY: Family History  Problem Relation Age of Onset   Other Mother        died during open heart surgery   Heart disease Father  Lung cancer Maternal Grandfather        smoker   Diabetes Son     ALLERGIES:  is allergic to adhesive [tape] and penicillins.  MEDICATIONS:  Current Outpatient Medications  Medication Sig Dispense Refill   Calcium-Phosphorus-Vitamin D (CITRACAL +D3) [phone] MG-MG-UNIT CHEW See admin instructions.     diclofenac Sodium (VOLTAREN) 1 % GEL Apply topically.     folic acid (FOLVITE) 1 MG tablet Take 1 mg by mouth daily. Take two tablets daily     hydrochlorothiazide (HYDRODIURIL) 25 MG tablet Take 25 mg by mouth daily.     leucovorin (WELLCOVORIN) 5 MG tablet TAKE 2 TABLETS BY MOUTH ONCE A WEEK     levalbuterol  (XOPENEX HFA) 45 MCG/ACT inhaler Inhale 2 puffs into the lungs every 6 (six) hours as needed for wheezing or shortness of breath. 1 each 5   levothyroxine (SYNTHROID) 50 MCG tablet Take 50 mcg by mouth daily before breakfast.     methotrexate (RHEUMATREX) 2.5 MG tablet Take 10 mg by mouth once a week. Caution:Chemotherapy. Protect from light.     Multiple Vitamins-Minerals (PRESERVISION AREDS 2+MULTI VIT) CAPS See admin instructions.     potassium chloride (KLOR-CON) 20 MEQ packet Take 20 mEq by mouth 2 (two) times daily.     primidone (MYSOLINE) 50 MG tablet TAKE 1 TABLET BY MOUTH AT BEDTIME 90 tablet 1   propranolol (INDERAL) 10 MG tablet Take 1 tablet (10 mg total) by mouth 3 (three) times daily. 270 tablet 1   Propylene Glycol (SYSTANE BALANCE OP) Apply to eye.     salsalate (DISALCID) 750 MG tablet Take 2 tablets by mouth 2 (two) times daily.     timolol (BETIMOL) 0.25 % ophthalmic solution Place 1 drop into both eyes 2 (two) times daily.     ALPRAZolam (XANAX) 0.25 MG tablet Take 0.25 mg by mouth at bedtime as needed for anxiety. (Patient not taking: Reported on 11/03/2021)     nicotine polacrilex (COMMIT) 2 MG lozenge Place inside cheek. (Patient not taking: Reported on 04/19/2022)     No current facility-administered medications for this visit.    REVIEW OF SYSTEMS:   Constitutional: ( - ) fevers, ( - )  chills , ( - ) night sweats Eyes: ( - ) blurriness of vision, ( - ) double vision, ( - ) watery eyes Ears, nose, mouth, throat, and face: ( - ) mucositis, ( - ) sore throat Respiratory: ( - ) cough, ( - ) dyspnea, ( - ) wheezes Cardiovascular: ( - ) palpitation, ( - ) chest discomfort, ( - ) lower extremity swelling Gastrointestinal:  ( - ) nausea, ( - ) heartburn, ( - ) change in bowel habits Skin: ( - ) abnormal skin rashes Lymphatics: ( - ) new lymphadenopathy, ( - ) easy bruising Neurological: ( - ) numbness, ( - ) tingling, ( - ) new weaknesses Behavioral/Psych: ( - ) mood  change, ( - ) new changes  All other systems were reviewed with the patient and are negative.  PHYSICAL EXAMINATION: ECOG PERFORMANCE STATUS: 1 - Symptomatic but completely ambulatory  Vitals:   05/10/22 1134  BP: 138/77  Pulse: 82  Resp: 16  Temp: (!) 97.5 F (36.4 C)  SpO2: 96%   Filed Weights   05/10/22 1134  Weight: 141 lb 12.8 oz (64.3 kg)    GENERAL: well appearing female in NAD  SKIN: skin color, texture, turgor are normal, no rashes or significant lesions EYES: conjunctiva are pink and  non-injected, sclera clear OROPHARYNX: no exudate, no erythema; lips, buccal mucosa, and tongue normal  NECK: supple, non-tender LYMPH:  no palpable lymphadenopathy in the cervical, axillary or supraclavicular lymph nodes.  LUNGS: clear to auscultation and percussion with normal breathing effort HEART: regular rate & rhythm and no murmurs and no lower extremity edema ABDOMEN: soft, non-tender, non-distended, normal bowel sounds Musculoskeletal: no cyanosis of digits and no clubbing  PSYCH: alert & oriented x 3, fluent speech NEURO: no focal motor/sensory deficits  LABORATORY DATA:  I have reviewed the data as listed    Latest Ref Rng & Units 05/10/2022   12:30 PM  CBC  WBC 4.0 - 10.5 K/uL 5.1   Hemoglobin 12.0 - 15.0 g/dL 12.8   Hematocrit 36.0 - 46.0 % 37.5   Platelets 150 - 400 K/uL 150        Latest Ref Rng & Units 05/10/2022   12:30 PM  CMP  Glucose 70 - 99 mg/dL 83   BUN 8 - 23 mg/dL 18   Creatinine 0.44 - 1.00 mg/dL 0.87   Sodium 135 - 145 mmol/L 142   Potassium 3.5 - 5.1 mmol/L 3.1   Chloride 98 - 111 mmol/L 100   CO2 22 - 32 mmol/L 31   Calcium 8.9 - 10.3 mg/dL 10.0   Total Protein 6.5 - 8.1 g/dL 6.9   Total Bilirubin 0.3 - 1.2 mg/dL 0.4   Alkaline Phos 38 - 126 U/L 42   AST 15 - 41 U/L 27   ALT 0 - 44 U/L 17     ASSESSMENT & PLAN Arzella Rehmann is a 87 y.o. female who presents to the hematology clinic for evaluation of atypical lymphocytes.  We discussed  the possible etiologies including hematological malignancies, transient elevation from infection, or response to inflammation. Patient has chronic rheumatoid arthritis which is likely the underlying cause. We recommend workup to determine if lymphocytes are malignant in nature. The best test to determine this is flow cytometry, which can help determine if the lymphocytes are a monoclonal population.  Additional workup includes inflammatory workup and detailed history to assure no infectious symptoms.   #Atypical lymphocytes: --Patient has rheumatoid arthritis, which is likely the underlying etiology --labs to include CBC, CMP, LDH, and flow cytometry --additional studies to include ESR and CRP  --no evidence of lymphadenopathy on physical exam. Patient has no history of splenectomy.  --RTC based on above workup.   Orders Placed This Encounter  Procedures   CBC with Differential ([place] Only)    Standing Status:   Future    Number of Occurrences:   1    Standing Expiration Date:   05/11/2023   CMP ([place] only)    Standing Status:   Future    Number of Occurrences:   1    Standing Expiration Date:   05/11/2023   Lactate dehydrogenase (LDH)    Standing Status:   Future    Number of Occurrences:   1    Standing Expiration Date:   05/10/2023   Save Smear for Provider Slide Review    Standing Status:   Future    Number of Occurrences:   1    Standing Expiration Date:   05/11/2023   Sedimentation rate    Standing Status:   Future    Number of Occurrences:   1    Standing Expiration Date:   05/10/2023   C-reactive protein    Standing Status:   Future    Number  of Occurrences:   1    Standing Expiration Date:   05/10/2023   Flow Cytometry, Peripheral Blood (Oncology)    Standing Status:   Future    Number of Occurrences:   1    Standing Expiration Date:   05/11/2023    All questions were answered. The patient knows to call the clinic with any problems, questions or  concerns.  I have spent a total of 60 minutes minutes of face-to-face and non-face-to-face time, preparing to see the patient, obtaining and/or reviewing separately obtained history, performing a medically appropriate examination, counseling and educating the patient, ordering tests/procedures, documenting clinical information in the electronic health record, and care coordination.   Dede Query, PA-C Department of Hematology/Oncology [place] at [facility] Phone: [phone]  Patient was seen with Dr. Lorenso Courier  I have read the above note and personally examined the patient. I agree with the assessment and plan as noted above.  Briefly Mrs. Jamisha Hoeschen is an 87 year old female with medical history significant for rheumatoid arthritis who presents for evaluation of atypical lymphocytes.  The patient recently had blood work done which noted atypical lymphocytes on the differential.  Due to concern for these findings the patient was referred to hematology for further evaluation and management.  Today we discussed possible etiologies for her atypical lymphocytes.  At this time I think the most likely etiology is her methotrexate therapy, though inflammatory conditions can cause atypical lymphocytes.  There are also certain infections and hematological disorders which can cause atypical lymphocytes.  Today we will conduct a full workup to include flow cytometry, ESR, CRP, and review of peripheral blood film.  The patient voiced understanding of the plan moving forward.  If our above workup does not show any concerning abnormalities would recommend continued routine follow-up with her rheumatologist and primary care provider.   Ledell Peoples, MD Department of Hematology/Oncology [place] at [facility] Phone: [phone] Pager: [phone] Email: [email or web address]

## 2022-05-10 ENCOUNTER — Encounter: Payer: Self-pay | Admitting: Physician Assistant

## 2022-05-10 ENCOUNTER — Other Ambulatory Visit: Payer: Self-pay

## 2022-05-10 ENCOUNTER — Inpatient Hospital Stay: Payer: Medicare Other

## 2022-05-10 ENCOUNTER — Inpatient Hospital Stay: Payer: Medicare Other | Attending: Physician Assistant | Admitting: Physician Assistant

## 2022-05-10 VITALS — BP 138/77 | HR 82 | Temp 97.5°F | Resp 16 | Ht 62.0 in | Wt 141.8 lb

## 2022-05-10 DIAGNOSIS — Z79890 Hormone replacement therapy (postmenopausal): Secondary | ICD-10-CM | POA: Diagnosis not present

## 2022-05-10 DIAGNOSIS — H409 Unspecified glaucoma: Secondary | ICD-10-CM | POA: Diagnosis not present

## 2022-05-10 DIAGNOSIS — M069 Rheumatoid arthritis, unspecified: Secondary | ICD-10-CM | POA: Insufficient documentation

## 2022-05-10 DIAGNOSIS — D72820 Lymphocytosis (symptomatic): Secondary | ICD-10-CM

## 2022-05-10 DIAGNOSIS — H35039 Hypertensive retinopathy, unspecified eye: Secondary | ICD-10-CM | POA: Diagnosis not present

## 2022-05-10 DIAGNOSIS — Z87891 Personal history of nicotine dependence: Secondary | ICD-10-CM | POA: Diagnosis not present

## 2022-05-10 DIAGNOSIS — J449 Chronic obstructive pulmonary disease, unspecified: Secondary | ICD-10-CM | POA: Diagnosis not present

## 2022-05-10 DIAGNOSIS — Z79899 Other long term (current) drug therapy: Secondary | ICD-10-CM | POA: Diagnosis not present

## 2022-05-10 DIAGNOSIS — H3530 Unspecified macular degeneration: Secondary | ICD-10-CM | POA: Insufficient documentation

## 2022-05-10 LAB — CBC WITH DIFFERENTIAL (CANCER CENTER ONLY)
Abs Immature Granulocytes: 0.05 10*3/uL (ref 0.00–0.07)
Basophils Absolute: 0.1 10*3/uL (ref 0.0–0.1)
Basophils Relative: 1 %
Eosinophils Absolute: 0 10*3/uL (ref 0.0–0.5)
Eosinophils Relative: 1 %
HCT: 37.5 % (ref 36.0–46.0)
Hemoglobin: 12.8 g/dL (ref 12.0–15.0)
Immature Granulocytes: 1 %
Lymphocytes Relative: 32 %
Lymphs Abs: 1.6 10*3/uL (ref 0.7–4.0)
MCH: 32.4 pg (ref 26.0–34.0)
MCHC: 34.1 g/dL (ref 30.0–36.0)
MCV: 94.9 fL (ref 80.0–100.0)
Monocytes Absolute: 0.7 10*3/uL (ref 0.1–1.0)
Monocytes Relative: 13 %
Neutro Abs: 2.7 10*3/uL (ref 1.7–7.7)
Neutrophils Relative %: 52 %
Platelet Count: 150 10*3/uL (ref 150–400)
RBC: 3.95 MIL/uL (ref 3.87–5.11)
RDW: 16.6 % — ABNORMAL HIGH (ref 11.5–15.5)
WBC Count: 5.1 10*3/uL (ref 4.0–10.5)
nRBC: 0 % (ref 0.0–0.2)

## 2022-05-10 LAB — LACTATE DEHYDROGENASE: LDH: 230 U/L — ABNORMAL HIGH (ref 98–192)

## 2022-05-10 LAB — CMP (CANCER CENTER ONLY)
ALT: 17 U/L (ref 0–44)
AST: 27 U/L (ref 15–41)
Albumin: 4 g/dL (ref 3.5–5.0)
Alkaline Phosphatase: 42 U/L (ref 38–126)
Anion gap: 11 (ref 5–15)
BUN: 18 mg/dL (ref 8–23)
CO2: 31 mmol/L (ref 22–32)
Calcium: 10 mg/dL (ref 8.9–10.3)
Chloride: 100 mmol/L (ref 98–111)
Creatinine: 0.87 mg/dL (ref 0.44–1.00)
GFR, Estimated: >60 mL/min (ref >60)
Glucose, Bld: 83 mg/dL (ref 70–99)
Potassium: 3.1 mmol/L — ABNORMAL LOW (ref 3.5–5.1)
Sodium: 142 mmol/L (ref 135–145)
Total Bilirubin: 0.4 mg/dL (ref 0.3–1.2)
Total Protein: 6.9 g/dL (ref 6.5–8.1)

## 2022-05-10 LAB — C-REACTIVE PROTEIN: CRP: <0.5 mg/dL (ref <1.0)

## 2022-05-10 LAB — SEDIMENTATION RATE: Sed Rate: 10 mm/hr (ref 0–22)

## 2022-05-10 LAB — SAVE SMEAR(SSMR), FOR PROVIDER SLIDE REVIEW

## 2022-05-11 LAB — SURGICAL PATHOLOGY

## 2022-05-13 LAB — FLOW CYTOMETRY

## 2022-05-16 ENCOUNTER — Telehealth: Payer: Self-pay | Admitting: *Deleted

## 2022-05-16 ENCOUNTER — Telehealth: Payer: Self-pay | Admitting: Physician Assistant

## 2022-05-16 ENCOUNTER — Telehealth: Payer: Self-pay | Admitting: Hematology and Oncology

## 2022-05-16 NOTE — Telephone Encounter (Signed)
Received call from patient inquiring about her lab results from 05/10/22  Please advise.

## 2022-05-16 NOTE — Telephone Encounter (Signed)
Called patient to schedule f/u. Patient notified. Mailing reminder.

## 2022-05-16 NOTE — Telephone Encounter (Signed)
I spoke to patient to review the lab results from 05/10/2022. Findings show normal CBC with differential. 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 Patient is not exhibiting B-symptoms and no evidence of lymphocytosis, we can monitor for now. Patient will return in 4 months to repeat labs. She expressed understanding of the plan provided.

## 2022-06-02 ENCOUNTER — Encounter: Payer: Self-pay | Admitting: Nurse Practitioner

## 2022-06-02 ENCOUNTER — Ambulatory Visit (INDEPENDENT_AMBULATORY_CARE_PROVIDER_SITE_OTHER): Payer: Medicare Other | Admitting: Nurse Practitioner

## 2022-06-02 ENCOUNTER — Ambulatory Visit: Payer: Medicare Other

## 2022-06-02 ENCOUNTER — Ambulatory Visit (INDEPENDENT_AMBULATORY_CARE_PROVIDER_SITE_OTHER): Payer: Medicare Other

## 2022-06-02 VITALS — BP 124/72 | HR 70 | Ht 62.0 in | Wt 140.8 lb

## 2022-06-02 DIAGNOSIS — J189 Pneumonia, unspecified organism: Secondary | ICD-10-CM | POA: Diagnosis not present

## 2022-06-02 DIAGNOSIS — J310 Chronic rhinitis: Secondary | ICD-10-CM | POA: Diagnosis not present

## 2022-06-02 DIAGNOSIS — J4489 Other specified chronic obstructive pulmonary disease: Secondary | ICD-10-CM

## 2022-06-02 NOTE — Assessment & Plan Note (Addendum)
Stable. She will continue intranasal steroid and guaifenesin as needed.

## 2022-06-02 NOTE — Progress Notes (Signed)
$'@Patient'l$  ID: Kayla Hurley, female    DOB: Jan 30, 1935, 87 y.o.   MRN: [id number]  Chief Complaint  Patient presents with   Follow-up    Pt f/u she is feeling well despite still having some mucous.     Referring provider: Margretta Sidle, MD  HPI: 87 year old female, former smoker followed for COPD with asthma. She is a patient of Dr. Cordelia Pen and last seen in office on 04/19/2022 by [facility] NP. Past medical history significant for RA on methotrexate, osteoporosis, HTN, hypothyroid, B12 deficiency, essential tremor.   TEST/EVENTS:  11/03/2021 PFT: FVC 82, FEV1 76, ratio 73, DLCOcor 85%.  Positive BD with 17% change 03/31/2022 CXR 2 view: Bronchial wall thickening.  There are reticulonodular opacities at the anterior lungs on the lateral view.  11/03/2021: OV with Dr. Loanne Drilling for follow up. Chronic dry cough. Improved with DayQuil and flonase. No limitation in activity from a respiratory standpoint. During PFT's, she did not like the albuterol as it made her tremor worse. Ok to stop using it. May consider low dose ICS/LABA in the future. Follow up as needed.   04/19/2022: OV with Judeen Geralds NP for follow up. Around 10/26, she developed weakness and a persistent cough, that was occasionally productive with green to yellow sputum. She went to her PCP who ordered a chest xray but she never got the results back from them so she went to urgent care on 10/29. They diagnosed her with pneumonia and treated her with antibiotics. She isn't sure exactly which medication they treated her with but she completed it and has been feeling better. She went back to urgent care on 11/5 for a follow up with repeat x ray and was told that her pneumonia had resolved. Today, she tells me she's been feeling a lot better. Feels like she's building her strength back up. She still has some occasional chest congestion but cough has cleared up and is back to her baseline. She denies any significant shortness of breath. No fevers, chills,  hemoptysis. She was doing well from a respiratory standpoint prior to this; no dyspnea that limits activity. She is not currently on any maintenance inhalers. Hesitant to start anything with her worsening tremor following albuterol during PFTs.   06/02/2022: Today - follow up Patient presents today for follow up. She has been feeling well since she was here last. Back to her baseline. Still has some occasional globus sensations which she relates to postnasal drainage. No increased shortness of breath, cough, wheezing, recent fevers or chills. She never received the levalbuterol from the pharmacy so hasn't tried this. She does still occasionally take mucinex. Uses flonase for nasal symptoms.   Allergies  Allergen Reactions   Adhesive [Tape] Other (See Comments)    blisters   Penicillins Diarrhea, Nausea And Vomiting and Rash     There is no immunization history on file for this patient.  Past Medical History:  Diagnosis Date   COPD (chronic obstructive pulmonary disease) (HCC)    Glaucoma    Hypertensive retinopathy    Macular degeneration    Meningioma (HCC)    Rheumatoid arthritis (HCC)     Tobacco History: Social History   Tobacco Use  Smoking Status Former   Packs/day: 1.00   Years: 50.00   Total pack years: 50.00   Types: Cigarettes  Smokeless Tobacco Never  Tobacco Comments   Quit smoking 1990's   Counseling given: Not Answered Tobacco comments: Quit smoking 1990's   Outpatient Medications Prior to Visit  Medication Sig Dispense Refill   Calcium-Phosphorus-Vitamin D (CITRACAL +D3) [phone] MG-MG-UNIT CHEW See admin instructions.     folic acid (FOLVITE) 1 MG tablet Take 1 mg by mouth daily. Take two tablets daily     hydrochlorothiazide (HYDRODIURIL) 25 MG tablet Take 25 mg by mouth daily.     leucovorin (WELLCOVORIN) 5 MG tablet TAKE 2 TABLETS BY MOUTH ONCE A WEEK     levothyroxine (SYNTHROID) 50 MCG tablet Take 50 mcg by mouth daily before breakfast.      methotrexate (RHEUMATREX) 2.5 MG tablet Take 10 mg by mouth once a week. Caution:Chemotherapy. Protect from light.     Multiple Vitamins-Minerals (PRESERVISION AREDS 2+MULTI VIT) CAPS See admin instructions.     potassium chloride (KLOR-CON) 20 MEQ packet Take 20 mEq by mouth 2 (two) times daily.     primidone (MYSOLINE) 50 MG tablet TAKE 1 TABLET BY MOUTH AT BEDTIME 90 tablet 1   Propylene Glycol (SYSTANE BALANCE OP) Apply to eye.     salsalate (DISALCID) 750 MG tablet Take 2 tablets by mouth 2 (two) times daily.     timolol (BETIMOL) 0.25 % ophthalmic solution Place 1 drop into both eyes 2 (two) times daily.     ALPRAZolam (XANAX) 0.25 MG tablet Take 0.25 mg by mouth at bedtime as needed for anxiety. (Patient not taking: Reported on 11/03/2021)     diclofenac Sodium (VOLTAREN) 1 % GEL Apply topically.     levalbuterol (XOPENEX HFA) 45 MCG/ACT inhaler Inhale 2 puffs into the lungs every 6 (six) hours as needed for wheezing or shortness of breath. (Patient not taking: Reported on 06/02/2022) 1 each 5   nicotine polacrilex (COMMIT) 2 MG lozenge Place inside cheek. (Patient not taking: Reported on 04/19/2022)     propranolol (INDERAL) 10 MG tablet Take 1 tablet (10 mg total) by mouth 3 (three) times daily. (Patient not taking: Reported on 06/02/2022) 270 tablet 1   No facility-administered medications prior to visit.     Review of Systems:   Constitutional: No weight loss or gain, night sweats, fevers, chills, fatigue, lassitude  HEENT: No headaches, difficulty swallowing, tooth/dental problems, or sore throat. No sneezing, itching, ear ache, nasal congestion. +post nasal drip, globus sensation CV:  No chest pain, orthopnea, PND, swelling in lower extremities, anasarca, dizziness, palpitations, syncope Resp: +chronic dry cough. No shortness of breath with exertion or at rest. No productive or non-productive. No hemoptysis. No wheezing.  No chest wall deformity GI:  No heartburn, indigestion,  abdominal pain, nausea, vomiting, diarrhea, change in bowel habits, loss of appetite, bloody stools.  MSK:  No joint pain or swelling.  No decreased range of motion.  No back pain. Neuro: No dizziness or lightheadedness.  Psych: No depression or anxiety. Mood stable.     Physical Exam:  BP 124/72   Pulse 70   Ht '5\' 2"'$  (1.575 m)   Wt 140 lb 12.8 oz (63.9 kg)   SpO2 97%   BMI 25.75 kg/m   GEN: Pleasant, interactive, well-developed; elderly; in no acute distress. HEENT:  Normocephalic and atraumatic. PERRLA. Sclera white. Nasal turbinates pink, moist and patent bilaterally. No rhinorrhea present. Oropharynx pink and moist, without exudate or edema. No lesions, ulcerations, or postnasal drip.  NECK:  Supple w/ fair ROM. No JVD present. Normal carotid impulses w/o bruits. Thyroid symmetrical with no goiter or nodules palpated. No lymphadenopathy.   CV: RRR, no m/r/g, no peripheral edema. Pulses intact, +2 bilaterally. No cyanosis, pallor or clubbing. PULMONARY:  Unlabored,  regular breathing. Diminished bilaterally A&P w/o wheezes/rales/rhonchi. No accessory muscle use.  GI: BS present and normoactive. Soft, non-tender to palpation. No organomegaly or masses detected.  MSK: No erythema, warmth or tenderness. Cap refil <2 sec all extrem. No deformities or joint swelling noted.  Neuro: A/Ox3. No focal deficits noted.   Skin: Warm, no lesions or rashe Psych: Normal affect and behavior. Judgement and thought content appropriate.     Lab Results:  CBC    Component Value Date/Time   WBC 5.1 05/10/2022 1230   RBC 3.95 05/10/2022 1230   HGB 12.8 05/10/2022 1230   HCT 37.5 05/10/2022 1230   PLT 150 05/10/2022 1230   MCV 94.9 05/10/2022 1230   MCH 32.4 05/10/2022 1230   MCHC 34.1 05/10/2022 1230   RDW 16.6 (H) 05/10/2022 1230   LYMPHSABS 1.6 05/10/2022 1230   MONOABS 0.7 05/10/2022 1230   EOSABS 0.0 05/10/2022 1230   BASOSABS 0.1 05/10/2022 1230    BMET    Component Value  Date/Time   NA 142 05/10/2022 1230   K 3.1 (L) 05/10/2022 1230   CL 100 05/10/2022 1230   CO2 31 05/10/2022 1230   GLUCOSE 83 05/10/2022 1230   BUN 18 05/10/2022 1230   CREATININE 0.87 05/10/2022 1230   CALCIUM 10.0 05/10/2022 1230   GFRNONAA >60 05/10/2022 1230    BNP No results found for: "BNP"   Imaging:  No results found.       Latest Ref Rng & Units 11/03/2021   12:03 PM  PFT Results  FVC-Pre L 1.86   FVC-Predicted Pre % 82   FVC-Post L 2.03   FVC-Predicted Post % 89   Pre FEV1/FVC % % 68   Post FEV1/FCV % % 73   FEV1-Pre L 1.26   FEV1-Predicted Pre % 76   FEV1-Post L 1.48   DLCO uncorrected ml/min/mmHg 15.02   DLCO UNC% % 85   DLCO corrected ml/min/mmHg 15.02   DLCO COR %Predicted % 85   DLVA Predicted % 95     No results found for: "NITRICOXIDE"      Assessment & Plan:   CAP (community acquired pneumonia) Treated for CAP in November 2023. Completed abx around 11/5. She is clinically improved. Repeat imaging today to ensure resolution.   Patient Instructions  Continue levalbuterol (Xopenex) 1-2 puffs every 6 hours as needed for shortness of breath or wheezing. Follow up with your pharmacy on why you didn't receive this Continue flutter valve as needed for chest congestion  Continue mucinex (guaifenesin) 600 mg Twice daily for chest congestion  Chest x ray today to ensure pneumonia has cleared  Follow up in 6 months with Dr. Loanne Drilling or sooner, if needed    COPD with asthma ([place]) COPD with asthma; mild obstruction. She has a very low symptom burden at baseline. She is hesitant regarding inhalers. Prescribed Xopenex at last OV, as she was willing to trial it; never received this from her pharmacy. She is going to follow up with them regarding this. Encouraged her to remain active, as tolerated.   Chronic rhinitis Stable. She will continue intranasal steroid and guaifenesin as needed.     I spent 28 minutes of dedicated to the care of this  patient on the date of this encounter to include pre-visit review of records, face-to-face time with the patient discussing conditions above, post visit ordering of testing, clinical documentation with the electronic health record, making appropriate referrals as documented, and communicating necessary findings to members of the  patients care team.  Clayton Bibles, NP 06/02/2022  Pt aware and understands NP's role.

## 2022-06-02 NOTE — Assessment & Plan Note (Addendum)
COPD with asthma; mild obstruction. She has a very low symptom burden at baseline. She is hesitant regarding inhalers. Prescribed Xopenex at last OV, as she was willing to trial it; never received this from her pharmacy. She is going to follow up with them regarding this. Encouraged her to remain active, as tolerated.

## 2022-06-02 NOTE — Patient Instructions (Addendum)
Continue levalbuterol (Xopenex) 1-2 puffs every 6 hours as needed for shortness of breath or wheezing. Follow up with your pharmacy on why you didn't receive this Continue flutter valve as needed for chest congestion  Continue mucinex (guaifenesin) 600 mg Twice daily for chest congestion  Chest x ray today to ensure pneumonia has cleared  Follow up in 6 months with Dr. Loanne Drilling or sooner, if needed

## 2022-06-02 NOTE — Assessment & Plan Note (Signed)
Treated for CAP in November 2023. Completed abx around 11/5. She is clinically improved. Repeat imaging today to ensure resolution.   Patient Instructions  Continue levalbuterol (Xopenex) 1-2 puffs every 6 hours as needed for shortness of breath or wheezing. Follow up with your pharmacy on why you didn't receive this Continue flutter valve as needed for chest congestion  Continue mucinex (guaifenesin) 600 mg Twice daily for chest congestion  Chest x ray today to ensure pneumonia has cleared  Follow up in 6 months with Dr. Loanne Drilling or sooner, if needed

## 2022-06-08 NOTE — Progress Notes (Signed)
Improved CXR, indicating resolved pna. Thanks.

## 2022-06-17 NOTE — Progress Notes (Signed)
[facility] Note  06/20/2022     CHIEF COMPLAINT Patient presents for Retina Follow Up  HISTORY OF PRESENT ILLNESS: Kayla Hurley is a 88 y.o. female who presents to the clinic today for:   HPI     Retina Follow Up   Patient presents with  Wet AMD.  In right eye.  This started 10 weeks ago.  I, the attending physician,  performed the HPI with the patient and updated documentation appropriately.        Comments   Patient here for 10 weeks for retina follow up for exu ARMD OD. Patient states vision when in bright light then goes into a darker part of house harder to see. No eye pain. Uses shampoo and warm cloth. Uses glaucoma drops and AT's refresh.      Last edited by Bernarda Caffey, MD on 06/22/2022  8:44 PM.     Pt states vision is stable, she is taking AREDS 2  Referring physician: Margretta Sidle, [place],  [place] [address]  HISTORICAL INFORMATION:   Selected notes from the MEDICAL RECORD NUMBER Referred by Dr. Kathlen Mody for concern of exu ARMD OD   CURRENT MEDICATIONS: Current Outpatient Medications (Ophthalmic Drugs)  Medication Sig   Propylene Glycol (SYSTANE BALANCE OP) Apply to eye.   timolol (BETIMOL) 0.25 % ophthalmic solution Place 1 drop into both eyes 2 (two) times daily.   No current facility-administered medications for this visit. (Ophthalmic Drugs)   Current Outpatient Medications (Other)  Medication Sig   Calcium-Phosphorus-Vitamin D (CITRACAL +D3) [phone] MG-MG-UNIT CHEW See admin instructions.   diclofenac Sodium (VOLTAREN) 1 % GEL Apply topically.   folic acid (FOLVITE) 1 MG tablet Take 1 mg by mouth daily. Take two tablets daily   hydrochlorothiazide (HYDRODIURIL) 25 MG tablet Take 25 mg by mouth daily.   leucovorin (WELLCOVORIN) 5 MG tablet TAKE 2 TABLETS BY MOUTH ONCE A WEEK   levothyroxine (SYNTHROID) 50 MCG tablet Take 50 mcg by mouth daily before breakfast.   methotrexate (RHEUMATREX)  2.5 MG tablet Take 10 mg by mouth once a week. Caution:Chemotherapy. Protect from light.   Multiple Vitamins-Minerals (PRESERVISION AREDS 2+MULTI VIT) CAPS See admin instructions.   potassium chloride (KLOR-CON) 20 MEQ packet Take 20 mEq by mouth 2 (two) times daily.   primidone (MYSOLINE) 50 MG tablet TAKE 1 TABLET BY MOUTH AT BEDTIME   salsalate (DISALCID) 750 MG tablet Take 2 tablets by mouth 2 (two) times daily.   ALPRAZolam (XANAX) 0.25 MG tablet Take 0.25 mg by mouth at bedtime as needed for anxiety. (Patient not taking: Reported on 11/03/2021)   levalbuterol (XOPENEX HFA) 45 MCG/ACT inhaler Inhale 2 puffs into the lungs every 6 (six) hours as needed for wheezing or shortness of breath. (Patient not taking: Reported on 06/20/2022)   nicotine polacrilex (COMMIT) 2 MG lozenge Place inside cheek. (Patient not taking: Reported on 04/19/2022)   propranolol (INDERAL) 10 MG tablet Take 1 tablet (10 mg total) by mouth 3 (three) times daily. (Patient not taking: Reported on 06/02/2022)   No current facility-administered medications for this visit. (Other)   REVIEW OF SYSTEMS: ROS   Positive for: Neurological, Musculoskeletal, Eyes, Respiratory Negative for: Constitutional, Gastrointestinal, Skin, Genitourinary, HENT, Endocrine, Cardiovascular, Psychiatric, Allergic/Imm, Heme/Lymph Last edited by Theodore Demark, COA on 06/20/2022  9:53 AM.      ALLERGIES Allergies  Allergen Reactions   Adhesive [Tape] Other (See Comments)    blisters   Penicillins Diarrhea, Nausea  And Vomiting and Rash   PAST MEDICAL HISTORY Past Medical History:  Diagnosis Date   COPD (chronic obstructive pulmonary disease) (HCC)    Glaucoma    Hypertensive retinopathy    Macular degeneration    Meningioma (HCC)    Rheumatoid arthritis (HCC)    Past Surgical History:  Procedure Laterality Date   APPENDECTOMY     CATARACT EXTRACTION     CESAREAN SECTION     x 3   CHOLECYSTECTOMY     CRANIECTOMY / CRANIOTOMY  FOR EXCISION OF BRAIN TUMOR     meningioma   EYE SURGERY     HYSTEROSCOPY WITH D & C     INGUINAL HERNIA REPAIR     YAG LASER APPLICATION     FAMILY HISTORY Family History  Problem Relation Age of Onset   Other Mother        died during open heart surgery   Heart disease Father    Lung cancer Maternal Grandfather        smoker   Diabetes Son    SOCIAL HISTORY Social History   Tobacco Use   Smoking status: Former    Packs/day: 1.00    Years: 50.00    Total pack years: 50.00    Types: Cigarettes   Smokeless tobacco: Never   Tobacco comments:    Quit smoking 1990's  Vaping Use   Vaping Use: Never used  Substance Use Topics   Alcohol use: Not Currently    Comment: occasional   Drug use: Never       OPHTHALMIC EXAM:  Base Eye Exam     Visual Acuity (Snellen - Linear)       Right Left   Dist cc 20/20 20/60   Dist ph cc  NI    Correction: Glasses         Tonometry (Tonopen, 9:51 AM)       Right Left   Pressure 19 20         Pupils       Dark Light Shape React APD   Right 2 1 Round Brisk None   Left 2 1 Round Brisk None         Visual Fields (Counting fingers)       Left Right    Full Full         Extraocular Movement       Right Left    Full, Ortho Full, Ortho         Neuro/Psych     Oriented x3: Yes   Mood/Affect: Normal         Dilation     Both eyes: 1.0% Mydriacyl, 2.5% Phenylephrine @ 9:51 AM           Slit Lamp and Fundus Exam     Slit Lamp Exam       Right Left   Lids/Lashes Dermato Dermato, mild MGD   Conjunctiva/Sclera White and quiet White and quiet   Cornea Mild arcus, 1-2+PEE, mild tear film debris 3+ inferior PEE, arcus   Anterior Chamber Deep and quiet Deep and quiet   Iris Round and dilated Round and dilated   Lens PCIOL in excellent position, open pc PCIOL in excellent position   Anterior Vitreous Syneresis, silicone oil micro bubbles syneresis         Fundus Exam       Right Left   Disc  Pink, sharp, +cupping and central pallor, mild temporal PPA Pink and sharp, +cupping  C/D Ratio 0.7 0.6   Macula Blunted foveal reflex, drusen, RPE mottling, clumping and atrophy, +CNV with stable improvement in shallow SRF/edema, no frank heme, +GA Flat, blunted foveal reflex, Drusen, RPE mottling, clumping and atrophy, +GA superior and nasal to fovea, No heme or edema   Vessels attenuated, Tortuous attenuated, Tortuous   Periphery Attached, mild peripheral drusen, mild reticular degeneration, focal paving stone inferiorly, no heme Attached, mild peripheral drusen and mild reticular degeneration, paving stone inferiorly           Refraction     Wearing Rx       Sphere Cylinder Axis Add   Right -0.50 +1.75 010 +2.50   Left -2.50 +2.00 175 +2.50           IMAGING AND PROCEDURES  Imaging and Procedures for 06/20/2022  OCT, Retina - OU - Both Eyes       Right Eye Quality was good. Central Foveal Thickness: 303. Progression has been stable. Findings include normal foveal contour, no IRF, no SRF, retinal drusen , pigment epithelial detachment, outer retinal atrophy (Stable improvement in [facility] and edema overlying PED IT fovea, mild interval progression of GA on en face image).   Left Eye Quality was good. Central Foveal Thickness: 270. Progression has been stable. Findings include normal foveal contour, no IRF, no SRF, retinal drusen , outer retinal atrophy (Mild progression of GA on en face imaging).   Notes *Images captured and stored on drive  Diagnosis / Impression:  OD: exudative ARMD -- Stable improvement in [facility] and edema overlying PED IT fovea, mild interval progression of GA on en face image OS: nonexudative ARMD;  NFP, no IRF/SRF -- Mild progression of GA on en face imaging  Clinical management:  See below  Abbreviations: NFP - Normal foveal profile. CME - cystoid macular edema. PED - pigment epithelial detachment. IRF - intraretinal fluid. SRF - subretinal fluid. EZ  - ellipsoid zone. ERM - epiretinal membrane. ORA - outer retinal atrophy. ORT - outer retinal tubulation. SRHM - subretinal hyper-reflective material. IRHM - intraretinal hyper-reflective material            ASSESSMENT/PLAN:    ICD-10-CM   1. Exudative age-related macular degeneration of right eye with active choroidal neovascularization (HCC)  H35.3211 OCT, Retina - OU - Both Eyes    2. Intermediate stage nonexudative age-related macular degeneration of left eye  H35.3122     3. Amblyopia of eye, left  H53.002     4. Primary open angle glaucoma (POAG) of both eyes, moderate stage  H40.1132     5. Essential hypertension  I10     6. Hypertensive retinopathy of both eyes  H35.033     7. Pseudophakia of both eyes  Z96.1     8. Dry eyes, bilateral  H04.123      1. 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             - s/p IVA OD # 1 (07.29.21), #2 (08.30.21), #3 (09.27.21), #4 (11.02.21), #5 (12.13.21), #6 (01.31.22), #7 (03.24.22), #8 (09.26.22), #9 (11.14.22), #10 (01.03.23), #11 (02.27.23), #12 (05.08.23), #13 (07.31.23)  - excellent initial response and maintenance  - BCVA 20/20 - stable  - OCT shows stable improvement in [facility] and edema overlying PED IT fovea, mild interval progression of GA on en face image at 5.5 mos since last IVA OD  -  discussed findings and treatment options   - recommend holding IVA today with follow up in 3 mos  - pt in agreement - [facility] informed consent obtained, signed and scanned 05.08.23  - f/u in , sooner prn -- DFE, OCT  2. Age related macular degeneration, non-exudative, OS  - The incidence, anatomy, and pathology of dry AMD, risk of progression, and the AREDS and AREDS 2 studies including smoking risks discussed with patient.  - recommend Amsler grid monitoring  3. Amblyopia OS  - long standing  - baseline BCVA ~20/50-60  - monitor  4. POAG  OU  - was under the expert management of Dr. Kathlen Mody  - on timolol BID OU per Dr. Kathlen Mody  - IOP 19,20  5,6. Hypertensive retinopathy OU - discussed importance of tight BP control - monitor  7. Pseudophakia OU  - s/p CE/IOL  - IOL in good position, doing well  - monitor  8. Dry eyes OU (OD>OS)  - previously managed by Dr. Kathlen Mody   Ophthalmic Meds Ordered this visit:  No orders of the defined types were placed in this encounter.    Return in about 3 months (around 09/19/2022) for exu ARMD OD, DFE, OCT.  There are no Patient Instructions on file for this visit.  This document serves as a record of services personally performed by Gardiner Sleeper, MD, PhD. It was created on their behalf by Roselee Nova, COMT. The creation of this record is the provider's dictation and/or activities during the visit.  Electronically signed by: Roselee Nova, COMT 06/22/22 8:46 PM  This document serves as a record of services personally performed by Gardiner Sleeper, MD, PhD. It was created on their behalf by San Jetty. Owens Shark, OA an ophthalmic technician. The creation of this record is the provider's dictation and/or activities during the visit.    Electronically signed by: San Jetty. Owens Shark, [place] 01.15.2024 8:46 PM  Gardiner Sleeper, M.D., Ph.D. Diseases & Surgery of the Retina and Vitreous [facility] [place]  I have reviewed the above documentation for accuracy and completeness, and I agree with the above. Gardiner Sleeper, M.D., Ph.D. 06/22/22 8:49 PM  Abbreviations: M myopia (nearsighted); A astigmatism; H hyperopia (farsighted); P presbyopia; Mrx spectacle prescription;  CTL contact lenses; OD right eye; OS left eye; OU both eyes  XT exotropia; ET esotropia; PEK punctate epithelial keratitis; PEE punctate epithelial erosions; DES dry eye syndrome; MGD meibomian gland dysfunction; ATs artificial tears; PFAT's preservative free artificial tears; [place] nuclear sclerotic cataract; PSC posterior  subcapsular cataract; ERM epi-retinal membrane; PVD posterior vitreous detachment; RD retinal detachment; DM diabetes mellitus; DR diabetic retinopathy; NPDR non-proliferative diabetic retinopathy; PDR proliferative diabetic retinopathy; CSME clinically significant macular edema; DME diabetic macular edema; dbh dot blot hemorrhages; CWS cotton wool spot; POAG primary open angle glaucoma; C/D cup-to-disc ratio; HVF humphrey visual field; GVF goldmann visual field; OCT optical coherence tomography; IOP intraocular pressure; BRVO Branch retinal vein occlusion; CRVO central retinal vein occlusion; CRAO central retinal artery occlusion; BRAO branch retinal artery occlusion; RT retinal tear; SB scleral buckle; PPV pars plana vitrectomy; VH Vitreous hemorrhage; PRP panretinal laser photocoagulation; IVK intravitreal kenalog; VMT vitreomacular traction; MH Macular hole;  NVD neovascularization of the disc; NVE neovascularization elsewhere; AREDS age related eye disease study; ARMD age related macular degeneration; POAG primary open angle glaucoma; EBMD epithelial/anterior basement membrane dystrophy; ACIOL anterior chamber intraocular lens; IOL intraocular lens; PCIOL posterior chamber intraocular lens; Phaco/IOL phacoemulsification with intraocular lens placement; [place] photorefractive keratectomy;  LASIK laser assisted in situ keratomileusis; HTN hypertension; DM diabetes mellitus; COPD chronic obstructive pulmonary disease

## 2022-06-20 ENCOUNTER — Ambulatory Visit (INDEPENDENT_AMBULATORY_CARE_PROVIDER_SITE_OTHER): Payer: Medicare Other | Admitting: Ophthalmology

## 2022-06-20 ENCOUNTER — Encounter (INDEPENDENT_AMBULATORY_CARE_PROVIDER_SITE_OTHER): Payer: Self-pay | Admitting: Ophthalmology

## 2022-06-20 DIAGNOSIS — H401132 Primary open-angle glaucoma, bilateral, moderate stage: Secondary | ICD-10-CM

## 2022-06-20 DIAGNOSIS — Z961 Presence of intraocular lens: Secondary | ICD-10-CM

## 2022-06-20 DIAGNOSIS — I10 Essential (primary) hypertension: Secondary | ICD-10-CM | POA: Diagnosis not present

## 2022-06-20 DIAGNOSIS — H353211 Exudative age-related macular degeneration, right eye, with active choroidal neovascularization: Secondary | ICD-10-CM | POA: Diagnosis not present

## 2022-06-20 DIAGNOSIS — H353122 Nonexudative age-related macular degeneration, left eye, intermediate dry stage: Secondary | ICD-10-CM

## 2022-06-20 DIAGNOSIS — H35033 Hypertensive retinopathy, bilateral: Secondary | ICD-10-CM

## 2022-06-20 DIAGNOSIS — H53002 Unspecified amblyopia, left eye: Secondary | ICD-10-CM

## 2022-06-20 DIAGNOSIS — H04123 Dry eye syndrome of bilateral lacrimal glands: Secondary | ICD-10-CM

## 2022-06-22 ENCOUNTER — Encounter (INDEPENDENT_AMBULATORY_CARE_PROVIDER_SITE_OTHER): Payer: Self-pay | Admitting: Ophthalmology

## 2022-07-20 ENCOUNTER — Ambulatory Visit: Payer: Medicare Other | Admitting: Neurology

## 2022-07-24 ENCOUNTER — Other Ambulatory Visit: Payer: Self-pay | Admitting: Neurology

## 2022-07-27 ENCOUNTER — Telehealth: Payer: Self-pay | Admitting: Neurology

## 2022-07-27 NOTE — Telephone Encounter (Signed)
Called patient to ask about these symptoms and spoke to her about her upcoming appointment. Patient  did communicate with me about the Dat Scan and that she has a Robo killer that ignores phone calls if she doesn't know the number. She doesn't recall receiving a phone call regarding this test. I have reached out to [facility] [employer] to see if I can resubmit the paperwork or if I have to wait until the next appointment with this patient

## 2022-07-27 NOTE — Telephone Encounter (Signed)
Patient was called because  we had to move her appt from 08-03-22 and she was offered some  dates that did not work for her so we got her moved to 08-12-22 and is on the wait list   While on the phone with her she wanted me to let Dr Tat know that the primidone makes her have Brain fog when she takes it and when she does  not take her tremors are really back. She has to drink her coffee out of a metal straw when she does not take it. She lives alone and can not have Brain fog she makes bad choices when she has the brain fog. She wants to speak to some one about the propranolol if she was to take 2 one it mess with her blood pressure.  Please call

## 2022-07-28 NOTE — Telephone Encounter (Signed)
Called and left patient detailed message letting her know Dr. Carles Collet would like to wait until appointment to schedule Dat Scan

## 2022-08-03 ENCOUNTER — Ambulatory Visit: Payer: Medicare Other | Admitting: Neurology

## 2022-08-03 NOTE — Progress Notes (Unsigned)
Assessment/Plan:    1.  Essential Tremor  -Patient will continue on primidone, 50 mg, half tablet nightly.  She thinks that it does help even at this tiny dose.  She, however, has cognitive side effects even at this dose and does not take it when she needs to be fully cognitively aware.  She continues to have fairly significant tremor, however.   -Patient discontinued her propranolol due to low blood pressure.  She did not tolerate extremely low-dose topiramate, although I am not really sure that the side effects she reported (GI upset with 25 mg) was really related to the topiramate, as she only took it for 2 days `-she isn't interested in focused ultrasound or DBS surgery due to age, which is understandable. -I have explored other options with her.   She does not want Lyrica.  She is not interested in [place] because of the cost. -After long discussion today, she was agreeable to low-dose gabapentin, 100 mg nightly. -She asks about low dose xanax, stating that someone gave it to her for anxiety years ago.  I told her that I would be very leery that it would also cause fairly significant cognitive change and she would be at risk for falls.  She really does not want any of those things, understandably.  She was agreeable to trying the gabapentin first, and if it did not help, I told her that we could really think the Xanax, but I really was not that keen on it. -discussed botox trial for ET.  This likely would be very helpful but would require a trial to [place] but would require a drive.  She states that she just cannot drive there. -We had tried to schedule a DaTscan, but they were not able to reach her when called.  We only did this because she had a bit of rest tremor, but she does not really meet any criteria for Parkinson's otherwise, so we decided to go ahead and hold on that today.  Subjective:   Kayla Hurley was seen today in follow up for essential tremor.  My previous records were  reviewed prior to todays visit.  When patient was last seen, I ordered a DaTscan on her, but she did not end up getting that completed.  She called [place] recently (about a week ago) stating that if she goes up on the primidone she has "brain fog" but if she stops that she has more tremor.  She had told me last visit she had stopped the propranolol, but when she called she stated that she was still taking it, but only once per day.  She reports today that she is only able to get up to primidone '50mg'$ , 1/2 at bedtime but if she wants to be clear headed she doesn't take it at all.    Current prescribed movement disorder medications: Primidone, 50 mg, 1/2 at bedtime Propranolol, 10 mg 1 tablet (not on this)   Previously tried tremor medications:  propranolol, 10 mg 3 times per day (BP low); xanax helped it; Topamax, 25 mg twice daily (patient took it for 2 days and felt that it upset her stomach and stopped it)    ALLERGIES:   Allergies  Allergen Reactions   Adhesive [Tape] Other (See Comments)    blisters   Penicillins Diarrhea, Nausea And Vomiting and Rash    CURRENT MEDICATIONS:  Outpatient Encounter Medications as of 08/04/2022  Medication Sig   gabapentin (NEURONTIN) 100 MG capsule Take 1 capsule (  100 mg total) by mouth at bedtime.   ALPRAZolam (XANAX) 0.25 MG tablet Take 0.25 mg by mouth at bedtime as needed for anxiety. (Patient not taking: Reported on 11/03/2021)   Calcium-Phosphorus-Vitamin D (CITRACAL +D3) [id number] MG-MG-UNIT CHEW See admin instructions.   diclofenac Sodium (VOLTAREN) 1 % GEL Apply topically.   folic acid (FOLVITE) 1 MG tablet Take 1 mg by mouth daily. Take two tablets daily   hydrochlorothiazide (HYDRODIURIL) 25 MG tablet Take 25 mg by mouth daily.   leucovorin (WELLCOVORIN) 5 MG tablet TAKE 2 TABLETS BY MOUTH ONCE A WEEK   levalbuterol (XOPENEX HFA) 45 MCG/ACT inhaler Inhale 2 puffs into the lungs every 6 (six) hours as needed for wheezing or shortness of breath.  (Patient not taking: Reported on 06/20/2022)   levothyroxine (SYNTHROID) 50 MCG tablet Take 50 mcg by mouth daily before breakfast.   methotrexate (RHEUMATREX) 2.5 MG tablet Take 10 mg by mouth once a week. Caution:Chemotherapy. Protect from light.   Multiple Vitamins-Minerals (PRESERVISION AREDS 2+MULTI VIT) CAPS See admin instructions.   nicotine polacrilex (COMMIT) 2 MG lozenge Place inside cheek. (Patient not taking: Reported on 04/19/2022)   potassium chloride (KLOR-CON) 20 MEQ packet Take 20 mEq by mouth 2 (two) times daily.   primidone (MYSOLINE) 50 MG tablet TAKE 1 TABLET BY MOUTH AT BEDTIME   propranolol (INDERAL) 10 MG tablet Take 1 tablet (10 mg total) by mouth 3 (three) times daily. (Patient not taking: Reported on 06/02/2022)   Propylene Glycol (SYSTANE BALANCE OP) Apply to eye.   salsalate (DISALCID) 750 MG tablet Take 2 tablets by mouth 2 (two) times daily.   timolol (BETIMOL) 0.25 % ophthalmic solution Place 1 drop into both eyes 2 (two) times daily.   No facility-administered encounter medications on file as of 08/04/2022.     Objective:    PHYSICAL EXAMINATION:    VITALS:   Vitals:   08/04/22 0901  BP: 126/72  Pulse: 61  SpO2: 95%  Weight: 139 lb 11.2 oz (63.4 kg)  Height: '5\' 2"'$  (1.575 m)      GEN:  The patient appears stated age and is in NAD. HEENT:  Normocephalic, atraumatic.  The mucous membranes are moist. The superficial temporal arteries are without ropiness or tenderness. CV:  RRR Lungs:  CTAB Neck/HEME:  There are no carotid bruits bilaterally.  Neurological examination:  Orientation: The patient is alert and oriented x3. Cranial nerves: There is good facial symmetry. The speech is fluent and clear. Soft palate rises symmetrically and there is no tongue deviation. Hearing is intact to conversational tone. Sensation: Sensation is intact to light touch throughout Motor: Strength is at least antigravity x4.  Movement examination: Tone: There is  normal tone in the UE/LE Abnormal movements: there is LUE rest tremor intermittently and with ambulation.  No postural tremor.  Abe People does not have a significant degree of intention tremor.  Has significant archimedes spirals trouble on the L and right today, which is worse than last visit. Coordination:  There is no decremation with RAM's, with any form of RAMS, including alternating supination and pronation of the forearm, hand opening and closing, finger taps, heel taps and toe taps. Gait and Station: The patient has no difficulty arising out of a deep-seated chair without the use of the hands. The patient ambulates well but has LUE tremor    Total time spent on today's visit was 32 minutes, including both face-to-face time and nonface-to-face time.  Time included that spent on review of records (prior  notes available to me/labs/imaging if pertinent), discussing treatment and goals, answering patient's questions and coordinating care.  Cc:  Pcp, No

## 2022-08-04 ENCOUNTER — Ambulatory Visit (INDEPENDENT_AMBULATORY_CARE_PROVIDER_SITE_OTHER): Payer: Medicare Other | Admitting: Neurology

## 2022-08-04 ENCOUNTER — Encounter: Payer: Self-pay | Admitting: Neurology

## 2022-08-04 VITALS — BP 126/72 | HR 61 | Ht 62.0 in | Wt 139.7 lb

## 2022-08-04 DIAGNOSIS — G250 Essential tremor: Secondary | ICD-10-CM | POA: Diagnosis not present

## 2022-08-04 MED ORDER — GABAPENTIN 100 MG PO CAPS: 100.0000 mg | ORAL_CAPSULE | Freq: Every day | ORAL | 1 refills | Status: DC

## 2022-08-04 NOTE — Patient Instructions (Signed)
Start gabapentin 100 mg at bedtime Continue primidone 50 mg, 1/2 at bedtime STOP propranolol I don't want to do xanax right now due to risk of falls but I MAY consider that if the gabapentin doesn't work or has side effects.

## 2022-08-08 ENCOUNTER — Telehealth: Payer: Self-pay | Admitting: Neurology

## 2022-08-08 NOTE — Telephone Encounter (Signed)
Pt called in stating she has not taken the gabapentin because she did some research through the [place] [facility] and found that there are adverse reactions that can happen when taking Primidone and gabapentin together. She lives alone and doesn't need the headache of bad side effects.

## 2022-08-09 NOTE — Telephone Encounter (Signed)
Called patient and left a message for a call back.  

## 2022-08-09 NOTE — Telephone Encounter (Signed)
Patient returned phone call. °

## 2022-08-10 NOTE — Telephone Encounter (Signed)
Called and spoke to patient. Informed her of Dr. Doristine Devoid response. Patient stated that based off of the [facility] information she does not want to take the medication because there is so many side effects. So she has decided to not take the medication. Patient stated that she has COPD and Asthma and wants to make sure Dr. Carles Collet is aware of this.

## 2022-08-12 ENCOUNTER — Ambulatory Visit: Payer: PRIVATE HEALTH INSURANCE | Admitting: Neurology

## 2022-08-12 ENCOUNTER — Ambulatory Visit: Payer: Medicare Other | Admitting: Neurology

## 2022-09-06 ENCOUNTER — Emergency Department (HOSPITAL_COMMUNITY): Payer: Medicare Other

## 2022-09-06 ENCOUNTER — Observation Stay (HOSPITAL_BASED_OUTPATIENT_CLINIC_OR_DEPARTMENT_OTHER)
Admission: EM | Admit: 2022-09-06 | Discharge: 2022-09-07 | Disposition: A | Discharge status: Home Health | Payer: Medicare Other | Source: Home / Self Care | Attending: Emergency Medicine | Admitting: Emergency Medicine

## 2022-09-06 ENCOUNTER — Encounter (HOSPITAL_COMMUNITY): Payer: Self-pay

## 2022-09-06 ENCOUNTER — Other Ambulatory Visit: Payer: Self-pay

## 2022-09-06 DIAGNOSIS — J9600 Acute respiratory failure, unspecified whether with hypoxia or hypercapnia: Secondary | ICD-10-CM

## 2022-09-06 DIAGNOSIS — G250 Essential tremor: Secondary | ICD-10-CM | POA: Diagnosis present

## 2022-09-06 DIAGNOSIS — J189 Pneumonia, unspecified organism: Secondary | ICD-10-CM | POA: Insufficient documentation

## 2022-09-06 DIAGNOSIS — J449 Chronic obstructive pulmonary disease, unspecified: Secondary | ICD-10-CM | POA: Insufficient documentation

## 2022-09-06 DIAGNOSIS — R6520 Severe sepsis without septic shock: Secondary | ICD-10-CM

## 2022-09-06 DIAGNOSIS — D696 Thrombocytopenia, unspecified: Secondary | ICD-10-CM

## 2022-09-06 DIAGNOSIS — M25552 Pain in left hip: Secondary | ICD-10-CM | POA: Insufficient documentation

## 2022-09-06 DIAGNOSIS — I10 Essential (primary) hypertension: Secondary | ICD-10-CM | POA: Insufficient documentation

## 2022-09-06 DIAGNOSIS — Z87891 Personal history of nicotine dependence: Secondary | ICD-10-CM | POA: Insufficient documentation

## 2022-09-06 DIAGNOSIS — U071 COVID-19: Secondary | ICD-10-CM | POA: Diagnosis present

## 2022-09-06 DIAGNOSIS — M169 Osteoarthritis of hip, unspecified: Secondary | ICD-10-CM | POA: Insufficient documentation

## 2022-09-06 DIAGNOSIS — R059 Cough, unspecified: Secondary | ICD-10-CM | POA: Diagnosis not present

## 2022-09-06 DIAGNOSIS — E039 Hypothyroidism, unspecified: Secondary | ICD-10-CM | POA: Insufficient documentation

## 2022-09-06 DIAGNOSIS — J9601 Acute respiratory failure with hypoxia: Secondary | ICD-10-CM | POA: Diagnosis present

## 2022-09-06 DIAGNOSIS — Z79899 Other long term (current) drug therapy: Secondary | ICD-10-CM | POA: Insufficient documentation

## 2022-09-06 DIAGNOSIS — J4489 Other specified chronic obstructive pulmonary disease: Secondary | ICD-10-CM | POA: Diagnosis present

## 2022-09-06 DIAGNOSIS — W19XXXA Unspecified fall, initial encounter: Secondary | ICD-10-CM | POA: Insufficient documentation

## 2022-09-06 DIAGNOSIS — M160 Bilateral primary osteoarthritis of hip: Secondary | ICD-10-CM

## 2022-09-06 LAB — COMPREHENSIVE METABOLIC PANEL
ALT: 19 U/L (ref 0–44)
AST: 45 U/L — ABNORMAL HIGH (ref 15–41)
Albumin: 3.3 g/dL — ABNORMAL LOW (ref 3.5–5.0)
Alkaline Phosphatase: 37 U/L — ABNORMAL LOW (ref 38–126)
Anion gap: 9 (ref 5–15)
BUN: 8 mg/dL (ref 8–23)
CO2: 28 mmol/L (ref 22–32)
Calcium: 8.9 mg/dL (ref 8.9–10.3)
Chloride: 99 mmol/L (ref 98–111)
Creatinine, Ser: 0.82 mg/dL (ref 0.44–1.00)
GFR, Estimated: >60 mL/min (ref >60)
Glucose, Bld: 104 mg/dL — ABNORMAL HIGH (ref 70–99)
Potassium: 3.2 mmol/L — ABNORMAL LOW (ref 3.5–5.1)
Sodium: 136 mmol/L (ref 135–145)
Total Bilirubin: 0.8 mg/dL (ref 0.3–1.2)
Total Protein: 6.7 g/dL (ref 6.5–8.1)

## 2022-09-06 LAB — URINALYSIS, W/ REFLEX TO CULTURE (INFECTION SUSPECTED)
Bacteria, UA: NONE SEEN
Bilirubin Urine: NEGATIVE
Glucose, UA: NEGATIVE mg/dL
Hgb urine dipstick: NEGATIVE
Ketones, ur: 5 mg/dL — AB
Leukocytes,Ua: NEGATIVE
Nitrite: NEGATIVE
Protein, ur: >=300 mg/dL — AB
Specific Gravity, Urine: 1.018 (ref 1.005–1.030)
pH: 7 (ref 5.0–8.0)

## 2022-09-06 LAB — CBC WITH DIFFERENTIAL/PLATELET
Abs Immature Granulocytes: 0.2 10*3/uL — ABNORMAL HIGH (ref 0.00–0.07)
Band Neutrophils: 11 %
Basophils Absolute: 0 10*3/uL (ref 0.0–0.1)
Basophils Relative: 1 %
Eosinophils Absolute: 0 10*3/uL (ref 0.0–0.5)
Eosinophils Relative: 0 %
HCT: 38.8 % (ref 36.0–46.0)
Hemoglobin: 12.7 g/dL (ref 12.0–15.0)
Lymphocytes Relative: 16 %
Lymphs Abs: 0.8 10*3/uL (ref 0.7–4.0)
MCH: 31.6 pg (ref 26.0–34.0)
MCHC: 32.7 g/dL (ref 30.0–36.0)
MCV: 96.5 fL (ref 80.0–100.0)
Metamyelocytes Relative: 1 %
Monocytes Absolute: 0.6 10*3/uL (ref 0.1–1.0)
Monocytes Relative: 13 %
Myelocytes: 3 %
Neutro Abs: 3.1 10*3/uL (ref 1.7–7.7)
Neutrophils Relative %: 55 %
Platelets: 66 10*3/uL — ABNORMAL LOW (ref 150–400)
RBC: 4.02 MIL/uL (ref 3.87–5.11)
RDW: 15.5 % (ref 11.5–15.5)
Smear Review: DECREASED
WBC: 4.7 10*3/uL (ref 4.0–10.5)
nRBC: 0 % (ref 0.0–0.2)

## 2022-09-06 LAB — CBC
HCT: 33.2 % — ABNORMAL LOW (ref 36.0–46.0)
Hemoglobin: 11.3 g/dL — ABNORMAL LOW (ref 12.0–15.0)
MCH: 32.2 pg (ref 26.0–34.0)
MCHC: 34 g/dL (ref 30.0–36.0)
MCV: 94.6 fL (ref 80.0–100.0)
Platelets: 62 10*3/uL — ABNORMAL LOW (ref 150–400)
RBC: 3.51 MIL/uL — ABNORMAL LOW (ref 3.87–5.11)
RDW: 15.5 % (ref 11.5–15.5)
WBC: 4.3 10*3/uL (ref 4.0–10.5)
nRBC: 0 % (ref 0.0–0.2)

## 2022-09-06 LAB — RESP PANEL BY RT-PCR (RSV, FLU A&B, COVID)  RVPGX2
Influenza A by PCR: NEGATIVE
Influenza B by PCR: NEGATIVE
Resp Syncytial Virus by PCR: NEGATIVE
SARS Coronavirus 2 by RT PCR: POSITIVE — AB

## 2022-09-06 LAB — PROTIME-INR
INR: 1.1 (ref 0.8–1.2)
Prothrombin Time: 14 seconds (ref 11.4–15.2)

## 2022-09-06 LAB — LACTIC ACID, PLASMA
Lactic Acid, Venous: 1.2 mmol/L (ref 0.5–1.9)
Lactic Acid, Venous: 0.7 mmol/L (ref 0.5–1.9)

## 2022-09-06 LAB — APTT: aPTT: 33 seconds (ref 24–36)

## 2022-09-06 MED ORDER — POTASSIUM CHLORIDE CRYS ER 20 MEQ PO TBCR
20.0000 meq | EXTENDED_RELEASE_TABLET | Freq: Once | ORAL | Status: AC
  Administered 2022-09-06: 20 meq via ORAL
  Filled 2022-09-06: qty 1

## 2022-09-06 MED ORDER — ACETAMINOPHEN 325 MG PO TABS
650.0000 mg | ORAL_TABLET | Freq: Four times a day (QID) | ORAL | Status: DC | PRN
  Administered 2022-09-07: 650 mg via ORAL
  Filled 2022-09-06: qty 2

## 2022-09-06 MED ORDER — DEXAMETHASONE SODIUM PHOSPHATE 10 MG/ML IJ SOLN
6.0000 mg | Freq: Every day | INTRAMUSCULAR | Status: DC
  Administered 2022-09-06 – 2022-09-07 (×2): 6 mg via INTRAVENOUS
  Filled 2022-09-06 (×2): qty 1

## 2022-09-06 MED ORDER — ACETAMINOPHEN 650 MG RE SUPP: 650.0000 mg | Freq: Four times a day (QID) | RECTAL | Status: DC | PRN

## 2022-09-06 MED ORDER — ENOXAPARIN SODIUM 30 MG/0.3ML IJ SOSY
30.0000 mg | PREFILLED_SYRINGE | INTRAMUSCULAR | Status: DC
  Administered 2022-09-06: 30 mg via SUBCUTANEOUS
  Filled 2022-09-06: qty 0.3

## 2022-09-06 MED ORDER — SODIUM CHLORIDE 0.9 % IV SOLN
1.0000 g | Freq: Once | INTRAVENOUS | Status: AC
  Administered 2022-09-06: 1 g via INTRAVENOUS
  Filled 2022-09-06: qty 10

## 2022-09-06 MED ORDER — SODIUM CHLORIDE 0.9 % IV SOLN
500.0000 mg | Freq: Once | INTRAVENOUS | Status: AC
  Administered 2022-09-06: 500 mg via INTRAVENOUS
  Filled 2022-09-06: qty 5

## 2022-09-06 MED ORDER — ACETAMINOPHEN 650 MG RE SUPP
650.0000 mg | Freq: Once | RECTAL | Status: AC
  Administered 2022-09-06: 650 mg via RECTAL
  Filled 2022-09-06: qty 1

## 2022-09-06 NOTE — Assessment & Plan Note (Deleted)
Was in goal range SpO2 given COPD history, but given new finding on lung X-ray concerning for pneumonia and 2L [place] placed in ED, admit for observation. With symptom onset <5 days ago and risk factors for severe illness with COVID, will treat with steroids. - Admit to [facility], med-surg, attending Dr. Owens Shark - S/p ceftriaxone and azithromycin x 1 in ED, consider redose - Dexamethasone daily  - Wean oxygen as tolerated, maintain oxygen saturations 88-92% - Consider DuoNebs if signs/symptoms of COPD exacerbation develop

## 2022-09-06 NOTE — Assessment & Plan Note (Addendum)
Platelets of 66k, 150k three months ago.  Could be in setting of leucovorin (MITP), vitamin B12 deficiency (known), and more likely acute viral suppression from COVID-19. - Repeat CBC with differential in a.m. - Given acute change, obtain blood smear

## 2022-09-06 NOTE — ED Notes (Signed)
ED TO INPATIENT HANDOFF REPORT  ED Nurse Name and Phone #: Inda Merlin [phone]   S Name/Age/Gender Kayla Hurley 88 y.o. female Room/Bed: 004C/004C  Code Status   Code Status: Not on file  Home/SNF/Other Home Patient oriented to: self, place, time, and situation Is this baseline? Yes   Triage Complete: Triage complete  Chief Complaint Acute hypoxic respiratory failure [J96.01]  Triage Note From home via EMS with c/o left hip and left knee pain that onset following fall on Sunday. Patient was evaluated by EMS, refused transport at that time. Called today for increased pain. Distal CMS intact.   Of note pt is febrile and reports a productive cough x3 days   Allergies Allergies  Allergen Reactions   Adhesive [Tape] Other (See Comments)    blisters   Penicillins Diarrhea, Nausea And Vomiting and Rash    Level of Care/Admitting Diagnosis ED Disposition     ED Disposition  Admit   Condition  --   Comment  Hospital Area: [facility] [[id number]]  Level of Care: Med-Surg [16]  May place patient in observation at [facility] or [facility] if equivalent level of care is available:: No  Covid Evaluation: Confirmed COVID Positive  Diagnosis: Acute hypoxic respiratory failure [id number]  Admitting Physician: Leslie Dales [id number]  Attending Physician: Martyn Malay [id number]          B Medical/Surgery History Past Medical History:  Diagnosis Date   COPD (chronic obstructive pulmonary disease)    Glaucoma    Hypertensive retinopathy    Macular degeneration    Meningioma    Rheumatoid arthritis    Past Surgical History:  Procedure Laterality Date   APPENDECTOMY     CATARACT EXTRACTION     CESAREAN SECTION     x 3   CHOLECYSTECTOMY     CRANIECTOMY / CRANIOTOMY FOR EXCISION OF BRAIN TUMOR     meningioma   EYE SURGERY     HYSTEROSCOPY WITH D & C     INGUINAL HERNIA REPAIR     YAG LASER APPLICATION       A IV  Location/Drains/Wounds Patient Lines/Drains/Airways Status     Active Line/Drains/Airways     Name Placement date Placement time Site Days   Peripheral IV 09/06/22 20 G Anterior;Proximal;Right Forearm 09/06/22  1539  Forearm  less than 1   Peripheral IV 09/06/22 22 G Left;Posterior Hand 09/06/22  1610  Hand  less than 1            Intake/Output Last 24 hours  Intake/Output Summary (Last 24 hours) at 09/06/2022 2009 Last data filed at 09/06/2022 1858 Gross per 24 hour  Intake 350 ml  Output --  Net 350 ml    Labs/Imaging Results for orders placed or performed during the hospital encounter of 09/06/22 (from the past 48 hour(s))  Lactic acid, plasma     Status: None   Collection Time: 09/06/22  3:31 PM  Result Value Ref Range   Lactic Acid, Venous 1.2 0.5 - 1.9 mmol/L    Comment: Performed at [facility], [address] [place], [place] [address]  Comprehensive metabolic panel     Status: Abnormal   Collection Time: 09/06/22  3:31 PM  Result Value Ref Range   Sodium 136 135 - 145 mmol/L   Potassium 3.2 (L) 3.5 - 5.1 mmol/L   Chloride 99 98 - 111 mmol/L   CO2 28 22 - 32 mmol/L   Glucose, Bld 104 (H)  70 - 99 mg/dL    Comment: Glucose reference range applies only to samples taken after fasting for at least 8 hours.   BUN 8 8 - 23 mg/dL   Creatinine, Ser 0.82 0.44 - 1.00 mg/dL   Calcium 8.9 8.9 - 10.3 mg/dL   Total Protein 6.7 6.5 - 8.1 g/dL   Albumin 3.3 (L) 3.5 - 5.0 g/dL   AST 45 (H) 15 - 41 U/L   ALT 19 0 - 44 U/L   Alkaline Phosphatase 37 (L) 38 - 126 U/L   Total Bilirubin 0.8 0.3 - 1.2 mg/dL   GFR, Estimated >60 >60 mL/min    Comment: (NOTE) Calculated using the CKD-EPI Creatinine Equation (2021)    Anion gap 9 5 - 15    Comment: Performed at [facility] [address] [place], [place] [address]  CBC with Differential     Status: Abnormal   Collection Time: 09/06/22  3:31 PM  Result Value Ref Range   WBC 4.7 4.0 - 10.5 K/uL   RBC 4.02 3.87 -  5.11 MIL/uL   Hemoglobin 12.7 12.0 - 15.0 g/dL   HCT 38.8 36.0 - 46.0 %   MCV 96.5 80.0 - 100.0 fL   MCH 31.6 26.0 - 34.0 pg   MCHC 32.7 30.0 - 36.0 g/dL   RDW 15.5 11.5 - 15.5 %   Platelets 66 (L) 150 - 400 K/uL    Comment: Immature Platelet Fraction may be clinically indicated, consider ordering this additional test [id number] REPEATED TO VERIFY    nRBC 0.0 0.0 - 0.2 %   Neutrophils Relative % 55 %   Neutro Abs 3.1 1.7 - 7.7 K/uL   Band Neutrophils 11 %   Lymphocytes Relative 16 %   Lymphs Abs 0.8 0.7 - 4.0 K/uL   Monocytes Relative 13 %   Monocytes Absolute 0.6 0.1 - 1.0 K/uL   Eosinophils Relative 0 %   Eosinophils Absolute 0.0 0.0 - 0.5 K/uL   Basophils Relative 1 %   Basophils Absolute 0.0 0.0 - 0.1 K/uL   WBC Morphology Mild Left Shift (1-5% metas, occ myelo)    RBC Morphology MORPHOLOGY UNREMARKABLE    Smear Review PLATELETS APPEAR DECREASED    Metamyelocytes Relative 1 %   Myelocytes 3 %   Abs Immature Granulocytes 0.20 (H) 0.00 - 0.07 K/uL   Reactive, Benign Lymphocytes PRESENT    Giant PLTs PRESENT     Comment: Performed at [facility], [place] [address], [place], [place] [address]  Protime-INR     Status: None   Collection Time: 09/06/22  3:31 PM  Result Value Ref Range   Prothrombin Time 14.0 11.4 - 15.2 seconds   INR 1.1 0.8 - 1.2    Comment: (NOTE) INR goal varies based on device and disease states. Performed at [facility], [place] [address], [place], [place] [address]   APTT     Status: None   Collection Time: 09/06/22  3:31 PM  Result Value Ref Range   aPTT 33 24 - 36 seconds    Comment: Performed at [facility] [address] [place], [place] [address]  Resp panel by RT-PCR (RSV, Flu A&B, Covid)     Status: Abnormal   Collection Time: 09/06/22  3:31 PM   Specimen: Nasal Swab  Result Value Ref Range   SARS Coronavirus 2 by RT PCR POSITIVE (A) NEGATIVE   Influenza A by PCR NEGATIVE NEGATIVE   Influenza  B by PCR NEGATIVE NEGATIVE     Comment: (NOTE) The Xpert Xpress SARS-CoV-2/FLU/RSV plus assay is intended as an aid in the diagnosis of influenza from Nasopharyngeal swab specimens and should not be used as a sole basis for treatment. Nasal washings and aspirates are unacceptable for Xpert Xpress SARS-CoV-2/FLU/RSV testing.  Fact Sheet for Patients: EntrepreneurPulse.com.au  Fact Sheet for Healthcare Providers: IncredibleEmployment.be  This test is not yet approved or cleared by the [place] FDA and has been authorized for detection and/or diagnosis of SARS-CoV-2 by FDA under an Emergency Use Authorization (EUA). This EUA will remain in effect (meaning this test can be used) for the duration of the COVID-19 declaration under Section 564(b)(1) of the Act, 21 U.S.C. section 360bbb-3(b)(1), unless the authorization is terminated or revoked.     Resp Syncytial Virus by PCR NEGATIVE NEGATIVE    Comment: (NOTE) Fact Sheet for Patients: EntrepreneurPulse.com.au  Fact Sheet for Healthcare Providers: IncredibleEmployment.be  This test is not yet approved or cleared by the [place] FDA and has been authorized for detection and/or diagnosis of SARS-CoV-2 by FDA under an Emergency Use Authorization (EUA). This EUA will remain in effect (meaning this test can be used) for the duration of the COVID-19 declaration under Section 564(b)(1) of the Act, 21 U.S.C. section 360bbb-3(b)(1), unless the authorization is terminated or revoked.  Performed at [facility], [place] [address], [place], [place] [address]   Urinalysis, w/ Reflex to Culture (Infection Suspected) -Urine, Clean Catch     Status: Abnormal   Collection Time: 09/06/22  4:05 PM  Result Value Ref Range   Specimen Source URINE, CLEAN CATCH    Color, Urine YELLOW YELLOW   APPearance CLEAR CLEAR   Specific Gravity, Urine 1.018 1.005 - 1.030   pH 7.0 5.0 - 8.0   Glucose,  UA NEGATIVE NEGATIVE mg/dL   Hgb urine dipstick NEGATIVE NEGATIVE   Bilirubin Urine NEGATIVE NEGATIVE   Ketones, ur 5 (A) NEGATIVE mg/dL   Protein, ur >=300 (A) NEGATIVE mg/dL   Nitrite NEGATIVE NEGATIVE   Leukocytes,Ua NEGATIVE NEGATIVE   RBC / HPF 0-5 0 - 5 RBC/hpf   WBC, UA 0-5 0 - 5 WBC/hpf    Comment:        Reflex urine culture not performed if WBC <=10, OR if Squamous epithelial cells >5. If Squamous epithelial cells >5 suggest recollection.    Bacteria, UA NONE SEEN NONE SEEN   Squamous Epithelial / HPF 0-5 0 - 5 /HPF   Mucus PRESENT     Comment: Performed at [facility], [place] [address], [place], [place] [address]  Lactic acid, plasma     Status: None   Collection Time: 09/06/22  6:24 PM  Result Value Ref Range   Lactic Acid, Venous 0.7 0.5 - 1.9 mmol/L    Comment: Performed at [facility] [address], [place], [place] [address]  CBC     Status: Abnormal   Collection Time: 09/06/22  7:15 PM  Result Value Ref Range   WBC 4.3 4.0 - 10.5 K/uL   RBC 3.51 (L) 3.87 - 5.11 MIL/uL   Hemoglobin 11.3 (L) 12.0 - 15.0 g/dL   HCT 33.2 (L) 36.0 - 46.0 %   MCV 94.6 80.0 - 100.0 fL   MCH 32.2 26.0 - 34.0 pg   MCHC 34.0 30.0 - 36.0 g/dL   RDW 15.5 11.5 - 15.5 %   Platelets 62 (L) 150 - 400 K/uL  Comment: Immature Platelet Fraction may be clinically indicated, consider ordering this additional test [id number] REPEATED TO VERIFY    nRBC 0.0 0.0 - 0.2 %    Comment: Performed at [facility], [place] [address] [place], [place] [address]   CT Lumbar Spine Wo Contrast  Result Date: 09/06/2022 CLINICAL DATA:  [place].  Back pain. EXAM: CT LUMBAR SPINE WITHOUT CONTRAST TECHNIQUE: Multidetector CT imaging of the lumbar spine was performed without intravenous contrast administration. Multiplanar CT image reconstructions were also generated. RADIATION DOSE REDUCTION: This exam was performed according to the departmental dose-optimization program which includes automated  exposure control, adjustment of the mA and/or kV according to patient size and/or use of iterative reconstruction technique. COMPARISON:  None Available. FINDINGS: Segmentation: There are five lumbar type vertebral bodies. The last full intervertebral disc space is labeled L5-S1. Alignment: Normal Vertebrae: Age related osteoporosis but no acute lumbar spine fracture. No bone lesions. Paraspinal and other soft tissues: No significant paraspinal retroperitoneal findings. There is tortuosity and advanced calcification of the abdominal aorta and iliac arteries but no aneurysm. No retroperitoneal adenopathy. The visualized kidneys are grossly normal. No retroperitoneal hematoma. Disc levels: L1-2: Bulging annulus and mild facet disease contributing to mild spinal and bilateral lateral recess stenosis. L2-3: Diffuse bulging annulus, moderate facet disease and ligamentum flavum thickening contributing to he moderately severe spinal and bilateral lateral recess stenosis. No significant foraminal stenosis. L3-4: Diffuse bulging degenerated annulus, advanced facet disease and ligamentum flavum thickening contributing to severe spinal and bilateral lateral recess stenosis. No significant foraminal stenosis. L4-5: Bulging annulus, advanced facet disease and ligamentum flavum thickening contributing to moderate spinal and bilateral lateral recess stenosis. L5-S1: Bulging annulus and advanced facet disease without significant spinal or foraminal stenosis. IMPRESSION: 1. Normal alignment and no acute lumbar spine fracture. 2. Age related osteoporosis but no bone lesions. 3. Moderately severe spinal and bilateral lateral recess stenosis at L2-3 and L3-4. 4. Moderate spinal and bilateral lateral recess stenosis at L4-5. 5. Aortic atherosclerosis. Aortic Atherosclerosis (ICD10-I70.0). Electronically Signed   By: Marijo Sanes M.D.   On: 09/06/2022 18:36   CT Head Wo Contrast  Result Date: 09/06/2022 CLINICAL DATA:  [place].  Hit  head. EXAM: CT HEAD WITHOUT CONTRAST CT CERVICAL SPINE WITHOUT CONTRAST TECHNIQUE: Multidetector CT imaging of the head and cervical spine was performed following the standard protocol without intravenous contrast. Multiplanar CT image reconstructions of the cervical spine were also generated. RADIATION DOSE REDUCTION: This exam was performed according to the departmental dose-optimization program which includes automated exposure control, adjustment of the mA and/or kV according to patient size and/or use of iterative reconstruction technique. COMPARISON:  None Available. FINDINGS: CT HEAD FINDINGS Brain: Age related cerebral atrophy, ventriculomegaly and periventricular white matter disease. Moderate-sized area encephalomalacia at the right vertex and evidence of prior craniotomy. History of meningioma resection. No acute intracranial findings or mass lesions. No extra-axial fluid collections are identified. The brainstem cerebellum grossly. Scattered benign basal ganglia calcifications. Vascular: Moderate vascular calcifications but no aneurysm or hyperdense vessels. Skull: Remote craniotomy at the vertex.  No acute skull fracture. Sinuses/Orbits: Sign scattered ethmoid and maxillary sinus disease. The mastoid air cells and middle ear cavities are clear. The globes are intact. Other: No scalp lesions or scalp hematoma. CT CERVICAL SPINE FINDINGS Alignment: Normal Skull base and vertebrae: No acute fracture. No primary bone lesion or focal pathologic process. Soft tissues and spinal canal: No prevertebral fluid or swelling. No visible canal hematoma. Disc levels: The spinal canal is  fairly generous. No large disc protrusions or canal stenosis. Mild multilevel bony foraminal stenosis due to uncinate spurring and facet disease. Upper chest: The lung apices are grossly clear. Other: Bilateral carotid artery calcifications. IMPRESSION: 1. Age related cerebral atrophy, ventriculomegaly and periventricular white matter  disease. 2. Moderate-sized area of encephalomalacia at the right vertex and evidence of prior craniotomy. History of meningioma resection. 3. No acute intracranial findings or skull fracture. 4. Normal alignment of the cervical spine without acute fracture. Electronically Signed   By: Marijo Sanes M.D.   On: 09/06/2022 18:31   CT Cervical Spine Wo Contrast  Result Date: 09/06/2022 CLINICAL DATA:  [place].  Hit head. EXAM: CT HEAD WITHOUT CONTRAST CT CERVICAL SPINE WITHOUT CONTRAST TECHNIQUE: Multidetector CT imaging of the head and cervical spine was performed following the standard protocol without intravenous contrast. Multiplanar CT image reconstructions of the cervical spine were also generated. RADIATION DOSE REDUCTION: This exam was performed according to the departmental dose-optimization program which includes automated exposure control, adjustment of the mA and/or kV according to patient size and/or use of iterative reconstruction technique. COMPARISON:  None Available. FINDINGS: CT HEAD FINDINGS Brain: Age related cerebral atrophy, ventriculomegaly and periventricular white matter disease. Moderate-sized area encephalomalacia at the right vertex and evidence of prior craniotomy. History of meningioma resection. No acute intracranial findings or mass lesions. No extra-axial fluid collections are identified. The brainstem cerebellum grossly. Scattered benign basal ganglia calcifications. Vascular: Moderate vascular calcifications but no aneurysm or hyperdense vessels. Skull: Remote craniotomy at the vertex.  No acute skull fracture. Sinuses/Orbits: Sign scattered ethmoid and maxillary sinus disease. The mastoid air cells and middle ear cavities are clear. The globes are intact. Other: No scalp lesions or scalp hematoma. CT CERVICAL SPINE FINDINGS Alignment: Normal Skull base and vertebrae: No acute fracture. No primary bone lesion or focal pathologic process. Soft tissues and spinal canal: No prevertebral  fluid or swelling. No visible canal hematoma. Disc levels: The spinal canal is fairly generous. No large disc protrusions or canal stenosis. Mild multilevel bony foraminal stenosis due to uncinate spurring and facet disease. Upper chest: The lung apices are grossly clear. Other: Bilateral carotid artery calcifications. IMPRESSION: 1. Age related cerebral atrophy, ventriculomegaly and periventricular white matter disease. 2. Moderate-sized area of encephalomalacia at the right vertex and evidence of prior craniotomy. History of meningioma resection. 3. No acute intracranial findings or skull fracture. 4. Normal alignment of the cervical spine without acute fracture. Electronically Signed   By: Marijo Sanes M.D.   On: 09/06/2022 18:31   DG Pelvis 1-2 Views  Result Date: 09/06/2022 CLINICAL DATA:  Pain status post fall EXAM: PELVIS - 1 VIEW COMPARISON:  None Available. FINDINGS: Bilateral hip degenerative changes with joint space narrowing and osteophytes. Lumbosacral degenerative changes. No acute fracture, dislocation or subluxation. IMPRESSION: Bilateral hip degenerative changes. No acute osseous abnormalities identified. Electronically Signed   By: Sammie Bench M.D.   On: 09/06/2022 17:45   DG Chest Port 1 View  Result Date: 09/06/2022 CLINICAL DATA:  Possible sepsis EXAM: PORTABLE CHEST 1 VIEW COMPARISON:  06/02/2022 FINDINGS: Right lung is grossly clear. Heterogeneous airspace disease in the left mid to lower lung. Normal cardiac size. Aortic atherosclerosis. IMPRESSION: Heterogeneous airspace disease in the left mid to lower lung, suspicious for pneumonia. Radiographic follow-up to resolution recommended. Electronically Signed   By: Donavan Foil M.D.   On: 09/06/2022 16:42    Pending Labs [employer] (From admission, onward)     Start  Ordered   09/06/22 1531  Blood Culture (routine x 2)  (Undifferentiated presentation (screening labs and basic nursing orders))  BLOOD CULTURE X 2,   STAT       09/06/22 1530            Vitals/Pain Today's Vitals   09/06/22 1733 09/06/22 1826 09/06/22 1826 09/06/22 1900  BP:    (!) 118/59  Pulse: 91  81 73  Resp: 15  (!) 22 19  Temp:   (!) 101.2 F (38.4 C)   TempSrc:   Oral   SpO2: 95%  96% 96%  Weight:      Height:      PainSc:  3       Isolation Precautions No active isolations  Medications Medications  acetaminophen (TYLENOL) suppository 650 mg (650 mg Rectal Given 09/06/22 1701)  cefTRIAXone (ROCEPHIN) 1 g in sodium chloride 0.9 % 100 mL IVPB (0 g Intravenous Stopped 09/06/22 1739)  azithromycin (ZITHROMAX) 500 mg in sodium chloride 0.9 % 250 mL IVPB (0 mg Intravenous Stopped 09/06/22 1858)    Mobility walks with device     Focused Assessments    R Recommendations: See Admitting Provider Note  Report given to:   Additional Notes: Patient is COVID + requiring O2. She is alert, oriented, and kind and cooperative. She has a fall at home yesterday with some fall and hip pain.

## 2022-09-06 NOTE — ED Provider Notes (Signed)
[facility] Provider Note   CSN: [id number] Arrival date & time: 09/06/22  1449     History Chief Complaint  Patient presents with   Fall   Cough    Kayla Hurley is a 88 y.o. female with history of COPD, neuropathy, hypothyroidism, rheumatoid arthritis on methotrexate, osteoporosis, hypertension, essential tremor presents emergency room today for evaluation of fall on Sunday as well as cough for the past 4 days.  History mainly obtained by the patient's sister as patient is not wearing her hearing aids and has difficulty understanding.  Attempted to write down questions however patient did not bring her glasses and cannot see the paper. Sister reports she is at her baseline. The patient developed a cough that was productive with green mucus as well as some runny nose and nasal congestion.  Did not take any temperature although patient reports that she overall felt achy.  No other medications trialed.  She describes some chest tightness and some mildly worsening shortness of breath from her baseline.  Additionally, patient reports that she fell asleep while at her kitchen table and fell over hitting the right side of her head.  Patient reports that she is on this before but could not catch herself enough time.  She reports some lower left back pain but has been ambulatory since the fall.  She denies any abdominal pain or any other extremity pain.   Fall Associated symptoms include shortness of breath. Pertinent negatives include no chest pain, no abdominal pain and no headaches.  Cough Associated symptoms: myalgias, rhinorrhea and shortness of breath   Associated symptoms: no chest pain, no chills, no fever and no headaches        Home Medications Prior to Admission medications   Medication Sig Start Date End Date Taking? Authorizing Provider  ALPRAZolam Duanne Moron) 0.25 MG tablet Take 0.25 mg by mouth at bedtime as needed for anxiety. Patient not  taking: Reported on 11/03/2021    [provider]  Calcium-Phosphorus-Vitamin D (CITRACAL +D3) [id number] MG-MG-UNIT CHEW See admin instructions.    [provider]  diclofenac Sodium (VOLTAREN) 1 % GEL Apply topically. 11/13/17   [provider]  folic acid (FOLVITE) 1 MG tablet Take 1 mg by mouth daily. Take two tablets daily    [provider]  gabapentin (NEURONTIN) 100 MG capsule Take 1 capsule (100 mg total) by mouth at bedtime. 08/04/22   Tat, Eustace Quail, DO  hydrochlorothiazide (HYDRODIURIL) 25 MG tablet Take 25 mg by mouth daily. 12/30/20   [provider]  leucovorin (WELLCOVORIN) 5 MG tablet TAKE 2 TABLETS BY MOUTH ONCE A WEEK 08/12/19   [provider]  levalbuterol (XOPENEX HFA) 45 MCG/ACT inhaler Inhale 2 puffs into the lungs every 6 (six) hours as needed for wheezing or shortness of breath. Patient not taking: Reported on 06/20/2022 04/22/22 04/22/23  Clayton Bibles, NP  levothyroxine (SYNTHROID) 50 MCG tablet Take 50 mcg by mouth daily before breakfast.    [provider]  methotrexate (RHEUMATREX) 2.5 MG tablet Take 10 mg by mouth once a week. Caution:Chemotherapy. Protect from light.    [provider]  Multiple Vitamins-Minerals (PRESERVISION AREDS 2+MULTI VIT) CAPS See admin instructions.    [provider]  nicotine polacrilex (COMMIT) 2 MG lozenge Place inside cheek. Patient not taking: Reported on 04/19/2022    [provider]  potassium chloride (KLOR-CON) 20 MEQ packet Take 20 mEq by mouth 2 (two) times daily.  [provider]  primidone (MYSOLINE) 50 MG tablet TAKE 1 TABLET BY MOUTH AT BEDTIME 07/25/22   Tat, Eustace Quail, DO  propranolol (INDERAL) 10 MG tablet Take 1 tablet (10 mg total) by mouth 3 (three) times daily. Patient not taking: Reported on 06/02/2022 11/19/20   Tat, Eustace Quail, DO  Propylene Glycol (SYSTANE BALANCE OP) Apply to eye.    [provider]  salsalate  (DISALCID) 750 MG tablet Take 2 tablets by mouth 2 (two) times daily. 09/02/19   [provider]  timolol (BETIMOL) 0.25 % ophthalmic solution Place 1 drop into both eyes 2 (two) times daily.    [provider]      Allergies    Adhesive [tape] and Penicillins    Review of Systems   Review of Systems  Constitutional:  Negative for chills and fever.  HENT:  Positive for congestion and rhinorrhea.   Respiratory:  Positive for cough and shortness of breath.   Cardiovascular:  Negative for chest pain.  Gastrointestinal:  Negative for abdominal pain, constipation, diarrhea, nausea and vomiting.  Genitourinary:  Negative for dysuria and hematuria.  Musculoskeletal:  Positive for back pain and myalgias. Negative for neck pain.  Neurological:  Negative for syncope, light-headedness and headaches.    Physical Exam Updated Vital Signs BP (!) 113/50 (BP Location: Right Arm)   Pulse 70   Temp 98 F (36.7 C) (Oral)   Resp 19   Ht 5\' 2"  (1.575 m)   Wt 61.2 kg   SpO2 97%   BMI 24.69 kg/m  Physical Exam Vitals and nursing note reviewed.  Constitutional:      General: She is not in acute distress.    Appearance: She is not ill-appearing or toxic-appearing.  HENT:     Head: Normocephalic.     Comments: Scab scalp abrasion with surrounding bruising noted to the right temple.  Mild tenderness to palpation.  No step-offs or deformities.  No raccoon eyes or battle signs.    Mouth/Throat:     Mouth: Mucous membranes are moist.  Eyes:     General: No scleral icterus.    Extraocular Movements: Extraocular movements intact.     Pupils: Pupils are equal, round, and reactive to light.  Cardiovascular:     Rate and Rhythm: Normal rate and regular rhythm.  Pulmonary:     Effort: Pulmonary effort is normal. No respiratory distress.     Comments: Speaking in full sentences without difficulty  Abdominal:     General: Bowel sounds are normal.     Palpations: Abdomen is soft.      Tenderness: There is no abdominal tenderness. There is no guarding or rebound.     Comments: Abdomen is soft and nontender to palpation  Musculoskeletal:        General: No deformity.     Cervical back: Normal range of motion. No rigidity or tenderness.     Comments: Trace edema to the lower bilateral extremities.   BACK - Lower left sided paraspinal tenderness to palpation. No step-offs or deformities.  No overlying skin changes or signs of trauma.  Positive straight leg raise on the left.    BLE - No pain on palpation other than her left lateral thigh. Strength is equal in patient's bilateral lower extremities.  Sensation intact and symmetric subjectively. Compartments are soft. Palpable pulses.   BUE - No pain on palpation. Strength intact. Compartments are soft. Equal palpable radial pulses.   Skin:    General: Skin  is warm and dry.  Neurological:     General: No focal deficit present.     Mental Status: She is alert. Mental status is at baseline.     GCS: GCS eye subscore is 4. GCS verbal subscore is 5. GCS motor subscore is 6.     Cranial Nerves: No cranial nerve deficit, dysarthria or facial asymmetry.     Sensory: No sensory deficit.     Comments: When the patient could hear me, she is answering questions appropriately with appropriate speech.  No facial droop noted.  Sensation reportedly intact.  She is alert and oriented x 4.     ED Results / Procedures / Treatments   Labs (all labs ordered are listed, but only abnormal results are displayed) Labs Reviewed  RESP PANEL BY RT-PCR (RSV, FLU A&B, COVID)  [id number] - Abnormal; Notable for the following components:      Result Value   SARS Coronavirus 2 by RT PCR POSITIVE (*)    All other components within normal limits  COMPREHENSIVE METABOLIC PANEL - Abnormal; Notable for the following components:   Potassium 3.2 (*)    Glucose, Bld 104 (*)    Albumin 3.3 (*)    AST 45 (*)    Alkaline Phosphatase 37 (*)    All other  components within normal limits  CBC WITH DIFFERENTIAL/PLATELET - Abnormal; Notable for the following components:   Platelets 66 (*)    Abs Immature Granulocytes 0.20 (*)    All other components within normal limits  URINALYSIS, W/ REFLEX TO CULTURE (INFECTION SUSPECTED) - Abnormal; Notable for the following components:   Ketones, ur 5 (*)    Protein, ur >=300 (*)    All other components within normal limits  CBC - Abnormal; Notable for the following components:   RBC 3.51 (*)    Hemoglobin 11.3 (*)    HCT 33.2 (*)    Platelets 62 (*)    All other components within normal limits  CULTURE, BLOOD (ROUTINE X 2)  CULTURE, BLOOD (ROUTINE X 2)  LACTIC ACID, PLASMA  LACTIC ACID, PLASMA  PROTIME-INR  APTT  COMPREHENSIVE METABOLIC PANEL  TECHNOLOGIST SMEAR REVIEW  DIFFERENTIAL  CBC WITH DIFFERENTIAL/PLATELET    EKG EKG Interpretation  Date/Time:  Tuesday September 06 2022 15:41:39 EDT Ventricular Rate:  90 PR Interval:    QRS Duration: 119 QT Interval:  366 QTC Calculation: 448 R Axis:   -56 Text Interpretation: Interpretation limited secondary to artifact Normal sinus rhythm LAD, consider left anterior fascicular block Low voltage, precordial leads Probable anteroseptal infarct, old Reconfirmed by Margaretmary Eddy [phone]) on 09/06/2022 6:02:11 PM  Radiology CT Lumbar Spine Wo Contrast  Result Date: 09/06/2022 CLINICAL DATA:  [place].  Back pain. EXAM: CT LUMBAR SPINE WITHOUT CONTRAST TECHNIQUE: Multidetector CT imaging of the lumbar spine was performed without intravenous contrast administration. Multiplanar CT image reconstructions were also generated. RADIATION DOSE REDUCTION: This exam was performed according to the departmental dose-optimization program which includes automated exposure control, adjustment of the mA and/or kV according to patient size and/or use of iterative reconstruction technique. COMPARISON:  None Available. FINDINGS: Segmentation: There are five lumbar type vertebral  bodies. The last full intervertebral disc space is labeled L5-S1. Alignment: Normal Vertebrae: Age related osteoporosis but no acute lumbar spine fracture. No bone lesions. Paraspinal and other soft tissues: No significant paraspinal retroperitoneal findings. There is tortuosity and advanced calcification of the abdominal aorta and iliac arteries but no aneurysm. No retroperitoneal adenopathy. The visualized kidneys are  grossly normal. No retroperitoneal hematoma. Disc levels: L1-2: Bulging annulus and mild facet disease contributing to mild spinal and bilateral lateral recess stenosis. L2-3: Diffuse bulging annulus, moderate facet disease and ligamentum flavum thickening contributing to he moderately severe spinal and bilateral lateral recess stenosis. No significant foraminal stenosis. L3-4: Diffuse bulging degenerated annulus, advanced facet disease and ligamentum flavum thickening contributing to severe spinal and bilateral lateral recess stenosis. No significant foraminal stenosis. L4-5: Bulging annulus, advanced facet disease and ligamentum flavum thickening contributing to moderate spinal and bilateral lateral recess stenosis. L5-S1: Bulging annulus and advanced facet disease without significant spinal or foraminal stenosis. IMPRESSION: 1. Normal alignment and no acute lumbar spine fracture. 2. Age related osteoporosis but no bone lesions. 3. Moderately severe spinal and bilateral lateral recess stenosis at L2-3 and L3-4. 4. Moderate spinal and bilateral lateral recess stenosis at L4-5. 5. Aortic atherosclerosis. Aortic Atherosclerosis (ICD10-I70.0). Electronically Signed   By: Marijo Sanes M.D.   On: 09/06/2022 18:36   CT Head Wo Contrast  Result Date: 09/06/2022 CLINICAL DATA:  [place].  Hit head. EXAM: CT HEAD WITHOUT CONTRAST CT CERVICAL SPINE WITHOUT CONTRAST TECHNIQUE: Multidetector CT imaging of the head and cervical spine was performed following the standard protocol without intravenous contrast.  Multiplanar CT image reconstructions of the cervical spine were also generated. RADIATION DOSE REDUCTION: This exam was performed according to the departmental dose-optimization program which includes automated exposure control, adjustment of the mA and/or kV according to patient size and/or use of iterative reconstruction technique. COMPARISON:  None Available. FINDINGS: CT HEAD FINDINGS Brain: Age related cerebral atrophy, ventriculomegaly and periventricular white matter disease. Moderate-sized area encephalomalacia at the right vertex and evidence of prior craniotomy. History of meningioma resection. No acute intracranial findings or mass lesions. No extra-axial fluid collections are identified. The brainstem cerebellum grossly. Scattered benign basal ganglia calcifications. Vascular: Moderate vascular calcifications but no aneurysm or hyperdense vessels. Skull: Remote craniotomy at the vertex.  No acute skull fracture. Sinuses/Orbits: Sign scattered ethmoid and maxillary sinus disease. The mastoid air cells and middle ear cavities are clear. The globes are intact. Other: No scalp lesions or scalp hematoma. CT CERVICAL SPINE FINDINGS Alignment: Normal Skull base and vertebrae: No acute fracture. No primary bone lesion or focal pathologic process. Soft tissues and spinal canal: No prevertebral fluid or swelling. No visible canal hematoma. Disc levels: The spinal canal is fairly generous. No large disc protrusions or canal stenosis. Mild multilevel bony foraminal stenosis due to uncinate spurring and facet disease. Upper chest: The lung apices are grossly clear. Other: Bilateral carotid artery calcifications. IMPRESSION: 1. Age related cerebral atrophy, ventriculomegaly and periventricular white matter disease. 2. Moderate-sized area of encephalomalacia at the right vertex and evidence of prior craniotomy. History of meningioma resection. 3. No acute intracranial findings or skull fracture. 4. Normal alignment of  the cervical spine without acute fracture. Electronically Signed   By: Marijo Sanes M.D.   On: 09/06/2022 18:31   CT Cervical Spine Wo Contrast  Result Date: 09/06/2022 CLINICAL DATA:  [place].  Hit head. EXAM: CT HEAD WITHOUT CONTRAST CT CERVICAL SPINE WITHOUT CONTRAST TECHNIQUE: Multidetector CT imaging of the head and cervical spine was performed following the standard protocol without intravenous contrast. Multiplanar CT image reconstructions of the cervical spine were also generated. RADIATION DOSE REDUCTION: This exam was performed according to the departmental dose-optimization program which includes automated exposure control, adjustment of the mA and/or kV according to patient size and/or use of iterative reconstruction technique. COMPARISON:  None Available.  FINDINGS: CT HEAD FINDINGS Brain: Age related cerebral atrophy, ventriculomegaly and periventricular white matter disease. Moderate-sized area encephalomalacia at the right vertex and evidence of prior craniotomy. History of meningioma resection. No acute intracranial findings or mass lesions. No extra-axial fluid collections are identified. The brainstem cerebellum grossly. Scattered benign basal ganglia calcifications. Vascular: Moderate vascular calcifications but no aneurysm or hyperdense vessels. Skull: Remote craniotomy at the vertex.  No acute skull fracture. Sinuses/Orbits: Sign scattered ethmoid and maxillary sinus disease. The mastoid air cells and middle ear cavities are clear. The globes are intact. Other: No scalp lesions or scalp hematoma. CT CERVICAL SPINE FINDINGS Alignment: Normal Skull base and vertebrae: No acute fracture. No primary bone lesion or focal pathologic process. Soft tissues and spinal canal: No prevertebral fluid or swelling. No visible canal hematoma. Disc levels: The spinal canal is fairly generous. No large disc protrusions or canal stenosis. Mild multilevel bony foraminal stenosis due to uncinate spurring and facet  disease. Upper chest: The lung apices are grossly clear. Other: Bilateral carotid artery calcifications. IMPRESSION: 1. Age related cerebral atrophy, ventriculomegaly and periventricular white matter disease. 2. Moderate-sized area of encephalomalacia at the right vertex and evidence of prior craniotomy. History of meningioma resection. 3. No acute intracranial findings or skull fracture. 4. Normal alignment of the cervical spine without acute fracture. Electronically Signed   By: Marijo Sanes M.D.   On: 09/06/2022 18:31   DG Pelvis 1-2 Views  Result Date: 09/06/2022 CLINICAL DATA:  Pain status post fall EXAM: PELVIS - 1 VIEW COMPARISON:  None Available. FINDINGS: Bilateral hip degenerative changes with joint space narrowing and osteophytes. Lumbosacral degenerative changes. No acute fracture, dislocation or subluxation. IMPRESSION: Bilateral hip degenerative changes. No acute osseous abnormalities identified. Electronically Signed   By: Sammie Bench M.D.   On: 09/06/2022 17:45   DG Chest Port 1 View  Result Date: 09/06/2022 CLINICAL DATA:  Possible sepsis EXAM: PORTABLE CHEST 1 VIEW COMPARISON:  06/02/2022 FINDINGS: Right lung is grossly clear. Heterogeneous airspace disease in the left mid to lower lung. Normal cardiac size. Aortic atherosclerosis. IMPRESSION: Heterogeneous airspace disease in the left mid to lower lung, suspicious for pneumonia. Radiographic follow-up to resolution recommended. Electronically Signed   By: Donavan Foil M.D.   On: 09/06/2022 16:42    Procedures Procedures   Medications Ordered in ED Medications  potassium chloride SA (KLOR-CON M) CR tablet 20 mEq (has no administration in time range)  enoxaparin (LOVENOX) injection 30 mg (has no administration in time range)  acetaminophen (TYLENOL) tablet 650 mg (has no administration in time range)    Or  acetaminophen (TYLENOL) suppository 650 mg (has no administration in time range)  dexamethasone (DECADRON) injection 6  mg (has no administration in time range)  acetaminophen (TYLENOL) suppository 650 mg (650 mg Rectal Given 09/06/22 1701)  cefTRIAXone (ROCEPHIN) 1 g in sodium chloride 0.9 % 100 mL IVPB (0 g Intravenous Stopped 09/06/22 1739)  azithromycin (ZITHROMAX) 500 mg in sodium chloride 0.9 % 250 mL IVPB (0 mg Intravenous Stopped 09/06/22 1858)    ED Course/ Medical Decision Making/ A&P    Medical Decision Making Amount and/or Complexity of Data Reviewed Labs: ordered. Radiology: ordered. ECG/medicine tests: ordered.  Risk OTC drugs. Prescription drug management. Decision regarding hospitalization.   88 y.o. female presents to the ER for evaluation of cough and fall. Differential diagnosis includes but is not limited to Upper respiratory infection, lower respiratory infection, allergies, asthma, irritants, foreign body, medications (ACE inhibitors), reflux, CHF, lung cancer, interstitial  lung disease, psychiatric causes, postnasal drip, intracranial abnormalities, fracture, COPD exacerbation. Vital signs BP at 113/50 and febrile to 103.58F rectally. Physical exam as noted above.   Patient was given PR Tylenol. ED evolving sepsis ordered.   I independently reviewed and interpreted the patient's labs.  Urinalysis shows 5 ketones and greater than 300 protein otherwise no signs of infection.  CMP shows mildly decreased potassium at 3.2.  Mildly elevated glucose at 104.  AST elevated over ALT but patient denies any alcohol use.  Albumin slightly decreased at 3.3.  CBC with differential shows platelet 66.  No leukocytosis or anemia seen.  Patient's labs were sitting before being evaluated for quite some time, I did repeat CBC to make sure this is an accurate reading.  Patient usually has borderline low to low platelets but never this low before.  Normal APTT PT/INR.  Blood cultures are pending.  Lactic acid within normal limits.  Patient COVID-positive.  CT head and neck show  1. Age related cerebral atrophy,  ventriculomegaly and  periventricular white matter disease.  2. Moderate-sized area of encephalomalacia at the right vertex and  evidence of prior craniotomy. History of meningioma resection.  3. No acute intracranial findings or skull fracture.  4. Normal alignment of the cervical spine without acute fracture.   CT lumbar shows  1. Normal alignment and no acute lumbar spine fracture.  2. Age related osteoporosis but no bone lesions.  3. Moderately severe spinal and bilateral lateral recess stenosis at  L2-3 and L3-4.  4. Moderate spinal and bilateral lateral recess stenosis at L4-5.  5. Aortic atherosclerosis.    Chest x-ray shows Heterogeneous airspace disease in the left mid to lower lung,  suspicious for pneumonia. Radiographic follow-up to resolution  recommended.   Patient was started on rocephin and azithromycin for pneumonia symptoms. Fever is down trending.   She does not have any signs of cord compression and is moving her legs with equal strength and sensation. She does have a fever with back pain bilaterally weakness from her COVID diagnosis and pneumonia seen on chest x-ray.  I doubt any epidural abscess.  I tried discussing back red flag symptoms with the patient however she was unable to hear me or read the note I wrote.  Younger sister is supposed bringing her hearing aids. Back pain was s/p a fall and she has been ambulatory, likely contusion, but could need additional imaging if warranted.   The patient is now requiring oxygen as her O2 will drop into the 80s.She is not on oxygen at baseline. 2L [place] ordered. Will require admission.  We discussed the results of the labs/imaging. The plan is for admission, patient is amenable. Hospitalist aware of thrombocytopenia and COVID PNA.   Portions of this report may have been transcribed using voice recognition software. Every effort was made to ensure accuracy; however, inadvertent computerized transcription errors may be  present.   I discussed this case with my attending physician who cosigned this note including patient's presenting symptoms, physical exam, and planned diagnostics and interventions. Attending physician stated agreement with plan or made changes to plan which were implemented.    Final Clinical Impression(s) / ED Diagnoses Final diagnoses:  COVID  Thrombocytopenia  Acute respiratory failure due to COVID-19    Rx / DC Orders ED Discharge Orders     None         Sherrell Puller, Hershal Coria 09/06/22 2204    Fransico Meadow, MD 09/07/22 [phone]

## 2022-09-06 NOTE — H&P (Cosign Needed Addendum)
Hospital Admission History and Physical Service Pager: [phone]  Patient name: Kayla Hurley Medical record number: [id number] Date of Birth: 11/18/1934 Age: 88 y.o. Gender: female  Primary Care Provider: Pcp, No Consultants: None Code Status: FULL which was confirmed with family if patient unable to confirm   Preferred Emergency Contact: Kayla Hurley (Sister) [phone]  Chief Complaint: Left hip pain and cough  Assessment and Plan: Kayla Hurley is a 88 y.o. female presenting with left hip pain after a fall on Sunday and increased cough. Found to be COVID-19 positive in ED, with oxygen saturation 88%.  With regard to fall on Sunday, no evidence of acute intracranial pathology and no evidence of fractures CT lumbar/cervical/head and pelvis/chest x-ray.  Low suspicion of stroke or seizure as patient is neurologically intact.  Pending medication reconciliation with regard to primidone, xanax and gabapentin.  Patient does have arthritis, which could explain her fall.  However, with history of falling asleep then falling-suspect possible vasovagal/orthostatic hypotension versus dehydration in setting of acute viral pneumonia.  Additional differential and plan as below.  Acute hypoxic respiratory failure, suspect this is likely secondary to her viral pneumonia-COVID-19 positive. Started on 2L [facility] in ED. Received antibiotics in ED (ceftriaxone azithromycin) for possible CAP, and will consider continuing-but suspect this is likely viral pneumonia. Moderate to severe category d/t O2 need and CXR findings-will prescribe dexamethasone. She is not on inhalers for COPD, nor do we believe this is COPD exacerbation - without wheezing and moving adequate air on exam.  At time of admission, patient was weaned to room air and maintaining oxygen saturations greater than 90% -speaking in full sentences.  * Acute hypoxic respiratory failure Was in goal range SpO2 given COPD history, but given new finding on lung  X-ray concerning for pneumonia and 2L [place] placed in ED, admit for observation. With symptom onset <5 days ago and risk factors for severe illness with COVID, will treat with steroids. - Admit to FMTS, med-surg, attending Dr. Owens Shark - S/p ceftriaxone and azithromycin x 1 in ED, consider redose - Dexamethasone daily  - Wean oxygen as tolerated, maintain oxygen saturations 88-92% - Consider DuoNebs if signs/symptoms of COPD exacerbation develop  COVID-19 Acute infection. Symptomatic. - Airborne precautions - Dexamethasone as above  Fall L hip pain not due to fracture. Consider bursitis, osteoarthritis. - Fall precautions - Orthostatic vital signs  Thrombocytopenia Platelets of 66k, 150k three months ago.  Could be in setting of leucovorin (MITP), vitamin B12 deficiency (known), and more likely acute viral suppression from COVID-19. - Repeat CBC with differential in a.m. - Given acute change, obtain blood smear   Chronic and stable medical conditions Hypothyroid: took synthroid this AM. Reorder once med rec complete Rheumatoid arthritis: Methotrexate on TH and Leucovorin on Fri. No need for dose at this time.  FEN/GI: Regular diet VTE Prophylaxis: Lovenox  Disposition: Med-surg  History of Present Illness:  Kayla Hurley is a 88 y.o. female presenting with cough  On Sunday, she fell out of her dining room chair and hit hard tile floor and hit her head when trying to get up off the floor. Then, she refused family taking her to get evaluated. Today, sister Kayla Hurley got a call from [place] that she was complaining of hip hurting.  She thinks she fell asleep which made her fall. Came in for hip pain mostly. Says her left side hip burns.  She reports coughing lately. Denies any acute shortness of breath. Does not use any inhalers for COPD.  She says she feels dehydrated which could be why she fell and also feeling a lot better now from drinking fluids in the ED. Kayla Hurley also says she is  looking a lot better.  In the ED, she had extensive imaging-no acute abnormalities, chronic findings present including age-related degenerative changes.  Found to be hypoxic below 88% and placed on 2L [facility].  CXR concerning for pneumonia, started on ceftriaxone and azithromycin x 1 dose.  Family medicine paged for admission.  Review Of Systems: Per HPI  Pertinent Past Medical History: Remainder reviewed in history tab.   Pertinent Past Surgical History:  Remainder reviewed in history tab.   Pertinent Social History: Tobacco use: Yes/No/Former Alcohol use: Denies Other Substance use: Denies Lives with two dogs; independent at baseline (showers, cleans, etc.)  Pertinent Family History: Reviewed in history tab.   Important Outpatient Medications: No gabapentin, nicotine, xanax Took Methotrexate on Thursday Takes Leucovorin on Friday Takes potassium BID  Pending medication reconciliation  Objective: BP (!) 113/50 (BP Location: Right Arm)   Pulse 70   Temp 98 F (36.7 C) (Oral)   Resp 19   Ht 5\' 2"  (1.575 m)   Wt 61.2 kg   SpO2 97%   BMI 24.69 kg/m  Exam: General: NAD, chronically ill-appearing, elderly female Head: Normocephalic, abrasion to right forehead.  Normal external ear . Rhinorrhea present.  EOM intact bilaterally, normal conjunctiva. Neck: FROM Cardiovascular: RRR, no murmurs. Respiratory: Coarse breath sounds bilaterally, normal work of breathing on room air Gastrointestinal: Soft, nontender, nondistended.  Bowel sounds present. MSK: Moving all 4 extremities.  Pain to palpation on left trochanter. Neuro: Alert and oriented x 4, no overt focal neurologic deficits Psych: Normal mood and thought  Labs:  CBC BMET  Recent Labs  Lab 09/06/22 1915  WBC 4.3  HGB 11.3*  HCT 33.2*  PLT 62*   Recent Labs  Lab 09/06/22 1531  NA 136  K 3.2*  CL 99  CO2 28  BUN 8  CREATININE 0.82  GLUCOSE 104*  CALCIUM 8.9    Respiratory panel: SARS coronavirus 2  positive PT-INR: 14, 1.1 Lactic acid: 1.2 > 0.7 Potassium: 3.2 UA: Significant for protein >300 Platelets: 66  EKG:  Normal sinus, artifact  Imaging Studies Performed:  CT Lumbar Spine Wo Contrast Result Date: 09/06/2022 IMPRESSION: 1. Normal alignment and no acute lumbar spine fracture. 2. Age related osteoporosis but no bone lesions. 3. Moderately severe spinal and bilateral lateral recess stenosis at L2-3 and L3-4. 4. Moderate spinal and bilateral lateral recess stenosis at L4-5. 5. Aortic atherosclerosis.  CT Head Wo Contrast Result Date: 09/06/2022 IMPRESSION: 1. Age related cerebral atrophy, ventriculomegaly and periventricular white matter disease. 2. Moderate-sized area of encephalomalacia at the right vertex and evidence of prior craniotomy. History of meningioma resection. 3. No acute intracranial findings or skull fracture. 4. Normal alignment of the cervical spine without acute fracture.   CT Cervical Spine Wo Contrast Result Date: 09/06/2022 IMPRESSION: 1. Age related cerebral atrophy, ventriculomegaly and periventricular white matter disease. 2. Moderate-sized area of encephalomalacia at the right vertex and evidence of prior craniotomy. History of meningioma resection. 3. No acute intracranial findings or skull fracture. 4. Normal alignment of the cervical spine without acute fracture.  DG Pelvis 1-2 Views Result Date: 09/06/2022 IMPRESSION: Bilateral hip degenerative changes. No acute osseous abnormalities identified.  DG Chest Port 1 View Result Date: 09/06/2022 IMPRESSION: Heterogeneous airspace disease in the left mid to lower lung, suspicious for pneumonia. Radiographic follow-up to resolution recommended.  Orvis Brill, DO 09/06/2022, 10:00 PM PGY-1, [place] Intern pager: [phone], text pages welcome Secure chat group [place]

## 2022-09-06 NOTE — Assessment & Plan Note (Addendum)
L hip pain persistent and with movement>palpation. XR with only one view but did not show fracture. Consider local mechanical trauma, bursitis, osteoarthritis. - Repeat L hip XR with multiple views to ensure no fracture - Fall precautions - Orthostatic vital signs

## 2022-09-06 NOTE — ED Triage Notes (Addendum)
From home via EMS with c/o left hip and left knee pain that onset following fall on Sunday. Patient was evaluated by EMS, refused transport at that time. Called today for increased pain. Distal CMS intact.   Of note pt is febrile and reports a productive cough x3 days

## 2022-09-06 NOTE — Assessment & Plan Note (Addendum)
Stable on 2L [place]. Appears to be satting well on this amount of O2 and can likely wean down. CXR consistent with viral PNA due to COVID though will hold off on remdesivir given overall well appearance. - S/p ceftriaxone and azithromycin x 1 in ED - Dexamethasone 6 mg again at discharge (s/p 1 dose) - Wean oxygen as tolerated, maintain oxygen saturations 88-92% - Consider DuoNebs if signs/symptoms of COPD exacerbation develop

## 2022-09-07 ENCOUNTER — Encounter (HOSPITAL_COMMUNITY): Payer: Self-pay | Admitting: Student

## 2022-09-07 ENCOUNTER — Observation Stay (HOSPITAL_COMMUNITY): Payer: Medicare Other

## 2022-09-07 DIAGNOSIS — J189 Pneumonia, unspecified organism: Secondary | ICD-10-CM | POA: Diagnosis not present

## 2022-09-07 DIAGNOSIS — J9601 Acute respiratory failure with hypoxia: Secondary | ICD-10-CM | POA: Diagnosis not present

## 2022-09-07 DIAGNOSIS — R6520 Severe sepsis without septic shock: Secondary | ICD-10-CM

## 2022-09-07 DIAGNOSIS — U071 COVID-19: Secondary | ICD-10-CM | POA: Diagnosis not present

## 2022-09-07 DIAGNOSIS — A419 Sepsis, unspecified organism: Secondary | ICD-10-CM | POA: Diagnosis not present

## 2022-09-07 DIAGNOSIS — M169 Osteoarthritis of hip, unspecified: Secondary | ICD-10-CM | POA: Insufficient documentation

## 2022-09-07 LAB — CBC WITH DIFFERENTIAL/PLATELET
Abs Immature Granulocytes: 0.1 10*3/uL — ABNORMAL HIGH (ref 0.00–0.07)
Basophils Absolute: 0 10*3/uL (ref 0.0–0.1)
Basophils Relative: 0 %
Eosinophils Absolute: 0 10*3/uL (ref 0.0–0.5)
Eosinophils Relative: 0 %
HCT: 38.5 % (ref 36.0–46.0)
Hemoglobin: 12.9 g/dL (ref 12.0–15.0)
Lymphocytes Relative: 17 %
Lymphs Abs: 0.5 10*3/uL — ABNORMAL LOW (ref 0.7–4.0)
MCH: 31.8 pg (ref 26.0–34.0)
MCHC: 33.5 g/dL (ref 30.0–36.0)
MCV: 94.8 fL (ref 80.0–100.0)
Metamyelocytes Relative: 1 %
Monocytes Absolute: 0.4 10*3/uL (ref 0.1–1.0)
Monocytes Relative: 13 %
Myelocytes: 1 %
Neutro Abs: 2.1 10*3/uL (ref 1.7–7.7)
Neutrophils Relative %: 68 %
Platelets: 71 10*3/uL — ABNORMAL LOW (ref 150–400)
RBC: 4.06 MIL/uL (ref 3.87–5.11)
RDW: 15.5 % (ref 11.5–15.5)
Smear Review: DECREASED
WBC: 3.1 10*3/uL — ABNORMAL LOW (ref 4.0–10.5)
nRBC: 0 % (ref 0.0–0.2)

## 2022-09-07 LAB — COMPREHENSIVE METABOLIC PANEL
ALT: 21 U/L (ref 0–44)
AST: 39 U/L (ref 15–41)
Albumin: 2.9 g/dL — ABNORMAL LOW (ref 3.5–5.0)
Alkaline Phosphatase: 34 U/L — ABNORMAL LOW (ref 38–126)
Anion gap: 12 (ref 5–15)
BUN: 15 mg/dL (ref 8–23)
CO2: 28 mmol/L (ref 22–32)
Calcium: 9 mg/dL (ref 8.9–10.3)
Chloride: 97 mmol/L — ABNORMAL LOW (ref 98–111)
Creatinine, Ser: 0.75 mg/dL (ref 0.44–1.00)
GFR, Estimated: >60 mL/min (ref >60)
Glucose, Bld: 170 mg/dL — ABNORMAL HIGH (ref 70–99)
Potassium: 3.3 mmol/L — ABNORMAL LOW (ref 3.5–5.1)
Sodium: 137 mmol/L (ref 135–145)
Total Bilirubin: 0.6 mg/dL (ref 0.3–1.2)
Total Protein: 6.1 g/dL — ABNORMAL LOW (ref 6.5–8.1)

## 2022-09-07 LAB — TECHNOLOGIST SMEAR REVIEW: Plt Morphology: DECREASED

## 2022-09-07 LAB — PROCALCITONIN: Procalcitonin: 0.27 ng/mL

## 2022-09-07 MED ORDER — TIMOLOL MALEATE 0.25 % OP SOLN
1.0000 [drp] | Freq: Two times a day (BID) | OPHTHALMIC | Status: DC
  Administered 2022-09-07: 1 [drp] via OPHTHALMIC
  Filled 2022-09-07: qty 5

## 2022-09-07 MED ORDER — ENOXAPARIN SODIUM 40 MG/0.4ML IJ SOSY: 40.0000 mg | PREFILLED_SYRINGE | INTRAMUSCULAR | Status: DC

## 2022-09-07 MED ORDER — LEVOTHYROXINE SODIUM 50 MCG PO TABS
50.0000 ug | ORAL_TABLET | Freq: Every day | ORAL | Status: DC
  Filled 2022-09-07: qty 1

## 2022-09-07 MED ORDER — POLYVINYL ALCOHOL 1.4 % OP SOLN: 1.0000 [drp] | OPHTHALMIC | Status: DC | PRN

## 2022-09-07 MED ORDER — ACETAMINOPHEN 325 MG PO TABS: 650.0000 mg | ORAL_TABLET | Freq: Four times a day (QID) | ORAL | Status: DC | PRN

## 2022-09-07 NOTE — Discharge Instructions (Addendum)
Dear Larita Fife,  Thank you for letting us participate in your care. You were hospitalized for lower oxygen saturations and hip pain and diagnosed with COVID-19 pneumonia. You were treated with antibiotics and oxygen supplementation. You were off oxygen and improved by the next day. Your x-rays of your hips did not show any fractures but did show arthritis. We would like for you to follow up with your PCP about your breathing and your hip pain within the next week.   POST-HOSPITAL & CARE INSTRUCTIONS Slowly go back to your normal routine and be sure to stay hydrated. Be sure to come back should your breathing get worse, your hip pain worsens, or you overall feel worse. Go to your follow up appointments (listed below)  DOCTOR'S APPOINTMENT   Future Appointments  Date Time Provider [place]  09/14/2022 10:00 AM [facility] None  09/14/2022 10:40 AM Orson Slick, MD [facility] None  09/19/2022  9:45 AM Bernarda Caffey, MD [facility] None  02/07/2023 10:45 AM Tat, Eustace Quail, DO [facility] None    Follow-up Information     [facility] Follow up.   Why: Please call the Clinic at [phone] to make a follow up appointment in the next week.        [facility] Follow up.   Specialty: Home Health Services Why: Alvis Lemmings will be providing you with therapy at your home.  They will call you in the next 24-48 hours to set up services. Contact information: [address] [place] [address] [phone]         [facility] Follow up.   Why: [facility] will provide you with a rolling walker today before you are discharged home. Contact information: [place] [address] [phone]                 Take care and be well!  [facility]  [place], [place] [address] [phone]

## 2022-09-07 NOTE — Hospital Course (Addendum)
Kayla Hurley is an 88 year-old female who presented with left hip pain and cough admitted to [facility] after a fall and for oxygen requirement in the setting of COVID-19. PMH significant for COPD w/ asthma (not on home O2; follows with [facility]), Macular degeneration, HTN, RA on methotrexate, Osteoporosis, Hypothyroidism, Meningioma s/p resection, Essential tremor.  COVID-19 pneumonia Cough, fever reported on 4/2. Afebrile with stable VS throughout admission. Arrived with SpO2 88% on room air in ED, placed on 2 L [place] (although goal 88-92% given COPD diagnosis). CXR showed airspace disease in left mid lower lung suspicious for pneumonia. Initially treated with Azithromycin x1 and CTX x1, but discontinued given likely viral etiology. Received 2 doses of Dexamethasone 6 mg. Not placed on remdesivir given overall comfortable appearance. By discharge, she was on RA, satting well with ambulation, and feeling overall improved.  Left hip pain, OA Reported fall from chair 2 days prior to admission. Pelvis x-rays showed bilateral (L>R) hip degenerative changes, but no acute osseous abnormalities. Pain likely 2/2 localized mechanical trauma with overlying OA. By discharge, she was able to ambulate well with PT/OT.  Thrombocytopenia Plts 66>62. Waning around 150s for at least last 4 years. Blood smear with decreased plt number and mild left shift WBCs. Most likely etiology viral suppression. At discharge, she remained asymptomatic without any apparent bleeding.  Chronic and stable medical conditions: Hypothyroid: continued synthroid 50 mcg daily Rheumatoid arthritis: once-weekly methotrexate taken on TH and leucovorin taken on Fri before admission so no redosing over admission  Issues for follow up: Ensure continued clinical improvement Assess L hip pain and impact on daily functioning in setting of HHPT Follow up platelets after viral illness

## 2022-09-07 NOTE — Progress Notes (Cosign Needed)
    Durable Medical Equipment  (From admission, onward)           Start     Ordered   09/07/22 1527  For home use only DME Walker rolling  Once       Question Answer Comment  Walker: With 5 Inch Wheels   Patient needs a walker to treat with the following condition Generalized weakness      09/07/22 1526

## 2022-09-07 NOTE — TOC Initial Note (Signed)
Transition of Care [facility]) - Initial/Assessment Note    Patient Details  Name: Kayla Hurley MRN: [id number] Date of Birth: [birth year]  [facility] CM/SW Contact:    Curlene Labrum, RN Phone Number: 09/07/2022, 3:46 PM  Clinical Narrative:                 CM met with the patient and sister at the bedside to discuss TOC needs for home.  The patient was admitting for SOB and coughing.  The patient lives alone and plans to return to the home today.  The patient has a RW at the home.  The patient was provided with Medicare choice regarding home health and the patient does not have a preference.  I called Tommi Rumps, CM with [facility] and accepted for services.  The patient was attending Outpatient PT at [facility] and plans to return there once she receives home health for a few weeks and is able to drive to her therapy sessions.  PCP - The patient was currently seeing [facility] on [address] in [place], [place].   I called [employer] and ordered a RW to be delivered to the patient's bedside prior to patient's discharge to home today.  The patient's sister will be providing transportation to home today by car.  Expected Discharge Plan: [place] Barriers to Discharge: No Barriers Identified   Patient Goals and CMS Choice Patient states their goals for this hospitalization and ongoing recovery are:: To return home CMS Medicare.gov Compare Post Acute Care list provided to:: Patient Choice offered to / list presented to : Patient [place] ownership interest in [facility].provided to:: Patient    Expected Discharge Plan and Services   Discharge Planning Services: CM Consult Post Acute Care Choice: [place] arrangements for the past 2 months: [place]                 DME Arranged: Walker rolling DME Agency: [facility] Date DME Agency Contacted: 09/07/22 Time DME Agency Contacted:  [id number] Representative spoke with at DME Agency: Cyril Mourning, [place] with [facility] HH Arranged: PT, OT [place] Agency: [place] Date [place]: 09/07/22 Time [place]: 1544 Representative spoke with at [place]: Meredeth Ide, [place]  accepted for PT/OT services for home health  Prior Living Arrangements/Services Living arrangements for the past 2 months: Single Family Home Lives with:: Self Patient language and need for interpreter reviewed:: Yes Do you feel safe going back to the place where you live?: Yes      Need for Family Participation in Patient Care: Yes (Comment) Care giver support system in place?: Yes (comment) Current home services: DME Kasandra Knudsen) Criminal Activity/Legal Involvement Pertinent to Current Situation/Hospitalization: No - Comment as needed  Activities of Daily Living Home Assistive Devices/Equipment: None ADL Screening (condition at time of admission) Patient's cognitive ability adequate to safely complete daily activities?: Yes Is the patient deaf or have difficulty hearing?: Yes Does the patient have difficulty seeing, even when wearing glasses/contacts?: No Does the patient have difficulty concentrating, remembering, or making decisions?: No Patient able to express need for assistance with ADLs?: No Does the patient have difficulty dressing or bathing?: No Independently performs ADLs?: Yes (appropriate for developmental age) Does the patient have difficulty walking or climbing stairs?: Yes Weakness of Legs: Both Weakness of Arms/Hands: None  Permission Sought/Granted Permission sought to share information with : Case Manager, Family Supports, Engineer, manufacturing  Representative Permission granted to share information with : Yes, Verbal Permission Granted     Permission granted to share info w AGENCY: [facility] agency  Permission granted to share info w Relationship: sister - Ileene Rubens [id number]     Emotional  Assessment Appearance:: Appears stated age Attitude/Demeanor/Rapport: Gracious Affect (typically observed): Accepting Orientation: : Oriented to Self, Oriented to Place, Oriented to  Time, Oriented to Situation Alcohol / Substance Use: Not Applicable Psych Involvement: No (comment)  Admission diagnosis:  Thrombocytopenia [D69.6] Acute respiratory failure due to COVID-19 [U07.1, J96.00] COVID [U07.1] Acute hypoxic respiratory failure [J96.01] COVID-19 virus infection [U07.1] Patient Active Problem List   Diagnosis Date Noted   Severe sepsis 09/07/2022   COVID 09/07/2022   COVID-19 virus infection 09/07/2022   OA (osteoarthritis) of hip 09/07/2022   Thrombocytopenia 09/06/2022   Fall 09/06/2022   COVID-19 09/06/2022   Chronic rhinitis 06/02/2022   CAP (community acquired pneumonia) 04/19/2022   COPD with asthma 08/26/2021   Chronic cough 08/26/2021   Essential tremor 11/19/2020   PCP:  Pcp, No Pharmacy:   [facility], [place] [address] [place] [address] Phone: [phone] Fax: [phone]     Social Determinants of Health (SDOH) Social History: [place]: No Food Insecurity (09/07/2022)  Housing: Low Risk  (09/07/2022)  Transportation Needs: No Transportation Needs (09/07/2022)  Utilities: Not At Risk (09/07/2022)  Tobacco Use: Medium Risk (09/07/2022)   SDOH Interventions: Housing Interventions: Intervention Not Indicated   Readmission Risk Interventions     No data to display

## 2022-09-07 NOTE — Discharge Summary (Cosign Needed Addendum)
[facility] Discharge Summary  Patient name: Kayla Hurley Medical record number: [id number] Date of birth: 06/24/1934 Age: 88 y.o. Gender: female Date of Admission: 09/06/2022  Date of Discharge: 09/07/2022 Admitting Physician: Jacelyn Grip, MD  Primary Care Provider: Pcp, No Consultants: None  Indication for Hospitalization: COVID-19 pneumonia  Brief Hospital Course:  Kayla Hurley is an 88 year-old female who presented with left hip pain and cough admitted to [place] after a fall and for oxygen requirement in the setting of COVID-19. PMH significant for COPD w/ asthma (not on home O2; follows with [facility]), Macular degeneration, HTN, RA on methotrexate, Osteoporosis, Hypothyroidism, Meningioma s/p resection, Essential tremor.  COVID-19 pneumonia Cough, fever reported on 4/2. Afebrile with stable VS throughout admission. Arrived with SpO2 88% on room air in ED, placed on 2 L [place] (although goal 88-92% given COPD diagnosis). CXR showed airspace disease in left mid lower lung suspicious for pneumonia. Initially treated with Azithromycin x1 and CTX x1, but discontinued given likely viral etiology. Received 2 doses of Dexamethasone 6 mg. Not placed on remdesivir given overall comfortable appearance. By discharge, she was on RA, satting well with ambulation, and feeling overall improved.  Left hip pain, OA Reported fall from chair 2 days prior to admission. Pelvis x-rays showed bilateral (L>R) hip degenerative changes, but no acute osseous abnormalities. Pain likely 2/2 localized mechanical trauma with overlying OA. By discharge, she was able to ambulate well with PT/OT.  Thrombocytopenia Plts 66>62. Waning around 150s for at least last 4 years. Blood smear with decreased plt number and mild left shift WBCs. Most likely etiology viral suppression. At discharge, she remained asymptomatic without any apparent bleeding.  Chronic  and stable medical conditions: Hypothyroid: continued synthroid 50 mcg daily Rheumatoid arthritis: once-weekly methotrexate taken on TH and leucovorin taken on Fri before admission so no redosing over admission  Issues for follow up: Ensure continued clinical improvement Assess L hip pain and impact on daily functioning in setting of HHPT Follow up platelets after viral illness  Discharge Diagnoses/Problem List:  Principal Problem for Admission: Oxygen requirement in setting of COVID-19 Other Problems addressed during stay:  Left hip pain after fall  Disposition: Home  Discharge Condition: Stable  Discharge Exam:  Blood pressure (!) 108/56, pulse (!) 59, temperature (!) 97.5 F (36.4 C), resp. rate 15, height 5\' 2"  (1.575 m), weight 61.2 kg, SpO2 94 %.  General: Alert and oriented, in NAD Skin: Warm, dry, and intact without lesions HEENT: NCAT, EOM grossly normal, midline nasal septum Cardiac: RRR, no m/r/g appreciated Respiratory: Diminished sounds in bilateral bases with coarse sounds in LLL, breathing and speaking comfortably on  Extremities: Moves all extremities grossly equally, pain with passive movement of L hip/sitting up, very mild TTP overlying L trochanter Neurological: No gross focal deficit Psychiatric: Appropriate mood and affect   Significant Labs and Imaging:  Recent Labs  Lab 09/06/22 1531 09/06/22 1915 09/07/22 1234  WBC 4.7 4.3 3.1*  HGB 12.7 11.3* 12.9  HCT 38.8 33.2* 38.5  PLT 66* 62* 71*   Recent Labs  Lab 09/06/22 1531 09/07/22 1234  NA 136 137  K 3.2* 3.3*  CL 99 97*  CO2 28 28  GLUCOSE 104* 170*  BUN 8 15  CREATININE 0.82 0.75  CALCIUM 8.9 9.0  ALKPHOS 37* 34*  AST 45* 39  ALT 19 21  ALBUMIN 3.3* 2.9*   DG HIP UNILAT WITH PELVIS 2-3 VIEWS LEFT  Result Date:  09/07/2022 CLINICAL DATA:  Hip pain. EXAM: DG HIP (WITH OR WITHOUT PELVIS) 2-3V LEFT COMPARISON:  AP pelvis 09/06/2022 FINDINGS: Frontal view of the pelvis and frontal and  lateral views of the left hip. The bilateral sacroiliac joint spaces are maintained. Mild pubic symphysis joint space narrowing. Moderate bilateral superomedial femoroacetabular joint space narrowing. Mild left femoral head-neck junction circumferential degenerative spurs. Moderate left and mild right superolateral acetabular degenerative osteophytes. Small well corticated chronic ossicle lateral to the superior left acetabulum. No acute fracture or dislocation. Mild-to-moderate atherosclerotic calcifications. 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 Electronically Signed   By: Yvonne Kendall M.D.   On: 09/07/2022 12:34   CT Lumbar Spine Wo Contrast  Result Date: 09/06/2022 CLINICAL DATA:  [place].  Back pain. EXAM: CT LUMBAR SPINE WITHOUT CONTRAST TECHNIQUE: Multidetector CT imaging of the lumbar spine was performed without intravenous contrast administration. Multiplanar CT image reconstructions were also generated. RADIATION DOSE REDUCTION: This exam was performed according to the departmental dose-optimization program which includes automated exposure control, adjustment of the mA and/or kV according to patient size and/or use of iterative reconstruction technique. COMPARISON:  None Available. FINDINGS: Segmentation: There are five lumbar type vertebral bodies. The last full intervertebral disc space is labeled L5-S1. Alignment: Normal Vertebrae: Age related osteoporosis but no acute lumbar spine fracture. No bone lesions. Paraspinal and other soft tissues: No significant paraspinal retroperitoneal findings. There is tortuosity and advanced calcification of the abdominal aorta and iliac arteries but no aneurysm. No retroperitoneal adenopathy. The visualized kidneys are grossly normal. No retroperitoneal hematoma. Disc levels: L1-2: Bulging annulus and mild facet disease contributing to mild spinal and bilateral lateral recess stenosis. L2-3: Diffuse bulging annulus, moderate  facet disease and ligamentum flavum thickening contributing to he moderately severe spinal and bilateral lateral recess stenosis. No significant foraminal stenosis. L3-4: Diffuse bulging degenerated annulus, advanced facet disease and ligamentum flavum thickening contributing to severe spinal and bilateral lateral recess stenosis. No significant foraminal stenosis. L4-5: Bulging annulus, advanced facet disease and ligamentum flavum thickening contributing to moderate spinal and bilateral lateral recess stenosis. L5-S1: Bulging annulus and advanced facet disease without significant spinal or foraminal stenosis. IMPRESSION: 1. Normal alignment and no acute lumbar spine fracture. 2. Age related osteoporosis but no bone lesions. 3. Moderately severe spinal and bilateral lateral recess stenosis at L2-3 and L3-4. 4. Moderate spinal and bilateral lateral recess stenosis at L4-5. 5. Aortic atherosclerosis. Aortic Atherosclerosis (ICD10-I70.0). Electronically Signed   By: Marijo Sanes M.D.   On: 09/06/2022 18:36   CT Head Wo Contrast  Result Date: 09/06/2022 CLINICAL DATA:  [place].  Hit head. EXAM: CT HEAD WITHOUT CONTRAST CT CERVICAL SPINE WITHOUT CONTRAST TECHNIQUE: Multidetector CT imaging of the head and cervical spine was performed following the standard protocol without intravenous contrast. Multiplanar CT image reconstructions of the cervical spine were also generated. RADIATION DOSE REDUCTION: This exam was performed according to the departmental dose-optimization program which includes automated exposure control, adjustment of the mA and/or kV according to patient size and/or use of iterative reconstruction technique. COMPARISON:  None Available. FINDINGS: CT HEAD FINDINGS Brain: Age related cerebral atrophy, ventriculomegaly and periventricular white matter disease. Moderate-sized area encephalomalacia at the right vertex and evidence of prior craniotomy. History of meningioma resection. No acute intracranial  findings or mass lesions. No extra-axial fluid collections are identified. The brainstem cerebellum grossly. Scattered benign basal ganglia calcifications. Vascular: Moderate vascular calcifications but no aneurysm or hyperdense vessels. Skull: Remote craniotomy at the vertex.  No acute skull  fracture. Sinuses/Orbits: Sign scattered ethmoid and maxillary sinus disease. The mastoid air cells and middle ear cavities are clear. The globes are intact. Other: No scalp lesions or scalp hematoma. CT CERVICAL SPINE FINDINGS Alignment: Normal Skull base and vertebrae: No acute fracture. No primary bone lesion or focal pathologic process. Soft tissues and spinal canal: No prevertebral fluid or swelling. No visible canal hematoma. Disc levels: The spinal canal is fairly generous. No large disc protrusions or canal stenosis. Mild multilevel bony foraminal stenosis due to uncinate spurring and facet disease. Upper chest: The lung apices are grossly clear. Other: Bilateral carotid artery calcifications. IMPRESSION: 1. Age related cerebral atrophy, ventriculomegaly and periventricular white matter disease. 2. Moderate-sized area of encephalomalacia at the right vertex and evidence of prior craniotomy. History of meningioma resection. 3. No acute intracranial findings or skull fracture. 4. Normal alignment of the cervical spine without acute fracture. Electronically Signed   By: Marijo Sanes M.D.   On: 09/06/2022 18:31   CT Cervical Spine Wo Contrast  Result Date: 09/06/2022 CLINICAL DATA:  [place].  Hit head. EXAM: CT HEAD WITHOUT CONTRAST CT CERVICAL SPINE WITHOUT CONTRAST TECHNIQUE: Multidetector CT imaging of the head and cervical spine was performed following the standard protocol without intravenous contrast. Multiplanar CT image reconstructions of the cervical spine were also generated. RADIATION DOSE REDUCTION: This exam was performed according to the departmental dose-optimization program which includes automated exposure  control, adjustment of the mA and/or kV according to patient size and/or use of iterative reconstruction technique. COMPARISON:  None Available. FINDINGS: CT HEAD FINDINGS Brain: Age related cerebral atrophy, ventriculomegaly and periventricular white matter disease. Moderate-sized area encephalomalacia at the right vertex and evidence of prior craniotomy. History of meningioma resection. No acute intracranial findings or mass lesions. No extra-axial fluid collections are identified. The brainstem cerebellum grossly. Scattered benign basal ganglia calcifications. Vascular: Moderate vascular calcifications but no aneurysm or hyperdense vessels. Skull: Remote craniotomy at the vertex.  No acute skull fracture. Sinuses/Orbits: Sign scattered ethmoid and maxillary sinus disease. The mastoid air cells and middle ear cavities are clear. The globes are intact. Other: No scalp lesions or scalp hematoma. CT CERVICAL SPINE FINDINGS Alignment: Normal Skull base and vertebrae: No acute fracture. No primary bone lesion or focal pathologic process. Soft tissues and spinal canal: No prevertebral fluid or swelling. No visible canal hematoma. Disc levels: The spinal canal is fairly generous. No large disc protrusions or canal stenosis. Mild multilevel bony foraminal stenosis due to uncinate spurring and facet disease. Upper chest: The lung apices are grossly clear. Other: Bilateral carotid artery calcifications. IMPRESSION: 1. Age related cerebral atrophy, ventriculomegaly and periventricular white matter disease. 2. Moderate-sized area of encephalomalacia at the right vertex and evidence of prior craniotomy. History of meningioma resection. 3. No acute intracranial findings or skull fracture. 4. Normal alignment of the cervical spine without acute fracture. Electronically Signed   By: Marijo Sanes M.D.   On: 09/06/2022 18:31   DG Pelvis 1-2 Views  Result Date: 09/06/2022 CLINICAL DATA:  Pain status post fall EXAM: PELVIS - 1  VIEW COMPARISON:  None Available. FINDINGS: Bilateral hip degenerative changes with joint space narrowing and osteophytes. Lumbosacral degenerative changes. No acute fracture, dislocation or subluxation. IMPRESSION: Bilateral hip degenerative changes. No acute osseous abnormalities identified. Electronically Signed   By: Sammie Bench M.D.   On: 09/06/2022 17:45   DG Chest Port 1 View  Result Date: 09/06/2022 CLINICAL DATA:  Possible sepsis EXAM: PORTABLE CHEST 1 VIEW COMPARISON:  06/02/2022 FINDINGS:  Right lung is grossly clear. Heterogeneous airspace disease in the left mid to lower lung. Normal cardiac size. Aortic atherosclerosis. IMPRESSION: Heterogeneous airspace disease in the left mid to lower lung, suspicious for pneumonia. Radiographic follow-up to resolution recommended. Electronically Signed   By: Donavan Foil M.D.   On: 09/06/2022 16:42     Discharge Medications:  Allergies as of 09/07/2022       Reactions   Adhesive [tape] Other (See Comments)   blisters   Penicillins Diarrhea, Nausea And Vomiting, Rash        Medication List     STOP taking these medications    ALPRAZolam 0.25 MG tablet Commonly known as: XANAX   levalbuterol 45 MCG/ACT inhaler Commonly known as: XOPENEX HFA   nicotine polacrilex 2 MG lozenge Commonly known as: COMMIT   propranolol 10 MG tablet Commonly known as: INDERAL       TAKE these medications    acetaminophen 325 MG tablet Commonly known as: TYLENOL Take 2 tablets (650 mg total) by mouth every 6 (six) hours as needed for mild pain (or Fever >/= 101).   Citracal +D3 250-107-500 MG-MG-UNIT Chew Generic drug: Calcium-Phosphorus-Vitamin D See admin instructions.   clobetasol 0.05 % external solution Commonly known as: TEMOVATE Apply topically 2 (two) times daily as needed.   diclofenac Sodium 1 % Gel Commonly known as: VOLTAREN Apply topically.   fluticasone 0.05 % cream Commonly known as: CUTIVATE Apply topically 2 (two)  times daily as needed.   folic acid 1 MG tablet Commonly known as: FOLVITE Take 1 mg by mouth daily. Take two tablets daily   gabapentin 100 MG capsule Commonly known as: Neurontin Take 1 capsule (100 mg total) by mouth at bedtime.   hydrochlorothiazide 25 MG tablet Commonly known as: HYDRODIURIL Take 25 mg by mouth daily.   ketoconazole 2 % cream Commonly known as: NIZORAL SMARTSIG:Sparingly Topical Daily   leucovorin 5 MG tablet Commonly known as: WELLCOVORIN TAKE 2 TABLETS BY MOUTH ONCE A WEEK   levothyroxine 50 MCG tablet Commonly known as: SYNTHROID Take 50 mcg by mouth daily before breakfast.   methotrexate 2.5 MG tablet Commonly known as: RHEUMATREX Take 10 mg by mouth once a week. Caution:Chemotherapy. Protect from light.   potassium chloride 20 MEQ packet Commonly known as: KLOR-CON Take 20 mEq by mouth 2 (two) times daily.   PreserVision AREDS 2+Multi Vit Caps See admin instructions.   primidone 50 MG tablet Commonly known as: MYSOLINE TAKE 1 TABLET BY MOUTH AT BEDTIME   salsalate 750 MG tablet Commonly known as: DISALCID Take 2 tablets by mouth 2 (two) times daily.   SYSTANE BALANCE OP Apply to eye.   timolol 0.25 % ophthalmic solution Commonly known as: BETIMOL Place 1 drop into both eyes 2 (two) times daily.               Durable Medical Equipment  (From admission, onward)           Start     Ordered   09/07/22 1535  For home use only DME Walker rolling  Once       Question Answer Comment  Walker: With 5 Inch Wheels   Patient needs a walker to treat with the following condition Generalized weakness      09/07/22 1534           Discharge Instructions: Please refer to Patient Instructions section of EMR for full details.  Patient was counseled important signs and symptoms that should prompt return  to medical care, changes in medications, dietary instructions, activity restrictions, and follow up appointments.   Follow-Up  Appointments:  Follow-up Information     [facility] Follow up.   Why: Please call the Clinic at [phone] to make a follow up appointment in the next week.               I personally reviewed medical decision making and verified the service and findings are accurately documented in the intern's note.  Precious Gilding, DO 09/07/2022 5:37 PM   Ethelene Hal, MD 09/07/2022, 3:38 PM PGY-1, [place]

## 2022-09-07 NOTE — Progress Notes (Signed)
Daily Progress Note Intern Pager: [phone]  Patient name: Kayla Hurley Medical record number: [id number] Date of birth: June 14, 1934 Age: 88 y.o. Gender: female  Primary Care Provider: Pcp, No Consultants: None Code Status: FULL  Pt Overview and Major Events to Date:  4/2 - admitted  Assessment and Plan:  Kayla Hurley is a 88 y.o. female who presented with left hip pain after a fall and found to have COVID-19. Pertinent PMH/PSH includes T2DM, COPD not on home O2, macular degeneration, HTN, RA on methotrexate, osteoporosis, hypothyroidism, meningioma s/p resection, and essential tremor.   COVID-19 Stable on 2L [place]. Appears to be satting well on this amount of O2 and can likely wean down. CXR consistent with viral PNA due to COVID though will hold off on remdesivir given overall well appearance. - S/p ceftriaxone and azithromycin x 1 in ED - Dexamethasone 6 mg again at discharge (s/p 1 dose) - Wean oxygen as tolerated, maintain oxygen saturations 88-92% - Consider DuoNebs if signs/symptoms of COPD exacerbation develop  Fall L hip pain persistent and with movement>palpation. XR with only one view but did not show fracture. Consider local mechanical trauma, bursitis, osteoarthritis. - Repeat L hip XR with multiple views to ensure no fracture - Fall precautions - Orthostatic vital signs  Thrombocytopenia Platelets of 62. Plts have been waning around 150k for last 4 years, at least. Most likely viral suppression. Less likely myelosuppression given stable other cell lines or vitamin B12 deficiency given normal MCV. - AM CBC - Fu blood smear  Chronic and stable medical conditions: Hypothyroid: took synthroid 4/2 AM. Reorder 50 mcg today. Rheumatoid arthritis: Methotrexate on TH and Leucovorin on Fri. No need for redose at this time.  FEN/GI: Regular diet PPx: Lovenox Dispo:Pending PT recommendations  pending clinical improvement .  Subjective:  Feeling better this morning.  Still having hip pain when moving and with mild palpation.  Objective: Temp:  [97.5 F (36.4 C)-103.6 F (39.8 C)] 97.5 F (36.4 C) (04/03 0742) Pulse Rate:  [59-96] 59 (04/03 0742) Resp:  [15-22] 15 (04/03 0742) BP: (108-139)/(50-117) 108/56 (04/03 0742) SpO2:  [88 %-100 %] 94 % (04/03 0742) Weight:  [61.2 kg] 61.2 kg (04/02 1503) Physical Exam: General: Alert and oriented, in NAD Skin: Warm, dry, and intact without lesions HEENT: NCAT, EOM grossly normal, midline nasal septum Cardiac: RRR, no m/r/g appreciated Respiratory: Diminished sounds in bilateral bases with coarse sounds in LLL, breathing and speaking comfortably on [place] Extremities: Moves all extremities grossly equally, pain with passive movement of L hip/sitting up, very mild TTP overlying L trochanter Neurological: No gross focal deficit Psychiatric: Appropriate mood and affect   Laboratory: Most recent CBC Lab Results  Component Value Date   WBC 4.3 09/06/2022   HGB 11.3 (L) 09/06/2022   HCT 33.2 (L) 09/06/2022   MCV 94.6 09/06/2022   PLT 62 (L) 09/06/2022   Most recent BMP    Latest Ref Rng & Units 09/06/2022    3:31 PM  BMP  Glucose 70 - 99 mg/dL 104   BUN 8 - 23 mg/dL 8   Creatinine 0.44 - 1.00 mg/dL 0.82   Sodium 135 - 145 mmol/L 136   Potassium 3.5 - 5.1 mmol/L 3.2   Chloride 98 - 111 mmol/L 99   CO2 22 - 32 mmol/L 28   Calcium 8.9 - 10.3 mg/dL 8.9    Ethelene Hal, MD 09/07/2022, 11:46 AM PGY-1, [place] Intern pager: [phone], text pages welcome Secure chat  group [facility] Teaching Service

## 2022-09-07 NOTE — Evaluation (Signed)
Occupational Therapy Evaluation Patient Details Name: Kayla Hurley MRN: [id number] DOB: [birth year] Today's Date: 09/07/2022   History of Present Illness 88 yo female with onset of fall at home with cough for last few days starting prior to holiday.  Positive for Covid on 4/2 and now has PNA from susp Covid and community exposures.  Was in hypoxia at admission with thrombocytopenia, all from Covid.  Has L hip and knee pain with no acute fractures post fall.  PMHx:  COPD, neuropathy, hypothyroidism, rheumatoid arthritis on methotrexate, osteoporosis, hypertension, essential tremor, glaucoma, BPPV   Clinical Impression   Kayla Hurley was evaluated s/p the above admission list. She is indep and lives alone at baseline. Upon evaluation she was limited by L hip pain, decreased activity tolerance and generalized weakness. Overall she requires min A fro bed mobility and up to min G for mobility with RW. Due to the deficits listed below she also requires up to min G for LB ADLs with increased time for pain management. VSS on RA, SpO2 >92%. Pt will benefit from continued acute OT services. Pt will benefit from continued OT in pt's natural environment at discharge.  Orthostatic BPs Supine 112/64  Sitting 120/66  Standing 105/53  Standing after mobility and ADLs 115/67         Recommendations for follow up therapy are one component of a multi-disciplinary discharge planning process, led by the attending physician.  Recommendations may be updated based on patient status, additional functional criteria and insurance authorization.   Assistance Recommended at Discharge Intermittent Supervision/Assistance  Patient can return home with the following A little help with walking and/or transfers;A little help with bathing/dressing/bathroom;Assistance with cooking/housework;Assist for transportation;Help with stairs or ramp for entrance    Functional Status Assessment  Patient has had a recent decline in their  functional status and demonstrates the ability to make significant improvements in function in a reasonable and predictable amount of time.  Equipment Recommendations  None recommended by OT       Precautions / Restrictions Precautions Precautions: Fall Restrictions Weight Bearing Restrictions: No      Mobility Bed Mobility Overal bed mobility: Needs Assistance Bed Mobility: Supine to Sit     Supine to sit: Min assist     General bed mobility comments: due to L hip pain    Transfers Overall transfer level: Needs assistance Equipment used: Rolling walker (2 wheels) Transfers: Sit to/from Stand Sit to Stand: Min guard           General transfer comment: from bed, chair and toilet      Balance Overall balance assessment: Mild deficits observed, not formally tested                                         ADL either performed or assessed with clinical judgement   ADL Overall ADL's : At baseline;Needs assistance/impaired Eating/Feeding: Independent   Grooming: Supervision/safety;Standing Grooming Details (indicate cue type and reason): at the sink Upper Body Bathing: Set up;Sitting   Lower Body Bathing: Min guard;Sit to/from stand   Upper Body Dressing : Set up;Sitting   Lower Body Dressing: Min guard;Sit to/from stand   Toilet Transfer: Min guard;Ambulation;Rolling walker (2 wheels)   Toileting- Clothing Manipulation and Hygiene: Supervision/safety;Sitting/lateral lean       Functional mobility during ADLs: Min guard;Rolling walker (2 wheels) General ADL Comments: minimal cues for RW management  Vision Baseline Vision/History: 1 Wears glasses Vision Assessment?: No apparent visual deficits     Perception Perception Perception Tested?: No   Praxis Praxis Praxis tested?: Not tested    Pertinent Vitals/Pain Pain Assessment Pain Assessment: Faces Faces Pain Scale: Hurts little more Pain Location: L hip with mobility Pain  Descriptors / Indicators: Discomfort, Guarding Pain Intervention(s): Monitored during session, Limited activity within patient's tolerance     Hand Dominance Right   Extremity/Trunk Assessment Upper Extremity Assessment Upper Extremity Assessment: Generalized weakness   Lower Extremity Assessment Lower Extremity Assessment: Defer to PT evaluation   Cervical / Trunk Assessment Cervical / Trunk Assessment: Normal   Communication Communication Communication: HOH   Cognition Arousal/Alertness: Awake/alert Behavior During Therapy: WFL for tasks assessed/performed Overall Cognitive Status: Within Functional Limits for tasks assessed                                       General Comments  VSS on RA - negative orthostatics            Home Living Family/patient expects to be discharged to:: Private residence Living Arrangements: Alone Available Help at Discharge: Family;Friend(s);Available PRN/intermittently Type of Home: House Home Access: Stairs to enter [employer] of Steps: 1 Entrance Stairs-Rails: Right Home Layout: One level     Bathroom Shower/Tub: Teacher, early years/pre: Standard     Home Equipment: Cane - single point;Grab bars - tub/shower   Additional Comments: 2 dogs at home      Prior Functioning/Environment Prior Level of Function : Independent/Modified Independent             Mobility Comments: driving at baseline living alone          OT Problem List: Decreased strength;Decreased range of motion;Decreased activity tolerance;Impaired balance (sitting and/or standing);Decreased safety awareness;Decreased knowledge of use of DME or AE;Decreased knowledge of precautions;Pain      OT Treatment/Interventions: Self-care/ADL training;Therapeutic exercise;DME and/or AE instruction;Therapeutic activities;Patient/family education;Balance training    OT Goals(Current goals can be found in the care plan section)  Acute Rehab OT Goals Patient Stated Goal: home OT Goal Formulation: With patient Time For Goal Achievement: 09/21/22 Potential to Achieve Goals: Good ADL Goals Additional ADL Goal #1: Pt will complete all BADLs with mod I  OT Frequency: Min 2X/week    Co-evaluation PT/OT/SLP Co-Evaluation/Treatment: Yes Reason for Co-Treatment: Complexity of the patient's impairments (multi-system involvement);For patient/therapist safety;To address functional/ADL transfers   OT goals addressed during session: ADL's and self-care      AM-PAC OT "6 Clicks" Daily Activity     Outcome Measure Help from another person eating meals?: None Help from another person taking care of personal grooming?: A Little Help from another person toileting, which includes using toliet, bedpan, or urinal?: A Little Help from another person bathing (including washing, rinsing, drying)?: A Little Help from another person to put on and taking off regular upper body clothing?: A Little Help from another person to put on and taking off regular lower body clothing?: A Little 6 Click Score: 19   End of Session Equipment Utilized During Treatment: Rolling walker (2 wheels) Nurse Communication: Mobility status  Activity Tolerance: Patient tolerated treatment well Patient left: in chair;with call bell/phone within reach;with chair alarm set  OT Visit Diagnosis: Unsteadiness on feet (R26.81);Other abnormalities of gait and mobility (R26.89);Muscle weakness (generalized) (M62.81);History of falling (Z91.81);Pain  Time: [id number] OT Time Calculation (min): 32 min Charges:  OT General Charges $OT Visit: 1 Visit OT Evaluation $OT Eval Moderate Complexity: 1 Mod  Shade Flood, OTR/L Acute Rehabilitation Services Office [phone] Secure Chat Communication Preferred   Elliot Cousin 09/07/2022, 11:35 AM

## 2022-09-07 NOTE — Progress Notes (Signed)
Rolling walker delivered to pts room at this time.

## 2022-09-07 NOTE — Evaluation (Signed)
Physical Therapy Evaluation Patient Details Name: Kayla Hurley MRN: [id number] DOB: 1935-02-16 Today's Date: 09/07/2022  History of Present Illness  88 yo female with onset of fall at home with cough for last few days starting prior to holiday.  Positive for Covid on 4/2 and now has PNA from susp Covid and community exposures.  Was in hypoxia at admission with thrombocytopenia, all from Covid.  Has L hip and knee pain with no acute fractures post fall.  PMHx:  COPD, neuropathy, hypothyroidism, rheumatoid arthritis on methotrexate, osteoporosis, hypertension, essential tremor, glaucoma, BPPV  Clinical Impression  Pt was seen for mobility on RW with help to maneuver into and out of BR, minimal intervention in which pt was able to control walker with use for L hip pain and balance.  Sats were controlled on room air, with no lower reading observed than 90%, as well as 97% post gait sitting in chair.  BP was supine 112/64, sitting 120/66, standing 105/53 and post walk was 115/67.  Not symptomatic after walking and moving so will recommend her to go home with HHPT follow up and will share findings with MD.  Pt is up in chair with chair alarm on at end of session, and will encourage mobility with staff including mobility techs.  Pt also has a significant back history, and will benefit from mobility as well for this purpose.  Follow acute PT goals as outlined below.   Recommendations for follow up therapy are one component of a multi-disciplinary discharge planning process, led by the attending physician.  Recommendations may be updated based on patient status, additional functional criteria and insurance authorization.  Follow Up Recommendations       Assistance Recommended at Discharge Intermittent Supervision/Assistance  Patient can return home with the following  A little help with bathing/dressing/bathroom;Assistance with cooking/housework;Assist for transportation    Equipment Recommendations  Rolling walker (2 wheels)  Recommendations for Other Services       Functional Status Assessment Patient has had a recent decline in their functional status and demonstrates the ability to make significant improvements in function in a reasonable and predictable amount of time.     Precautions / Restrictions Precautions Precautions: Fall Restrictions Weight Bearing Restrictions: No      Mobility  Bed Mobility Overal bed mobility: Needs Assistance Bed Mobility: Supine to Sit     Supine to sit: Min assist     General bed mobility comments: min assist to support trunk and assist final scooting to EOB to test vitals    Transfers Overall transfer level: Needs assistance Equipment used: Rolling walker (2 wheels) Transfers: Sit to/from Stand Sit to Stand: Min guard           General transfer comment: Pt makes use of good hand placement to move    Ambulation/Gait Ambulation/Gait assistance: Min guard Gait Distance (Feet): 30 Feet Assistive device: Rolling walker (2 wheels) Gait Pattern/deviations: Step-through pattern, Decreased stride length Gait velocity: reduced Gait velocity interpretation: <1.31 ft/sec, indicative of household ambulator Pre-gait activities: standing balance and ck of BP and sats    Stairs            Wheelchair Mobility    Modified Rankin (Stroke Patients Only)       Balance Overall balance assessment: Mild deficits observed, not formally tested, Needs assistance Sitting-balance support: Feet supported Sitting balance-Leahy Scale: Good     Standing balance support: Bilateral upper extremity supported, During functional activity Standing balance-Leahy Scale: Fair Standing balance comment: walker is beneficial  for dynamic balance                             Pertinent Vitals/Pain Pain Assessment Pain Assessment: Faces Faces Pain Scale: Hurts little more Pain Location: L hip with mobility    Home Living  Family/patient expects to be discharged to:: Private residence Living Arrangements: Alone Available Help at Discharge: Family;Friend(s);Available PRN/intermittently Type of Home: House Home Access: Stairs to enter Entrance Stairs-Rails: Right Entrance Stairs-Number of Steps: 1   Home Layout: One level Home Equipment: Cane - single point;Grab bars - tub/shower Additional Comments: 2 dogs at home    Prior Function Prior Level of Function : Independent/Modified Independent             Mobility Comments: driving at baseline living alone       Hand Dominance   Dominant Hand: Right    Extremity/Trunk Assessment   Upper Extremity Assessment Upper Extremity Assessment: Generalized weakness    Lower Extremity Assessment Lower Extremity Assessment: Defer to PT evaluation    Cervical / Trunk Assessment Cervical / Trunk Assessment: Normal  Communication   Communication: HOH  Cognition Arousal/Alertness: Awake/alert Behavior During Therapy: WFL for tasks assessed/performed Overall Cognitive Status: Within Functional Limits for tasks assessed                                          General Comments General comments (skin integrity, edema, etc.): VSS on RA - negative orthostatics    Exercises     Assessment/Plan    PT Assessment Patient needs continued PT services  PT Problem List Decreased strength;Decreased balance;Decreased mobility;Pain       PT Treatment Interventions DME instruction;Gait training;Stair training;Functional mobility training;Therapeutic activities;Therapeutic exercise;Balance training;Neuromuscular re-education;Patient/family education    PT Goals (Current goals can be found in the Care Plan section)  Acute Rehab PT Goals Patient Stated Goal: reduce L hip pain PT Goal Formulation: With patient Time For Goal Achievement: 09/14/22 Potential to Achieve Goals: Good    Frequency Min 3X/week     Co-evaluation   Reason for  Co-Treatment: Complexity of the patient's impairments (multi-system involvement);For patient/therapist safety;To address functional/ADL transfers   OT goals addressed during session: ADL's and self-care       AM-PAC PT "6 Clicks" Mobility  Outcome Measure Help needed turning from your back to your side while in a flat bed without using bedrails?: None Help needed moving from lying on your back to sitting on the side of a flat bed without using bedrails?: A Little Help needed moving to and from a bed to a chair (including a wheelchair)?: A Little Help needed standing up from a chair using your arms (e.g., wheelchair or bedside chair)?: A Little Help needed to walk in hospital room?: A Little Help needed climbing 3-5 steps with a railing? : A Little 6 Click Score: 19    End of Session Equipment Utilized During Treatment: Oxygen Activity Tolerance: Patient tolerated treatment well Patient left: in chair;with call bell/phone within reach;with chair alarm set Nurse Communication: Mobility status PT Visit Diagnosis: Unsteadiness on feet (R26.81);Muscle weakness (generalized) (M62.81);Difficulty in walking, not elsewhere classified (R26.2)    Time: [id number] PT Time Calculation (min) (ACUTE ONLY): 32 min   Charges:   PT Evaluation $PT Eval Moderate Complexity: 1 Mod         Ramond Dial  09/07/2022, 12:48 PM  Mee Hives, PT PhD Acute Rehab Dept. Number: [place] [facility] [place]

## 2022-09-08 ENCOUNTER — Other Ambulatory Visit: Payer: Self-pay

## 2022-09-08 ENCOUNTER — Emergency Department (HOSPITAL_COMMUNITY): Payer: Medicare Other

## 2022-09-08 ENCOUNTER — Inpatient Hospital Stay (HOSPITAL_COMMUNITY)
Admission: EM | Admit: 2022-09-08 | Discharge: 2022-09-12 | DRG: 177 | Disposition: A | Discharge status: Home Health | Payer: Medicare Other | Attending: Internal Medicine | Admitting: Internal Medicine

## 2022-09-08 ENCOUNTER — Encounter (HOSPITAL_COMMUNITY): Payer: Self-pay

## 2022-09-08 DIAGNOSIS — S0001XA Abrasion of scalp, initial encounter: Secondary | ICD-10-CM | POA: Diagnosis present

## 2022-09-08 DIAGNOSIS — E538 Deficiency of other specified B group vitamins: Secondary | ICD-10-CM | POA: Diagnosis present

## 2022-09-08 DIAGNOSIS — I4892 Unspecified atrial flutter: Secondary | ICD-10-CM | POA: Diagnosis present

## 2022-09-08 DIAGNOSIS — Z79631 Long term (current) use of antimetabolite agent: Secondary | ICD-10-CM

## 2022-09-08 DIAGNOSIS — W07XXXA Fall from chair, initial encounter: Secondary | ICD-10-CM | POA: Diagnosis present

## 2022-09-08 DIAGNOSIS — H3530 Unspecified macular degeneration: Secondary | ICD-10-CM | POA: Diagnosis present

## 2022-09-08 DIAGNOSIS — Z9049 Acquired absence of other specified parts of digestive tract: Secondary | ICD-10-CM

## 2022-09-08 DIAGNOSIS — J440 Chronic obstructive pulmonary disease with acute lower respiratory infection: Secondary | ICD-10-CM | POA: Diagnosis present

## 2022-09-08 DIAGNOSIS — M160 Bilateral primary osteoarthritis of hip: Secondary | ICD-10-CM | POA: Diagnosis present

## 2022-09-08 DIAGNOSIS — E785 Hyperlipidemia, unspecified: Secondary | ICD-10-CM | POA: Diagnosis present

## 2022-09-08 DIAGNOSIS — Z9181 History of falling: Secondary | ICD-10-CM

## 2022-09-08 DIAGNOSIS — Y92010 Kitchen of single-family (private) house as the place of occurrence of the external cause: Secondary | ICD-10-CM | POA: Diagnosis not present

## 2022-09-08 DIAGNOSIS — Z79899 Other long term (current) drug therapy: Secondary | ICD-10-CM

## 2022-09-08 DIAGNOSIS — J189 Pneumonia, unspecified organism: Secondary | ICD-10-CM | POA: Diagnosis not present

## 2022-09-08 DIAGNOSIS — Z801 Family history of malignant neoplasm of trachea, bronchus and lung: Secondary | ICD-10-CM

## 2022-09-08 DIAGNOSIS — H409 Unspecified glaucoma: Secondary | ICD-10-CM | POA: Diagnosis present

## 2022-09-08 DIAGNOSIS — I444 Left anterior fascicular block: Secondary | ICD-10-CM | POA: Diagnosis present

## 2022-09-08 DIAGNOSIS — Z79890 Hormone replacement therapy (postmenopausal): Secondary | ICD-10-CM | POA: Diagnosis not present

## 2022-09-08 DIAGNOSIS — D6959 Other secondary thrombocytopenia: Secondary | ICD-10-CM | POA: Diagnosis present

## 2022-09-08 DIAGNOSIS — H35039 Hypertensive retinopathy, unspecified eye: Secondary | ICD-10-CM | POA: Diagnosis present

## 2022-09-08 DIAGNOSIS — D696 Thrombocytopenia, unspecified: Secondary | ICD-10-CM | POA: Diagnosis present

## 2022-09-08 DIAGNOSIS — E871 Hypo-osmolality and hyponatremia: Secondary | ICD-10-CM | POA: Diagnosis present

## 2022-09-08 DIAGNOSIS — E876 Hypokalemia: Secondary | ICD-10-CM | POA: Diagnosis present

## 2022-09-08 DIAGNOSIS — M810 Age-related osteoporosis without current pathological fracture: Secondary | ICD-10-CM | POA: Diagnosis present

## 2022-09-08 DIAGNOSIS — Z833 Family history of diabetes mellitus: Secondary | ICD-10-CM

## 2022-09-08 DIAGNOSIS — Y95 Nosocomial condition: Secondary | ICD-10-CM | POA: Diagnosis present

## 2022-09-08 DIAGNOSIS — U071 COVID-19: Principal | ICD-10-CM | POA: Diagnosis present

## 2022-09-08 DIAGNOSIS — J1282 Pneumonia due to coronavirus disease 2019: Secondary | ICD-10-CM | POA: Diagnosis present

## 2022-09-08 DIAGNOSIS — M545 Low back pain, unspecified: Secondary | ICD-10-CM | POA: Diagnosis present

## 2022-09-08 DIAGNOSIS — G250 Essential tremor: Secondary | ICD-10-CM | POA: Diagnosis present

## 2022-09-08 DIAGNOSIS — J9601 Acute respiratory failure with hypoxia: Secondary | ICD-10-CM | POA: Diagnosis present

## 2022-09-08 DIAGNOSIS — R54 Age-related physical debility: Secondary | ICD-10-CM | POA: Diagnosis present

## 2022-09-08 DIAGNOSIS — M069 Rheumatoid arthritis, unspecified: Secondary | ICD-10-CM | POA: Diagnosis present

## 2022-09-08 DIAGNOSIS — E039 Hypothyroidism, unspecified: Secondary | ICD-10-CM | POA: Diagnosis present

## 2022-09-08 DIAGNOSIS — R059 Cough, unspecified: Secondary | ICD-10-CM | POA: Diagnosis present

## 2022-09-08 DIAGNOSIS — I10 Essential (primary) hypertension: Secondary | ICD-10-CM | POA: Diagnosis present

## 2022-09-08 DIAGNOSIS — Z86011 Personal history of benign neoplasm of the brain: Secondary | ICD-10-CM

## 2022-09-08 DIAGNOSIS — Z87891 Personal history of nicotine dependence: Secondary | ICD-10-CM

## 2022-09-08 DIAGNOSIS — Z880 Allergy status to penicillin: Secondary | ICD-10-CM

## 2022-09-08 DIAGNOSIS — J159 Unspecified bacterial pneumonia: Secondary | ICD-10-CM | POA: Diagnosis present

## 2022-09-08 DIAGNOSIS — J4489 Other specified chronic obstructive pulmonary disease: Secondary | ICD-10-CM | POA: Diagnosis present

## 2022-09-08 DIAGNOSIS — W19XXXA Unspecified fall, initial encounter: Principal | ICD-10-CM

## 2022-09-08 DIAGNOSIS — Z8249 Family history of ischemic heart disease and other diseases of the circulatory system: Secondary | ICD-10-CM

## 2022-09-08 DIAGNOSIS — E860 Dehydration: Secondary | ICD-10-CM | POA: Diagnosis present

## 2022-09-08 DIAGNOSIS — G629 Polyneuropathy, unspecified: Secondary | ICD-10-CM | POA: Diagnosis present

## 2022-09-08 DIAGNOSIS — H9190 Unspecified hearing loss, unspecified ear: Secondary | ICD-10-CM | POA: Diagnosis present

## 2022-09-08 DIAGNOSIS — S0083XA Contusion of other part of head, initial encounter: Secondary | ICD-10-CM | POA: Diagnosis present

## 2022-09-08 DIAGNOSIS — R296 Repeated falls: Secondary | ICD-10-CM | POA: Diagnosis present

## 2022-09-08 LAB — CBC WITH DIFFERENTIAL/PLATELET
Abs Immature Granulocytes: 0.15 10*3/uL — ABNORMAL HIGH (ref 0.00–0.07)
Basophils Absolute: 0 10*3/uL (ref 0.0–0.1)
Basophils Relative: 0 %
Eosinophils Absolute: 0 10*3/uL (ref 0.0–0.5)
Eosinophils Relative: 0 %
HCT: 34.5 % — ABNORMAL LOW (ref 36.0–46.0)
Hemoglobin: 11.8 g/dL — ABNORMAL LOW (ref 12.0–15.0)
Immature Granulocytes: 2 %
Lymphocytes Relative: 13 %
Lymphs Abs: 0.8 10*3/uL (ref 0.7–4.0)
MCH: 31.6 pg (ref 26.0–34.0)
MCHC: 34.2 g/dL (ref 30.0–36.0)
MCV: 92.2 fL (ref 80.0–100.0)
Monocytes Absolute: 1.7 10*3/uL — ABNORMAL HIGH (ref 0.1–1.0)
Monocytes Relative: 28 %
Neutro Abs: 3.6 10*3/uL (ref 1.7–7.7)
Neutrophils Relative %: 57 %
Platelets: 82 10*3/uL — ABNORMAL LOW (ref 150–400)
RBC: 3.74 MIL/uL — ABNORMAL LOW (ref 3.87–5.11)
RDW: 15.3 % (ref 11.5–15.5)
WBC: 6.3 10*3/uL (ref 4.0–10.5)
nRBC: 0 % (ref 0.0–0.2)

## 2022-09-08 LAB — URINALYSIS, ROUTINE W REFLEX MICROSCOPIC
Bacteria, UA: NONE SEEN
Bilirubin Urine: NEGATIVE
Glucose, UA: NEGATIVE mg/dL
Ketones, ur: 5 mg/dL — AB
Leukocytes,Ua: NEGATIVE
Nitrite: NEGATIVE
Protein, ur: 30 mg/dL — AB
Specific Gravity, Urine: 1.011 (ref 1.005–1.030)
pH: 5 (ref 5.0–8.0)

## 2022-09-08 LAB — BASIC METABOLIC PANEL
Anion gap: 12 (ref 5–15)
BUN: 22 mg/dL (ref 8–23)
CO2: 24 mmol/L (ref 22–32)
Calcium: 8.8 mg/dL — ABNORMAL LOW (ref 8.9–10.3)
Chloride: 97 mmol/L — ABNORMAL LOW (ref 98–111)
Creatinine, Ser: 0.73 mg/dL (ref 0.44–1.00)
GFR, Estimated: >60 mL/min (ref >60)
Glucose, Bld: 112 mg/dL — ABNORMAL HIGH (ref 70–99)
Potassium: 2.7 mmol/L — CL (ref 3.5–5.1)
Sodium: 133 mmol/L — ABNORMAL LOW (ref 135–145)

## 2022-09-08 MED ORDER — POTASSIUM CHLORIDE CRYS ER 20 MEQ PO TBCR
40.0000 meq | EXTENDED_RELEASE_TABLET | Freq: Once | ORAL | Status: AC
  Administered 2022-09-09: 40 meq via ORAL
  Filled 2022-09-08: qty 2

## 2022-09-08 MED ORDER — ACETAMINOPHEN 325 MG PO TABS
650.0000 mg | ORAL_TABLET | Freq: Once | ORAL | Status: AC
  Administered 2022-09-08: 650 mg via ORAL
  Filled 2022-09-08: qty 2

## 2022-09-08 MED ORDER — POTASSIUM CHLORIDE 10 MEQ/100ML IV SOLN
10.0000 meq | INTRAVENOUS | Status: AC
  Administered 2022-09-09 (×2): 10 meq via INTRAVENOUS
  Filled 2022-09-08 (×2): qty 100

## 2022-09-08 MED ORDER — SODIUM CHLORIDE 0.9 % IV SOLN
500.0000 mg | INTRAVENOUS | Status: DC
  Administered 2022-09-09: 500 mg via INTRAVENOUS
  Filled 2022-09-08: qty 5

## 2022-09-08 MED ORDER — SODIUM CHLORIDE 0.9 % IV SOLN
1.0000 g | Freq: Once | INTRAVENOUS | Status: AC
  Administered 2022-09-09: 1 g via INTRAVENOUS
  Filled 2022-09-08: qty 10

## 2022-09-08 NOTE — ED Triage Notes (Signed)
BIB EMS from home for frequent falls. Pt has fallen multiple times today and every day for the last week. C/o left hip pain that is chronic, but is worse after fall. Pt hit head, no LOC.  No blood thinners.

## 2022-09-08 NOTE — ED Notes (Signed)
Pt to ct via stretcher

## 2022-09-08 NOTE — H&P (Signed)
History and Physical    Patient: Kayla Hurley [id number] DOB: 1934/08/12 DOA: 09/08/2022 DOS: the patient was seen and examined on 09/08/2022 PCP: Pcp, No  Patient coming from: Home  Chief Complaint:  Chief Complaint  Patient presents with   Fall   Hip Pain    Left   HPI: Kayla Hurley is a 88 y.o. female with medical history significant of rheumatoid arthritis, COPD, recent diagnosis of COVID-19 with COVID-19 pneumonia, hypertensive retinopathy and macular degeneration among other things who was only discharged yesterday after her admission for COVID-pneumonia and hypoxia.  Patient was doing better and went home.  She was feeling weak but today had multiple episodes of falling at home.  She came in with dehydrated with potassium of 2.7.  Patient also has increased respiratory drive with some mild hypoxia.  Chest x-ray showed new infiltrates.  During last hospitalization she was not treated with antibiotics as it was presumed to be viral pneumonia.  Patient however appears to be having secondary bacterial pneumonia, generalized weakness as a result of the COVID-19 infection.  She is being admitted to the hospital for treatment of secondary bacterial pneumonia.  Review of Systems: As mentioned in the history of present illness. All other systems reviewed and are negative. Past Medical History:  Diagnosis Date   COPD (chronic obstructive pulmonary disease)    Glaucoma    Hypertensive retinopathy    Macular degeneration    Meningioma    Rheumatoid arthritis    Past Surgical History:  Procedure Laterality Date   APPENDECTOMY     CATARACT EXTRACTION     CESAREAN SECTION     x 3   CHOLECYSTECTOMY     CRANIECTOMY / CRANIOTOMY FOR EXCISION OF BRAIN TUMOR     meningioma   EYE SURGERY     HYSTEROSCOPY WITH D & C     INGUINAL HERNIA REPAIR     YAG LASER APPLICATION     Social History:  reports that she has quit smoking. Her smoking use included cigarettes. She has a 50.00 pack-year  smoking history. She has never used smokeless tobacco. She reports that she does not currently use alcohol. She reports that she does not use drugs.  Allergies  Allergen Reactions   Adhesive [Tape] Other (See Comments)    blisters   Penicillins Diarrhea, Nausea And Vomiting and Rash    Family History  Problem Relation Age of Onset   Other Mother        died during open heart surgery   Heart disease Father    Lung cancer Maternal Grandfather        smoker   Diabetes Son     Prior to Admission medications   Medication Sig Start Date End Date Taking? Authorizing Provider  acetaminophen (TYLENOL) 325 MG tablet Take 2 tablets (650 mg total) by mouth every 6 (six) hours as needed for mild pain (or Fever >/= 101). 09/07/22   Jacelyn Grip, MD  Calcium-Phosphorus-Vitamin D ([place] +D3) [phone] MG-MG-UNIT CHEW See admin instructions.    [provider]  clobetasol (TEMOVATE) 0.05 % external solution Apply topically 2 (two) times daily as needed. 08/28/22   [provider]  diclofenac Sodium (VOLTAREN) 1 % GEL Apply topically. 11/13/17   [provider]  fluticasone (CUTIVATE) 0.05 % cream Apply topically 2 (two) times daily as needed. 08/29/22   [provider]  folic acid (FOLVITE) 1 MG tablet Take 1 mg by mouth daily. Take two tablets daily  [provider]  gabapentin (NEURONTIN) 100 MG capsule Take 1 capsule (100 mg total) by mouth at bedtime. 08/04/22   Tat, Eustace Quail, DO  hydrochlorothiazide (HYDRODIURIL) 25 MG tablet Take 25 mg by mouth daily. 12/30/20   [provider]  ketoconazole (NIZORAL) 2 % cream SMARTSIG:Sparingly Topical Daily 08/11/22   [provider]  leucovorin (WELLCOVORIN) 5 MG tablet TAKE 2 TABLETS BY MOUTH ONCE A WEEK 08/12/19   [provider]  levothyroxine (SYNTHROID) 50 MCG tablet Take 50 mcg by mouth daily before breakfast.    [provider]  methotrexate (RHEUMATREX) 2.5 MG tablet Take  10 mg by mouth once a week. Caution:Chemotherapy. Protect from light.    [provider]  Multiple Vitamins-Minerals (PRESERVISION AREDS 2+MULTI VIT) CAPS See admin instructions.    [provider]  potassium chloride (KLOR-CON) 20 MEQ packet Take 20 mEq by mouth 2 (two) times daily.    [provider]  primidone (MYSOLINE) 50 MG tablet TAKE 1 TABLET BY MOUTH AT BEDTIME 07/25/22   Tat, Eustace Quail, DO  Propylene Glycol (SYSTANE BALANCE OP) Apply to eye.    [provider]  salsalate (DISALCID) 750 MG tablet Take 2 tablets by mouth 2 (two) times daily. 09/02/19   [provider]  timolol (BETIMOL) 0.25 % ophthalmic solution Place 1 drop into both eyes 2 (two) times daily.    [provider]    Physical Exam: Vitals:   09/08/22 2143 09/08/22 2151 09/08/22 2200 09/08/22 2245  BP: (!) 148/67  (!) 142/60 (!) 116/59  Pulse: 89  90 81  Resp: 18  16 18   Temp: (!) 101.9 F (38.8 C)     TempSrc: Oral     SpO2: 94%  92% 92%  Weight:  61.2 kg    Height:  5\' 2"  (1.575 m)     Constitutional: Acutely ill looking, frail, NAD, calm, comfortable Eyes: PERRL, lids and conjunctivae normal ENMT: Mucous membranes are dry. Posterior pharynx clear of any exudate or lesions.Normal dentition.  Neck: normal, supple, no masses, no thyromegaly Respiratory: clear to auscultation bilaterally, no wheezing, no crackles. Normal respiratory effort. No accessory muscle use.  Cardiovascular: Regular rate and rhythm, no murmurs / rubs / gallops. No extremity edema. 2+ pedal pulses. No carotid bruits.  Abdomen: no tenderness, no masses palpated. No hepatosplenomegaly. Bowel sounds positive.  Musculoskeletal: Good range of motion, no joint swelling or tenderness, Skin: no rashes, lesions, ulcers. No induration Neurologic: CN 2-12 grossly intact. Sensation intact, DTR normal. Strength 5/5 in all 4.  Psychiatric: Normal judgment and insight. Alert and oriented x 3. Normal  mood  Data Reviewed:  Temperature is 1001.9, white count 6.3, hemoglobin 11.8 and platelets 82.  Sodium 133 potassium 2.7 chloride 97.  Creatinine 0.73 and INR 1.1.  Glucose 112.  Urinalysis essentially negative chest x-ray showed  worsening heterogeneous airspace disease in the left mid and lower lung zones suspicious for pneumonia  Assessment and Plan:  #1 secondary bacterial pneumonia: Post-COVID 19 pneumonia.  Patient will be admitted.  Initiate full antibiotics.  Cultures obtained and will monitor.  Hydration and supportive care.  #2 COVID-19 infection: This appears to be day 8 of treatment.  Not on steroids.  Restart Decadron especially with hypoxia.  Continue supportive care  #3 sepsis due to pneumonia: Hydrate and follow sepsis protocol.  #4 hyponatremia: Continue to hydrate use saline  #5 severe hypokalemia: Replete potassium.  Check magnesium level  #6 hypothyroidism: Continue levothyroxine.  #7 rheumatoid arthritis:  Continue home medications  #8 fall at home: Gait instability.  Will get PT OT consult.  Possible rehab   Advance Care Planning:   Code Status: Full Code   Consults: None  Family Communication: No family at bedside  Severity of Illness: The appropriate patient status for this patient is INPATIENT. Inpatient status is judged to be reasonable and necessary in order to provide the required intensity of service to ensure the patient's safety. The patient's presenting symptoms, physical exam findings, and initial radiographic and laboratory data in the context of their chronic comorbidities is felt to place them at high risk for further clinical deterioration. Furthermore, it is not anticipated that the patient will be medically stable for discharge from the hospital within 2 midnights of admission.   * I certify that at the point of admission it is my clinical judgment that the patient will require inpatient hospital care spanning beyond 2 midnights from the point  of admission due to high intensity of service, high risk for further deterioration and high frequency of surveillance required.*  AuthorBarbette Merino, MD 09/08/2022 11:31 PM  For on call review [email or web address].

## 2022-09-08 NOTE — ED Notes (Signed)
Pt son called and states he would like updates on his mothers care. Son states he is the pt POA. This RN will update son as results come and will pass on to next shift to update son

## 2022-09-08 NOTE — ED Provider Notes (Signed)
[facility] Provider Note   CSN: [id number] Arrival date & time: 09/08/22  2135     History  Chief Complaint  Patient presents with   Fall   Hip Pain    Left    Kayla Hurley is a 88 y.o. female Presented to ED complaining of hip pain and a fall in the setting of COVID diagnosis.  The patient has a history of COPD not on home oxygen, had been hospitalized and discharged yesterday for acute COVID viral pneumonia, discharged on room air, and also noted that the hip pain after falls, with unremarkable x-ray imaging.  She was able to ambulate with PT in the hospital.  She was noted to have chronic thrombocytopenia.  Patient reports she was discharged home and has been in decline overnight, says that she cannot stand up on her own, has no additional help in the house, and she overestimated her strength.  She reports he had another fall today and fell flat on her back, striking the back of her head on the ground.  She is still having pain in her hips.  Per discharge summary, the patient initially received antibiotics on admission to the hospitalist azithromycin and ceftriaxone but this was continued given that she had a Hurley viral etiology.  She received 2 doses of dexamethasone in the hospital.  She was not placed on remdesivir.  Her x-rays at that time showed airspace disease in the left middle and lower lobe suspicious for pneumonia.  HPI     Home Medications Prior to Admission medications   Medication Sig Start Date End Date Taking? Authorizing Provider  acetaminophen (TYLENOL) 325 MG tablet Take 2 tablets (650 mg total) by mouth every 6 (six) hours as needed for mild pain (or Fever >/= 101). 09/07/22   Jacelyn Grip, MD  Calcium-Phosphorus-Vitamin D ([place] +D3) [phone] MG-MG-UNIT CHEW See admin instructions.    [provider]  clobetasol (TEMOVATE) 0.05 % external solution Apply topically 2 (two) times daily as needed. 08/28/22    [provider]  diclofenac Sodium (VOLTAREN) 1 % GEL Apply topically. 11/13/17   [provider]  fluticasone (CUTIVATE) 0.05 % cream Apply topically 2 (two) times daily as needed. 08/29/22   [provider]  folic acid (FOLVITE) 1 MG tablet Take 1 mg by mouth daily. Take two tablets daily    [provider]  gabapentin (NEURONTIN) 100 MG capsule Take 1 capsule (100 mg total) by mouth at bedtime. 08/04/22   Tat, Eustace Quail, DO  hydrochlorothiazide (HYDRODIURIL) 25 MG tablet Take 25 mg by mouth daily. 12/30/20   [provider]  ketoconazole (NIZORAL) 2 % cream SMARTSIG:Sparingly Topical Daily 08/11/22   [provider]  leucovorin (WELLCOVORIN) 5 MG tablet TAKE 2 TABLETS BY MOUTH ONCE A WEEK 08/12/19   [provider]  levothyroxine (SYNTHROID) 50 MCG tablet Take 50 mcg by mouth daily before breakfast.    [provider]  methotrexate (RHEUMATREX) 2.5 MG tablet Take 10 mg by mouth once a week. Caution:Chemotherapy. Protect from light.    [provider]  Multiple Vitamins-Minerals (PRESERVISION AREDS 2+MULTI VIT) CAPS See admin instructions.    [provider]  potassium chloride (KLOR-CON) 20 MEQ packet Take 20 mEq by mouth 2 (two) times daily.    [provider]  primidone (MYSOLINE) 50 MG tablet TAKE 1 TABLET BY MOUTH AT BEDTIME 07/25/22   Tat, Eustace Quail, DO  Propylene Glycol (SYSTANE BALANCE OP) Apply to  eye.    [provider]  salsalate (DISALCID) 750 MG tablet Take 2 tablets by mouth 2 (two) times daily. 09/02/19   [provider]  timolol (BETIMOL) 0.25 % ophthalmic solution Place 1 drop into both eyes 2 (two) times daily.    [provider]      Allergies    Adhesive [tape] and Penicillins    Review of Systems   Review of Systems  Physical Exam Updated Vital Signs BP (!) 116/59   Pulse 81   Temp (!) 101.9 F (38.8 C) (Oral)   Resp 18   Ht 5\' 2"  (1.575 m)   Wt  61.2 kg   SpO2 92%   BMI 24.69 kg/m  Physical Exam Constitutional:      General: She is not in acute distress. HENT:     Head: Normocephalic and atraumatic.  Eyes:     Conjunctiva/sclera: Conjunctivae normal.     Pupils: Pupils are equal, round, and reactive to light.  Cardiovascular:     Rate and Rhythm: Normal rate and regular rhythm.  Pulmonary:     Effort: Pulmonary effort is normal. No respiratory distress.  Abdominal:     General: There is no distension.     Tenderness: There is no abdominal tenderness.  Skin:    General: Skin is warm and dry.  Neurological:     General: No focal deficit present.     Mental Status: She is alert. Mental status is at baseline.  Psychiatric:        Mood and Affect: Mood normal.        Behavior: Behavior normal.     ED Results / Procedures / Treatments   Labs (all labs ordered are listed, but only abnormal results are displayed) Labs Reviewed  BASIC METABOLIC PANEL - Abnormal; Notable for the following components:      Result Value   Sodium 133 (*)    Potassium 2.7 (*)    Chloride 97 (*)    Glucose, Bld 112 (*)    Calcium 8.8 (*)    All other components within normal limits  CBC WITH DIFFERENTIAL/PLATELET - Abnormal; Notable for the following components:   RBC 3.74 (*)    Hemoglobin 11.8 (*)    HCT 34.5 (*)    Platelets 82 (*)    Monocytes Absolute 1.7 (*)    Abs Immature Granulocytes 0.15 (*)    All other components within normal limits  URINALYSIS, ROUTINE W REFLEX MICROSCOPIC    EKG EKG Interpretation  Date/Time:  Thursday September 08 2022 21:50:13 EDT Ventricular Rate:  88 PR Interval:  157 QRS Duration: 108 QT Interval:  374 QTC Calculation: 453 R Axis:   -58 Text Interpretation: Sinus rhythm Left anterior fascicular block Abnormal R-wave progression, late transition Confirmed by Octaviano Glow [phone]) on 09/08/2022 9:55:12 PM  Radiology CT Head Wo Contrast  Result Date: 09/08/2022 CLINICAL DATA:  Frequent falls  including multiple falls today in every day for the last week. Head trauma, moderate-severe EXAM: CT HEAD WITHOUT CONTRAST TECHNIQUE: Contiguous axial images were obtained from the base of the skull through the vertex without intravenous contrast. RADIATION DOSE REDUCTION: This exam was performed according to the departmental dose-optimization program which includes automated exposure control, adjustment of the mA and/or kV according to patient size and/or use of iterative reconstruction technique. COMPARISON:  CT head 09/06/2022 FINDINGS: Brain: No intracranial hemorrhage, mass effect, or evidence of acute infarct. No hydrocephalus. No extra-axial fluid collection. Generalized cerebral atrophy. Ill-defined  hypoattenuation within the cerebral white matter is nonspecific but consistent with chronic small vessel ischemic disease. Moderate-sized area of encephalomalacia in the right vertex and evidence of prior craniotomy. History of meningioma resection. Vascular: No hyperdense vessel. Intracranial arterial calcification. Skull: No fracture or focal lesion. Remote craniotomy at the vertex. Sinuses/Orbits: No acute finding. Mucosal thickening in the ethmoid air cells and left-greater-than-right maxillary sinuses. Air-fluid level in the left maxillary sinus. Other: None. IMPRESSION: 1. No acute intracranial abnormality. 2. Paranasal sinus disease with air-fluid level in the left maxillary sinus. Correlate for acute sinusitis. Electronically Signed   By: Placido Sou M.D.   On: 09/08/2022 23:05   DG Chest 2 View  Result Date: 09/08/2022 CLINICAL DATA:  Frequent falls. Evaluate for pneumonia. EXAM: CHEST - 2 VIEW COMPARISON:  Chest radiograph 09/06/2022 FINDINGS: Slight worsening heterogeneous airspace disease in the left mid lower lung zone, although partially obscured on the frontal view due to overlying monitoring devices. The heart is normal in size with stable mediastinal contours. No pneumothorax or  significant pleural effusion. No pulmonary edema. No acute osseous abnormalities are seen. No visualized rib fractures. IMPRESSION: Slight worsening heterogeneous airspace disease in the left mid and lower lung zone, suspicious for pneumonia. Electronically Signed   By: Keith Rake M.D.   On: 09/08/2022 22:36   DG Hips Bilat W or Wo Pelvis 3-4 Views  Result Date: 09/08/2022 CLINICAL DATA:  Frequent falls. Multiple falls today. Acute on chronic left hip pain. EXAM: DG HIP (WITH OR WITHOUT PELVIS) 3-4V BILAT COMPARISON:  Pelvis and left hip radiographs yesterday. FINDINGS: No acute fracture of the pelvis or hips. Moderate left and mild to moderate right hip osteoarthritis with joint space narrowing and spurring. The pubic rami are intact. No pubic symphysis or sacroiliac joint diastasis. No focal bone abnormalities. IMPRESSION: 1. No fracture of the pelvis or hips. 2. Hip osteoarthritis is unchanged from yesterday's exam. Electronically Signed   By: Keith Rake M.D.   On: 09/08/2022 22:33   DG HIP UNILAT WITH PELVIS 2-3 VIEWS LEFT  Result Date: 09/07/2022 CLINICAL DATA:  Hip pain. EXAM: DG HIP (WITH OR WITHOUT PELVIS) 2-3V LEFT COMPARISON:  AP pelvis 09/06/2022 FINDINGS: Frontal view of the pelvis and frontal and lateral views of the left hip. The bilateral sacroiliac joint spaces are maintained. Mild pubic symphysis joint space narrowing. Moderate bilateral superomedial femoroacetabular joint space narrowing. Mild left femoral head-neck junction circumferential degenerative spurs. Moderate left and mild right superolateral acetabular degenerative osteophytes. Small well corticated chronic ossicle lateral to the superior left acetabulum. No acute fracture or dislocation. Mild-to-moderate atherosclerotic calcifications. 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 Electronically Signed   By: Yvonne Kendall M.D.   On: 09/07/2022 12:34    Procedures .Critical  Care  Performed by: Wyvonnia Dusky, MD Authorized by: Wyvonnia Dusky, MD   Critical care provider statement:    Critical care time (minutes):  30   Critical care time was exclusive of:  Separately billable procedures and treating other patients   Critical care was necessary to treat or prevent imminent or life-threatening deterioration of the following conditions:  Metabolic crisis   Critical care was time spent personally by me on the following activities:  Ordering and performing treatments and interventions, ordering and review of laboratory studies, ordering and review of radiographic studies, pulse oximetry, review of old charts, examination of patient and evaluation of patient's response to treatment Comments:     IV potassium for hypokalemia  Medications Ordered in ED Medications  cefTRIAXone (ROCEPHIN) 1 g in sodium chloride 0.9 % 100 mL IVPB (has no administration in time range)  azithromycin (ZITHROMAX) 500 mg in sodium chloride 0.9 % 250 mL IVPB (has no administration in time range)  potassium chloride SA (KLOR-CON M) CR tablet 40 mEq (has no administration in time range)  potassium chloride 10 mEq in 100 mL IVPB (has no administration in time range)  acetaminophen (TYLENOL) tablet 650 mg (650 mg Oral Given 09/08/22 2213)    ED Course/ Medical Decision Making/ A&P Clinical Course as of 09/08/22 2330  Thu Sep 08, 2022  2327 That is admitted to the hospitalist.  In discussion with the hospitalist I agree that it would be reasonable to resume antibiotics at this time given her persistent fevers now on day 8 of a suspected viral syndrome, also worsening consolidation on chest x-ray.  Rocephin and azithromycin ordered, which were the medication she was given 2 days ago in the hospital.  She tolerated them well.  Her potassium is also low at 2.7 and have ordered oral and IV potassium.  She is stable at this time on room air for medical admission. [MT]    Clinical Course User  Index [MT] Antero Derosia, Carola Rhine, MD                             Medical Decision Making Amount and/or Complexity of Data Reviewed Labs: ordered. Radiology: ordered.  Risk OTC drugs. Prescription drug management. Decision regarding hospitalization.   This patient presents to the ED with concern for fall, generalized weakness, fever. This involves an extensive number of treatment options, and is a complaint that carries with it a high risk of complications and morbidity.  The differential diagnosis includes continued generalized weakness from COVID viral illness is most Hurley diagnosis, but cannot exclude possibility of superimposed or pneumonia  Co-morbidities that complicate the patient evaluation: History of COPD at high risk of pulmonary complications  Additional history obtained from EMS  External records from outside source obtained and reviewed including hospital discharge summary from yesterday  I ordered and personally interpreted labs.  The pertinent results include: Hypokalemia potassium 2.7.  Creatinine within normal limits.  White blood cell count 6.3.  I ordered imaging studies including x-ray of the chest, xray hip, CT head I independently visualized and interpreted imaging which showed no emergent findings of the pelvis, suspected worsening consolidation on chest x-ray; no acute intracranial findings I agree with the radiologist interpretation  The patient was maintained on a cardiac monitor.  I personally viewed and interpreted the cardiac monitored which showed an underlying rhythm of: Sinus rhythm  Per my interpretation the patient's ECG shows sinus rhythm no acute ischemic findings  I ordered medication including Tylenol for fever  I have reviewed the patients home medicines and have made adjustments as needed  Test Considered: Low suspicion for acute pulmonary embolism.  No indication for emergent CT angiogram at this time   After the interventions noted  above, I reevaluated the patient and found that they have: stayed the same    Dispostion:  After consideration of the diagnostic results and the patients response to treatment, I feel that the patent would benefit from medical admission.         Final Clinical Impression(s) / ED Diagnoses Final diagnoses:  Fall, initial encounter  Hypokalemia  Pneumonia due to infectious organism, unspecified laterality, unspecified part of lung  Rx / DC Orders ED Discharge Orders     None         Wyvonnia Dusky, MD 09/08/22 2330

## 2022-09-08 NOTE — ED Notes (Signed)
Pt to xray via stretcher

## 2022-09-09 DIAGNOSIS — U071 COVID-19: Secondary | ICD-10-CM

## 2022-09-09 DIAGNOSIS — J189 Pneumonia, unspecified organism: Secondary | ICD-10-CM | POA: Diagnosis not present

## 2022-09-09 LAB — COMPREHENSIVE METABOLIC PANEL
ALT: 21 U/L (ref 0–44)
AST: 43 U/L — ABNORMAL HIGH (ref 15–41)
Albumin: 2.8 g/dL — ABNORMAL LOW (ref 3.5–5.0)
Alkaline Phosphatase: 29 U/L — ABNORMAL LOW (ref 38–126)
Anion gap: 9 (ref 5–15)
BUN: 17 mg/dL (ref 8–23)
CO2: 27 mmol/L (ref 22–32)
Calcium: 8.6 mg/dL — ABNORMAL LOW (ref 8.9–10.3)
Chloride: 101 mmol/L (ref 98–111)
Creatinine, Ser: 0.67 mg/dL (ref 0.44–1.00)
GFR, Estimated: >60 mL/min (ref >60)
Glucose, Bld: 103 mg/dL — ABNORMAL HIGH (ref 70–99)
Potassium: 3.5 mmol/L (ref 3.5–5.1)
Sodium: 137 mmol/L (ref 135–145)
Total Bilirubin: 0.6 mg/dL (ref 0.3–1.2)
Total Protein: 5.5 g/dL — ABNORMAL LOW (ref 6.5–8.1)

## 2022-09-09 LAB — CBC
HCT: 33.4 % — ABNORMAL LOW (ref 36.0–46.0)
Hemoglobin: 11.5 g/dL — ABNORMAL LOW (ref 12.0–15.0)
MCH: 32.5 pg (ref 26.0–34.0)
MCHC: 34.4 g/dL (ref 30.0–36.0)
MCV: 94.4 fL (ref 80.0–100.0)
Platelets: 75 10*3/uL — ABNORMAL LOW (ref 150–400)
RBC: 3.54 MIL/uL — ABNORMAL LOW (ref 3.87–5.11)
RDW: 15.2 % (ref 11.5–15.5)
WBC: 5.5 10*3/uL (ref 4.0–10.5)
nRBC: 0 % (ref 0.0–0.2)

## 2022-09-09 LAB — HIV ANTIBODY (ROUTINE TESTING W REFLEX): HIV Screen 4th Generation wRfx: NONREACTIVE

## 2022-09-09 MED ORDER — METHOTREXATE SODIUM 2.5 MG PO TABS: 10.0000 mg | ORAL_TABLET | ORAL | Status: DC

## 2022-09-09 MED ORDER — ENOXAPARIN SODIUM 40 MG/0.4ML IJ SOSY
40.0000 mg | PREFILLED_SYRINGE | INTRAMUSCULAR | Status: DC
  Administered 2022-09-09 – 2022-09-12 (×4): 40 mg via SUBCUTANEOUS
  Filled 2022-09-09 (×4): qty 0.4

## 2022-09-09 MED ORDER — MOMETASONE FURO-FORMOTEROL FUM 200-5 MCG/ACT IN AERO
2.0000 | INHALATION_SPRAY | Freq: Two times a day (BID) | RESPIRATORY_TRACT | Status: DC
  Filled 2022-09-09: qty 8.8

## 2022-09-09 MED ORDER — HYDROCHLOROTHIAZIDE 25 MG PO TABS
25.0000 mg | ORAL_TABLET | Freq: Every day | ORAL | Status: DC
  Filled 2022-09-09: qty 1

## 2022-09-09 MED ORDER — LEVOTHYROXINE SODIUM 50 MCG PO TABS
50.0000 ug | ORAL_TABLET | Freq: Every day | ORAL | Status: DC
  Administered 2022-09-10 – 2022-09-12 (×3): 50 ug via ORAL
  Filled 2022-09-09 (×4): qty 1

## 2022-09-09 MED ORDER — SODIUM CHLORIDE 0.9 % IV SOLN
2.0000 g | INTRAVENOUS | Status: DC
  Administered 2022-09-09 – 2022-09-11 (×3): 2 g via INTRAVENOUS
  Filled 2022-09-09 (×3): qty 20

## 2022-09-09 MED ORDER — DICLOFENAC SODIUM 1 % EX GEL
2.0000 g | Freq: Four times a day (QID) | CUTANEOUS | Status: DC
  Administered 2022-09-09 – 2022-09-12 (×11): 2 g via TOPICAL
  Filled 2022-09-09: qty 100

## 2022-09-09 MED ORDER — MOMETASONE FURO-FORMOTEROL FUM 200-5 MCG/ACT IN AERO
2.0000 | INHALATION_SPRAY | Freq: Two times a day (BID) | RESPIRATORY_TRACT | Status: DC
  Administered 2022-09-09 – 2022-09-12 (×6): 2 via RESPIRATORY_TRACT
  Filled 2022-09-09: qty 8.8

## 2022-09-09 MED ORDER — LEUCOVORIN CALCIUM 5 MG PO TABS
15.0000 mg | ORAL_TABLET | Freq: Every day | ORAL | Status: DC
  Administered 2022-09-09 – 2022-09-11 (×3): 15 mg via ORAL
  Filled 2022-09-09 (×3): qty 3

## 2022-09-09 MED ORDER — GABAPENTIN 100 MG PO CAPS
100.0000 mg | ORAL_CAPSULE | Freq: Every day | ORAL | Status: DC
  Administered 2022-09-09 – 2022-09-11 (×3): 100 mg via ORAL
  Filled 2022-09-09 (×3): qty 1

## 2022-09-09 MED ORDER — FLUTICASONE PROPIONATE 0.05 % EX CREA: TOPICAL_CREAM | Freq: Two times a day (BID) | CUTANEOUS | Status: DC

## 2022-09-09 MED ORDER — SODIUM CHLORIDE 0.9 % IV SOLN
500.0000 mg | INTRAVENOUS | Status: DC
  Administered 2022-09-09 – 2022-09-11 (×3): 500 mg via INTRAVENOUS
  Filled 2022-09-09 (×3): qty 5

## 2022-09-09 MED ORDER — ACETAMINOPHEN 325 MG PO TABS
650.0000 mg | ORAL_TABLET | Freq: Four times a day (QID) | ORAL | Status: DC | PRN
  Administered 2022-09-09 (×2): 650 mg via ORAL
  Filled 2022-09-09 (×3): qty 2

## 2022-09-09 MED ORDER — DEXAMETHASONE 4 MG PO TABS: 6.0000 mg | ORAL_TABLET | Freq: Every day | ORAL | Status: DC

## 2022-09-09 MED ORDER — ONDANSETRON HCL 4 MG/2ML IJ SOLN: 4.0000 mg | Freq: Four times a day (QID) | INTRAMUSCULAR | Status: DC | PRN

## 2022-09-09 MED ORDER — FOLIC ACID 1 MG PO TABS
1.0000 mg | ORAL_TABLET | Freq: Every day | ORAL | Status: DC
  Administered 2022-09-09 – 2022-09-11 (×3): 1 mg via ORAL
  Filled 2022-09-09 (×3): qty 1

## 2022-09-09 MED ORDER — SALSALATE 750 MG PO TABS
1500.0000 mg | ORAL_TABLET | Freq: Two times a day (BID) | ORAL | Status: DC
  Administered 2022-09-10 – 2022-09-12 (×4): 1500 mg via ORAL

## 2022-09-09 MED ORDER — ALUM & MAG HYDROXIDE-SIMETH 200-200-20 MG/5ML PO SUSP
30.0000 mL | Freq: Four times a day (QID) | ORAL | Status: DC | PRN
  Administered 2022-09-10 – 2022-09-11 (×2): 30 mL via ORAL
  Filled 2022-09-09 (×2): qty 30

## 2022-09-09 MED ORDER — PRIMIDONE 50 MG PO TABS
50.0000 mg | ORAL_TABLET | Freq: Every day | ORAL | Status: DC
  Administered 2022-09-09 – 2022-09-11 (×3): 50 mg via ORAL
  Filled 2022-09-09 (×4): qty 1

## 2022-09-09 MED ORDER — METHYLPREDNISOLONE SODIUM SUCC 125 MG IJ SOLR
80.0000 mg | Freq: Every day | INTRAMUSCULAR | Status: DC
  Administered 2022-09-09 – 2022-09-12 (×4): 80 mg via INTRAVENOUS
  Filled 2022-09-09 (×4): qty 2

## 2022-09-09 MED ORDER — POTASSIUM CHLORIDE 20 MEQ PO PACK
20.0000 meq | PACK | Freq: Two times a day (BID) | ORAL | Status: DC
  Administered 2022-09-09 – 2022-09-12 (×8): 20 meq via ORAL
  Filled 2022-09-09 (×8): qty 1

## 2022-09-09 MED ORDER — GUAIFENESIN 100 MG/5ML PO LIQD: 5.0000 mL | ORAL | Status: DC | PRN

## 2022-09-09 MED ORDER — SODIUM CHLORIDE 0.9 % IV SOLN: INTRAVENOUS | Status: DC

## 2022-09-09 MED ORDER — CLOBETASOL PROPIONATE 0.05 % EX SOLN: Freq: Two times a day (BID) | CUTANEOUS | Status: DC

## 2022-09-09 MED ORDER — TIMOLOL MALEATE 0.25 % OP SOLN
1.0000 [drp] | Freq: Two times a day (BID) | OPHTHALMIC | Status: DC
  Administered 2022-09-09 – 2022-09-11 (×6): 1 [drp] via OPHTHALMIC
  Filled 2022-09-09: qty 5

## 2022-09-09 NOTE — Progress Notes (Signed)
PROGRESS NOTE    Kayla Hurley  VWP:[id number] DOB: [birth year] DOA: 09/08/2022 PCP: Pcp, No   Brief Narrative:  88 y.o. female with medical history significant of rheumatoid arthritis, COPD, hypertensive retinopathy and macular degeneration, recent admission from 09/06/2022-09/07/2022 for COVID-19 pneumonia treated with 2 doses of Decadron presented with multiple episodes of fall along with worsening weakness and heterogeneous airspace disease in the left mid and lower lung zone, suspicious for pneumonia. On presentation, potassium was 2.7; chest x-ray showed slight worsening intrusiveness of airspace disease in the left mid and lower lung zone, suspicious for pneumonia.  CT of the head was negative for acute intracranial abnormity.  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  She was started on IV antibiotics and steroids.  Assessment & Plan:   COVID-19 pneumonia Possible secondary community-acquired bacterial pneumonia -Tested positive for COVID-19 on 09/06/2022 and was admitted from 09/06/2022-09/07/2022 and treated with 2 doses of Decadron.  Presented with worsening weakness and shortness of breath.  Imaging as above. -Currently on IV steroids and antibiotics.  On room air. -Check inflammatory markers in AM.  Check procalcitonin in AM. -Continue isolation  Sepsis has been ruled out  Hyponatremia -Possibly from dehydration.  Improved with IV fluids.  Repeat a.m. labs.  DC IV fluids.  Encourage oral intake  Hypokalemia -Improved  Thrombocytopenia -Questionable cause.  No signs of bleeding.  Monitor  Hypothyroidism -Continue Synthroid  Rheumatoid arthritis -Hold home regimen till patient has an active infection  Falls at home Gait instability -PT/OT eval.  Hypertension -Blood pressure on the lower side.  Hold hydrochlorothiazide.  Goals of care -Patient is currently listed as full code.  Palliative care consultation for goals of care  discussion  DVT prophylaxis: Lovenox Code Status: Full Family Communication: None at bedside Disposition Plan: Status is: Inpatient Remains inpatient appropriate because: Of severity of illness    Consultants: Consult palliative care  Procedures: None  Antimicrobials: Rocephin and Zithromax from 09/08/2022 onwards   Subjective: Patient seen and examined at bedside.  Hard of hearing.  Denies worsening shortness of breath or cough.  Complains of weakness.  No chest pain, vomiting reported. Objective: Vitals:   09/08/22 2354 09/09/22 0012 09/09/22 0105 09/09/22 0618  BP: 104/60  (!) 110/51 (!) 126/58  Pulse: 76  70 77  Resp: 19  17 17   Temp:  100 F (37.8 C) 98.9 F (37.2 C) 98.7 F (37.1 C)  TempSrc:  Oral Oral Oral  SpO2: 93%  94% 94%  Weight:      Height:        Intake/Output Summary (Last 24 hours) at 09/09/2022 1006 Last data filed at 09/09/2022 0619 Gross per 24 hour  Intake 1041.82 ml  Output 1200 ml  Net -158.18 ml   Filed Weights   09/08/22 2151  Weight: 61.2 kg    Examination:  General exam: Appears calm and comfortable.  Chronically ill and deconditioned looking.  Elderly female lying in bed.  Hard of hearing. Respiratory system: Bilateral decreased breath sounds at bases with some scattered crackles Cardiovascular system: S1 & S2 heard, Rate controlled Gastrointestinal system: Abdomen is nondistended, soft and nontender. Normal bowel sounds heard. Extremities: No cyanosis, clubbing, edema  Central nervous system: Alert and oriented.  Slow to respond.  Poor historian.  No focal neurological deficits. Moving extremities Skin: No rashes, lesions or ulcers Psychiatry: Flat affect.  Not agitated.    Data Reviewed: I have personally reviewed following labs and imaging studies  CBC: Recent Labs  Lab 09/06/22 1531 09/06/22 1915 09/07/22 1234 09/08/22 2249 09/09/22 0323  WBC 4.7 4.3 3.1* 6.3 5.5  NEUTROABS 3.1  --  2.1 3.6  --   HGB 12.7 11.3* 12.9  11.8* 11.5*  HCT 38.8 33.2* 38.5 34.5* 33.4*  MCV 96.5 94.6 94.8 92.2 94.4  PLT 66* 62* 71* 82* 75*   Basic Metabolic Panel: Recent Labs  Lab 09/06/22 1531 09/07/22 1234 09/08/22 2249 09/09/22 0323  NA 136 137 133* 137  K 3.2* 3.3* 2.7* 3.5  CL 99 97* 97* 101  CO2 28 28 24 27   GLUCOSE 104* 170* 112* 103*  BUN 8 15 22 17   CREATININE 0.82 0.75 0.73 0.67  CALCIUM 8.9 9.0 8.8* 8.6*   GFR: Estimated Creatinine Clearance: 44.2 mL/min (by C-G formula based on SCr of 0.67 mg/dL). Liver Function Tests: Recent Labs  Lab 09/06/22 1531 09/07/22 1234 09/09/22 0323  AST 45* 39 43*  ALT 19 21 21   ALKPHOS 37* 34* 29*  BILITOT 0.8 0.6 0.6  PROT 6.7 6.1* 5.5*  ALBUMIN 3.3* 2.9* 2.8*   No results for input(s): "LIPASE", "AMYLASE" in the last 168 hours. No results for input(s): "AMMONIA" in the last 168 hours. Coagulation Profile: Recent Labs  Lab 09/06/22 1531  INR 1.1   Cardiac Enzymes: No results for input(s): "CKTOTAL", "CKMB", "CKMBINDEX", "TROPONINI" in the last 168 hours. BNP (last 3 results) No results for input(s): "PROBNP" in the last 8760 hours. HbA1C: No results for input(s): "HGBA1C" in the last 72 hours. CBG: No results for input(s): "GLUCAP" in the last 168 hours. Lipid Profile: No results for input(s): "CHOL", "HDL", "LDLCALC", "TRIG", "CHOLHDL", "LDLDIRECT" in the last 72 hours. Thyroid Function Tests: No results for input(s): "TSH", "T4TOTAL", "FREET4", "T3FREE", "THYROIDAB" in the last 72 hours. Anemia Panel: No results for input(s): "VITAMINB12", "FOLATE", "FERRITIN", "TIBC", "IRON", "RETICCTPCT" in the last 72 hours. Sepsis Labs: Recent Labs  Lab 09/06/22 1531 09/06/22 1824 09/07/22 1234  PROCALCITON  --   --  0.27  LATICACIDVEN 1.2 0.7  --     Recent Results (from the past 240 hour(s))  Resp panel by RT-PCR (RSV, Flu A&B, Covid)     Status: Abnormal   Collection Time: 09/06/22  3:31 PM   Specimen: Nasal Swab  Result Value Ref Range Status    SARS Coronavirus 2 by RT PCR POSITIVE (A) NEGATIVE Final   Influenza A by PCR NEGATIVE NEGATIVE Final   Influenza B by PCR NEGATIVE NEGATIVE Final    Comment: (NOTE) The Xpert Xpress SARS-CoV-2/FLU/RSV plus assay is intended as an aid in the diagnosis of influenza from Nasopharyngeal swab specimens and should not be used as a sole basis for treatment. Nasal washings and aspirates are unacceptable for Xpert Xpress SARS-CoV-2/FLU/RSV testing.  Fact Sheet for Patients: BloggerCourse.comhttps://www.fda.gov/media/152166/download  Fact Sheet for Healthcare Providers: SeriousBroker.ithttps://www.fda.gov/media/152162/download  This test is not yet approved or cleared by the [place] FDA and has been authorized for detection and/or diagnosis of SARS-CoV-2 by FDA under an Emergency Use Authorization (EUA). This EUA will remain in effect (meaning this test can be used) for the duration of the COVID-19 declaration under Section 564(b)(1) of the Act, 21 U.S.C. section 360bbb-3(b)(1), unless the authorization is terminated or revoked.     Resp Syncytial Virus by PCR NEGATIVE NEGATIVE Final    Comment: (NOTE) Fact Sheet for Patients: BloggerCourse.comhttps://www.fda.gov/media/152166/download  Fact Sheet for Healthcare Providers: SeriousBroker.ithttps://www.fda.gov/media/152162/download  This test is not yet approved or cleared by the [place] FDA and  has been authorized for detection and/or diagnosis of SARS-CoV-2 by FDA under an Emergency Use Authorization (EUA). This EUA will remain in effect (meaning this test can be used) for the duration of the COVID-19 declaration under Section 564(b)(1) of the Act, 21 U.S.C. section 360bbb-3(b)(1), unless the authorization is terminated or revoked.  Performed at [facility], [address], [place], [place] [address]   Blood Culture (routine x 2)     Status: None (Preliminary result)   Collection Time: 09/06/22  3:39 PM   Specimen: BLOOD RIGHT FOREARM  Result Value Ref Range Status    Specimen Description BLOOD RIGHT FOREARM  Final   Special Requests   Final    BOTTLES DRAWN AEROBIC AND ANAEROBIC Blood Culture results may not be optimal due to an inadequate volume of blood received in culture bottles   Culture   Final    NO GROWTH 2 DAYS Performed at [facility], [address] [place], [place] [address]    Report Status PENDING  Incomplete  Blood Culture (routine x 2)     Status: None (Preliminary result)   Collection Time: 09/06/22  4:05 PM   Specimen: BLOOD LEFT HAND  Result Value Ref Range Status   Specimen Description BLOOD LEFT HAND  Final   Special Requests   Final    BOTTLES DRAWN AEROBIC AND ANAEROBIC Blood Culture results may not be optimal due to an inadequate volume of blood received in culture bottles   Culture   Final    NO GROWTH 2 DAYS Performed at [facility], [address], [place], [place] [address]    Report Status PENDING  Incomplete         Radiology Studies: CT Head Wo Contrast  Result Date: 09/08/2022 CLINICAL DATA:  Frequent falls including multiple falls today in every day for the last week. Head trauma, moderate-severe EXAM: CT HEAD WITHOUT CONTRAST TECHNIQUE: Contiguous axial images were obtained from the base of the skull through the vertex without intravenous contrast. RADIATION DOSE REDUCTION: This exam was performed according to the departmental dose-optimization program which includes automated exposure control, adjustment of the mA and/or kV according to patient size and/or use of iterative reconstruction technique. COMPARISON:  CT head 09/06/2022 FINDINGS: Brain: No intracranial hemorrhage, mass effect, or evidence of acute infarct. No hydrocephalus. No extra-axial fluid collection. Generalized cerebral atrophy. Ill-defined hypoattenuation within the cerebral white matter is nonspecific but consistent with chronic small vessel ischemic disease. Moderate-sized area of encephalomalacia in the right vertex and evidence of  prior craniotomy. History of meningioma resection. Vascular: No hyperdense vessel. Intracranial arterial calcification. Skull: No fracture or focal lesion. Remote craniotomy at the vertex. Sinuses/Orbits: No acute finding. Mucosal thickening in the ethmoid air cells and left-greater-than-right maxillary sinuses. Air-fluid level in the left maxillary sinus. Other: None. IMPRESSION: 1. No acute intracranial abnormality. 2. Paranasal sinus disease with air-fluid level in the left maxillary sinus. Correlate for acute sinusitis. Electronically Signed   By: Minerva Fester M.D.   On: 09/08/2022 23:05   DG Chest 2 View  Result Date: 09/08/2022 CLINICAL DATA:  Frequent falls. Evaluate for pneumonia. EXAM: CHEST - 2 VIEW COMPARISON:  Chest radiograph 09/06/2022 FINDINGS: Slight worsening heterogeneous airspace disease in the left mid lower lung zone, although partially obscured on the frontal view due to overlying monitoring devices. The heart is normal in size with stable mediastinal contours. No pneumothorax or significant pleural effusion. No pulmonary edema. No acute osseous abnormalities are seen.  No visualized rib fractures. IMPRESSION: Slight worsening heterogeneous airspace disease in the left mid and lower lung zone, suspicious for pneumonia. Electronically Signed   By: Narda Rutherford M.D.   On: 09/08/2022 22:36   DG Hips Bilat W or Wo Pelvis 3-4 Views  Result Date: 09/08/2022 CLINICAL DATA:  Frequent falls. Multiple falls today. Acute on chronic left hip pain. EXAM: DG HIP (WITH OR WITHOUT PELVIS) 3-4V BILAT COMPARISON:  Pelvis and left hip radiographs yesterday. FINDINGS: No acute fracture of the pelvis or hips. Moderate left and mild to moderate right hip osteoarthritis with joint space narrowing and spurring. The pubic rami are intact. No pubic symphysis or sacroiliac joint diastasis. No focal bone abnormalities. IMPRESSION: 1. No fracture of the pelvis or hips. 2. Hip osteoarthritis is unchanged from  yesterday's exam. Electronically Signed   By: Narda Rutherford M.D.   On: 09/08/2022 22:33   DG HIP UNILAT WITH PELVIS 2-3 VIEWS LEFT  Result Date: 09/07/2022 CLINICAL DATA:  Hip pain. EXAM: DG HIP (WITH OR WITHOUT PELVIS) 2-3V LEFT COMPARISON:  AP pelvis 09/06/2022 FINDINGS: Frontal view of the pelvis and frontal and lateral views of the left hip. The bilateral sacroiliac joint spaces are maintained. Mild pubic symphysis joint space narrowing. Moderate bilateral superomedial femoroacetabular joint space narrowing. Mild left femoral head-neck junction circumferential degenerative spurs. Moderate left and mild right superolateral acetabular degenerative osteophytes. Small well corticated chronic ossicle lateral to the superior left acetabulum. No acute fracture or dislocation. Mild-to-moderate atherosclerotic calcifications. 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 Electronically Signed   By: Neita Garnet M.D.   On: 09/07/2022 12:34        Scheduled Meds:  clobetasol   Topical BID   diclofenac Sodium  2 g Topical QID   enoxaparin (LOVENOX) injection  40 mg Subcutaneous Q24H   fluticasone   Topical BID   folic acid  1 mg Oral Daily   gabapentin  100 mg Oral QHS   hydrochlorothiazide  25 mg Oral Daily   leucovorin  15 mg Oral Daily   levothyroxine  50 mcg Oral Q0600   methylPREDNISolone (SOLU-MEDROL) injection  80 mg Intravenous Daily   potassium chloride  20 mEq Oral BID   primidone  50 mg Oral QHS   salsalate  1,500 mg Oral BID   timolol  1 drop Both Eyes BID   Continuous Infusions:  sodium chloride 100 mL/hr at 09/09/22 0131   azithromycin 500 mg (09/09/22 0008)   azithromycin     cefTRIAXone (ROCEPHIN)  IV            Glade Lloyd, MD [facility] 09/09/2022, 10:06 AM

## 2022-09-09 NOTE — Evaluation (Signed)
Occupational Therapy Evaluation Patient Details Name: Kayla Hurley MRN: [id number] DOB: [birth year] Today's Date: 09/09/2022   History of Present Illness 88 yo female adm 4/4 with additional onset of fall at home.  Patient was at [facility] ib 4/2 for Covid, discharged home with [facility], and had an additional fall.  All x-rays have been negative.  Patient now has PNA from susp Covid and community exposures.  Was in hypoxia at admission with thrombocytopenia, all from Covid.  Has L hip, knee and sacral pain with no acute fractures post fall.  PMHx:  COPD, neuropathy, hypothyroidism, rheumatoid arthritis on methotrexate, osteoporosis, hypertension, essential tremor, glaucoma, BPPV.   Clinical Impression   Patient admitted for the diagnosis above.  PTA she lives alone, and has multiple sister's that stop by and can assist as needed.  In addition, her son is flying in from [place] to assist short term.  Patient is up and moving well in the room needing up to [facility] for mobility and ADL completion from a sit to stand level.  Patient should progress well, and home with family assist and home based post acute rehab is recommended.  OT in the acute setting is indicated to address deficits listed.        Recommendations for follow up therapy are one component of a multi-disciplinary discharge planning process, led by the attending physician.  Recommendations may be updated based on patient status, additional functional criteria and insurance authorization.   Assistance Recommended at Discharge Intermittent Supervision/Assistance  Patient can return home with the following A little help with walking and/or transfers;A little help with bathing/dressing/bathroom;Assistance with cooking/housework;Assist for transportation;Help with stairs or ramp for entrance    Functional Status Assessment  Patient has had a recent decline in their functional status and demonstrates the ability to make significant improvements in  function in a reasonable and predictable amount of time.  Equipment Recommendations  BSC/3in1    Recommendations for Other Services       Precautions / Restrictions Precautions Precautions: Fall Precaution Comments: Covid + Restrictions Weight Bearing Restrictions: No      Mobility Bed Mobility Overal bed mobility: Modified Independent                  Transfers Overall transfer level: Needs assistance Equipment used: Rolling walker (2 wheels), 1 person hand held assist Transfers: Sit to/from Stand, Bed to chair/wheelchair/BSC Sit to Stand: Supervision, Min guard     Step pivot transfers: Supervision     General transfer comment: from bed, chair and toilet.  Patient able to also walk in the room without an AD, reaching for objects and a little unsteady but no LOB      Balance   Sitting-balance support: Feet supported Sitting balance-Leahy Scale: Normal     Standing balance support: Reliant on assistive device for balance Standing balance-Leahy Scale: Fair                             ADL either performed or assessed with clinical judgement   ADL       Grooming: Supervision/safety;Standing   Upper Body Bathing: Set up;Sitting   Lower Body Bathing: Min guard;Sit to/from stand   Upper Body Dressing : Set up;Sitting   Lower Body Dressing: Min guard;Sit to/from stand   Toilet Transfer: Ambulation;Rolling walker (2 wheels);Supervision/safety   Toileting- Clothing Manipulation and Hygiene: Supervision/safety;Sitting/lateral lean  Vision Baseline Vision/History: 1 Wears glasses Patient Visual Report: No change from baseline       Perception     Praxis      Pertinent Vitals/Pain Pain Assessment Pain Assessment: Faces Faces Pain Scale: Hurts little more Pain Location: L hip with mobility Pain Descriptors / Indicators: Discomfort, Guarding Pain Intervention(s): Monitored during session     Hand Dominance  Right   Extremity/Trunk Assessment Upper Extremity Assessment Upper Extremity Assessment: Overall WFL for tasks assessed   Lower Extremity Assessment Lower Extremity Assessment: Defer to PT evaluation   Cervical / Trunk Assessment Cervical / Trunk Assessment: Normal   Communication Communication Communication: HOH   Cognition Arousal/Alertness: Awake/alert Behavior During Therapy: WFL for tasks assessed/performed Overall Cognitive Status: Within Functional Limits for tasks assessed                                       General Comments   VSS on RA    Exercises     Shoulder Instructions      Home Living Family/patient expects to be discharged to:: Private residence Living Arrangements: Alone Available Help at Discharge: Family;Friend(s);Available PRN/intermittently Type of Home: House Home Access: Stairs to enter [employer] of Steps: 1 Entrance Stairs-Rails: Right Home Layout: One level     Bathroom Shower/Tub: Chief Strategy Officer: Standard Bathroom Accessibility: Yes How Accessible: Accessible via walker Home Equipment: Cane - single point;Grab bars - tub/shower   Additional Comments: 2 dogs at home, son is flying in from [place] to stay with her for a week, then sister's will be assisting as needed.      Prior Functioning/Environment Prior Level of Function : Independent/Modified Independent             Mobility Comments: driving at baseline living alone ADLs Comments: Ind with AD, iADL        OT Problem List: Decreased strength;Decreased range of motion;Decreased activity tolerance;Impaired balance (sitting and/or standing);Decreased safety awareness;Decreased knowledge of use of DME or AE;Decreased knowledge of precautions;Pain      OT Treatment/Interventions: Self-care/ADL training;Therapeutic exercise;DME and/or AE instruction;Therapeutic activities;Patient/family education;Balance training    OT  Goals(Current goals can be found in the care plan section) Acute Rehab OT Goals Patient Stated Goal: Return home OT Goal Formulation: With patient Time For Goal Achievement: 09/23/22 Potential to Achieve Goals: Good ADL Goals Pt Will Perform Grooming: with modified independence;standing Pt Will Perform Lower Body Dressing: with modified independence;sit to/from stand Pt Will Transfer to Toilet: with modified independence;ambulating;regular height toilet  OT Frequency: Min 2X/week    Co-evaluation              AM-PAC OT "6 Clicks" Daily Activity     Outcome Measure Help from another person eating meals?: None Help from another person taking care of personal grooming?: A Little Help from another person toileting, which includes using toliet, bedpan, or urinal?: A Little Help from another person bathing (including washing, rinsing, drying)?: A Little Help from another person to put on and taking off regular upper body clothing?: A Little Help from another person to put on and taking off regular lower body clothing?: A Little 6 Click Score: 19   End of Session Equipment Utilized During Treatment: Rolling walker (2 wheels) Nurse Communication: Mobility status  Activity Tolerance: Patient tolerated treatment well Patient left: in chair;with call bell/phone within reach;with chair alarm set  OT Visit Diagnosis:  Unsteadiness on feet (R26.81);Other abnormalities of gait and mobility (R26.89);Muscle weakness (generalized) (M62.81);History of falling (Z91.81);Pain Pain - Right/Left: Left Pain - part of body: Hip                Time: 1335-1400 OT Time Calculation (min): 25 min Charges:  OT General Charges $OT Visit: 1 Visit OT Evaluation $OT Eval Moderate Complexity: 1 Mod OT Treatments $Self Care/Home Management : 8-22 mins  09/09/2022  RP, OTR/L  Acute Rehabilitation Services  Office:  [phone]   Suzanna ObeyRichard D Kortnie Stovall 09/09/2022, 2:12 PM

## 2022-09-09 NOTE — Plan of Care (Signed)
Plan of care initiated and discussed. 

## 2022-09-09 NOTE — Progress Notes (Signed)
Called and spoke with Kayla Hurley , the patient's son and POA.  Updated him on the basic treatment plan for his mom and her current status.  Questions answered.  Mr. Kayla Hurley would like updates as warranted and he stated he planned to travel here from [place] on Monday.

## 2022-09-09 NOTE — Evaluation (Signed)
Physical Therapy Evaluation Patient Details Name: Kayla Hurley MRN: [id number] DOB: Apr 15, 1935 Today's Date: 09/09/2022  History of Present Illness  88 yo female adm 4/4 with additional onset of fall at home.  Patient was at [facility] ib 4/2 for Covid, discharged home with [facility], and had an additional fall.  All x-rays have been negative.  Patient now has PNA from susp Covid and community exposures.  Was in hypoxia at admission with thrombocytopenia, all from Covid.  Has L hip, knee and sacral pain with no acute fractures post fall.  PMHx:  COPD, neuropathy, hypothyroidism, rheumatoid arthritis on methotrexate, osteoporosis, hypertension, essential tremor, glaucoma, BPPV.  Clinical Impression  Pt admitted with above diagnosis.  Pt currently with functional limitations due to the deficits listed below (see PT Problem List). Pt will benefit from acute skilled PT to increase their independence and safety with mobility to allow discharge.     The patient reporting increased pain in LLE , has been sitting in recliner for awhile. Patient is slightly unsteady with ambulation, did not want to use RW. Patient may benefit from RW until pain reduced. Patient will benefit rom PT  after DC.s     Recommendations for follow up therapy are one component of a multi-disciplinary discharge planning process, led by the attending physician.  Recommendations may be updated based on patient status, additional functional criteria and insurance authorization.  Follow Up Recommendations       Assistance Recommended at Discharge Frequent or constant Supervision/Assistance  Patient can return home with the following  A little help with bathing/dressing/bathroom;Assistance with cooking/housework;Assist for transportation    Equipment Recommendations Rolling walker (2 wheels)  Recommendations for Other Services       Functional Status Assessment Patient has had a recent decline in their functional status and demonstrates the  ability to make significant improvements in function in a reasonable and predictable amount of time.     Precautions / Restrictions Precautions Precautions: Fall Precaution Comments: Covid + Restrictions Weight Bearing Restrictions: No      Mobility  Bed Mobility   Bed Mobility: Sit to Supine     Supine to sit: Min assist     General bed mobility comments: assist both legs back onto bed    Transfers Overall transfer level: Needs assistance Equipment used: Rolling walker (2 wheels), 1 person hand held assist Transfers: Sit to/from Stand Sit to Stand: Min assist           General transfer comment: assist both legs onto bed due to reports pain    Ambulation/Gait Ambulation/Gait assistance: Min assist Gait Distance (Feet): 20 Feet (then 10) Assistive device: Rolling walker (2 wheels) Gait Pattern/deviations: Step-through pattern, Decreased stride length Gait velocity: reduced     General Gait Details: patient did not want to use RW, gait unsteady at times  Stairs            Wheelchair Mobility    Modified Rankin (Stroke Patients Only)       Balance Overall balance assessment: Mild deficits observed, not formally tested Sitting-balance support: Feet supported Sitting balance-Leahy Scale: Normal     Standing balance support: Reliant on assistive device for balance Standing balance-Leahy Scale: Fair Standing balance comment: walker is beneficial for dynamic balance                             Pertinent Vitals/Pain Pain Assessment Faces Pain Scale: Hurts even more Pain Location: L hip with mobility  and sitting in recliner Pain Descriptors / Indicators: Discomfort, Guarding Pain Intervention(s): Monitored during session    Home Living Family/patient expects to be discharged to:: Private residence Living Arrangements: Alone Available Help at Discharge: Family;Friend(s);Available 24 hours/day Type of Home: House Home Access: Stairs  to enter Entrance Stairs-Rails: Right Entrance Stairs-Number of Steps: 1   Home Layout: One level Home Equipment: Cane - single point;Grab bars - tub/shower Additional Comments: 2 dogs at home, son is flying in from [place] to stay with her for a week, then sister's will be assisting as needed.    Prior Function Prior Level of Function : Independent/Modified Independent             Mobility Comments: driving at baseline living alone ADLs Comments: Ind with AD, iADL     Hand Dominance   Dominant Hand: Right    Extremity/Trunk Assessment   Upper Extremity Assessment Upper Extremity Assessment: Overall WFL for tasks assessed    Lower Extremity Assessment Lower Extremity Assessment: Generalized weakness (antalgic on the LLE)    Cervical / Trunk Assessment Cervical / Trunk Assessment: Normal  Communication   Communication: HOH  Cognition Arousal/Alertness: Awake/alert Behavior During Therapy: WFL for tasks assessed/performed Overall Cognitive Status: Within Functional Limits for tasks assessed                                          General Comments      Exercises     Assessment/Plan    PT Assessment Patient needs continued PT services  PT Problem List Decreased strength;Decreased balance;Decreased mobility;Pain       PT Treatment Interventions DME instruction;Gait training;Stair training;Functional mobility training;Therapeutic activities;Therapeutic exercise;Balance training;Neuromuscular re-education;Patient/family education    PT Goals (Current goals can be found in the Care Plan section)  Acute Rehab PT Goals Patient Stated Goal: reduce L hip pain PT Goal Formulation: With patient Time For Goal Achievement: 09/23/22 Potential to Achieve Goals: Good    Frequency Min 3X/week     Co-evaluation               AM-PAC PT "6 Clicks" Mobility  Outcome Measure Help needed turning from your back to your side while in a flat bed  without using bedrails?: None Help needed moving from lying on your back to sitting on the side of a flat bed without using bedrails?: A Little Help needed moving to and from a bed to a chair (including a wheelchair)?: A Little Help needed standing up from a chair using your arms (e.g., wheelchair or bedside chair)?: A Little Help needed to walk in hospital room?: A Lot Help needed climbing 3-5 steps with a railing? : A Lot 6 Click Score: 17    End of Session   Activity Tolerance: Patient limited by pain;Patient tolerated treatment well Patient left: in bed;with call bell/phone within reach;with bed alarm set;with family/visitor present Nurse Communication: Mobility status PT Visit Diagnosis: Unsteadiness on feet (R26.81);Muscle weakness (generalized) (M62.81);Difficulty in walking, not elsewhere classified (R26.2)    Time: 4734-0370 PT Time Calculation (min) (ACUTE ONLY): 19 min   Charges:   PT Evaluation $PT Eval Low Complexity: 1 Low          Blanchard Kelch PT Acute Rehabilitation Services Office [phone] Weekend [phone]   Rada Hay 09/09/2022, 4:41 PM

## 2022-09-10 DIAGNOSIS — J189 Pneumonia, unspecified organism: Secondary | ICD-10-CM | POA: Diagnosis not present

## 2022-09-10 DIAGNOSIS — U071 COVID-19: Secondary | ICD-10-CM | POA: Diagnosis not present

## 2022-09-10 LAB — CBC WITH DIFFERENTIAL/PLATELET
Abs Immature Granulocytes: 0.21 10*3/uL — ABNORMAL HIGH (ref 0.00–0.07)
Basophils Absolute: 0 10*3/uL (ref 0.0–0.1)
Basophils Relative: 0 %
Eosinophils Absolute: 0 10*3/uL (ref 0.0–0.5)
Eosinophils Relative: 0 %
HCT: 35.6 % — ABNORMAL LOW (ref 36.0–46.0)
Hemoglobin: 11.8 g/dL — ABNORMAL LOW (ref 12.0–15.0)
Immature Granulocytes: 4 %
Lymphocytes Relative: 15 %
Lymphs Abs: 0.9 10*3/uL (ref 0.7–4.0)
MCH: 31.1 pg (ref 26.0–34.0)
MCHC: 33.1 g/dL (ref 30.0–36.0)
MCV: 93.9 fL (ref 80.0–100.0)
Monocytes Absolute: 1.7 10*3/uL — ABNORMAL HIGH (ref 0.1–1.0)
Monocytes Relative: 30 %
Neutro Abs: 2.9 10*3/uL (ref 1.7–7.7)
Neutrophils Relative %: 51 %
Platelets: 81 10*3/uL — ABNORMAL LOW (ref 150–400)
RBC: 3.79 MIL/uL — ABNORMAL LOW (ref 3.87–5.11)
RDW: 15.2 % (ref 11.5–15.5)
WBC: 5.8 10*3/uL (ref 4.0–10.5)
nRBC: 0 % (ref 0.0–0.2)

## 2022-09-10 LAB — COMPREHENSIVE METABOLIC PANEL
ALT: 21 U/L (ref 0–44)
AST: 40 U/L (ref 15–41)
Albumin: 2.7 g/dL — ABNORMAL LOW (ref 3.5–5.0)
Alkaline Phosphatase: 26 U/L — ABNORMAL LOW (ref 38–126)
Anion gap: 8 (ref 5–15)
BUN: 15 mg/dL (ref 8–23)
CO2: 28 mmol/L (ref 22–32)
Calcium: 8.6 mg/dL — ABNORMAL LOW (ref 8.9–10.3)
Chloride: 100 mmol/L (ref 98–111)
Creatinine, Ser: 0.58 mg/dL (ref 0.44–1.00)
GFR, Estimated: >60 mL/min (ref >60)
Glucose, Bld: 99 mg/dL (ref 70–99)
Potassium: 4.1 mmol/L (ref 3.5–5.1)
Sodium: 136 mmol/L (ref 135–145)
Total Bilirubin: 0.5 mg/dL (ref 0.3–1.2)
Total Protein: 5.3 g/dL — ABNORMAL LOW (ref 6.5–8.1)

## 2022-09-10 LAB — MAGNESIUM: Magnesium: 1.9 mg/dL (ref 1.7–2.4)

## 2022-09-10 LAB — PROCALCITONIN: Procalcitonin: 0.12 ng/mL

## 2022-09-10 LAB — C-REACTIVE PROTEIN: CRP: 6.8 mg/dL — ABNORMAL HIGH (ref <1.0)

## 2022-09-10 MED ORDER — FLUTICASONE PROPIONATE 0.05 % EX CREA: TOPICAL_CREAM | Freq: Two times a day (BID) | CUTANEOUS | Status: DC | PRN

## 2022-09-10 MED ORDER — TRAMADOL HCL 50 MG PO TABS
50.0000 mg | ORAL_TABLET | Freq: Four times a day (QID) | ORAL | Status: DC | PRN
  Administered 2022-09-10 – 2022-09-11 (×3): 50 mg via ORAL
  Filled 2022-09-10 (×3): qty 1

## 2022-09-10 MED ORDER — METHOCARBAMOL 500 MG PO TABS
500.0000 mg | ORAL_TABLET | Freq: Four times a day (QID) | ORAL | Status: DC | PRN
  Administered 2022-09-10 – 2022-09-11 (×3): 500 mg via ORAL
  Filled 2022-09-10 (×3): qty 1

## 2022-09-10 MED ORDER — CLOBETASOL PROPIONATE 0.05 % EX SOLN: Freq: Two times a day (BID) | CUTANEOUS | Status: DC | PRN

## 2022-09-10 NOTE — Progress Notes (Signed)
Mobility Specialist - Progress Note   09/10/22 1051  Mobility  Activity Ambulated with assistance in hallway  Level of Assistance Minimal assist, patient does 75% or more  Assistive Device Front wheel walker  Distance Ambulated (ft) 350 ft  Activity Response Tolerated well  Mobility Referral Yes  $Mobility charge 1 Mobility   Pt received in bed and eager to ambulate. Pt c/o of soreness on sacrum and L hip. At EOS, pt returned to room and left with all needs in reach and family in room.   Arliss Journey Mobility Specialist Acute Rehabilitation Services Phone: [phone] 09/10/22, 10:54 AM

## 2022-09-10 NOTE — Plan of Care (Signed)
  Problem: Clinical Measurements: Goal: Ability to maintain clinical measurements within normal limits will improve Outcome: Progressing   Problem: Clinical Measurements: Goal: Will remain free from infection Outcome: Progressing   Problem: Clinical Measurements: Goal: Diagnostic test results will improve Outcome: Progressing   

## 2022-09-10 NOTE — Consult Note (Signed)
Consultation Note Date: 09/10/2022   Patient Name: Kayla Hurley  DOB: 1934/12/21  MRN: [id number]  Age / Sex: 88 y.o., female  PCP: Pcp, No Referring Physician: Glade LloydAlekh, Kshitiz, MD  Reason for Consultation: Establishing goals of care  HPI/Patient Profile: 88 y.o. female   admitted on 09/08/2022   Clinical Assessment and Goals of Care: 88 year old lady who has rheumatoid arthritis COPD hypertensive retinopathy and macular degeneration.  She was recently in the hospital for COVID-19 pneumonia, patient presented with multiple episodes of fall weakness and was found to have heterogeneous airspace disease left mid and lower lung zone suspicious for pneumonia.  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 Patient remains admitted to hospital medicine service, is on IV antibiotics and steroids. Palliative consult for CODE STATUS and broad goals of care discussions has been requested. Patient noted to be outside her room, ambulating on the nursing unit with assistance for mobility specialist.  Patient arrives back inside her room.  Niece and nephew are visiting from [place] and are also present at bedside. Palliative medicine is specialized medical care for people living with serious illness. It focuses on providing relief from the symptoms and stress of a serious illness. The goal is to improve quality of life for both the patient and the family. Goals of care: Broad aims of medical therapy in relation to the patient's values and preferences. Our aim is to provide medical care aimed at enabling patients to achieve the goals that matter most to them, given  the circumstances of their particular medical situation and their constraints.    NEXT OF KIN Son who lives in [place], Sister Bonita QuinLinda lives locally, niece and nephew live in [place] and also provide support.  SUMMARY OF RECOMMENDATIONS   Full code/full scope, patient is awaiting the arrival of her son from [place] in a couple of days, [facility] for completion of advance care planning documents to the patient's son gets in town and after discharge. Continue current mode of care.  Code Status/Advance Care Planning: Full code   Symptom Management:     Palliative Prophylaxis:  Delirium Protocol  Additional Recommendations (Limitations, Scope, Preferences): Full Scope Treatment  Psycho-social/Spiritual:  Desire for further Chaplaincy support:yes Additional Recommendations: Caregiving  Support/Resources  Prognosis:  Unable to determine  Discharge Planning: To Be Determined      Primary Diagnoses: Present on Admission:  Essential tremor  COPD with asthma  Thrombocytopenia  COVID-19 virus infection  Dyslipidemia  Hypothyroidism  Rheumatoid arthritis  HCAP (healthcare-associated pneumonia)  Hypokalemia  Hyponatremia   I have reviewed the medical record, interviewed the patient and family, and examined the patient. The following aspects are pertinent.  Past Medical History:  Diagnosis Date   COPD (chronic obstructive pulmonary disease)    Glaucoma    Hypertensive  retinopathy    Macular degeneration    Meningioma    Rheumatoid arthritis    Social History   Socioeconomic History   Marital status: Widowed    Spouse name: Not on file   Number of children: Not on file   Years of education: Not on file   Highest education level: Not on file  Occupational History   Not on file  Tobacco Use   Smoking status: Former    Packs/day: 1.00    Years: 50.00    Additional pack years: 0.00    Total pack  years: 50.00    Types: Cigarettes   Smokeless tobacco: Never   Tobacco comments:    Quit smoking 1990's  Vaping Use   Vaping Use: Never used  Substance and Sexual Activity   Alcohol use: Not Currently    Comment: occasional   Drug use: Never   Sexual activity: Not Currently  Other Topics Concern   Not on file  Social History Narrative   Right handed    Social Determinants of Health   Financial Resource Strain: Not on file  Food Insecurity: No Food Insecurity (09/09/2022)   Hunger Vital Sign    Worried About Running Out of Food in the Last Year: Never true    Ran Out of Food in the Last Year: Never true  Transportation Needs: No Transportation Needs (09/09/2022)   PRAPARE - Administrator, Civil Service (Medical): No    Lack of Transportation (Non-Medical): No  Physical Activity: Not on file  Stress: Not on file  Social Connections: Not on file   Family History  Problem Relation Age of Onset   Other Mother        died during open heart surgery   Heart disease Father    Lung cancer Maternal Grandfather        smoker   Diabetes Son    Scheduled Meds:  clobetasol   Topical BID   diclofenac Sodium  2 g Topical QID   enoxaparin (LOVENOX) injection  40 mg Subcutaneous Q24H   fluticasone   Topical BID   folic acid  1 mg Oral Daily   gabapentin  100 mg Oral QHS   leucovorin  15 mg Oral Daily   levothyroxine  50 mcg Oral Q0600   methylPREDNISolone (SOLU-MEDROL) injection  80 mg Intravenous Daily   mometasone-formoterol  2 puff Inhalation BID   potassium chloride  20 mEq Oral BID   primidone  50 mg Oral QHS   salsalate  1,500 mg Oral BID   timolol  1 drop Both Eyes BID   Continuous Infusions:  azithromycin 500 mg (09/09/22 2229)   cefTRIAXone (ROCEPHIN)  IV 2 g (09/09/22 2058)   PRN Meds:.acetaminophen, alum & mag hydroxide-simeth, guaiFENesin, methocarbamol, ondansetron (ZOFRAN) IV, traMADol Medications Prior to Admission:  Prior to Admission medications    Medication Sig Start Date End Date Taking? Authorizing Provider  VENTOLIN HFA 108 (90 Base) MCG/ACT inhaler Inhale 1 puff into the lungs every 4 (four) hours as needed for shortness of breath.   Yes [provider]  acetaminophen (TYLENOL) 325 MG tablet Take 2 tablets (650 mg total) by mouth every 6 (six) hours as needed for mild pain (or Fever >/= 101). 09/07/22   Evette Georges, MD  Calcium-Phosphorus-Vitamin D (CITRACAL +D3) [phone] MG-MG-UNIT CHEW See admin instructions.    [provider]  clobetasol (TEMOVATE) 0.05 % external solution Apply 1 Application topically 2 (two) times daily as needed (as  directed- to affected area(s); avoid face/groin/underarms). 08/28/22   [provider]  diclofenac Sodium (VOLTAREN) 1 % GEL Apply topically. 11/13/17   [provider]  fluticasone (CUTIVATE) 0.05 % cream Apply 1 Application topically 2 (two) times daily as needed (as directed- to affected area(s)). 08/29/22   [provider]  folic acid (FOLVITE) 1 MG tablet Take 1 mg by mouth daily. Take two tablets daily    [provider]  gabapentin (NEURONTIN) 100 MG capsule Take 1 capsule (100 mg total) by mouth at bedtime. 08/04/22   Tat, Octaviano Batty, DO  hydrochlorothiazide (HYDRODIURIL) 25 MG tablet Take 25 mg by mouth daily. 12/30/20   [provider]  ketoconazole (NIZORAL) 2 % cream Apply 1 Application topically daily as needed for irritation. 08/11/22   [provider]  leucovorin (WELLCOVORIN) 5 MG tablet Take 10 mg by mouth once a week. 08/12/19   [provider]  levothyroxine (SYNTHROID) 50 MCG tablet Take 50 mcg by mouth daily before breakfast.    [provider]  methotrexate (RHEUMATREX) 2.5 MG tablet Take 10 mg by mouth once a week. Caution:Chemotherapy. Protect from light.    [provider]  Multiple Vitamins-Minerals (PRESERVISION AREDS 2+MULTI VIT) CAPS See admin instructions.    [provider]   potassium chloride (KLOR-CON) 10 MEQ tablet Take 10 mEq by mouth 2 (two) times daily. 09/06/22   [provider]  primidone (MYSOLINE) 50 MG tablet TAKE 1 TABLET BY MOUTH AT BEDTIME Patient taking differently: Take 50 mg by mouth at bedtime. TAKE 1 TABLET BY MOUTH AT BEDTIME 07/25/22   Tat, Octaviano Batty, DO  Propylene Glycol (SYSTANE BALANCE OP) Apply to eye.    [provider]  salsalate (DISALCID) 750 MG tablet Take 1,500 mg by mouth 2 (two) times daily. 09/02/19   [provider]  timolol (TIMOPTIC) 0.5 % ophthalmic solution Place 1 drop into both eyes 2 (two) times daily. 08/17/22   [provider]   Allergies  Allergen Reactions   Adhesive [Tape] Other (See Comments)    Blisters    Penicillins Diarrhea, Nausea And Vomiting and Rash   Review of Systems +weakness  Physical Exam Elderly lady sitting up in bed A little bit hard of hearing Was seen ambulating with assistance from mobility specialist without any difficulty Not on supplemental oxygen, does not have home oxygen Regular work of breathing No edema Abdomen not distended  Vital Signs: BP (!) 130/59 (BP Location: Left Arm)   Pulse (!) 52   Temp 97.7 F (36.5 C) (Oral)   Resp 18   Ht 5\' 2"  (1.575 m)   Wt 61.2 kg   SpO2 93%   BMI 24.69 kg/m  Pain Scale: 0-10   Pain Score: 5    SpO2: SpO2: 93 % O2 Device:SpO2: 93 % O2 Flow Rate: .   IO: Intake/output summary:  Intake/Output Summary (Last 24 hours) at 09/10/2022 1112 Last data filed at 09/10/2022 0854 Gross per 24 hour  Intake 658.33 ml  Output 1500 ml  Net -841.67 ml    LBM: Last BM Date : 09/08/22 Baseline Weight: Weight: 61.2 kg Most recent weight: Weight: 61.2 kg     Palliative Assessment/Data:   PPS 70%  Time In:  10 Time Out:  11 Time Total:  60  Greater than 50%  of this time was spent counseling and coordinating care related to the above assessment and plan.  Signed by: Rosalin Hawking, MD   Please contact  Palliative  Medicine Team phone at [phone] for questions and concerns.  For individual provider: See Shea Evans

## 2022-09-10 NOTE — Progress Notes (Signed)
PROGRESS NOTE    Kayla Hurley  WUJ:[id number] DOB: 1934-07-05 DOA: 09/08/2022 PCP: Pcp, No   Brief Narrative:  88 y.o. female with medical history significant of rheumatoid arthritis, COPD, hypertensive retinopathy and macular degeneration, recent admission from 09/06/2022-09/07/2022 for COVID-19 pneumonia treated with 2 doses of Decadron presented with multiple episodes of fall along with worsening weakness and heterogeneous airspace disease in the left mid and lower lung zone, suspicious for pneumonia. On presentation, potassium was 2.7; chest x-ray showed slight worsening intrusiveness of airspace disease in the left mid and lower lung zone, suspicious for pneumonia.  CT of the head was negative for acute intracranial abnormity.  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  She was started on IV antibiotics and steroids.  Assessment & Plan:   COVID-19 pneumonia Possible secondary community-acquired bacterial pneumonia -Tested positive for COVID-19 on 09/06/2022 and was admitted from 09/06/2022-09/07/2022 and treated with 2 doses of Decadron.  Presented with worsening weakness and shortness of breath.  Imaging as above. -Currently on IV steroids and antibiotics.  On room air. -Continue monitoring telemetry markers.  Procalcitonin improving. -Continue isolation  Sepsis has been ruled out  Hyponatremia -Possibly from dehydration.  Improved with IV fluids.  Repeat a.m. labs.  Off IV fluids.  Encourage oral intake  Hypokalemia -Improved  Thrombocytopenia -Questionable cause.  No signs of bleeding.  Monitor  Hypothyroidism -Continue Synthroid  Rheumatoid arthritis -Hold home regimen till patient has an active infection  Falls at home Gait instability -PT/OT following and recommending home health PT/OT.  Hypertension -Blood pressure on the lower side. hydrochlorothiazide on hold.  Goals of care -Patient is currently listed as full code.   Palliative care consultation for goals of care discussion  DVT prophylaxis: Lovenox Code Status: Full Family Communication: None at bedside Disposition Plan: Status is: Inpatient Remains inpatient appropriate because: Of severity of illness    Consultants: palliative care  Procedures: None  Antimicrobials: Rocephin and Zithromax from 09/08/2022 onwards   Subjective: Patient seen and examined at bedside.  Hard of hearing.  Complains of intermittent lower extremity pain and pain in tailbone area.  No fever, vomiting reported.  Cough and shortness of breath is improving.   Objective: Vitals:   09/09/22 1359 09/09/22 1955 09/09/22 2151 09/10/22 0433  BP: 120/67  (!) 154/74 (!) 130/59  Pulse: 82  71 (!) 52  Resp: 16  18 18   Temp: (!) 97.5 F (36.4 C) 97.9 F (36.6 C) 98 F (36.7 C) 97.7 F (36.5 C)  TempSrc: Oral Oral Oral Oral  SpO2: 94%  94% 95%  Weight:      Height:        Intake/Output Summary (Last 24 hours) at 09/10/2022 0814 Last data filed at 09/10/2022 0434 Gross per 24 hour  Intake 898.33 ml  Output 850 ml  Net 48.33 ml    Filed Weights   09/08/22 2151  Weight: 61.2 kg    Examination:  General: On room air.  No distress.  Elderly female lying in bed.  Hard of hearing. ENT/neck: No thyromegaly.  JVD is not elevated  respiratory: Decreased breath sounds at bases bilaterally with some crackles; no wheezing  CVS: Mild intermittent bradycardia present; S1 and S2 are heard  abdominal: Soft, nontender, slightly distended; no organomegaly, bowel sounds are heard Extremities: Trace lower extremity edema; no cyanosis  CNS: Awake and alert.  Slow to respond.  Poor historian.  No focal neurologic deficit.  Moves extremities Lymph: No obvious  lymphadenopathy Skin: No obvious ecchymosis/lesions  psych: Not agitated currently.  Affect is flat. musculoskeletal: No obvious joint swelling/deformity     Data Reviewed: I have personally reviewed following labs and  imaging studies  CBC: Recent Labs  Lab 09/06/22 1531 09/06/22 1915 09/07/22 1234 09/08/22 2249 09/09/22 0323 09/10/22 0357  WBC 4.7 4.3 3.1* 6.3 5.5 5.8  NEUTROABS 3.1  --  2.1 3.6  --  2.9  HGB 12.7 11.3* 12.9 11.8* 11.5* 11.8*  HCT 38.8 33.2* 38.5 34.5* 33.4* 35.6*  MCV 96.5 94.6 94.8 92.2 94.4 93.9  PLT 66* 62* 71* 82* 75* 81*    Basic Metabolic Panel: Recent Labs  Lab 09/06/22 1531 09/07/22 1234 09/08/22 2249 09/09/22 0323 09/10/22 0357  NA 136 137 133* 137 136  K 3.2* 3.3* 2.7* 3.5 4.1  CL 99 97* 97* 101 100  CO2 GLUCOSE 104* 170* 112* 103* 99  BUN CREATININE 0.82 0.75 0.73 0.67 0.58  CALCIUM 8.9 9.0 8.8* 8.6* 8.6*  MG  --   --   --   --  1.9    GFR: Estimated Creatinine Clearance: 44.2 mL/min (by C-G formula based on SCr of 0.58 mg/dL). Liver Function Tests: Recent Labs  Lab 09/06/22 1531 09/07/22 1234 09/09/22 0323 09/10/22 0357  AST 45* 39 43* 40  ALT ALKPHOS 37* 34* 29* 26*  BILITOT 0.8 0.6 0.6 0.5  PROT 6.7 6.1* 5.5* 5.3*  ALBUMIN 3.3* 2.9* 2.8* 2.7*    No results for input(s): "LIPASE", "AMYLASE" in the last 168 hours. No results for input(s): "AMMONIA" in the last 168 hours. Coagulation Profile: Recent Labs  Lab 09/06/22 1531  INR 1.1    Cardiac Enzymes: No results for input(s): "CKTOTAL", "CKMB", "CKMBINDEX", "TROPONINI" in the last 168 hours. BNP (last 3 results) No results for input(s): "PROBNP" in the last 8760 hours. HbA1C: No results for input(s): "HGBA1C" in the last 72 hours. CBG: No results for input(s): "GLUCAP" in the last 168 hours. Lipid Profile: No results for input(s): "CHOL", "HDL", "LDLCALC", "TRIG", "CHOLHDL", "LDLDIRECT" in the last 72 hours. Thyroid Function Tests: No results for input(s): "TSH", "T4TOTAL", "FREET4", "T3FREE", "THYROIDAB" in the last 72 hours. Anemia Panel: No results for input(s): "VITAMINB12", "FOLATE", "FERRITIN", "TIBC", "IRON", "RETICCTPCT" in  the last 72 hours. Sepsis Labs: Recent Labs  Lab 09/06/22 1531 09/06/22 1824 09/07/22 1234 09/10/22 0357  PROCALCITON  --   --  0.27 0.12  LATICACIDVEN 1.2 0.7  --   --      Recent Results (from the past 240 hour(s))  Resp panel by RT-PCR (RSV, Flu A&B, Covid)     Status: Abnormal   Collection Time: 09/06/22  3:31 PM   Specimen: Nasal Swab  Result Value Ref Range Status   SARS Coronavirus 2 by RT PCR POSITIVE (A) NEGATIVE Final   Influenza A by PCR NEGATIVE NEGATIVE Final   Influenza B by PCR NEGATIVE NEGATIVE Final    Comment: (NOTE) The Xpert Xpress SARS-CoV-2/FLU/RSV plus assay is intended as an aid in the diagnosis of influenza from Nasopharyngeal swab specimens and should not be used as a sole basis for treatment. Nasal washings and aspirates are unacceptable for Xpert Xpress SARS-CoV-2/FLU/RSV testing.  Fact Sheet for Patients: BloggerCourse.com  Fact Sheet for Healthcare Providers: SeriousBroker.it  This test is not yet approved or cleared by the [place] FDA and has been authorized for detection and/or diagnosis of SARS-CoV-2 by  FDA under an Emergency Use Authorization (EUA). This EUA will remain in effect (meaning this test can be used) for the duration of the COVID-19 declaration under Section 564(b)(1) of the Act, 21 U.S.C. section 360bbb-3(b)(1), unless the authorization is terminated or revoked.     Resp Syncytial Virus by PCR NEGATIVE NEGATIVE Final    Comment: (NOTE) Fact Sheet for Patients: BloggerCourse.com  Fact Sheet for Healthcare Providers: SeriousBroker.it  This test is not yet approved or cleared by the [place] FDA and has been authorized for detection and/or diagnosis of SARS-CoV-2 by FDA under an Emergency Use Authorization (EUA). This EUA will remain in effect (meaning this test can be used) for the duration of the COVID-19  declaration under Section 564(b)(1) of the Act, 21 U.S.C. section 360bbb-3(b)(1), unless the authorization is terminated or revoked.  Performed at [facility], [address] [place], [place] [address]   Blood Culture (routine x 2)     Status: None (Preliminary result)   Collection Time: 09/06/22  3:39 PM   Specimen: BLOOD RIGHT FOREARM  Result Value Ref Range Status   Specimen Description BLOOD RIGHT FOREARM  Final   Special Requests   Final    BOTTLES DRAWN AEROBIC AND ANAEROBIC Blood Culture results may not be optimal due to an inadequate volume of blood received in culture bottles   Culture   Final    NO GROWTH 3 DAYS Performed at [facility], [address] [place], [place] [address]    Report Status PENDING  Incomplete  Blood Culture (routine x 2)     Status: None (Preliminary result)   Collection Time: 09/06/22  4:05 PM   Specimen: BLOOD LEFT HAND  Result Value Ref Range Status   Specimen Description BLOOD LEFT HAND  Final   Special Requests   Final    BOTTLES DRAWN AEROBIC AND ANAEROBIC Blood Culture results may not be optimal due to an inadequate volume of blood received in culture bottles   Culture   Final    NO GROWTH 3 DAYS Performed at [facility], [address], [place], [place] [address]    Report Status PENDING  Incomplete         Radiology Studies: CT Head Wo Contrast  Result Date: 09/08/2022 CLINICAL DATA:  Frequent falls including multiple falls today in every day for the last week. Head trauma, moderate-severe EXAM: CT HEAD WITHOUT CONTRAST TECHNIQUE: Contiguous axial images were obtained from the base of the skull through the vertex without intravenous contrast. RADIATION DOSE REDUCTION: This exam was performed according to the departmental dose-optimization program which includes automated exposure control, adjustment of the mA and/or kV according to patient size and/or use of iterative reconstruction technique. COMPARISON:  CT head  09/06/2022 FINDINGS: Brain: No intracranial hemorrhage, mass effect, or evidence of acute infarct. No hydrocephalus. No extra-axial fluid collection. Generalized cerebral atrophy. Ill-defined hypoattenuation within the cerebral white matter is nonspecific but consistent with chronic small vessel ischemic disease. Moderate-sized area of encephalomalacia in the right vertex and evidence of prior craniotomy. History of meningioma resection. Vascular: No hyperdense vessel. Intracranial arterial calcification. Skull: No fracture or focal lesion. Remote craniotomy at the vertex. Sinuses/Orbits: No acute finding. Mucosal thickening in the ethmoid air cells and left-greater-than-right maxillary sinuses. Air-fluid level in the left maxillary sinus. Other: None. IMPRESSION: 1. No acute intracranial abnormality. 2. Paranasal sinus disease with air-fluid level in the left maxillary sinus. Correlate for acute sinusitis. Electronically Signed   By:  Minerva Festeryler  Stutzman M.D.   On: 09/08/2022 23:05   DG Chest 2 View  Result Date: 09/08/2022 CLINICAL DATA:  Frequent falls. Evaluate for pneumonia. EXAM: CHEST - 2 VIEW COMPARISON:  Chest radiograph 09/06/2022 FINDINGS: Slight worsening heterogeneous airspace disease in the left mid lower lung zone, although partially obscured on the frontal view due to overlying monitoring devices. The heart is normal in size with stable mediastinal contours. No pneumothorax or significant pleural effusion. No pulmonary edema. No acute osseous abnormalities are seen. No visualized rib fractures. IMPRESSION: Slight worsening heterogeneous airspace disease in the left mid and lower lung zone, suspicious for pneumonia. Electronically Signed   By: Narda RutherfordMelanie  Sanford M.D.   On: 09/08/2022 22:36   DG Hips Bilat W or Wo Pelvis 3-4 Views  Result Date: 09/08/2022 CLINICAL DATA:  Frequent falls. Multiple falls today. Acute on chronic left hip pain. EXAM: DG HIP (WITH OR WITHOUT PELVIS) 3-4V BILAT COMPARISON:   Pelvis and left hip radiographs yesterday. FINDINGS: No acute fracture of the pelvis or hips. Moderate left and mild to moderate right hip osteoarthritis with joint space narrowing and spurring. The pubic rami are intact. No pubic symphysis or sacroiliac joint diastasis. No focal bone abnormalities. IMPRESSION: 1. No fracture of the pelvis or hips. 2. Hip osteoarthritis is unchanged from yesterday's exam. Electronically Signed   By: Narda RutherfordMelanie  Sanford M.D.   On: 09/08/2022 22:33        Scheduled Meds:  clobetasol   Topical BID   diclofenac Sodium  2 g Topical QID   enoxaparin (LOVENOX) injection  40 mg Subcutaneous Q24H   fluticasone   Topical BID   folic acid  1 mg Oral Daily   gabapentin  100 mg Oral QHS   leucovorin  15 mg Oral Daily   levothyroxine  50 mcg Oral Q0600   methylPREDNISolone (SOLU-MEDROL) injection  80 mg Intravenous Daily   mometasone-formoterol  2 puff Inhalation BID   potassium chloride  20 mEq Oral BID   primidone  50 mg Oral QHS   salsalate  1,500 mg Oral BID   timolol  1 drop Both Eyes BID   Continuous Infusions:  azithromycin 500 mg (09/09/22 2229)   cefTRIAXone (ROCEPHIN)  IV 2 g (09/09/22 [id number])          Glade LloydKshitiz Qianna Clagett, MD [facility] 09/10/2022, 8:14 AM

## 2022-09-11 DIAGNOSIS — U071 COVID-19: Secondary | ICD-10-CM | POA: Diagnosis not present

## 2022-09-11 DIAGNOSIS — J189 Pneumonia, unspecified organism: Secondary | ICD-10-CM | POA: Diagnosis not present

## 2022-09-11 LAB — COMPREHENSIVE METABOLIC PANEL
ALT: 21 U/L (ref 0–44)
AST: 33 U/L (ref 15–41)
Albumin: 2.6 g/dL — ABNORMAL LOW (ref 3.5–5.0)
Alkaline Phosphatase: 26 U/L — ABNORMAL LOW (ref 38–126)
Anion gap: 6 (ref 5–15)
BUN: 19 mg/dL (ref 8–23)
CO2: 28 mmol/L (ref 22–32)
Calcium: 8.8 mg/dL — ABNORMAL LOW (ref 8.9–10.3)
Chloride: 102 mmol/L (ref 98–111)
Creatinine, Ser: 0.67 mg/dL (ref 0.44–1.00)
GFR, Estimated: >60 mL/min (ref >60)
Glucose, Bld: 100 mg/dL — ABNORMAL HIGH (ref 70–99)
Potassium: 4.6 mmol/L (ref 3.5–5.1)
Sodium: 136 mmol/L (ref 135–145)
Total Bilirubin: 0.6 mg/dL (ref 0.3–1.2)
Total Protein: 5.5 g/dL — ABNORMAL LOW (ref 6.5–8.1)

## 2022-09-11 LAB — CULTURE, BLOOD (ROUTINE X 2)
Culture: NO GROWTH
Culture: NO GROWTH

## 2022-09-11 LAB — CBC WITH DIFFERENTIAL/PLATELET
Abs Immature Granulocytes: 0.21 10*3/uL — ABNORMAL HIGH (ref 0.00–0.07)
Basophils Absolute: 0 10*3/uL (ref 0.0–0.1)
Basophils Relative: 0 %
Eosinophils Absolute: 0 10*3/uL (ref 0.0–0.5)
Eosinophils Relative: 0 %
HCT: 36.7 % (ref 36.0–46.0)
Hemoglobin: 12.2 g/dL (ref 12.0–15.0)
Immature Granulocytes: 4 %
Lymphocytes Relative: 20 %
Lymphs Abs: 1.1 10*3/uL (ref 0.7–4.0)
MCH: 31.4 pg (ref 26.0–34.0)
MCHC: 33.2 g/dL (ref 30.0–36.0)
MCV: 94.3 fL (ref 80.0–100.0)
Monocytes Absolute: 0.9 10*3/uL (ref 0.1–1.0)
Monocytes Relative: 16 %
Neutro Abs: 3.5 10*3/uL (ref 1.7–7.7)
Neutrophils Relative %: 60 %
Platelets: 117 10*3/uL — ABNORMAL LOW (ref 150–400)
RBC: 3.89 MIL/uL (ref 3.87–5.11)
RDW: 15.2 % (ref 11.5–15.5)
WBC: 5.8 10*3/uL (ref 4.0–10.5)
nRBC: 0 % (ref 0.0–0.2)

## 2022-09-11 LAB — PROCALCITONIN: Procalcitonin: <0.1 ng/mL

## 2022-09-11 LAB — MAGNESIUM: Magnesium: 2.1 mg/dL (ref 1.7–2.4)

## 2022-09-11 LAB — C-REACTIVE PROTEIN: CRP: 3.9 mg/dL — ABNORMAL HIGH (ref <1.0)

## 2022-09-11 MED ORDER — LEUCOVORIN CALCIUM 5 MG PO TABS: 10.0000 mg | ORAL_TABLET | ORAL | Status: DC

## 2022-09-11 MED ORDER — POLYVINYL ALCOHOL 1.4 % OP SOLN: 1.0000 [drp] | OPHTHALMIC | Status: DC | PRN

## 2022-09-11 MED ORDER — GUAIFENESIN-DM 100-10 MG/5ML PO SYRP: 10.0000 mL | ORAL_SOLUTION | ORAL | Status: DC | PRN

## 2022-09-11 MED ORDER — FOLIC ACID 1 MG PO TABS
2.0000 mg | ORAL_TABLET | Freq: Every day | ORAL | Status: DC
  Administered 2022-09-12: 2 mg via ORAL
  Filled 2022-09-11: qty 2

## 2022-09-11 MED ORDER — SENNOSIDES-DOCUSATE SODIUM 8.6-50 MG PO TABS
1.0000 | ORAL_TABLET | Freq: Two times a day (BID) | ORAL | Status: DC
  Administered 2022-09-11: 1 via ORAL
  Filled 2022-09-11 (×3): qty 1

## 2022-09-11 MED ORDER — TIMOLOL MALEATE 0.5 % OP SOLN
1.0000 [drp] | Freq: Two times a day (BID) | OPHTHALMIC | Status: DC
  Administered 2022-09-11 – 2022-09-12 (×2): 1 [drp] via OPHTHALMIC
  Filled 2022-09-11: qty 5

## 2022-09-11 MED ORDER — BISACODYL 5 MG PO TBEC: 5.0000 mg | DELAYED_RELEASE_TABLET | Freq: Every day | ORAL | Status: DC | PRN

## 2022-09-11 NOTE — Plan of Care (Signed)
Plan of care reviewed and discussed. Problem: Education: Goal: Knowledge of General Education information will improve Description: Including pain rating scale, medication(s)/side effects and non-pharmacologic comfort measures Outcome: Progressing   Problem: Health Behavior/Discharge Planning: Goal: Ability to manage health-related needs will improve Outcome: Progressing   Problem: Clinical Measurements: Goal: Ability to maintain clinical measurements within normal limits will improve Outcome: Progressing Goal: Will remain free from infection Outcome: Progressing Goal: Diagnostic test results will improve Outcome: Progressing Goal: Respiratory complications will improve Outcome: Progressing Goal: Cardiovascular complication will be avoided Outcome: Adequate for Discharge   Problem: Activity: Goal: Risk for activity intolerance will decrease Outcome: Adequate for Discharge   Problem: Nutrition: Goal: Adequate nutrition will be maintained Outcome: Completed/Met   Problem: Coping: Goal: Level of anxiety will decrease Outcome: Progressing   Problem: Elimination: Goal: Will not experience complications related to bowel motility Outcome: Progressing   Problem: Pain Managment: Goal: General experience of comfort will improve Outcome: Progressing

## 2022-09-11 NOTE — Plan of Care (Signed)
  Problem: Education: Goal: Knowledge of General Education information will improve Description: Including pain rating scale, medication(s)/side effects and non-pharmacologic comfort measures Outcome: Progressing   Problem: Health Behavior/Discharge Planning: Goal: Ability to manage health-related needs will improve Outcome: Progressing   Problem: Clinical Measurements: Goal: Ability to maintain clinical measurements within normal limits will improve Outcome: Progressing Goal: Will remain free from infection Outcome: Progressing Goal: Diagnostic test results will improve Outcome: Progressing Goal: Respiratory complications will improve Outcome: Progressing Goal: Cardiovascular complication will be avoided Outcome: Progressing   Problem: Activity: Goal: Risk for activity intolerance will decrease Outcome: Progressing   Problem: Coping: Goal: Level of anxiety will decrease Outcome: Progressing   Problem: Elimination: Goal: Will not experience complications related to bowel motility Outcome: Progressing   Problem: Pain Managment: Goal: General experience of comfort will improve Outcome: Progressing   Problem: Safety: Goal: Ability to remain free from injury will improve Outcome: Progressing   Problem: Skin Integrity: Goal: Risk for impaired skin integrity will decrease Outcome: Progressing   Problem: Education: Goal: Knowledge of risk factors and measures for prevention of condition will improve Outcome: Progressing   Problem: Respiratory: Goal: Will maintain a patent airway Outcome: Progressing Goal: Complications related to the disease process, condition or treatment will be avoided or minimized Outcome: Progressing

## 2022-09-11 NOTE — Progress Notes (Signed)
PROGRESS NOTE    Kayla Hurley  ZOX:[id number] DOB: [birth year] DOA: 09/08/2022 PCP: Pcp, No   Brief Narrative:  88 y.o. female with medical history significant of rheumatoid arthritis, COPD, hypertensive retinopathy and macular degeneration, recent admission from 09/06/2022-09/07/2022 for COVID-19 pneumonia treated with 2 doses of Decadron presented with multiple episodes of fall along with worsening weakness and heterogeneous airspace disease in the left mid and lower lung zone, suspicious for pneumonia. On presentation, potassium was 2.7; chest x-ray showed slight worsening intrusiveness of airspace disease in the left mid and lower lung zone, suspicious for pneumonia.  CT of the head was negative for acute intracranial abnormity.  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  She was started on IV antibiotics and steroids.  Assessment & Plan:   COVID-19 pneumonia Possible secondary community-acquired bacterial pneumonia -Tested positive for COVID-19 on 09/06/2022 and was admitted from 09/06/2022-09/07/2022 and treated with 2 doses of Decadron.  Presented with worsening weakness and shortness of breath.  Imaging as above. -Currently on IV steroids and antibiotics.  On room air. -Continue monitoring inflammatory markers.  Procalcitonin less than 0.1 today. COVID-19 Labs  Recent Labs    09/10/22 0357 09/11/22 0335  CRP 6.8* 3.9*    Lab Results  Component Value Date   SARSCOV2NAA POSITIVE (A) 09/06/2022    -Continue isolation  Sepsis has been ruled out  Hyponatremia -Possibly from dehydration.  Improved with IV fluids. Off IV fluids.  Encourage oral intake  Hypokalemia -Improved  Thrombocytopenia -Questionable cause.  No signs of bleeding.  Monitor intermittently  Hypothyroidism -Continue Synthroid  Rheumatoid arthritis -Hold home regimen till patient has an active infection  Falls at home Gait instability -PT/OT following and  recommending home health PT/OT.  Hypertension -Blood pressure on the lower side. hydrochlorothiazide on hold.  Goals of care -Patient is currently listed as full code.  Palliative care evaluation appreciated.  Patient remains full code.  DVT prophylaxis: Lovenox Code Status: Full Family Communication: None at bedside Disposition Plan: Status is: Inpatient Remains inpatient appropriate because: Of severity of illness.  Possible discharge in 1 to 2 days if clinically improves.    Consultants: palliative care  Procedures: None  Antimicrobials: Rocephin and Zithromax from 09/08/2022 onwards   Subjective: Patient seen and examined at bedside.  Hard of hearing.  Breathing and cough are improving.  Still complains of intermittent tailbone and lower extremity pain but improving.  No fever, vomiting reported.  Feels weak.  Not ready to go home today.   Objective: Vitals:   09/10/22 0817 09/10/22 1346 09/10/22 2246 09/11/22 0543  BP:  132/70 (!) 143/58 (!) 112/47  Pulse:  71 64 (!) 54  Resp:  20 19 17   Temp:  (!) 97.5 F (36.4 C) 97.6 F (36.4 C) 97.6 F (36.4 C)  TempSrc:  Oral Oral Oral  SpO2: 93% 90% 93% 95%  Weight:      Height:        Intake/Output Summary (Last 24 hours) at 09/11/2022 0808 Last data filed at 09/11/2022 0100 Gross per 24 hour  Intake 950 ml  Output 650 ml  Net 300 ml    Filed Weights   09/08/22 2151  Weight: 61.2 kg    Examination:  General: No acute distress.  Still on room air.  Elderly female lying in bed.  Hard of hearing. ENT/neck: No JVD elevation or palpable neck masses noted respiratory: Bilateral decreased breath sounds at bases with scattered crackles  CVS: S1-S2  heard; bradycardic intermittently  abdominal: Soft, nontender, distended mildly no organomegaly, bowel sounds are heard normally Extremities: No clubbing; mild lower extremity edema present CNS: Alert and oriented.  Still slow to respond.  Poor historian.  No focal neurologic  deficit.  Able to move extremities  lymph: No cervical lymphadenopathy Skin: No obvious rashes/petechiae  psych: Showing no signs of agitation.  Affect is mostly flat  musculoskeletal: No obvious joint swelling/deformity     Data Reviewed: I have personally reviewed following labs and imaging studies  CBC: Recent Labs  Lab 09/06/22 1531 09/06/22 1915 09/07/22 1234 09/08/22 2249 09/09/22 0323 09/10/22 0357  WBC 4.7 4.3 3.1* 6.3 5.5 5.8  NEUTROABS 3.1  --  2.1 3.6  --  2.9  HGB 12.7 11.3* 12.9 11.8* 11.5* 11.8*  HCT 38.8 33.2* 38.5 34.5* 33.4* 35.6*  MCV 96.5 94.6 94.8 92.2 94.4 93.9  PLT 66* 62* 71* 82* 75* 81*    Basic Metabolic Panel: Recent Labs  Lab 09/07/22 1234 09/08/22 2249 09/09/22 0323 09/10/22 0357 09/11/22 0335  NA 137 133* 137 136 136  K 3.3* 2.7* 3.5 4.1 4.6  CL 97* 97* 101 100 102  CO2 28 24 27 28 28   GLUCOSE 170* 112* 103* 99 100*  BUN 15 22 17 15 19   CREATININE 0.75 0.73 0.67 0.58 0.67  CALCIUM 9.0 8.8* 8.6* 8.6* 8.8*  MG  --   --   --  1.9 2.1    GFR: Estimated Creatinine Clearance: 44.2 mL/min (by C-G formula based on SCr of 0.67 mg/dL). Liver Function Tests: Recent Labs  Lab 09/06/22 1531 09/07/22 1234 09/09/22 0323 09/10/22 0357 09/11/22 0335  AST 45* 39 43* 40 33  ALT 19 21 21 21 21   ALKPHOS 37* 34* 29* 26* 26*  BILITOT 0.8 0.6 0.6 0.5 0.6  PROT 6.7 6.1* 5.5* 5.3* 5.5*  ALBUMIN 3.3* 2.9* 2.8* 2.7* 2.6*    No results for input(s): "LIPASE", "AMYLASE" in the last 168 hours. No results for input(s): "AMMONIA" in the last 168 hours. Coagulation Profile: Recent Labs  Lab 09/06/22 1531  INR 1.1    Cardiac Enzymes: No results for input(s): "CKTOTAL", "CKMB", "CKMBINDEX", "TROPONINI" in the last 168 hours. BNP (last 3 results) No results for input(s): "PROBNP" in the last 8760 hours. HbA1C: No results for input(s): "HGBA1C" in the last 72 hours. CBG: No results for input(s): "GLUCAP" in the last 168 hours. Lipid  Profile: No results for input(s): "CHOL", "HDL", "LDLCALC", "TRIG", "CHOLHDL", "LDLDIRECT" in the last 72 hours. Thyroid Function Tests: No results for input(s): "TSH", "T4TOTAL", "FREET4", "T3FREE", "THYROIDAB" in the last 72 hours. Anemia Panel: No results for input(s): "VITAMINB12", "FOLATE", "FERRITIN", "TIBC", "IRON", "RETICCTPCT" in the last 72 hours. Sepsis Labs: Recent Labs  Lab 09/06/22 1531 09/06/22 1824 09/07/22 1234 09/10/22 0357 09/11/22 0335  PROCALCITON  --   --  0.27 0.12 <0.10  LATICACIDVEN 1.2 0.7  --   --   --      Recent Results (from the past 240 hour(s))  Resp panel by RT-PCR (RSV, Flu A&B, Covid)     Status: Abnormal   Collection Time: 09/06/22  3:31 PM   Specimen: Nasal Swab  Result Value Ref Range Status   SARS Coronavirus 2 by RT PCR POSITIVE (A) NEGATIVE Final   Influenza A by PCR NEGATIVE NEGATIVE Final   Influenza B by PCR NEGATIVE NEGATIVE Final    Comment: (NOTE) The Xpert Xpress SARS-CoV-2/FLU/RSV plus assay is intended as an aid in the diagnosis  of influenza from Nasopharyngeal swab specimens and should not be used as a sole basis for treatment. Nasal washings and aspirates are unacceptable for Xpert Xpress SARS-CoV-2/FLU/RSV testing.  Fact Sheet for Patients: BloggerCourse.com  Fact Sheet for Healthcare Providers: SeriousBroker.it  This test is not yet approved or cleared by the [place] FDA and has been authorized for detection and/or diagnosis of SARS-CoV-2 by FDA under an Emergency Use Authorization (EUA). This EUA will remain in effect (meaning this test can be used) for the duration of the COVID-19 declaration under Section 564(b)(1) of the Act, 21 U.S.C. section 360bbb-3(b)(1), unless the authorization is terminated or revoked.     Resp Syncytial Virus by PCR NEGATIVE NEGATIVE Final    Comment: (NOTE) Fact Sheet for  Patients: BloggerCourse.com  Fact Sheet for Healthcare Providers: SeriousBroker.it  This test is not yet approved or cleared by the [place] FDA and has been authorized for detection and/or diagnosis of SARS-CoV-2 by FDA under an Emergency Use Authorization (EUA). This EUA will remain in effect (meaning this test can be used) for the duration of the COVID-19 declaration under Section 564(b)(1) of the Act, 21 U.S.C. section 360bbb-3(b)(1), unless the authorization is terminated or revoked.  Performed at [facility], [address], [place], [place] [address]   Blood Culture (routine x 2)     Status: None (Preliminary result)   Collection Time: 09/06/22  3:39 PM   Specimen: BLOOD RIGHT FOREARM  Result Value Ref Range Status   Specimen Description BLOOD RIGHT FOREARM  Final   Special Requests   Final    BOTTLES DRAWN AEROBIC AND ANAEROBIC Blood Culture results may not be optimal due to an inadequate volume of blood received in culture bottles   Culture   Final    NO GROWTH 4 DAYS Performed at [facility], [address] [place], [place] [address]    Report Status PENDING  Incomplete  Blood Culture (routine x 2)     Status: None (Preliminary result)   Collection Time: 09/06/22  4:05 PM   Specimen: BLOOD LEFT HAND  Result Value Ref Range Status   Specimen Description BLOOD LEFT HAND  Final   Special Requests   Final    BOTTLES DRAWN AEROBIC AND ANAEROBIC Blood Culture results may not be optimal due to an inadequate volume of blood received in culture bottles   Culture   Final    NO GROWTH 4 DAYS Performed at [facility], [address], [place], [place] [address]    Report Status PENDING  Incomplete         Radiology Studies: No results found.      Scheduled Meds:  diclofenac Sodium  2 g Topical QID   enoxaparin (LOVENOX) injection  40 mg Subcutaneous Q24H   folic acid  1 mg Oral Daily    gabapentin  100 mg Oral QHS   leucovorin  15 mg Oral Daily   levothyroxine  50 mcg Oral Q0600   methylPREDNISolone (SOLU-MEDROL) injection  80 mg Intravenous Daily   mometasone-formoterol  2 puff Inhalation BID   potassium chloride  20 mEq Oral BID   primidone  50 mg Oral QHS   salsalate  1,500 mg Oral BID   timolol  1 drop Both Eyes BID   Continuous Infusions:  azithromycin Stopped (09/11/22 0100)   cefTRIAXone (ROCEPHIN)  IV Stopped (09/10/22 2300)          Glade Lloyd, MD [facility] 09/11/2022, 8:08  AM

## 2022-09-11 NOTE — Progress Notes (Signed)
Physical Therapy Treatment Patient Details Name: Kayla Hurley MRN: [id number] DOB: 1935-01-12 Today's Date: 09/11/2022   History of Present Illness 88 yo female adm 4/4 with additional onset of fall at home.  Patient was at [facility] ib 4/2 for Covid, discharged home with [facility], and had an additional fall.  All x-rays have been negative.  Patient now has PNA from susp Covid and community exposures.  Was in hypoxia at admission with thrombocytopenia, all from Covid.  Has L hip, knee and sacral pain with no acute fractures post fall.  PMHx:  COPD, neuropathy, hypothyroidism, rheumatoid arthritis on methotrexate, osteoporosis, hypertension, essential tremor, glaucoma, BPPV.    PT Comments    Pt agreeable to working with therapy. She remains unsteady when mobilizing, even with use of RW-pt feels walker is to blame for her unsteadiness. Walked with and without RW this session. Pt tolerated activity fairly well.    Recommendations for follow up therapy are one component of a multi-disciplinary discharge planning process, led by the attending physician.  Recommendations may be updated based on patient status, additional functional criteria and insurance authorization.  Follow Up Recommendations       Assistance Recommended at Discharge Frequent or constant Supervision/Assistance  Patient can return home with the following A little help with bathing/dressing/bathroom;Assistance with cooking/housework;Assist for transportation   Equipment Recommendations  Rolling walker (2 wheels)    Recommendations for Other Services       Precautions / Restrictions Precautions Precautions: Fall Restrictions Weight Bearing Restrictions: No     Mobility  Bed Mobility Overal bed mobility: Modified Independent Bed Mobility: Supine to Sit, Sit to Supine     Supine to sit: Modified independent (Device/Increase time) Sit to supine: Modified independent (Device/Increase time)   General bed mobility comments:  Increased time.    Transfers Overall transfer level: Needs assistance Equipment used: Rolling walker (2 wheels) Transfers: Sit to/from Stand Sit to Stand: Min guard           General transfer comment: Min guard A. Cues for safety, hand placement    Ambulation/Gait Ambulation/Gait assistance: Min assist Gait Distance (Feet): 250 Feet Assistive device: Rolling walker (2 wheels) Gait Pattern/deviations: Step-through pattern, Decreased stride length       General Gait Details: Walked ~75 feet without a device. Walked ~150 feet with a RW. Pt is unsteady-worse without RW (pt feels/states she walks better without one).   Stairs             Wheelchair Mobility    Modified Rankin (Stroke Patients Only)       Balance Overall balance assessment: Needs assistance         Standing balance support: During functional activity Standing balance-Leahy Scale: Fair Standing balance comment: walker is beneficial for dynamic balance                            Cognition Arousal/Alertness: Awake/alert Behavior During Therapy: WFL for tasks assessed/performed Overall Cognitive Status: Within Functional Limits for tasks assessed                                          Exercises      General Comments        Pertinent Vitals/Pain Pain Assessment Pain Assessment: Faces Faces Pain Scale: Hurts a little bit Pain Location: hips, low back with ambulation Pain Descriptors /  Indicators: Discomfort, Guarding Pain Intervention(s): Monitored during session, Repositioned    Home Living                          Prior Function            PT Goals (current goals can now be found in the care plan section) Progress towards PT goals: Progressing toward goals    Frequency    Min 3X/week      PT Plan Current plan remains appropriate    Co-evaluation              AM-PAC PT "6 Clicks" Mobility   Outcome Measure  Help  needed turning from your back to your side while in a flat bed without using bedrails?: None Help needed moving from lying on your back to sitting on the side of a flat bed without using bedrails?: None Help needed moving to and from a bed to a chair (including a wheelchair)?: A Little Help needed standing up from a chair using your arms (e.g., wheelchair or bedside chair)?: A Little Help needed to walk in hospital room?: A Little Help needed climbing 3-5 steps with a railing? : A Little 6 Click Score: 20    End of Session   Activity Tolerance: Patient tolerated treatment well Patient left: in bed;with call bell/phone within reach;with bed alarm set   PT Visit Diagnosis: Unsteadiness on feet (R26.81);Muscle weakness (generalized) (M62.81);Difficulty in walking, not elsewhere classified (R26.2)     Time: 0258-5277 PT Time Calculation (min) (ACUTE ONLY): 19 min  Charges:  $Gait Training: 8-22 mins                         Faye Ramsay, PT Acute Rehabilitation  Office: [phone]

## 2022-09-12 DIAGNOSIS — U071 COVID-19: Secondary | ICD-10-CM | POA: Diagnosis not present

## 2022-09-12 DIAGNOSIS — J189 Pneumonia, unspecified organism: Secondary | ICD-10-CM | POA: Diagnosis not present

## 2022-09-12 LAB — BASIC METABOLIC PANEL
Anion gap: 7 (ref 5–15)
BUN: 18 mg/dL (ref 8–23)
CO2: 29 mmol/L (ref 22–32)
Calcium: 8.9 mg/dL (ref 8.9–10.3)
Chloride: 101 mmol/L (ref 98–111)
Creatinine, Ser: 0.76 mg/dL (ref 0.44–1.00)
GFR, Estimated: >60 mL/min (ref >60)
Glucose, Bld: 69 mg/dL — ABNORMAL LOW (ref 70–99)
Potassium: 4.3 mmol/L (ref 3.5–5.1)
Sodium: 137 mmol/L (ref 135–145)

## 2022-09-12 LAB — MAGNESIUM: Magnesium: 2.2 mg/dL (ref 1.7–2.4)

## 2022-09-12 LAB — C-REACTIVE PROTEIN: CRP: 2.7 mg/dL — ABNORMAL HIGH (ref <1.0)

## 2022-09-12 MED ORDER — PREDNISONE 20 MG PO TABS: 20.0000 mg | ORAL_TABLET | Freq: Every day | ORAL | 0 refills | Status: AC

## 2022-09-12 MED ORDER — GUAIFENESIN-DM 100-10 MG/5ML PO SYRP: 10.0000 mL | ORAL_SOLUTION | ORAL | 0 refills | Status: DC | PRN

## 2022-09-12 MED ORDER — VENTOLIN HFA 108 (90 BASE) MCG/ACT IN AERS: 1.0000 | INHALATION_SPRAY | RESPIRATORY_TRACT | 0 refills | Status: AC | PRN

## 2022-09-12 MED ORDER — METHOTREXATE 2.5 MG PO TABS: 10.0000 mg | ORAL_TABLET | ORAL | Status: AC

## 2022-09-12 MED ORDER — TRAMADOL HCL 50 MG PO TABS: 50.0000 mg | ORAL_TABLET | Freq: Four times a day (QID) | ORAL | 0 refills | Status: DC | PRN

## 2022-09-12 MED ORDER — METHOCARBAMOL 500 MG PO TABS: 500.0000 mg | ORAL_TABLET | Freq: Four times a day (QID) | ORAL | 0 refills | Status: DC | PRN

## 2022-09-12 NOTE — Plan of Care (Signed)
  Problem: Education: Goal: Knowledge of General Education information will improve Description: Including pain rating scale, medication(s)/side effects and non-pharmacologic comfort measures Outcome: Adequate for Discharge   Problem: Health Behavior/Discharge Planning: Goal: Ability to manage health-related needs will improve Outcome: Progressing   Problem: Clinical Measurements: Goal: Ability to maintain clinical measurements within normal limits will improve Outcome: Progressing Goal: Will remain free from infection Outcome: Progressing Goal: Diagnostic test results will improve Outcome: Progressing Goal: Respiratory complications will improve Outcome: Progressing Goal: Cardiovascular complication will be avoided Outcome: Completed/Met   Problem: Activity: Goal: Risk for activity intolerance will decrease Outcome: Adequate for Discharge   Problem: Coping: Goal: Level of anxiety will decrease Outcome: Progressing   Problem: Elimination: Goal: Will not experience complications related to bowel motility Outcome: Completed/Met   Problem: Pain Managment: Goal: General experience of comfort will improve Outcome: Progressing

## 2022-09-12 NOTE — TOC Transition Note (Signed)
Transition of Care [facility]) - CM/SW Discharge Note   Patient Details  Name: Kayla Hurley MRN: [id number] Date of Birth: 1935/01/30  Transition of Care [facility]) CM/SW Contact:  Amada Jupiter, LCSW Phone Number: 09/12/2022, 4:03 PM   Clinical Narrative:    Pt medically cleared for dc home today.  [facility] to resume HHPT/OT services and pt has needed DME.  No TOC needs.   Final next level of care: Home w Home Health Services Barriers to Discharge: No Barriers Identified   Patient Goals and CMS Choice      Discharge Placement                         Discharge Plan and Services Additional resources added to the After Visit Summary for                                       Social Determinants of Health (SDOH) Interventions SDOH Screenings   Food Insecurity: No Food Insecurity (09/09/2022)  Housing: Low Risk  (09/09/2022)  Transportation Needs: No Transportation Needs (09/09/2022)  Utilities: Not At Risk (09/09/2022)  Tobacco Use: Medium Risk (09/08/2022)     Readmission Risk Interventions     No data to display

## 2022-09-12 NOTE — Progress Notes (Signed)
Patient discharged to home w/ family. Given all belongings, instructions, home medications. Verbalized understanding of instructions. Escorted to pov via w/c.

## 2022-09-12 NOTE — Discharge Summary (Signed)
Physician Discharge Summary  Makenze Bramante GGE:[id number] DOB: December 05, 1934 DOA: 09/08/2022  PCP: Oneita Hurt, No  Admit date: 09/08/2022 Discharge date: 09/12/2022  Admitted From: Home Disposition: Home  Recommendations for Outpatient Follow-up:  Follow up with PCP in 1 week with repeat CBC/BMP Completed total 10-day course of isolation for COVID-19 pneumonia Follow up in ED if symptoms worsen or new appear   Home Health: Home health PT/OT Equipment/Devices: None  Discharge Condition: Stable CODE STATUS: Full Diet recommendation: Heart healthy  Brief/Interim Summary: 88 y.o. female with medical history significant of rheumatoid arthritis, COPD, hypertensive retinopathy and macular degeneration, recent admission from 09/06/2022-09/07/2022 for COVID-19 pneumonia treated with 2 doses of Decadron presented with multiple episodes of fall along with worsening weakness and heterogeneous airspace disease in the left mid and lower lung zone, suspicious for pneumonia. On presentation, potassium was 2.7; chest x-ray showed slight worsening intrusiveness of airspace disease in the left mid and lower lung zone, suspicious for pneumonia.  CT of the head was negative for acute intracranial abnormity.  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  She was started on IV antibiotics and steroids.  During the hospitalization, her condition has improved.  She has remained on room air.  PT/OT recommended home health PT/OT.  She feels much better and wants to go home today.  She will be discharged home today on oral prednisone.  Discharge Diagnoses:   COVID-19 pneumonia Possible secondary community-acquired bacterial pneumonia -Tested positive for COVID-19 on 09/06/2022 and was admitted from 09/06/2022-09/07/2022 and treated with 2 doses of Decadron.  Presented with worsening weakness and shortness of breath.  Imaging as above. -Currently on IV steroids and antibiotics.  On room  air. -Continue monitoring inflammatory markers.  Procalcitonin less than 0.1 on 09/11/2022.  COVID-19 Labs  Recent Labs    09/10/22 0357 09/11/22 0335  CRP 6.8* 3.9*    Lab Results  Component Value Date   SARSCOV2NAA POSITIVE (A) 09/06/2022  -Continue isolation to complete 10-day course. -Respiratory status is stable with patient feels better enough to go home today.  Discharge patient home today on oral prednisone 40 mg daily for 7 more days.  No need for any more antibiotics.   Sepsis has been ruled out   Hyponatremia -Possibly from dehydration.  Improved with IV fluids. Off IV fluids.  Outpatient follow up.  Hypokalemia -Improved   Thrombocytopenia -Questionable cause.  No signs of bleeding.  Monitor intermittently as an outpatient.   Hypothyroidism -Continue Synthroid   Rheumatoid arthritis -Hold methotrexate till next Thursday.  Outpatient follow-up with PCP.   Falls at home Gait instability -PT/OT following and recommending home health PT/OT.   Hypertension -Blood pressure has much improved.  Resume hydrochlorothiazide on discharge.   Goals of care -Patient is currently listed as full code.  Palliative care evaluation appreciated.  Patient remains full code.  Discharge Instructions  Discharge Instructions     Diet - low sodium heart healthy   Complete by: As directed    Increase activity slowly   Complete by: As directed    No wound care   Complete by: As directed       Allergies as of 09/12/2022       Reactions   Adhesive [tape] Other (See Comments)   Blisters   Cucumber Extract Other (See Comments)   Exact allergic reaction not cited   Lactose Intolerance (gi) Diarrhea   Penicillins Diarrhea, Nausea And Vomiting, Rash  Medication List     STOP taking these medications    triamterene-hydrochlorothiazide 75-50 MG tablet Commonly known as: MAXZIDE       TAKE these medications    acetaminophen 325 MG tablet Commonly known as:  TYLENOL Take 2 tablets (650 mg total) by mouth every 6 (six) hours as needed for mild pain (or Fever >/= 101).   bisacodyl 5 MG EC tablet Commonly known as: DULCOLAX Take 5 mg by mouth daily as needed for moderate constipation.   CAL-MAG-ZINC PO Take 1-2 tablets by mouth daily.   clobetasol 0.05 % external solution Commonly known as: TEMOVATE Apply 1 Application topically 2 (two) times daily as needed (as directed- to affected area(s); avoid face/groin/underarms).   diclofenac Sodium 1 % Gel Commonly known as: VOLTAREN Apply 2 g topically daily as needed (affected sites- for pain).   fluticasone 0.05 % cream Commonly known as: CUTIVATE Apply 1 Application topically 2 (two) times daily as needed (as directed- to affected area(s)).   folic acid 1 MG tablet Commonly known as: FOLVITE Take 2 mg by mouth daily.   gabapentin 100 MG capsule Commonly known as: Neurontin Take 1 capsule (100 mg total) by mouth at bedtime.   Gaviscon Extra Strength 160-105 MG Chew Generic drug: Alum Hydroxide-Mag Carbonate Chew 1 tablet by mouth every 6 (six) hours as needed (for gas).   guaiFENesin-dextromethorphan 100-10 MG/5ML syrup Commonly known as: ROBITUSSIN DM Take 10 mLs by mouth every 4 (four) hours as needed for cough.   hydrochlorothiazide 25 MG tablet Commonly known as: HYDRODIURIL Take 25 mg by mouth daily.   ketoconazole 2 % cream Commonly known as: NIZORAL Apply 1 Application topically daily as needed for irritation.   leucovorin 5 MG tablet Commonly known as: WELLCOVORIN Take 10 mg by mouth every Friday.   levothyroxine 50 MCG tablet Commonly known as: SYNTHROID Take 50 mcg by mouth daily before breakfast.   methocarbamol 500 MG tablet Commonly known as: ROBAXIN Take 1 tablet (500 mg total) by mouth every 6 (six) hours as needed for muscle spasms.   methotrexate 2.5 MG tablet Commonly known as: RHEUMATREX Take 4 tablets (10 mg total) by mouth every Thursday. Resume  from next week Start taking on: September 22, 2022 What changed:  additional instructions These instructions start on September 22, 2022. If you are unsure what to do until then, ask your doctor or other care provider.   NON FORMULARY Place 1-2 tablets under the tongue See admin instructions. Hyland's leg cramp tablets- Dissolve 1-2 tablets under the tongue every four hours as needed for leg cramps   potassium chloride 10 MEQ tablet Commonly known as: KLOR-CON Take 10 mEq by mouth in the morning and at bedtime.   predniSONE 20 MG tablet Commonly known as: DELTASONE Take 1 tablet (20 mg total) by mouth daily with breakfast for 7 days. Start taking on: September 13, 2022   PreserVision AREDS 2 Caps Take 1 capsule by mouth in the morning and at bedtime.   primidone 50 MG tablet Commonly known as: MYSOLINE TAKE 1 TABLET BY MOUTH AT BEDTIME   salsalate 750 MG tablet Commonly known as: DISALCID Take 1,500 mg by mouth 2 (two) times daily.   simethicone 125 MG chewable tablet Commonly known as: MYLICON Chew 125 mg by mouth every 6 (six) hours as needed for flatulence.   timolol 0.5 % ophthalmic solution Commonly known as: TIMOPTIC Place 1 drop into both eyes 2 (two) times daily.   traMADol 50 MG tablet  Commonly known as: ULTRAM Take 1 tablet (50 mg total) by mouth every 6 (six) hours as needed for moderate pain.   Ventolin HFA 108 (90 Base) MCG/ACT inhaler Generic drug: albuterol Inhale 1 puff into the lungs every 4 (four) hours as needed for shortness of breath.   Vitamin D3 125 MCG (5000 UT) capsule Generic drug: Cholecalciferol Take 5,000 Units by mouth daily.        Follow-up Information     PCP. Schedule an appointment as soon as possible for a visit in 1 week(s).                 Allergies  Allergen Reactions   Adhesive [Tape] Other (See Comments)    Blisters    Cucumber Extract Other (See Comments)    Exact allergic reaction not cited   Lactose Intolerance (Gi)  Diarrhea   Penicillins Diarrhea, Nausea And Vomiting and Rash    Consultations: Palliative care   Procedures/Studies: CT Head Wo Contrast  Result Date: 09/08/2022 CLINICAL DATA:  Frequent falls including multiple falls today in every day for the last week. Head trauma, moderate-severe EXAM: CT HEAD WITHOUT CONTRAST TECHNIQUE: Contiguous axial images were obtained from the base of the skull through the vertex without intravenous contrast. RADIATION DOSE REDUCTION: This exam was performed according to the departmental dose-optimization program which includes automated exposure control, adjustment of the mA and/or kV according to patient size and/or use of iterative reconstruction technique. COMPARISON:  CT head 09/06/2022 FINDINGS: Brain: No intracranial hemorrhage, mass effect, or evidence of acute infarct. No hydrocephalus. No extra-axial fluid collection. Generalized cerebral atrophy. Ill-defined hypoattenuation within the cerebral white matter is nonspecific but consistent with chronic small vessel ischemic disease. Moderate-sized area of encephalomalacia in the right vertex and evidence of prior craniotomy. History of meningioma resection. Vascular: No hyperdense vessel. Intracranial arterial calcification. Skull: No fracture or focal lesion. Remote craniotomy at the vertex. Sinuses/Orbits: No acute finding. Mucosal thickening in the ethmoid air cells and left-greater-than-right maxillary sinuses. Air-fluid level in the left maxillary sinus. Other: None. IMPRESSION: 1. No acute intracranial abnormality. 2. Paranasal sinus disease with air-fluid level in the left maxillary sinus. Correlate for acute sinusitis. Electronically Signed   By: Minerva Fester M.D.   On: 09/08/2022 23:05   DG Chest 2 View  Result Date: 09/08/2022 CLINICAL DATA:  Frequent falls. Evaluate for pneumonia. EXAM: CHEST - 2 VIEW COMPARISON:  Chest radiograph 09/06/2022 FINDINGS: Slight worsening heterogeneous airspace disease in  the left mid lower lung zone, although partially obscured on the frontal view due to overlying monitoring devices. The heart is normal in size with stable mediastinal contours. No pneumothorax or significant pleural effusion. No pulmonary edema. No acute osseous abnormalities are seen. No visualized rib fractures. IMPRESSION: Slight worsening heterogeneous airspace disease in the left mid and lower lung zone, suspicious for pneumonia. Electronically Signed   By: Narda Rutherford M.D.   On: 09/08/2022 22:36   DG Hips Bilat W or Wo Pelvis 3-4 Views  Result Date: 09/08/2022 CLINICAL DATA:  Frequent falls. Multiple falls today. Acute on chronic left hip pain. EXAM: DG HIP (WITH OR WITHOUT PELVIS) 3-4V BILAT COMPARISON:  Pelvis and left hip radiographs yesterday. FINDINGS: No acute fracture of the pelvis or hips. Moderate left and mild to moderate right hip osteoarthritis with joint space narrowing and spurring. The pubic rami are intact. No pubic symphysis or sacroiliac joint diastasis. No focal bone abnormalities. IMPRESSION: 1. No fracture of the pelvis or hips. 2. Hip osteoarthritis  is unchanged from yesterday's exam. Electronically Signed   By: Narda Rutherford M.D.   On: 09/08/2022 22:33   DG HIP UNILAT WITH PELVIS 2-3 VIEWS LEFT  Result Date: 09/07/2022 CLINICAL DATA:  Hip pain. EXAM: DG HIP (WITH OR WITHOUT PELVIS) 2-3V LEFT COMPARISON:  AP pelvis 09/06/2022 FINDINGS: Frontal view of the pelvis and frontal and lateral views of the left hip. The bilateral sacroiliac joint spaces are maintained. Mild pubic symphysis joint space narrowing. Moderate bilateral superomedial femoroacetabular joint space narrowing. Mild left femoral head-neck junction circumferential degenerative spurs. Moderate left and mild right superolateral acetabular degenerative osteophytes. Small well corticated chronic ossicle lateral to the superior left acetabulum. No acute fracture or dislocation. Mild-to-moderate atherosclerotic  calcifications. 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 Electronically Signed   By: Neita Garnet M.D.   On: 09/07/2022 12:34   CT Lumbar Spine Wo Contrast  Result Date: 09/06/2022 CLINICAL DATA:  Larey Seat.  Back pain. EXAM: CT LUMBAR SPINE WITHOUT CONTRAST TECHNIQUE: Multidetector CT imaging of the lumbar spine was performed without intravenous contrast administration. Multiplanar CT image reconstructions were also generated. RADIATION DOSE REDUCTION: This exam was performed according to the departmental dose-optimization program which includes automated exposure control, adjustment of the mA and/or kV according to patient size and/or use of iterative reconstruction technique. COMPARISON:  None Available. FINDINGS: Segmentation: There are five lumbar type vertebral bodies. The last full intervertebral disc space is labeled L5-S1. Alignment: Normal Vertebrae: Age related osteoporosis but no acute lumbar spine fracture. No bone lesions. Paraspinal and other soft tissues: No significant paraspinal retroperitoneal findings. There is tortuosity and advanced calcification of the abdominal aorta and iliac arteries but no aneurysm. No retroperitoneal adenopathy. The visualized kidneys are grossly normal. No retroperitoneal hematoma. Disc levels: L1-2: Bulging annulus and mild facet disease contributing to mild spinal and bilateral lateral recess stenosis. L2-3: Diffuse bulging annulus, moderate facet disease and ligamentum flavum thickening contributing to he moderately severe spinal and bilateral lateral recess stenosis. No significant foraminal stenosis. L3-4: Diffuse bulging degenerated annulus, advanced facet disease and ligamentum flavum thickening contributing to severe spinal and bilateral lateral recess stenosis. No significant foraminal stenosis. L4-5: Bulging annulus, advanced facet disease and ligamentum flavum thickening contributing to moderate spinal and bilateral lateral  recess stenosis. L5-S1: Bulging annulus and advanced facet disease without significant spinal or foraminal stenosis. IMPRESSION: 1. Normal alignment and no acute lumbar spine fracture. 2. Age related osteoporosis but no bone lesions. 3. Moderately severe spinal and bilateral lateral recess stenosis at L2-3 and L3-4. 4. Moderate spinal and bilateral lateral recess stenosis at L4-5. 5. Aortic atherosclerosis. Aortic Atherosclerosis (ICD10-I70.0). Electronically Signed   By: Rudie Meyer M.D.   On: 09/06/2022 18:36   CT Head Wo Contrast  Result Date: 09/06/2022 CLINICAL DATA:  Larey Seat.  Hit head. EXAM: CT HEAD WITHOUT CONTRAST CT CERVICAL SPINE WITHOUT CONTRAST TECHNIQUE: Multidetector CT imaging of the head and cervical spine was performed following the standard protocol without intravenous contrast. Multiplanar CT image reconstructions of the cervical spine were also generated. RADIATION DOSE REDUCTION: This exam was performed according to the departmental dose-optimization program which includes automated exposure control, adjustment of the mA and/or kV according to patient size and/or use of iterative reconstruction technique. COMPARISON:  None Available. FINDINGS: CT HEAD FINDINGS Brain: Age related cerebral atrophy, ventriculomegaly and periventricular white matter disease. Moderate-sized area encephalomalacia at the right vertex and evidence of prior craniotomy. History of meningioma resection. No acute intracranial findings or mass lesions. No extra-axial fluid collections  are identified. The brainstem cerebellum grossly. Scattered benign basal ganglia calcifications. Vascular: Moderate vascular calcifications but no aneurysm or hyperdense vessels. Skull: Remote craniotomy at the vertex.  No acute skull fracture. Sinuses/Orbits: Sign scattered ethmoid and maxillary sinus disease. The mastoid air cells and middle ear cavities are clear. The globes are intact. Other: No scalp lesions or scalp hematoma. CT  CERVICAL SPINE FINDINGS Alignment: Normal Skull base and vertebrae: No acute fracture. No primary bone lesion or focal pathologic process. Soft tissues and spinal canal: No prevertebral fluid or swelling. No visible canal hematoma. Disc levels: The spinal canal is fairly generous. No large disc protrusions or canal stenosis. Mild multilevel bony foraminal stenosis due to uncinate spurring and facet disease. Upper chest: The lung apices are grossly clear. Other: Bilateral carotid artery calcifications. IMPRESSION: 1. Age related cerebral atrophy, ventriculomegaly and periventricular white matter disease. 2. Moderate-sized area of encephalomalacia at the right vertex and evidence of prior craniotomy. History of meningioma resection. 3. No acute intracranial findings or skull fracture. 4. Normal alignment of the cervical spine without acute fracture. Electronically Signed   By: Rudie Meyer M.D.   On: 09/06/2022 18:31   CT Cervical Spine Wo Contrast  Result Date: 09/06/2022 CLINICAL DATA:  Larey Seat.  Hit head. EXAM: CT HEAD WITHOUT CONTRAST CT CERVICAL SPINE WITHOUT CONTRAST TECHNIQUE: Multidetector CT imaging of the head and cervical spine was performed following the standard protocol without intravenous contrast. Multiplanar CT image reconstructions of the cervical spine were also generated. RADIATION DOSE REDUCTION: This exam was performed according to the departmental dose-optimization program which includes automated exposure control, adjustment of the mA and/or kV according to patient size and/or use of iterative reconstruction technique. COMPARISON:  None Available. FINDINGS: CT HEAD FINDINGS Brain: Age related cerebral atrophy, ventriculomegaly and periventricular white matter disease. Moderate-sized area encephalomalacia at the right vertex and evidence of prior craniotomy. History of meningioma resection. No acute intracranial findings or mass lesions. No extra-axial fluid collections are identified. The  brainstem cerebellum grossly. Scattered benign basal ganglia calcifications. Vascular: Moderate vascular calcifications but no aneurysm or hyperdense vessels. Skull: Remote craniotomy at the vertex.  No acute skull fracture. Sinuses/Orbits: Sign scattered ethmoid and maxillary sinus disease. The mastoid air cells and middle ear cavities are clear. The globes are intact. Other: No scalp lesions or scalp hematoma. CT CERVICAL SPINE FINDINGS Alignment: Normal Skull base and vertebrae: No acute fracture. No primary bone lesion or focal pathologic process. Soft tissues and spinal canal: No prevertebral fluid or swelling. No visible canal hematoma. Disc levels: The spinal canal is fairly generous. No large disc protrusions or canal stenosis. Mild multilevel bony foraminal stenosis due to uncinate spurring and facet disease. Upper chest: The lung apices are grossly clear. Other: Bilateral carotid artery calcifications. IMPRESSION: 1. Age related cerebral atrophy, ventriculomegaly and periventricular white matter disease. 2. Moderate-sized area of encephalomalacia at the right vertex and evidence of prior craniotomy. History of meningioma resection. 3. No acute intracranial findings or skull fracture. 4. Normal alignment of the cervical spine without acute fracture. Electronically Signed   By: Rudie Meyer M.D.   On: 09/06/2022 18:31   DG Pelvis 1-2 Views  Result Date: 09/06/2022 CLINICAL DATA:  Pain status post fall EXAM: PELVIS - 1 VIEW COMPARISON:  None Available. FINDINGS: Bilateral hip degenerative changes with joint space narrowing and osteophytes. Lumbosacral degenerative changes. No acute fracture, dislocation or subluxation. IMPRESSION: Bilateral hip degenerative changes. No acute osseous abnormalities identified. Electronically Signed   By: Layla Maw  M.D.   On: 09/06/2022 17:45   DG Chest Port 1 View  Result Date: 09/06/2022 CLINICAL DATA:  Possible sepsis EXAM: PORTABLE CHEST 1 VIEW COMPARISON:   06/02/2022 FINDINGS: Right lung is grossly clear. Heterogeneous airspace disease in the left mid to lower lung. Normal cardiac size. Aortic atherosclerosis. IMPRESSION: Heterogeneous airspace disease in the left mid to lower lung, suspicious for pneumonia. Radiographic follow-up to resolution recommended. Electronically Signed   By: Jasmine Pang M.D.   On: 09/06/2022 16:42      Subjective: Patient seen and examined at bedside.  Feels much better and wants to go home today.  No fever, chest pain, worsening shortness of breath reported.  Discharge Exam: Vitals:   09/12/22 0554 09/12/22 0850  BP: (!) 141/61   Pulse: 60   Resp: 17   Temp: 97.9 F (36.6 C)   SpO2: 95% 98%    General: Pt is alert, awake, not in acute distress.  Elderly female lying in bed.  On room air. Cardiovascular: rate controlled, S1/S2 + Respiratory: bilateral decreased breath sounds at bases with some scattered crackles Abdominal: Soft, NT, ND, bowel sounds + Extremities: Trace lower extremity edema; no cyanosis    The results of significant diagnostics from this hospitalization (including imaging, microbiology, ancillary and laboratory) are listed below for reference.     Microbiology: Recent Results (from the past 240 hour(s))  Resp panel by RT-PCR (RSV, Flu A&B, Covid)     Status: Abnormal   Collection Time: 09/06/22  3:31 PM   Specimen: Nasal Swab  Result Value Ref Range Status   SARS Coronavirus 2 by RT PCR POSITIVE (A) NEGATIVE Final   Influenza A by PCR NEGATIVE NEGATIVE Final   Influenza B by PCR NEGATIVE NEGATIVE Final    Comment: (NOTE) The Xpert Xpress SARS-CoV-2/FLU/RSV plus assay is intended as an aid in the diagnosis of influenza from Nasopharyngeal swab specimens and should not be used as a sole basis for treatment. Nasal washings and aspirates are unacceptable for Xpert Xpress SARS-CoV-2/FLU/RSV testing.  Fact Sheet for Patients: BloggerCourse.com  Fact Sheet  for Healthcare Providers: SeriousBroker.it  This test is not yet approved or cleared by the [place] FDA and has been authorized for detection and/or diagnosis of SARS-CoV-2 by FDA under an Emergency Use Authorization (EUA). This EUA will remain in effect (meaning this test can be used) for the duration of the COVID-19 declaration under Section 564(b)(1) of the Act, 21 U.S.C. section 360bbb-3(b)(1), unless the authorization is terminated or revoked.     Resp Syncytial Virus by PCR NEGATIVE NEGATIVE Final    Comment: (NOTE) Fact Sheet for Patients: BloggerCourse.com  Fact Sheet for Healthcare Providers: SeriousBroker.it  This test is not yet approved or cleared by the [place] FDA and has been authorized for detection and/or diagnosis of SARS-CoV-2 by FDA under an Emergency Use Authorization (EUA). This EUA will remain in effect (meaning this test can be used) for the duration of the COVID-19 declaration under Section 564(b)(1) of the Act, 21 U.S.C. section 360bbb-3(b)(1), unless the authorization is terminated or revoked.  Performed at [facility], [address], [place], [place] [address]   Blood Culture (routine x 2)     Status: None   Collection Time: 09/06/22  3:39 PM   Specimen: BLOOD RIGHT FOREARM  Result Value Ref Range Status   Specimen Description BLOOD RIGHT FOREARM  Final   Special Requests   Final    BOTTLES DRAWN AEROBIC AND ANAEROBIC Blood Culture  results may not be optimal due to an inadequate volume of blood received in culture bottles   Culture   Final    NO GROWTH 5 DAYS Performed at [facility], [address] [place], [place] [address]    Report Status 09/11/2022 FINAL  Final  Blood Culture (routine x 2)     Status: None   Collection Time: 09/06/22  4:05 PM   Specimen: BLOOD LEFT HAND  Result Value Ref Range Status   Specimen Description BLOOD LEFT  HAND  Final   Special Requests   Final    BOTTLES DRAWN AEROBIC AND ANAEROBIC Blood Culture results may not be optimal due to an inadequate volume of blood received in culture bottles   Culture   Final    NO GROWTH 5 DAYS Performed at [facility], [address], [place], [place] [address]    Report Status 09/11/2022 FINAL  Final     Labs: BNP (last 3 results) No results for input(s): "BNP" in the last 8760 hours. Basic Metabolic Panel: Recent Labs  Lab 09/08/22 2249 09/09/22 0323 09/10/22 0357 09/11/22 0335 09/12/22 0317  NA 133* 137 136 136 137  K 2.7* 3.5 4.1 4.6 4.3  CL 97* 101 100 102 101  CO2 GLUCOSE 112* 103* 99 100* 69*  BUN CREATININE 0.73 0.67 0.58 0.67 0.76  CALCIUM 8.8* 8.6* 8.6* 8.8* 8.9  MG  --   --  1.9 2.1 2.2   Liver Function Tests: Recent Labs  Lab 09/06/22 1531 09/07/22 1234 09/09/22 0323 09/10/22 0357 09/11/22 0335  AST 45* 39 43* 40 33  ALT ALKPHOS 37* 34* 29* 26* 26*  BILITOT 0.8 0.6 0.6 0.5 0.6  PROT 6.7 6.1* 5.5* 5.3* 5.5*  ALBUMIN 3.3* 2.9* 2.8* 2.7* 2.6*   No results for input(s): "LIPASE", "AMYLASE" in the last 168 hours. No results for input(s): "AMMONIA" in the last 168 hours. CBC: Recent Labs  Lab 09/06/22 1531 09/06/22 1915 09/07/22 1234 09/08/22 2249 09/09/22 0323 09/10/22 0357 09/11/22 0707  WBC 4.7   < > 3.1* 6.3 5.5 5.8 5.8  NEUTROABS 3.1  --  2.1 3.6  --  2.9 3.5  HGB 12.7   < > 12.9 11.8* 11.5* 11.8* 12.2  HCT 38.8   < > 38.5 34.5* 33.4* 35.6* 36.7  MCV 96.5   < > 94.8 92.2 94.4 93.9 94.3  PLT 66*   < > 71* 82* 75* 81* 117*   < > = values in this interval not displayed.   Cardiac Enzymes: No results for input(s): "CKTOTAL", "CKMB", "CKMBINDEX", "TROPONINI" in the last 168 hours. BNP: Invalid input(s): "POCBNP" CBG: No results for input(s): "GLUCAP" in the last 168 hours. D-Dimer No results for input(s): "DDIMER" in the last 72 hours. Hgb A1c No results  for input(s): "HGBA1C" in the last 72 hours. Lipid Profile No results for input(s): "CHOL", "HDL", "LDLCALC", "TRIG", "CHOLHDL", "LDLDIRECT" in the last 72 hours. Thyroid function studies No results for input(s): "TSH", "T4TOTAL", "T3FREE", "THYROIDAB" in the last 72 hours.  Invalid input(s): "FREET3" Anemia work up No results for input(s): "VITAMINB12", "FOLATE", "FERRITIN", "TIBC", "IRON", "RETICCTPCT" in the last 72 hours. Urinalysis    Component Value Date/Time   COLORURINE STRAW (A) 09/08/2022 2203   APPEARANCEUR CLEAR 09/08/2022 2203   LABSPEC 1.011 09/08/2022 2203   PHURINE 5.0 09/08/2022 2203   GLUCOSEU NEGATIVE 09/08/2022 2203  HGBUR MODERATE (A) 09/08/2022 2203   BILIRUBINUR NEGATIVE 09/08/2022 2203   KETONESUR 5 (A) 09/08/2022 2203   PROTEINUR 30 (A) 09/08/2022 2203   NITRITE NEGATIVE 09/08/2022 2203   LEUKOCYTESUR NEGATIVE 09/08/2022 2203   Sepsis Labs Recent Labs  Lab 09/08/22 2249 09/09/22 0323 09/10/22 0357 09/11/22 0707  WBC 6.3 5.5 5.8 5.8   Microbiology Recent Results (from the past 240 hour(s))  Resp panel by RT-PCR (RSV, Flu A&B, Covid)     Status: Abnormal   Collection Time: 09/06/22  3:31 PM   Specimen: Nasal Swab  Result Value Ref Range Status   SARS Coronavirus 2 by RT PCR POSITIVE (A) NEGATIVE Final   Influenza A by PCR NEGATIVE NEGATIVE Final   Influenza B by PCR NEGATIVE NEGATIVE Final    Comment: (NOTE) The Xpert Xpress SARS-CoV-2/FLU/RSV plus assay is intended as an aid in the diagnosis of influenza from Nasopharyngeal swab specimens and should not be used as a sole basis for treatment. Nasal washings and aspirates are unacceptable for Xpert Xpress SARS-CoV-2/FLU/RSV testing.  Fact Sheet for Patients: BloggerCourse.com  Fact Sheet for Healthcare Providers: SeriousBroker.it  This test is not yet approved or cleared by the [place] FDA and has been authorized for detection and/or  diagnosis of SARS-CoV-2 by FDA under an Emergency Use Authorization (EUA). This EUA will remain in effect (meaning this test can be used) for the duration of the COVID-19 declaration under Section 564(b)(1) of the Act, 21 U.S.C. section 360bbb-3(b)(1), unless the authorization is terminated or revoked.     Resp Syncytial Virus by PCR NEGATIVE NEGATIVE Final    Comment: (NOTE) Fact Sheet for Patients: BloggerCourse.com  Fact Sheet for Healthcare Providers: SeriousBroker.it  This test is not yet approved or cleared by the [place] FDA and has been authorized for detection and/or diagnosis of SARS-CoV-2 by FDA under an Emergency Use Authorization (EUA). This EUA will remain in effect (meaning this test can be used) for the duration of the COVID-19 declaration under Section 564(b)(1) of the Act, 21 U.S.C. section 360bbb-3(b)(1), unless the authorization is terminated or revoked.  Performed at [facility], [address] [place], [place] [address]   Blood Culture (routine x 2)     Status: None   Collection Time: 09/06/22  3:39 PM   Specimen: BLOOD RIGHT FOREARM  Result Value Ref Range Status   Specimen Description BLOOD RIGHT FOREARM  Final   Special Requests   Final    BOTTLES DRAWN AEROBIC AND ANAEROBIC Blood Culture results may not be optimal due to an inadequate volume of blood received in culture bottles   Culture   Final    NO GROWTH 5 DAYS Performed at [facility], [address], [place], [place] [address]    Report Status 09/11/2022 FINAL  Final  Blood Culture (routine x 2)     Status: None   Collection Time: 09/06/22  4:05 PM   Specimen: BLOOD LEFT HAND  Result Value Ref Range Status   Specimen Description BLOOD LEFT HAND  Final   Special Requests   Final    BOTTLES DRAWN AEROBIC AND ANAEROBIC Blood Culture results may not be optimal due to an inadequate volume of blood received in culture bottles    Culture   Final    NO GROWTH 5 DAYS Performed at [facility], [address], [place], [place] [address]    Report Status 09/11/2022 FINAL  Final     Time coordinating discharge:  35 minutes  SIGNED:   Glade Lloyd, MD  [facility] 09/12/2022, 9:34 AM

## 2022-09-13 ENCOUNTER — Other Ambulatory Visit: Payer: Self-pay | Admitting: Hematology and Oncology

## 2022-09-13 DIAGNOSIS — D72820 Lymphocytosis (symptomatic): Secondary | ICD-10-CM

## 2022-09-13 NOTE — Progress Notes (Signed)
[facility] [phone]   [phone]  PROGRESS NOTE  Patient Care Team: Pcp, No as PCP - General Tat, Octaviano Batty, DO as Consulting Physician (Neurology)  Hematological/Oncological History # Monoclonal B Cell Lymphocytosis  -04/15/2022: Labs from rheumatologist, Dr. Stefano Gaul Aryal-Hgb 12.9, WBC 4.8, Plt 181, ESR 8, CRP 0.58 (H). Blood smear showed high number of atypical lymphocytes measuring approximately 16%.    -05/10/2022: Establish care with [facility] with Dr. Jeanie Sewer and Georga Kaufmann PA-C. Flow cytometry showed findings consistent with minimal/early  involvement by a B-cell lymphoproliferative process particularly  monoclonal B-cell lymphocytosis     Interval History:  Kayla Hurley 88 y.o. female with medical history significant for monoclonal B-cell lymphocytosis who presents for a follow up visit. The patient's last visit was on 05/10/2022 at which time she established care. In the interim since the last visit her diagnosis was confirmed with flow cytometry showing a B-cell lymphoproliferative process, consistent with monoclonal B-cell lymphocytosis.  On exam today Kayla Hurley is accompanied by her son.  She reports that she was recently in the hospital at which time her methotrexate was held, but unfortunately that caused a flare of her RA.  She reports that she feels like she is recovering well from her hospitalization.  She is getting better each day and her energy is improving though she does have difficulty with sleeping.  She reports her appetite is "okay".  She does that she does not have any bumps or lumps concerning for lymphadenopathy.  She has not been having any trouble with fevers, chills, sweats, nausea, vomiting or diarrhea.  A full 10 point ROS is otherwise negative.  The bulk of our discussion focused on the diagnosis of monoclonal B-cell lymphocytosis and the plan to monitor moving forward.  The patient voiced  understanding.  MEDICAL HISTORY:  Past Medical History:  Diagnosis Date   COPD (chronic obstructive pulmonary disease)    Glaucoma    Hypertensive retinopathy    Macular degeneration    Meningioma    Rheumatoid arthritis     SURGICAL HISTORY: Past Surgical History:  Procedure Laterality Date   APPENDECTOMY     CATARACT EXTRACTION     CESAREAN SECTION     x 3   CHOLECYSTECTOMY     CRANIECTOMY / CRANIOTOMY FOR EXCISION OF BRAIN TUMOR     meningioma   EYE SURGERY     HYSTEROSCOPY WITH D & C     INGUINAL HERNIA REPAIR     YAG LASER APPLICATION      SOCIAL HISTORY: Social History   Socioeconomic History   Marital status: Widowed    Spouse name: Not on file   Number of children: Not on file   Years of education: Not on file   Highest education level: Not on file  Occupational History   Not on file  Tobacco Use   Smoking status: Former    Packs/day: 1.00    Years: 50.00    Additional pack years: 0.00    Total pack years: 50.00    Types: Cigarettes   Smokeless tobacco: Never   Tobacco comments:    Quit smoking 1990's  Vaping Use   Vaping Use: Never used  Substance and Sexual Activity   Alcohol use: Not Currently    Comment: occasional   Drug use: Never   Sexual activity: Not Currently  Other Topics Concern   Not on file  Social History Narrative   Right handed    Social  Determinants of Health   Financial Resource Strain: Not on file  Food Insecurity: No Food Insecurity (09/09/2022)   Hunger Vital Sign    Worried About Running Out of Food in the Last Year: Never true    Ran Out of Food in the Last Year: Never true  Transportation Needs: No Transportation Needs (09/09/2022)   PRAPARE - Administrator, Civil Service (Medical): No    Lack of Transportation (Non-Medical): No  Physical Activity: Not on file  Stress: Not on file  Social Connections: Not on file  Intimate Partner Violence: Not At Risk (09/09/2022)   Humiliation, Afraid, Rape, and Kick  questionnaire    Fear of Current or Ex-Partner: No    Emotionally Abused: No    Physically Abused: No    Sexually Abused: No    FAMILY HISTORY: Family History  Problem Relation Age of Onset   Other Mother        died during open heart surgery   Heart disease Father    Lung cancer Maternal Grandfather        smoker   Diabetes Son     ALLERGIES:  is allergic to adhesive [tape], cucumber extract, lactose intolerance (gi), and penicillins.  MEDICATIONS:  Current Outpatient Medications  Medication Sig Dispense Refill   acetaminophen (TYLENOL) 325 MG tablet Take 2 tablets (650 mg total) by mouth every 6 (six) hours as needed for mild pain (or Fever >/= 101).     bisacodyl (DULCOLAX) 5 MG EC tablet Take 5 mg by mouth daily as needed for moderate constipation.     Calcium-Magnesium-Zinc (CAL-MAG-ZINC PO) Take 1-2 tablets by mouth daily.     Cholecalciferol (VITAMIN D3) 125 MCG (5000 UT) capsule Take 5,000 Units by mouth daily.     clobetasol (TEMOVATE) 0.05 % external solution Apply 1 Application topically 2 (two) times daily as needed (as directed- to affected area(s); avoid face/groin/underarms).     diclofenac Sodium (VOLTAREN) 1 % GEL Apply 2 g topically daily as needed (affected sites- for pain).     fluticasone (CUTIVATE) 0.05 % cream Apply 1 Application topically 2 (two) times daily as needed (as directed- to affected area(s)).     folic acid (FOLVITE) 1 MG tablet Take 2 mg by mouth daily.     gabapentin (NEURONTIN) 100 MG capsule Take 1 capsule (100 mg total) by mouth at bedtime. (Patient not taking: Reported on 09/10/2022) 90 capsule 1   GAVISCON EXTRA STRENGTH 160-105 MG CHEW Chew 1 tablet by mouth every 6 (six) hours as needed (for gas).     guaiFENesin-dextromethorphan (ROBITUSSIN DM) 100-10 MG/5ML syrup Take 10 mLs by mouth every 4 (four) hours as needed for cough. 236 mL 0   hydrochlorothiazide (HYDRODIURIL) 25 MG tablet Take 25 mg by mouth daily.     ketoconazole (NIZORAL) 2 %  cream Apply 1 Application topically daily as needed for irritation.     leucovorin (WELLCOVORIN) 5 MG tablet Take 10 mg by mouth every Friday.     levothyroxine (SYNTHROID) 50 MCG tablet Take 50 mcg by mouth daily before breakfast.     methocarbamol (ROBAXIN) 500 MG tablet Take 1 tablet (500 mg total) by mouth every 6 (six) hours as needed for muscle spasms. 30 tablet 0   [START ON 09/22/2022] methotrexate (RHEUMATREX) 2.5 MG tablet Take 4 tablets (10 mg total) by mouth every Thursday. Resume from next week     Multiple Vitamins-Minerals (PRESERVISION AREDS 2) CAPS Take 1 capsule by mouth in the  morning and at bedtime.     NON FORMULARY Place 1-2 tablets under the tongue See admin instructions. Hyland's leg cramp tablets- Dissolve 1-2 tablets under the tongue every four hours as needed for leg cramps     potassium chloride (KLOR-CON) 10 MEQ tablet Take 10 mEq by mouth in the morning and at bedtime.     predniSONE (DELTASONE) 20 MG tablet Take 1 tablet (20 mg total) by mouth daily with breakfast for 7 days. 7 tablet 0   primidone (MYSOLINE) 50 MG tablet TAKE 1 TABLET BY MOUTH AT BEDTIME (Patient taking differently: Take 50 mg by mouth at bedtime.) 90 tablet 0   salsalate (DISALCID) 750 MG tablet Take 1,500 mg by mouth 2 (two) times daily.     simethicone (MYLICON) 125 MG chewable tablet Chew 125 mg by mouth every 6 (six) hours as needed for flatulence.     timolol (TIMOPTIC) 0.5 % ophthalmic solution Place 1 drop into both eyes 2 (two) times daily.     traMADol (ULTRAM) 50 MG tablet Take 1 tablet (50 mg total) by mouth every 6 (six) hours as needed for moderate pain. 14 tablet 0   VENTOLIN HFA 108 (90 Base) MCG/ACT inhaler Inhale 1 puff into the lungs every 4 (four) hours as needed for shortness of breath. 1 each 0   No current facility-administered medications for this visit.    REVIEW OF SYSTEMS:   Constitutional: ( - ) fevers, ( - )  chills , ( - ) night sweats Eyes: ( - ) blurriness of vision,  ( - ) double vision, ( - ) watery eyes Ears, nose, mouth, throat, and face: ( - ) mucositis, ( - ) sore throat Respiratory: ( - ) cough, ( - ) dyspnea, ( - ) wheezes Cardiovascular: ( - ) palpitation, ( - ) chest discomfort, ( - ) lower extremity swelling Gastrointestinal:  ( - ) nausea, ( - ) heartburn, ( - ) change in bowel habits Skin: ( - ) abnormal skin rashes Lymphatics: ( - ) new lymphadenopathy, ( - ) easy bruising Neurological: ( - ) numbness, ( - ) tingling, ( - ) new weaknesses Behavioral/Psych: ( - ) mood change, ( - ) new changes  All other systems were reviewed with the patient and are negative.  PHYSICAL EXAMINATION:  Vitals:   09/14/22 1049  BP: (!) 151/65  Pulse: 94  Resp: 16  Temp: 97.7 F (36.5 C)  SpO2: 94%   Filed Weights   09/14/22 1049  Weight: 138 lb 3.2 oz (62.7 kg)    GENERAL: Well-appearing elderly Caucasian female, alert, no distress and comfortable SKIN: skin color, texture, turgor are normal, no rashes or significant lesions EYES: conjunctiva are pink and non-injected, sclera clear LUNGS: clear to auscultation and percussion with normal breathing effort HEART: regular rate & rhythm and no murmurs and no lower extremity edema Musculoskeletal: no cyanosis of digits and no clubbing  PSYCH: alert & oriented x 3, fluent speech NEURO: no focal motor/sensory deficits  LABORATORY DATA:  I have reviewed the data as listed    Latest Ref Rng & Units 09/14/2022    9:55 AM 09/11/2022    7:07 AM 09/10/2022    3:57 AM  CBC  WBC 4.0 - 10.5 K/uL 9.5  5.8  5.8   Hemoglobin 12.0 - 15.0 g/dL 16.1  09.6  04.5   Hematocrit 36.0 - 46.0 % 37.4  36.7  35.6   Platelets 150 - 400 K/uL 196  117  81        Latest Ref Rng & Units 09/14/2022    9:55 AM 09/12/2022    3:17 AM 09/11/2022    3:35 AM  CMP  Glucose 70 - 99 mg/dL 409  69  811   BUN 8 - 23 mg/dL 17  18  19    Creatinine 0.44 - 1.00 mg/dL 9.14  7.82  9.56   Sodium 135 - 145 mmol/L 140  137  136   Potassium 3.5  - 5.1 mmol/L 3.3  4.3  4.6   Chloride 98 - 111 mmol/L 100  101  102   CO2 22 - 32 mmol/L 31  29  28    Calcium 8.9 - 10.3 mg/dL 9.8  8.9  8.8   Total Protein 6.5 - 8.1 g/dL 7.1   5.5   Total Bilirubin 0.3 - 1.2 mg/dL 0.4   0.6   Alkaline Phos 38 - 126 U/L 40   26   AST 15 - 41 U/L 58   33   ALT 0 - 44 U/L 48   21     RADIOGRAPHIC STUDIES: CT Head Wo Contrast  Result Date: 09/08/2022 CLINICAL DATA:  Frequent falls including multiple falls today in every day for the last week. Head trauma, moderate-severe EXAM: CT HEAD WITHOUT CONTRAST TECHNIQUE: Contiguous axial images were obtained from the base of the skull through the vertex without intravenous contrast. RADIATION DOSE REDUCTION: This exam was performed according to the departmental dose-optimization program which includes automated exposure control, adjustment of the mA and/or kV according to patient size and/or use of iterative reconstruction technique. COMPARISON:  CT head 09/06/2022 FINDINGS: Brain: No intracranial hemorrhage, mass effect, or evidence of acute infarct. No hydrocephalus. No extra-axial fluid collection. Generalized cerebral atrophy. Ill-defined hypoattenuation within the cerebral white matter is nonspecific but consistent with chronic small vessel ischemic disease. Moderate-sized area of encephalomalacia in the right vertex and evidence of prior craniotomy. History of meningioma resection. Vascular: No hyperdense vessel. Intracranial arterial calcification. Skull: No fracture or focal lesion. Remote craniotomy at the vertex. Sinuses/Orbits: No acute finding. Mucosal thickening in the ethmoid air cells and left-greater-than-right maxillary sinuses. Air-fluid level in the left maxillary sinus. Other: None. IMPRESSION: 1. No acute intracranial abnormality. 2. Paranasal sinus disease with air-fluid level in the left maxillary sinus. Correlate for acute sinusitis. Electronically Signed   By: Minerva Fester M.D.   On: 09/08/2022 23:05    DG Chest 2 View  Result Date: 09/08/2022 CLINICAL DATA:  Frequent falls. Evaluate for pneumonia. EXAM: CHEST - 2 VIEW COMPARISON:  Chest radiograph 09/06/2022 FINDINGS: Slight worsening heterogeneous airspace disease in the left mid lower lung zone, although partially obscured on the frontal view due to overlying monitoring devices. The heart is normal in size with stable mediastinal contours. No pneumothorax or significant pleural effusion. No pulmonary edema. No acute osseous abnormalities are seen. No visualized rib fractures. IMPRESSION: Slight worsening heterogeneous airspace disease in the left mid and lower lung zone, suspicious for pneumonia. Electronically Signed   By: Narda Rutherford M.D.   On: 09/08/2022 22:36   DG Hips Bilat W or Wo Pelvis 3-4 Views  Result Date: 09/08/2022 CLINICAL DATA:  Frequent falls. Multiple falls today. Acute on chronic left hip pain. EXAM: DG HIP (WITH OR WITHOUT PELVIS) 3-4V BILAT COMPARISON:  Pelvis and left hip radiographs yesterday. FINDINGS: No acute fracture of the pelvis or hips. Moderate left and mild to moderate right hip osteoarthritis with joint space narrowing and spurring. The  pubic rami are intact. No pubic symphysis or sacroiliac joint diastasis. No focal bone abnormalities. IMPRESSION: 1. No fracture of the pelvis or hips. 2. Hip osteoarthritis is unchanged from yesterday's exam. Electronically Signed   By: Narda Rutherford M.D.   On: 09/08/2022 22:33   DG HIP UNILAT WITH PELVIS 2-3 VIEWS LEFT  Result Date: 09/07/2022 CLINICAL DATA:  Hip pain. EXAM: DG HIP (WITH OR WITHOUT PELVIS) 2-3V LEFT COMPARISON:  AP pelvis 09/06/2022 FINDINGS: Frontal view of the pelvis and frontal and lateral views of the left hip. The bilateral sacroiliac joint spaces are maintained. Mild pubic symphysis joint space narrowing. Moderate bilateral superomedial femoroacetabular joint space narrowing. Mild left femoral head-neck junction circumferential degenerative spurs.  Moderate left and mild right superolateral acetabular degenerative osteophytes. Small well corticated chronic ossicle lateral to the superior left acetabulum. No acute fracture or dislocation. Mild-to-moderate atherosclerotic calcifications. 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 Electronically Signed   By: Neita Garnet M.D.   On: 09/07/2022 12:34   CT Lumbar Spine Wo Contrast  Result Date: 09/06/2022 CLINICAL DATA:  Larey Seat.  Back pain. EXAM: CT LUMBAR SPINE WITHOUT CONTRAST TECHNIQUE: Multidetector CT imaging of the lumbar spine was performed without intravenous contrast administration. Multiplanar CT image reconstructions were also generated. RADIATION DOSE REDUCTION: This exam was performed according to the departmental dose-optimization program which includes automated exposure control, adjustment of the mA and/or kV according to patient size and/or use of iterative reconstruction technique. COMPARISON:  None Available. FINDINGS: Segmentation: There are five lumbar type vertebral bodies. The last full intervertebral disc space is labeled L5-S1. Alignment: Normal Vertebrae: Age related osteoporosis but no acute lumbar spine fracture. No bone lesions. Paraspinal and other soft tissues: No significant paraspinal retroperitoneal findings. There is tortuosity and advanced calcification of the abdominal aorta and iliac arteries but no aneurysm. No retroperitoneal adenopathy. The visualized kidneys are grossly normal. No retroperitoneal hematoma. Disc levels: L1-2: Bulging annulus and mild facet disease contributing to mild spinal and bilateral lateral recess stenosis. L2-3: Diffuse bulging annulus, moderate facet disease and ligamentum flavum thickening contributing to he moderately severe spinal and bilateral lateral recess stenosis. No significant foraminal stenosis. L3-4: Diffuse bulging degenerated annulus, advanced facet disease and ligamentum flavum thickening  contributing to severe spinal and bilateral lateral recess stenosis. No significant foraminal stenosis. L4-5: Bulging annulus, advanced facet disease and ligamentum flavum thickening contributing to moderate spinal and bilateral lateral recess stenosis. L5-S1: Bulging annulus and advanced facet disease without significant spinal or foraminal stenosis. IMPRESSION: 1. Normal alignment and no acute lumbar spine fracture. 2. Age related osteoporosis but no bone lesions. 3. Moderately severe spinal and bilateral lateral recess stenosis at L2-3 and L3-4. 4. Moderate spinal and bilateral lateral recess stenosis at L4-5. 5. Aortic atherosclerosis. Aortic Atherosclerosis (ICD10-I70.0). Electronically Signed   By: Rudie Meyer M.D.   On: 09/06/2022 18:36   CT Head Wo Contrast  Result Date: 09/06/2022 CLINICAL DATA:  Larey Seat.  Hit head. EXAM: CT HEAD WITHOUT CONTRAST CT CERVICAL SPINE WITHOUT CONTRAST TECHNIQUE: Multidetector CT imaging of the head and cervical spine was performed following the standard protocol without intravenous contrast. Multiplanar CT image reconstructions of the cervical spine were also generated. RADIATION DOSE REDUCTION: This exam was performed according to the departmental dose-optimization program which includes automated exposure control, adjustment of the mA and/or kV according to patient size and/or use of iterative reconstruction technique. COMPARISON:  None Available. FINDINGS: CT HEAD FINDINGS Brain: Age related cerebral atrophy, ventriculomegaly and periventricular white matter disease.  Moderate-sized area encephalomalacia at the right vertex and evidence of prior craniotomy. History of meningioma resection. No acute intracranial findings or mass lesions. No extra-axial fluid collections are identified. The brainstem cerebellum grossly. Scattered benign basal ganglia calcifications. Vascular: Moderate vascular calcifications but no aneurysm or hyperdense vessels. Skull: Remote craniotomy at  the vertex.  No acute skull fracture. Sinuses/Orbits: Sign scattered ethmoid and maxillary sinus disease. The mastoid air cells and middle ear cavities are clear. The globes are intact. Other: No scalp lesions or scalp hematoma. CT CERVICAL SPINE FINDINGS Alignment: Normal Skull base and vertebrae: No acute fracture. No primary bone lesion or focal pathologic process. Soft tissues and spinal canal: No prevertebral fluid or swelling. No visible canal hematoma. Disc levels: The spinal canal is fairly generous. No large disc protrusions or canal stenosis. Mild multilevel bony foraminal stenosis due to uncinate spurring and facet disease. Upper chest: The lung apices are grossly clear. Other: Bilateral carotid artery calcifications. IMPRESSION: 1. Age related cerebral atrophy, ventriculomegaly and periventricular white matter disease. 2. Moderate-sized area of encephalomalacia at the right vertex and evidence of prior craniotomy. History of meningioma resection. 3. No acute intracranial findings or skull fracture. 4. Normal alignment of the cervical spine without acute fracture. Electronically Signed   By: Rudie MeyerP.  Gallerani M.D.   On: 09/06/2022 18:31   CT Cervical Spine Wo Contrast  Result Date: 09/06/2022 CLINICAL DATA:  Larey SeatFell.  Hit head. EXAM: CT HEAD WITHOUT CONTRAST CT CERVICAL SPINE WITHOUT CONTRAST TECHNIQUE: Multidetector CT imaging of the head and cervical spine was performed following the standard protocol without intravenous contrast. Multiplanar CT image reconstructions of the cervical spine were also generated. RADIATION DOSE REDUCTION: This exam was performed according to the departmental dose-optimization program which includes automated exposure control, adjustment of the mA and/or kV according to patient size and/or use of iterative reconstruction technique. COMPARISON:  None Available. FINDINGS: CT HEAD FINDINGS Brain: Age related cerebral atrophy, ventriculomegaly and periventricular white matter  disease. Moderate-sized area encephalomalacia at the right vertex and evidence of prior craniotomy. History of meningioma resection. No acute intracranial findings or mass lesions. No extra-axial fluid collections are identified. The brainstem cerebellum grossly. Scattered benign basal ganglia calcifications. Vascular: Moderate vascular calcifications but no aneurysm or hyperdense vessels. Skull: Remote craniotomy at the vertex.  No acute skull fracture. Sinuses/Orbits: Sign scattered ethmoid and maxillary sinus disease. The mastoid air cells and middle ear cavities are clear. The globes are intact. Other: No scalp lesions or scalp hematoma. CT CERVICAL SPINE FINDINGS Alignment: Normal Skull base and vertebrae: No acute fracture. No primary bone lesion or focal pathologic process. Soft tissues and spinal canal: No prevertebral fluid or swelling. No visible canal hematoma. Disc levels: The spinal canal is fairly generous. No large disc protrusions or canal stenosis. Mild multilevel bony foraminal stenosis due to uncinate spurring and facet disease. Upper chest: The lung apices are grossly clear. Other: Bilateral carotid artery calcifications. IMPRESSION: 1. Age related cerebral atrophy, ventriculomegaly and periventricular white matter disease. 2. Moderate-sized area of encephalomalacia at the right vertex and evidence of prior craniotomy. History of meningioma resection. 3. No acute intracranial findings or skull fracture. 4. Normal alignment of the cervical spine without acute fracture. Electronically Signed   By: Rudie MeyerP.  Gallerani M.D.   On: 09/06/2022 18:31   DG Pelvis 1-2 Views  Result Date: 09/06/2022 CLINICAL DATA:  Pain status post fall EXAM: PELVIS - 1 VIEW COMPARISON:  None Available. FINDINGS: Bilateral hip degenerative changes with joint space narrowing and osteophytes.  Lumbosacral degenerative changes. No acute fracture, dislocation or subluxation. IMPRESSION: Bilateral hip degenerative changes. No acute  osseous abnormalities identified. Electronically Signed   By: Layla Maw M.D.   On: 09/06/2022 17:45   DG Chest Port 1 View  Result Date: 09/06/2022 CLINICAL DATA:  Possible sepsis EXAM: PORTABLE CHEST 1 VIEW COMPARISON:  06/02/2022 FINDINGS: Right lung is grossly clear. Heterogeneous airspace disease in the left mid to lower lung. Normal cardiac size. Aortic atherosclerosis. IMPRESSION: Heterogeneous airspace disease in the left mid to lower lung, suspicious for pneumonia. Radiographic follow-up to resolution recommended. Electronically Signed   By: Jasmine Pang M.D.   On: 09/06/2022 16:42    ASSESSMENT & PLAN Hailea Eaglin 88 y.o. female with medical history significant for monoclonal B-cell lymphocytosis who presents for a follow up visit.  # Monoclonal B Cell Lymphocytosis -- Diagnosis confirmed by flow cytometry on 05/10/2022.  Will repeat this test today as the findings last time were quite modest. -- Labs today show white blood cell count 9.5, hemoglobin 12.6, MCV 94.2, and platelets of 196 -- No evidence of lymphadenopathy or clear signs of this is progression to CLL. --At this time recommend monitoring for progression to CLL  -- Plan for return to clinic in 6 months time.  If her labs are stable at that time could recommend yearly follow-up.  No orders of the defined types were placed in this encounter.   All questions were answered. The patient knows to call the clinic with any problems, questions or concerns.  A total of more than 30 minutes were spent on this encounter with face-to-face time and non-face-to-face time, including preparing to see the patient, ordering tests and/or medications, counseling the patient and coordination of care as outlined above.   Ulysees Barns, MD Department of Hematology/Oncology [facility] Phone: [phone] Pager: [phone] Email: [email or web address]  09/17/2022 6:00 PM

## 2022-09-13 NOTE — Progress Notes (Signed)
[facility] - Clinic Note  09/19/2022     CHIEF COMPLAINT Patient presents for Retina Follow Up  HISTORY OF PRESENT ILLNESS: Kayla Hurley is a 88 y.o. female who presents to the clinic today for:   HPI     Retina Follow Up   Patient presents with  Wet AMD.  In both eyes.  This started 3 months ago.  Duration of 3 months.  Since onset it is stable.  I, the attending physician,  performed the HPI with the patient and updated documentation appropriately.        Comments   3 month retina follow up ARMD pt is reporting she lost her glasses for a while and has since found them she feels like her vision maybe a little off she has some floaters but denies flashes of light pt has been little dizzy over the last few weeks       Last edited by Rennis Chris, MD on 09/19/2022  1:52 PM.    Pt states she has been hospitalized twice since Easter, she states the first time it was bc she fell, she was taken to [facility] ED where she was discharged after evaluation, pt states after being sent home, she went back to the ED at [facility] and was dx with pneumonia, pt states vision is darker  Referring physician: Lula Olszewski, MD [address] [place],  [place] [address]  HISTORICAL INFORMATION:   Selected notes from the MEDICAL RECORD NUMBER Referred by Dr. Alben Spittle for concern of exu ARMD OD   CURRENT MEDICATIONS: Current Outpatient Medications (Ophthalmic Drugs)  Medication Sig   timolol (TIMOPTIC) 0.5 % ophthalmic solution Place 1 drop into both eyes 2 (two) times daily.   No current facility-administered medications for this visit. (Ophthalmic Drugs)   Current Outpatient Medications (Other)  Medication Sig   acetaminophen (TYLENOL) 325 MG tablet Take 2 tablets (650 mg total) by mouth every 6 (six) hours as needed for mild pain (or Fever >/= 101).   bisacodyl (DULCOLAX) 5 MG EC tablet Take 5 mg by mouth daily as needed for moderate constipation.    Calcium-Magnesium-Zinc (CAL-MAG-ZINC PO) Take 1-2 tablets by mouth daily.   Cholecalciferol (VITAMIN D3) 125 MCG (5000 UT) capsule Take 5,000 Units by mouth daily.   clobetasol (TEMOVATE) 0.05 % external solution Apply 1 Application topically 2 (two) times daily as needed (as directed- to affected area(s); avoid face/groin/underarms).   diclofenac Sodium (VOLTAREN) 1 % GEL Apply 2 g topically daily as needed (affected sites- for pain).   fluticasone (CUTIVATE) 0.05 % cream Apply 1 Application topically 2 (two) times daily as needed (as directed- to affected area(s)).   folic acid (FOLVITE) 1 MG tablet Take 2 mg by mouth daily.   gabapentin (NEURONTIN) 100 MG capsule Take 1 capsule (100 mg total) by mouth at bedtime. (Patient not taking: Reported on 09/10/2022)   GAVISCON EXTRA STRENGTH 160-105 MG CHEW Chew 1 tablet by mouth every 6 (six) hours as needed (for gas).   guaiFENesin-dextromethorphan (ROBITUSSIN DM) 100-10 MG/5ML syrup Take 10 mLs by mouth every 4 (four) hours as needed for cough.   hydrochlorothiazide (HYDRODIURIL) 25 MG tablet Take 25 mg by mouth daily.   ketoconazole (NIZORAL) 2 % cream Apply 1 Application topically daily as needed for irritation.   leucovorin (WELLCOVORIN) 5 MG tablet Take 10 mg by mouth every Friday.   levothyroxine (SYNTHROID) 50 MCG tablet Take 50 mcg by mouth daily before breakfast.   methocarbamol (ROBAXIN) 500  MG tablet Take 1 tablet (500 mg total) by mouth every 6 (six) hours as needed for muscle spasms.   [START ON 09/22/2022] methotrexate (RHEUMATREX) 2.5 MG tablet Take 4 tablets (10 mg total) by mouth every Thursday. Resume from next week   mometasone-formoterol (DULERA) 200-5 MCG/ACT AERO Inhale 2 puffs into the lungs 2 (two) times daily.   Multiple Vitamins-Minerals (PRESERVISION AREDS 2) CAPS Take 1 capsule by mouth in the morning and at bedtime.   NON FORMULARY Place 1-2 tablets under the tongue See admin instructions. Hyland's leg cramp tablets- Dissolve  1-2 tablets under the tongue every four hours as needed for leg cramps   potassium chloride (KLOR-CON) 10 MEQ tablet Take 10 mEq by mouth in the morning and at bedtime.   predniSONE (DELTASONE) 20 MG tablet Take 1 tablet (20 mg total) by mouth daily with breakfast for 7 days.   primidone (MYSOLINE) 50 MG tablet TAKE 1 TABLET BY MOUTH AT BEDTIME (Patient taking differently: Take 50 mg by mouth at bedtime.)   salsalate (DISALCID) 750 MG tablet Take 1,500 mg by mouth 2 (two) times daily.   simethicone (MYLICON) 125 MG chewable tablet Chew 125 mg by mouth every 6 (six) hours as needed for flatulence.   traMADol (ULTRAM) 50 MG tablet Take 1 tablet (50 mg total) by mouth every 6 (six) hours as needed for moderate pain.   VENTOLIN HFA 108 (90 Base) MCG/ACT inhaler Inhale 1 puff into the lungs every 4 (four) hours as needed for shortness of breath.   No current facility-administered medications for this visit. (Other)   REVIEW OF SYSTEMS: ROS   Positive for: Neurological, Musculoskeletal, Eyes, Respiratory Negative for: Constitutional, Gastrointestinal, Skin, Genitourinary, HENT, Endocrine, Cardiovascular, Psychiatric, Allergic/Imm, Heme/Lymph Last edited by Etheleen Mayhew, COT on 09/19/2022  9:38 AM.       ALLERGIES Allergies  Allergen Reactions   Adhesive [Tape] Other (See Comments)    Blisters    Cucumber Extract Other (See Comments)    Exact allergic reaction not cited   Lactose Intolerance (Gi) Diarrhea   Penicillins Diarrhea, Nausea And Vomiting and Rash   PAST MEDICAL HISTORY Past Medical History:  Diagnosis Date   COPD (chronic obstructive pulmonary disease)    Glaucoma    Hypertensive retinopathy    Macular degeneration    Meningioma    Rheumatoid arthritis    Past Surgical History:  Procedure Laterality Date   APPENDECTOMY     CATARACT EXTRACTION     CESAREAN SECTION     x 3   CHOLECYSTECTOMY     CRANIECTOMY / CRANIOTOMY FOR EXCISION OF BRAIN TUMOR      meningioma   EYE SURGERY     HYSTEROSCOPY WITH D & C     INGUINAL HERNIA REPAIR     YAG LASER APPLICATION     FAMILY HISTORY Family History  Problem Relation Age of Onset   Other Mother        died during open heart surgery   Heart disease Father    Lung cancer Maternal Grandfather        smoker   Diabetes Son    SOCIAL HISTORY Social History   Tobacco Use   Smoking status: Former    Packs/day: 1.00    Years: 50.00    Additional pack years: 0.00    Total pack years: 50.00    Types: Cigarettes   Smokeless tobacco: Never   Tobacco comments:    Quit smoking 1990's  Vaping Use  Vaping Use: Never used  Substance Use Topics   Alcohol use: Not Currently    Comment: occasional   Drug use: Never       OPHTHALMIC EXAM:  Base Eye Exam     Visual Acuity (Snellen - Linear)       Right Left   Dist cc 20/25 20/60   Dist ph cc  NI    Correction: Glasses         Tonometry (Tonopen, 9:43 AM)       Right Left   Pressure 13 17         Pupils       Pupils Dark Light Shape React APD   Right PERRL 2 1 Round Brisk None   Left PERRL 2 1 Round Brisk None         Visual Fields       Left Right    Full Full         Extraocular Movement       Right Left    Full, Ortho Full, Ortho         Neuro/Psych     Oriented x3: Yes   Mood/Affect: Normal         Dilation     Both eyes: 2.5% Phenylephrine @ 9:43 AM           Slit Lamp and Fundus Exam     Slit Lamp Exam       Right Left   Lids/Lashes Dermato Dermato, mild MGD   Conjunctiva/Sclera White and quiet White and quiet   Cornea Mild arcus, 1-2+PEE, mild tear film debris 1-2+ inferior PEE, arcus   Anterior Chamber Deep and quiet Deep and quiet   Iris Round and dilated Round and dilated   Lens PCIOL in excellent position, open pc PCIOL in excellent position, open PC   Anterior Vitreous Syneresis, silicone oil micro bubbles syneresis         Fundus Exam       Right Left   Disc Pink,  sharp, +cupping, central pallor, mild temporal PPA Pink and sharp, +cupping   C/D Ratio 0.7 0.6   Macula Blunted foveal reflex, drusen, RPE mottling, clumping and atrophy, +CNV with stable improvement in shallow SRF/edema, no frank heme, +GA Flat, blunted foveal reflex, Drusen, RPE mottling, clumping and atrophy, +GA superior and nasal to fovea, No heme or edema, mild refractile drusen   Vessels attenuated, Tortuous attenuated, Tortuous   Periphery Attached, mild peripheral drusen, mild reticular degeneration, focal paving stone inferiorly, no heme Attached, mild peripheral drusen and mild reticular degeneration, paving stone inferiorly           Refraction     Wearing Rx       Sphere Cylinder Axis Add   Right -0.50 +1.75 010 +2.50   Left -2.50 +2.00 175 +2.50           IMAGING AND PROCEDURES  Imaging and Procedures for 09/19/2022  OCT, Retina - OU - Both Eyes       Right Eye Quality was good. Central Foveal Thickness: 283. Progression has been stable. Findings include normal foveal contour, no IRF, no SRF, retinal drusen , pigment epithelial detachment, outer retinal atrophy (Stable improvement in [facility] and edema overlying PED IT fovea, mild interval progression of GA on en face image).   Left Eye Quality was good. Central Foveal Thickness: 251. Progression has been stable. Findings include normal foveal contour, no IRF, no SRF, retinal drusen , outer retinal  atrophy (Mild progression of GA on en face imaging).   Notes *Images captured and stored on drive  Diagnosis / Impression:  OD: exudative ARMD -- Stable improvement in [facility] and edema overlying PED IT fovea, mild interval progression of GA on en face image OS: nonexudative ARMD;  NFP, no IRF/SRF -- Mild progression of GA on en face imaging  Clinical management:  See below  Abbreviations: NFP - Normal foveal profile. CME - cystoid macular edema. PED - pigment epithelial detachment. IRF - intraretinal fluid. SRF -  subretinal fluid. EZ - ellipsoid zone. ERM - epiretinal membrane. ORA - outer retinal atrophy. ORT - outer retinal tubulation. SRHM - subretinal hyper-reflective material. IRHM - intraretinal hyper-reflective material             ASSESSMENT/PLAN:    ICD-10-CM   1. Exudative age-related macular degeneration of right eye with active choroidal neovascularization  H35.3211 OCT, Retina - OU - Both Eyes    2. Intermediate stage nonexudative age-related macular degeneration of left eye  H35.3122     3. Amblyopia of eye, left  H53.002     4. Primary open angle glaucoma (POAG) of both eyes, moderate stage  H40.1132     5. Essential hypertension  I10     6. Hypertensive retinopathy of both eyes  H35.033     7. Pseudophakia of both eyes  Z96.1     8. Dry eyes, bilateral  H04.123      1. 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             - s/p IVA OD # 1 (07.29.21), #2 (08.30.21), #3 (09.27.21), #4 (11.02.21), #5 (12.13.21), #6 (01.31.22), #7 (03.24.22), #8 (09.26.22), #9 (11.14.22), #10 (01.03.23), #11 (02.27.23), #12 (05.08.23), #13 (07.31.23)  - excellent initial response and maintenance  - BCVA 20/25 - decreased  - OCT shows stable improvement in [facility] and edema overlying PED IT fovea, stable GA on en face image at 9 mos since last IVA OD  - discussed findings and treatment options   - recommend holding IVA today with follow up in 4-6 mos  - pt in agreement - [facility] informed consent obtained, signed and scanned 05.08.23  - f/u in 4-6 months, sooner prn -- DFE, OCT  2. Age related macular degeneration, non-exudative, OS  - The incidence, anatomy, and pathology of dry AMD, risk of progression, and the AREDS and AREDS 2 studies including smoking risks discussed with patient.  - recommend Amsler grid monitoring  - OCT shows mild interval progression of GA  3. Amblyopia OS  - long  standing  - baseline BCVA ~20/50-60  - monitor  4. POAG OU  - was under the expert management of Dr. Alben Spittle  - on timolol BID OU per Dr. Alben Spittle  - IOP  5,6. Hypertensive retinopathy OU - discussed importance of tight BP control - monitor  7. Pseudophakia OU  - s/p CE/IOL  - IOL in good position, doing well  - monitor  8. Dry eyes OU (OD>OS)  - previously managed by Dr. Alben Spittle   Ophthalmic Meds Ordered this visit:  No orders of the defined types were placed in this encounter.    Return for f/u 4-6 months, exu ARMD OD, DFE, OCT.  There are no Patient Instructions on file for this visit.  This document serves as a record of services personally performed by Arlys John  Wynona NeatG. Patrece Tallie, MD, PhD. It was created on their behalf by Annalee Gentaaryl Barber, COMT. The creation of this record is the provider's dictation and/or activities during the visit.  Electronically signed by: Annalee Gentaaryl Barber, COMT 09/19/22 1:52 PM  This document serves as a record of services personally performed by Karie ChimeraBrian G. Kassi Esteve, MD, PhD. It was created on their behalf by Glee ArvinAmanda J. Manson PasseyBrown, OA an ophthalmic technician. The creation of this record is the provider's dictation and/or activities during the visit.    Electronically signed by: Glee ArvinAmanda J. Manson PasseyBrown, OA @TODAY @ 1:52 PM   Karie ChimeraBrian G. Kelsen Celona, M.D., Ph.D. Diseases & Surgery of the Retina and Vitreous [facility]  I have reviewed the above documentation for accuracy and completeness, and I agree with the above. Karie ChimeraBrian G. Cloyd Ragas, M.D., Ph.D. 09/19/22 1:54 PM   Abbreviations: M myopia (nearsighted); A astigmatism; H hyperopia (farsighted); P presbyopia; Mrx spectacle prescription;  CTL contact lenses; OD right eye; OS left eye; OU both eyes  XT exotropia; ET esotropia; PEK punctate epithelial keratitis; PEE punctate epithelial erosions; DES dry eye syndrome; MGD meibomian gland dysfunction; ATs artificial tears; PFAT's preservative free artificial tears; NSC nuclear  sclerotic cataract; PSC posterior subcapsular cataract; ERM epi-retinal membrane; PVD posterior vitreous detachment; RD retinal detachment; DM diabetes mellitus; DR diabetic retinopathy; NPDR non-proliferative diabetic retinopathy; PDR proliferative diabetic retinopathy; CSME clinically significant macular edema; DME diabetic macular edema; dbh dot blot hemorrhages; CWS cotton wool spot; POAG primary open angle glaucoma; C/D cup-to-disc ratio; HVF humphrey visual field; GVF goldmann visual field; OCT optical coherence tomography; IOP intraocular pressure; BRVO Branch retinal vein occlusion; CRVO central retinal vein occlusion; CRAO central retinal artery occlusion; BRAO branch retinal artery occlusion; RT retinal tear; SB scleral buckle; PPV pars plana vitrectomy; VH Vitreous hemorrhage; PRP panretinal laser photocoagulation; IVK intravitreal kenalog; VMT vitreomacular traction; MH Macular hole;  NVD neovascularization of the disc; NVE neovascularization elsewhere; AREDS age related eye disease study; ARMD age related macular degeneration; POAG primary open angle glaucoma; EBMD epithelial/anterior basement membrane dystrophy; ACIOL anterior chamber intraocular lens; IOL intraocular lens; PCIOL posterior chamber intraocular lens; Phaco/IOL phacoemulsification with intraocular lens placement; PRK photorefractive keratectomy; LASIK laser assisted in situ keratomileusis; HTN hypertension; DM diabetes mellitus; COPD chronic obstructive pulmonary disease

## 2022-09-14 ENCOUNTER — Inpatient Hospital Stay: Payer: Medicare Other | Attending: Hematology and Oncology

## 2022-09-14 ENCOUNTER — Inpatient Hospital Stay (HOSPITAL_BASED_OUTPATIENT_CLINIC_OR_DEPARTMENT_OTHER): Payer: Medicare Other | Admitting: Hematology and Oncology

## 2022-09-14 ENCOUNTER — Other Ambulatory Visit: Payer: Self-pay

## 2022-09-14 VITALS — BP 151/65 | HR 94 | Temp 97.7°F | Resp 16 | Wt 138.2 lb

## 2022-09-14 DIAGNOSIS — D72820 Lymphocytosis (symptomatic): Secondary | ICD-10-CM | POA: Diagnosis present

## 2022-09-14 DIAGNOSIS — Z79899 Other long term (current) drug therapy: Secondary | ICD-10-CM | POA: Insufficient documentation

## 2022-09-14 DIAGNOSIS — Z87891 Personal history of nicotine dependence: Secondary | ICD-10-CM | POA: Diagnosis not present

## 2022-09-14 LAB — CBC WITH DIFFERENTIAL (CANCER CENTER ONLY)
Abs Immature Granulocytes: 0.42 10*3/uL — ABNORMAL HIGH (ref 0.00–0.07)
Basophils Absolute: 0.1 10*3/uL (ref 0.0–0.1)
Basophils Relative: 1 %
Eosinophils Absolute: 0 10*3/uL (ref 0.0–0.5)
Eosinophils Relative: 0 %
HCT: 37.4 % (ref 36.0–46.0)
Hemoglobin: 12.6 g/dL (ref 12.0–15.0)
Immature Granulocytes: 4 %
Lymphocytes Relative: 14 %
Lymphs Abs: 1.4 10*3/uL (ref 0.7–4.0)
MCH: 31.7 pg (ref 26.0–34.0)
MCHC: 33.7 g/dL (ref 30.0–36.0)
MCV: 94.2 fL (ref 80.0–100.0)
Monocytes Absolute: 2 10*3/uL — ABNORMAL HIGH (ref 0.1–1.0)
Monocytes Relative: 21 %
Neutro Abs: 5.7 10*3/uL (ref 1.7–7.7)
Neutrophils Relative %: 60 %
Platelet Count: 196 10*3/uL (ref 150–400)
RBC: 3.97 MIL/uL (ref 3.87–5.11)
RDW: 15.2 % (ref 11.5–15.5)
WBC Count: 9.5 10*3/uL (ref 4.0–10.5)
nRBC: 0 % (ref 0.0–0.2)

## 2022-09-14 LAB — CMP (CANCER CENTER ONLY)
ALT: 48 U/L — ABNORMAL HIGH (ref 0–44)
AST: 58 U/L — ABNORMAL HIGH (ref 15–41)
Albumin: 3.7 g/dL (ref 3.5–5.0)
Alkaline Phosphatase: 40 U/L (ref 38–126)
Anion gap: 9 (ref 5–15)
BUN: 17 mg/dL (ref 8–23)
CO2: 31 mmol/L (ref 22–32)
Calcium: 9.8 mg/dL (ref 8.9–10.3)
Chloride: 100 mmol/L (ref 98–111)
Creatinine: 0.82 mg/dL (ref 0.44–1.00)
GFR, Estimated: >60 mL/min (ref >60)
Glucose, Bld: 131 mg/dL — ABNORMAL HIGH (ref 70–99)
Potassium: 3.3 mmol/L — ABNORMAL LOW (ref 3.5–5.1)
Sodium: 140 mmol/L (ref 135–145)
Total Bilirubin: 0.4 mg/dL (ref 0.3–1.2)
Total Protein: 7.1 g/dL (ref 6.5–8.1)

## 2022-09-14 LAB — LACTATE DEHYDROGENASE: LDH: 349 U/L — ABNORMAL HIGH (ref 98–192)

## 2022-09-15 ENCOUNTER — Other Ambulatory Visit (HOSPITAL_COMMUNITY): Payer: Self-pay

## 2022-09-15 ENCOUNTER — Telehealth (HOSPITAL_BASED_OUTPATIENT_CLINIC_OR_DEPARTMENT_OTHER): Payer: Self-pay | Admitting: Pulmonary Disease

## 2022-09-15 NOTE — Telephone Encounter (Signed)
Patient states she discharged from the hospital and patient was given an inhaler that has helped her tremendously. Patient would like a refill before she runs out since it is helping so well. Pt asked if she needed to bee sooner for the refill or could wait until 11/2022 recall time. Please advise and call patient with an update.   Dulera 200/5 Mcg/act inhaler 2 puff 2x daily   Pharmacy: [facility] [address] P: [phone]

## 2022-09-16 LAB — SURGICAL PATHOLOGY

## 2022-09-19 ENCOUNTER — Ambulatory Visit (INDEPENDENT_AMBULATORY_CARE_PROVIDER_SITE_OTHER): Payer: Medicare Other | Admitting: Ophthalmology

## 2022-09-19 ENCOUNTER — Encounter (INDEPENDENT_AMBULATORY_CARE_PROVIDER_SITE_OTHER): Payer: Self-pay | Admitting: Ophthalmology

## 2022-09-19 DIAGNOSIS — H353211 Exudative age-related macular degeneration, right eye, with active choroidal neovascularization: Secondary | ICD-10-CM | POA: Diagnosis not present

## 2022-09-19 DIAGNOSIS — H353122 Nonexudative age-related macular degeneration, left eye, intermediate dry stage: Secondary | ICD-10-CM

## 2022-09-19 DIAGNOSIS — H53002 Unspecified amblyopia, left eye: Secondary | ICD-10-CM | POA: Diagnosis not present

## 2022-09-19 DIAGNOSIS — Z961 Presence of intraocular lens: Secondary | ICD-10-CM

## 2022-09-19 DIAGNOSIS — H401132 Primary open-angle glaucoma, bilateral, moderate stage: Secondary | ICD-10-CM

## 2022-09-19 DIAGNOSIS — H35033 Hypertensive retinopathy, bilateral: Secondary | ICD-10-CM | POA: Diagnosis not present

## 2022-09-19 DIAGNOSIS — I10 Essential (primary) hypertension: Secondary | ICD-10-CM

## 2022-09-19 DIAGNOSIS — H04123 Dry eye syndrome of bilateral lacrimal glands: Secondary | ICD-10-CM

## 2022-09-19 MED ORDER — MOMETASONE FURO-FORMOTEROL FUM 200-5 MCG/ACT IN AERO: 2.0000 | INHALATION_SPRAY | Freq: Two times a day (BID) | RESPIRATORY_TRACT | 0 refills | Status: DC

## 2022-09-19 NOTE — Telephone Encounter (Signed)
Called and spoke with patient. Patient stated that this inhaler has helped her a lot and it doesn't make her shake. Patient verified pharmacy. Rx has been sent to patients pharmacy.   Nothing further needed.

## 2022-09-20 LAB — FLOW CYTOMETRY

## 2022-10-03 ENCOUNTER — Other Ambulatory Visit (HOSPITAL_COMMUNITY): Payer: Self-pay

## 2022-10-21 ENCOUNTER — Other Ambulatory Visit: Payer: Self-pay | Admitting: Neurology

## 2022-11-12 ENCOUNTER — Other Ambulatory Visit (HOSPITAL_COMMUNITY): Payer: Self-pay

## 2022-11-14 ENCOUNTER — Other Ambulatory Visit (HOSPITAL_COMMUNITY): Payer: Self-pay

## 2022-11-14 ENCOUNTER — Telehealth: Payer: Self-pay

## 2022-11-14 NOTE — Telephone Encounter (Signed)
Pt returning missed call. 

## 2022-11-14 NOTE — Telephone Encounter (Signed)
*  Pulm  PA request received via CMM for Dulera 200-5MCG/ACT aerosol  *albuterol makes patient shaky   Not submitted at this time due to question of trial of preferred alternatives, pending response from office.  Key: [facility]

## 2022-11-14 NOTE — Telephone Encounter (Signed)
ATC X1 LVM for patient. Need to know if patient has ever tried [place] or air duo

## 2022-11-17 MED ORDER — FLUTICASONE FUROATE-VILANTEROL 200-25 MCG/ACT IN AEPB: 1.0000 | INHALATION_SPRAY | Freq: Every day | RESPIRATORY_TRACT | 5 refills | Status: AC

## 2022-11-17 NOTE — Telephone Encounter (Signed)
She has only tried albuterol and Dulera for inhalers.  Discussed with patient. She is willing to try Breo. Reviewed that this is a powder inhaler and taken once a day.  Will schedule for follow-up with me in 1 month after trying [facility]

## 2022-11-17 NOTE — Telephone Encounter (Signed)
I called the pt and there was no answer- LMTCB and routing back to [facility] for continued f/u

## 2022-12-15 ENCOUNTER — Ambulatory Visit (HOSPITAL_BASED_OUTPATIENT_CLINIC_OR_DEPARTMENT_OTHER): Payer: PRIVATE HEALTH INSURANCE | Admitting: Pulmonary Disease

## 2023-01-12 NOTE — Progress Notes (Signed)
[facility] - Clinic Note  01/16/2023     CHIEF COMPLAINT Patient presents for Retina Follow Up  HISTORY OF PRESENT ILLNESS: Kayla Hurley is a 88 y.o. female who presents to the clinic today for:   HPI     Retina Follow Up   Patient presents with  Wet AMD.  In right eye.  This started 4 months ago.  I, the attending physician,  performed the HPI with the patient and updated documentation appropriately.        Comments   Patient here for 4 months retina follow up for exu ARMD OD. Patient states vision is worse. Things are darker. Has to use a light every where goes. Sees Dr Zenaida Niece August 29th to get a new RX for glasses. Using glaucoma drops. No eye pain.      Last edited by Rennis Chris, MD on 01/16/2023 11:48 AM.    Patient feels the vision is getting darker.   Referring physician: No referring provider defined for this encounter.  HISTORICAL INFORMATION:   Selected notes from the MEDICAL RECORD NUMBER Referred by Dr. Alben Spittle for concern of exu ARMD OD   CURRENT MEDICATIONS: Current Outpatient Medications (Ophthalmic Drugs)  Medication Sig   timolol (TIMOPTIC) 0.5 % ophthalmic solution Place 1 drop into both eyes 2 (two) times daily.   No current facility-administered medications for this visit. (Ophthalmic Drugs)   Current Outpatient Medications (Other)  Medication Sig   acetaminophen (TYLENOL) 325 MG tablet Take 2 tablets (650 mg total) by mouth every 6 (six) hours as needed for mild pain (or Fever >/= 101).   bisacodyl (DULCOLAX) 5 MG EC tablet Take 5 mg by mouth daily as needed for moderate constipation.   Calcium-Magnesium-Zinc (CAL-MAG-ZINC PO) Take 1-2 tablets by mouth daily.   Cholecalciferol (VITAMIN D3) 125 MCG (5000 UT) capsule Take 5,000 Units by mouth daily.   clobetasol (TEMOVATE) 0.05 % external solution Apply 1 Application topically 2 (two) times daily as needed (as directed- to affected area(s); avoid face/groin/underarms).    diclofenac Sodium (VOLTAREN) 1 % GEL Apply 2 g topically daily as needed (affected sites- for pain).   fluticasone (CUTIVATE) 0.05 % cream Apply 1 Application topically 2 (two) times daily as needed (as directed- to affected area(s)).   fluticasone furoate-vilanterol (BREO ELLIPTA) 200-25 MCG/ACT AEPB Inhale 1 puff into the lungs daily.   folic acid (FOLVITE) 1 MG tablet Take 2 mg by mouth daily.   GAVISCON EXTRA STRENGTH 160-105 MG CHEW Chew 1 tablet by mouth every 6 (six) hours as needed (for gas).   guaiFENesin-dextromethorphan (ROBITUSSIN DM) 100-10 MG/5ML syrup Take 10 mLs by mouth every 4 (four) hours as needed for cough.   hydrochlorothiazide (HYDRODIURIL) 25 MG tablet Take 25 mg by mouth daily.   ketoconazole (NIZORAL) 2 % cream Apply 1 Application topically daily as needed for irritation.   leucovorin (WELLCOVORIN) 5 MG tablet Take 10 mg by mouth every Friday.   levothyroxine (SYNTHROID) 50 MCG tablet Take 50 mcg by mouth daily before breakfast.   methocarbamol (ROBAXIN) 500 MG tablet Take 1 tablet (500 mg total) by mouth every 6 (six) hours as needed for muscle spasms.   methotrexate (RHEUMATREX) 2.5 MG tablet Take 4 tablets (10 mg total) by mouth every Thursday. Resume from next week   Multiple Vitamins-Minerals (PRESERVISION AREDS 2) CAPS Take 1 capsule by mouth in the morning and at bedtime.   NON FORMULARY Place 1-2 tablets under the tongue See admin instructions. Hyland's leg  cramp tablets- Dissolve 1-2 tablets under the tongue every four hours as needed for leg cramps   potassium chloride (KLOR-CON) 10 MEQ tablet Take 10 mEq by mouth in the morning and at bedtime.   primidone (MYSOLINE) 50 MG tablet TAKE 1/2 TABLET BY MOUTH AT BEDTIME   salsalate (DISALCID) 750 MG tablet Take 1,500 mg by mouth 2 (two) times daily.   simethicone (MYLICON) 125 MG chewable tablet Chew 125 mg by mouth every 6 (six) hours as needed for flatulence.   traMADol (ULTRAM) 50 MG tablet Take 1 tablet (50 mg  total) by mouth every 6 (six) hours as needed for moderate pain.   VENTOLIN HFA 108 (90 Base) MCG/ACT inhaler Inhale 1 puff into the lungs every 4 (four) hours as needed for shortness of breath.   gabapentin (NEURONTIN) 100 MG capsule Take 1 capsule (100 mg total) by mouth at bedtime. (Patient not taking: Reported on 09/10/2022)   No current facility-administered medications for this visit. (Other)   REVIEW OF SYSTEMS: ROS   Positive for: Neurological, Musculoskeletal, Eyes, Respiratory Negative for: Constitutional, Gastrointestinal, Skin, Genitourinary, HENT, Endocrine, Cardiovascular, Psychiatric, Allergic/Imm, Heme/Lymph Last edited by Laddie Aquas, COA on 01/16/2023  9:22 AM.     ALLERGIES Allergies  Allergen Reactions   Adhesive [Tape] Other (See Comments)    Blisters    Cucumber Extract Other (See Comments)    Exact allergic reaction not cited   Lactose Intolerance (Gi) Diarrhea   Penicillins Diarrhea, Nausea And Vomiting and Rash   PAST MEDICAL HISTORY Past Medical History:  Diagnosis Date   COPD (chronic obstructive pulmonary disease) (HCC)    Glaucoma    Hypertensive retinopathy    Macular degeneration    Meningioma (HCC)    Rheumatoid arthritis (HCC)    Past Surgical History:  Procedure Laterality Date   APPENDECTOMY     CATARACT EXTRACTION     CESAREAN SECTION     x 3   CHOLECYSTECTOMY     CRANIECTOMY / CRANIOTOMY FOR EXCISION OF BRAIN TUMOR     meningioma   EYE SURGERY     HYSTEROSCOPY WITH D & C     INGUINAL HERNIA REPAIR     YAG LASER APPLICATION     FAMILY HISTORY Family History  Problem Relation Age of Onset   Other Mother        died during open heart surgery   Heart disease Father    Lung cancer Maternal Grandfather        smoker   Diabetes Son    SOCIAL HISTORY Social History   Tobacco Use   Smoking status: Former    Current packs/day: 1.00    Average packs/day: 1 pack/day for 50.0 years (50.0 ttl pk-yrs)    Types: Cigarettes    Smokeless tobacco: Never   Tobacco comments:    Quit smoking 1990's  Vaping Use   Vaping status: Never Used  Substance Use Topics   Alcohol use: Not Currently    Comment: occasional   Drug use: Never       OPHTHALMIC EXAM:  Base Eye Exam     Visual Acuity (Snellen - Linear)       Right Left   Dist cc 20/25 20/60 -2   Dist ph cc  NI    Correction: Glasses         Tonometry (Tonopen, 9:18 AM)       Right Left   Pressure 15 16  Pupils       Dark Light Shape React APD   Right 2 1 Round Brisk None   Left 2 1 Round Brisk None         Visual Fields (Counting fingers)       Left Right    Full Full         Extraocular Movement       Right Left    Full, Ortho Full, Ortho         Neuro/Psych     Oriented x3: Yes   Mood/Affect: Normal         Dilation     Both eyes: 1.0% Mydriacyl, 2.5% Phenylephrine @ 9:18 AM           Slit Lamp and Fundus Exam     Slit Lamp Exam       Right Left   Lids/Lashes Dermatochalasis - upper lid Dermato, mild MGD   Conjunctiva/Sclera White and quiet White and quiet   Cornea Mild arcus, 1-2+PEE, mild tear film debris 2+ inferior PEE, arcus   Anterior Chamber Deep and clear Deep and clear   Iris Round and dilated Round and dilated   Lens PCIOL in excellent position, open pc PCIOL in excellent position, open PC   Anterior Vitreous Syneresis, silicone oil micro bubbles syneresis         Fundus Exam       Right Left   Disc Pink, sharp, +cupping, central pallor, mild temporal PPA Pink and sharp, +cupping   C/D Ratio 0.7 0.6   Macula Blunted foveal reflex, drusen, RPE mottling, clumping and atrophy, +CNV with stable improvement in shallow SRF/edema, no frank heme, mild progression of GA Flat, blunted foveal reflex, Drusen, RPE mottling, clumping and atrophy, mild progression of GA superior and nasal to fovea, No heme or edema, mild refractile drusen   Vessels attenuated, Tortuous attenuated, Tortuous    Periphery Attached, mild peripheral drusen, mild reticular degeneration, focal paving stone inferiorly, no heme Attached, mild peripheral drusen and mild reticular degeneration, paving stone inferiorly, No heme           Refraction     Wearing Rx       Sphere Cylinder Axis Add   Right -0.50 +1.75 010 +2.50   Left -2.50 +2.00 175 +2.50           IMAGING AND PROCEDURES  Imaging and Procedures for 01/16/2023  OCT, Retina - OU - Both Eyes       Right Eye Quality was good. Central Foveal Thickness: 293. Progression has worsened. Findings include normal foveal contour, no IRF, no SRF, retinal drusen , pigment epithelial detachment, outer retinal atrophy (Stable improvement in [facility] and edema overlying PED IT fovea, mild interval progression of GA on en face image).   Left Eye Quality was good. Central Foveal Thickness: 301. Progression has worsened. Findings include normal foveal contour, no IRF, no SRF, retinal drusen , outer retinal atrophy (Mild progression of GA on en face imaging).   Notes *Images captured and stored on drive  Diagnosis / Impression:  OD: exudative ARMD -- Stable improvement in [facility] and edema overlying PED IT fovea, mild interval progression of GA on en face image OS: nonexudative ARMD;  NFP, no IRF/SRF -- Mild progression of GA on en face imaging  Clinical management:  See below  Abbreviations: NFP - Normal foveal profile. CME - cystoid macular edema. PED - pigment epithelial detachment. IRF - intraretinal fluid. SRF - subretinal fluid. EZ -  ellipsoid zone. ERM - epiretinal membrane. ORA - outer retinal atrophy. ORT - outer retinal tubulation. SRHM - subretinal hyper-reflective material. IRHM - intraretinal hyper-reflective material            ASSESSMENT/PLAN:    ICD-10-CM   1. Exudative age-related macular degeneration of right eye with active choroidal neovascularization (HCC)  H35.3211 OCT, Retina - OU - Both Eyes    2. Intermediate stage  nonexudative age-related macular degeneration of left eye  H35.3122     3. Amblyopia of eye, left  H53.002     4. Primary open angle glaucoma (POAG) of both eyes, moderate stage  H40.1132     5. Essential hypertension  I10     6. Hypertensive retinopathy of both eyes  H35.033     7. Pseudophakia of both eyes  Z96.1     8. Dry eyes, bilateral  H04.123      1. 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   - s/p IVA OD # 1 (07.29.21), #2 (08.30.21), #3 (09.27.21), #4 (11.02.21), #5 (12.13.21), #6 (01.31.22), #7 (03.24.22), #8 (09.26.22), #9 (11.14.22), #10 (01.03.23), #11 (02.27.23), #12 (05.08.23), #13 (07.31.23)  - excellent initial response and maintenance  - BCVA OD 20/25 - stable - OCT shows Stable improvement in [facility] and edema overlying PED IT fovea, mild interval progression of GA on en face image at 12.5 mos since last IVA OD  - discussed findings and treatment options   - recommend holding IVA today with follow up in 6 mos  - pt in agreement - [facility] informed consent obtained, signed and scanned 05.08.23  - f/u in 6 months, sooner prn -- DFE, OCT  2. Age related macular degeneration, non-exudative, OS - The incidence, anatomy, and pathology of dry AMD, risk of progression, and the AREDS and AREDS 2 studies including smoking risks discussed with patient.  - recommend Amsler grid monitoring  - OCT shows interval progression of GA  - BCVA OS stable at 20/60  3. Amblyopia OS  - long standing  - baseline BCVA ~20/50-60  - monitor  4. POAG OU  - was under the expert management of Dr. Alben Spittle  - on Timolol BID OU per Dr. Alben Spittle  - IOP OD 15,OS 16  5,6. Hypertensive retinopathy OU - discussed importance of tight BP control - monitor  7. Pseudophakia OU  - s/p CE/IOL  - IOL in good position, doing well  - monitor  8. Dry eyes OU (OD>OS)  - previously managed by Dr.  Alben Spittle   Ophthalmic Meds Ordered this visit:  No orders of the defined types were placed in this encounter.    Return in about 6 months (around 07/19/2023) for f/u Ex. AMD OD , DFE, OCT.  There are no Patient Instructions on file for this visit.  This document serves as a record of services personally performed by Karie Chimera, MD, PhD. It was created on their behalf by Annalee Genta, COMT. The creation of this record is the provider's dictation and/or activities during the visit.  Electronically signed by: Annalee Genta, COMT 01/16/23 11:49 AM  This document serves as a record of services personally performed by Karie Chimera, MD, PhD. It was created on their behalf by Glee Arvin. Manson Passey, OA an ophthalmic technician. The creation of this record is the provider's dictation and/or activities during the visit.    Electronically signed by:  Glee Arvin. Manson Passey, OA 01/16/23 11:49 AM  This document serves as a record of services personally performed by Karie Chimera, MD, PhD. It was created on their behalf by Gerilyn Nestle, COT an ophthalmic technician. The creation of this record is the provider's dictation and/or activities during the visit.    Electronically signed by:  Charlette Caffey, COT  01/16/23 11:49 AM   Karie Chimera, M.D., Ph.D. Diseases & Surgery of the Retina and Vitreous [facility]  I have reviewed the above documentation for accuracy and completeness, and I agree with the above. Karie Chimera, M.D., Ph.D. 01/16/23 11:51 AM  Abbreviations: M myopia (nearsighted); A astigmatism; H hyperopia (farsighted); P presbyopia; Mrx spectacle prescription;  CTL contact lenses; OD right eye; OS left eye; OU both eyes  XT exotropia; ET esotropia; PEK punctate epithelial keratitis; PEE punctate epithelial erosions; DES dry eye syndrome; MGD meibomian gland dysfunction; ATs artificial tears; PFAT's preservative free artificial tears; NSC nuclear sclerotic  cataract; PSC posterior subcapsular cataract; ERM epi-retinal membrane; PVD posterior vitreous detachment; RD retinal detachment; DM diabetes mellitus; DR diabetic retinopathy; NPDR non-proliferative diabetic retinopathy; PDR proliferative diabetic retinopathy; CSME clinically significant macular edema; DME diabetic macular edema; dbh dot blot hemorrhages; CWS cotton wool spot; POAG primary open angle glaucoma; C/D cup-to-disc ratio; HVF humphrey visual field; GVF goldmann visual field; OCT optical coherence tomography; IOP intraocular pressure; BRVO Branch retinal vein occlusion; CRVO central retinal vein occlusion; CRAO central retinal artery occlusion; BRAO branch retinal artery occlusion; RT retinal tear; SB scleral buckle; PPV pars plana vitrectomy; VH Vitreous hemorrhage; PRP panretinal laser photocoagulation; IVK intravitreal kenalog; VMT vitreomacular traction; MH Macular hole;  NVD neovascularization of the disc; NVE neovascularization elsewhere; AREDS age related eye disease study; ARMD age related macular degeneration; POAG primary open angle glaucoma; EBMD epithelial/anterior basement membrane dystrophy; ACIOL anterior chamber intraocular lens; IOL intraocular lens; PCIOL posterior chamber intraocular lens; Phaco/IOL phacoemulsification with intraocular lens placement; PRK photorefractive keratectomy; LASIK laser assisted in situ keratomileusis; HTN hypertension; DM diabetes mellitus; COPD chronic obstructive pulmonary disease

## 2023-01-16 ENCOUNTER — Ambulatory Visit (INDEPENDENT_AMBULATORY_CARE_PROVIDER_SITE_OTHER): Payer: Medicare Other | Admitting: Ophthalmology

## 2023-01-16 ENCOUNTER — Encounter (INDEPENDENT_AMBULATORY_CARE_PROVIDER_SITE_OTHER): Payer: Self-pay | Admitting: Ophthalmology

## 2023-01-16 DIAGNOSIS — I10 Essential (primary) hypertension: Secondary | ICD-10-CM | POA: Diagnosis not present

## 2023-01-16 DIAGNOSIS — Z961 Presence of intraocular lens: Secondary | ICD-10-CM

## 2023-01-16 DIAGNOSIS — H04123 Dry eye syndrome of bilateral lacrimal glands: Secondary | ICD-10-CM

## 2023-01-16 DIAGNOSIS — H353122 Nonexudative age-related macular degeneration, left eye, intermediate dry stage: Secondary | ICD-10-CM | POA: Diagnosis not present

## 2023-01-16 DIAGNOSIS — H353211 Exudative age-related macular degeneration, right eye, with active choroidal neovascularization: Secondary | ICD-10-CM | POA: Diagnosis not present

## 2023-01-16 DIAGNOSIS — H53002 Unspecified amblyopia, left eye: Secondary | ICD-10-CM | POA: Diagnosis not present

## 2023-01-16 DIAGNOSIS — H401132 Primary open-angle glaucoma, bilateral, moderate stage: Secondary | ICD-10-CM | POA: Diagnosis not present

## 2023-01-16 DIAGNOSIS — H35033 Hypertensive retinopathy, bilateral: Secondary | ICD-10-CM

## 2023-02-02 NOTE — Progress Notes (Deleted)
Assessment/Plan:    1.  Essential Tremor  -Patient will continue on primidone, 50 mg, half tablet nightly.  She thinks that it does help even at this tiny dose.  She, however, has cognitive side effects even at this dose and does not take it when she needs to be fully cognitively aware.  She continues to have fairly significant tremor, however.   -Patient discontinued her propranolol due to low blood pressure.  She did not tolerate extremely low-dose topiramate, although I am not really sure that the side effects she reported (GI upset with 25 mg) was really related to the topiramate, as she only took it for 2 days `-she isn't interested in focused ultrasound or DBS surgery due to age, which is understandable. -I have explored other options with her.   She does not want Lyrica or gabapentin.  She was not interested in [employer].  She had asked about Xanax, but I really did not want to give that given potential side effects, especially cognitive side effects and risks for falls. -discussed botox trial for ET.  This likely would be very helpful but would require a trial to [place] but would require a drive.  She states that she just cannot drive there. -We had tried to schedule a [facility], but they were not able to reach her when called.  We only did this because she had a bit of rest tremor, but she does not really meet any criteria for Parkinson's otherwise, so we decided to go ahead and hold on that today.  Subjective:   Kayla Hurley was seen today in follow up for essential tremor.  My previous records were reviewed prior to todays visit.  Last visit, we decided to try low-dose gabapentin, 100 mg.  She called in a few days later stating that she decided not to take it because it had an interaction with primidone.  I told her it did not, but ultimately she stated that she did not want to take the gabapentin as she worried about potential interactions and side effects.  She was admitted to the  hospital in April after a fall, associated with COVID-pneumonia.  She was discharged from the hospital on April 3 and bounced right back the following day after another feeling significantly weak and having another fall.  She was readmitted to the hospital for another 5 days and discharged back to home with home health.  Current prescribed movement disorder medications: Primidone, 50 mg, 1/2 at bedtime Propranolol, 10 mg 1 tablet (not on this)   Previously tried tremor medications:  propranolol, 10 mg 3 times per day (BP low); xanax helped it; Topamax, 25 mg twice daily (patient took it for 2 days and felt that it upset her stomach and stopped it); gabapentin (given, but she did not take it as she worried about potential side effects)    ALLERGIES:   Allergies  Allergen Reactions   Adhesive [Tape] Other (See Comments)    Blisters    Cucumber Extract Other (See Comments)    Exact allergic reaction not cited   Lactose Intolerance (Gi) Diarrhea   Penicillins Diarrhea, Nausea And Vomiting and Rash    CURRENT MEDICATIONS:  Outpatient Encounter Medications as of 02/07/2023  Medication Sig   acetaminophen (TYLENOL) 325 MG tablet Take 2 tablets (650 mg total) by mouth every 6 (six) hours as needed for mild pain (or Fever >/= 101).   bisacodyl (DULCOLAX) 5 MG EC tablet Take 5 mg by mouth daily  as needed for moderate constipation.   Calcium-Magnesium-Zinc (CAL-MAG-ZINC PO) Take 1-2 tablets by mouth daily.   Cholecalciferol (VITAMIN D3) 125 MCG (5000 UT) capsule Take 5,000 Units by mouth daily.   clobetasol (TEMOVATE) 0.05 % external solution Apply 1 Application topically 2 (two) times daily as needed (as directed- to affected area(s); avoid face/groin/underarms).   diclofenac Sodium (VOLTAREN) 1 % GEL Apply 2 g topically daily as needed (affected sites- for pain).   fluticasone (CUTIVATE) 0.05 % cream Apply 1 Application topically 2 (two) times daily as needed (as directed- to affected area(s)).    fluticasone furoate-vilanterol (BREO ELLIPTA) 200-25 MCG/ACT AEPB Inhale 1 puff into the lungs daily.   folic acid (FOLVITE) 1 MG tablet Take 2 mg by mouth daily.   gabapentin (NEURONTIN) 100 MG capsule Take 1 capsule (100 mg total) by mouth at bedtime. (Patient not taking: Reported on 09/10/2022)   GAVISCON EXTRA STRENGTH 160-105 MG CHEW Chew 1 tablet by mouth every 6 (six) hours as needed (for gas).   guaiFENesin-dextromethorphan (ROBITUSSIN DM) 100-10 MG/5ML syrup Take 10 mLs by mouth every 4 (four) hours as needed for cough.   hydrochlorothiazide (HYDRODIURIL) 25 MG tablet Take 25 mg by mouth daily.   ketoconazole (NIZORAL) 2 % cream Apply 1 Application topically daily as needed for irritation.   leucovorin (WELLCOVORIN) 5 MG tablet Take 10 mg by mouth every Friday.   levothyroxine (SYNTHROID) 50 MCG tablet Take 50 mcg by mouth daily before breakfast.   methocarbamol (ROBAXIN) 500 MG tablet Take 1 tablet (500 mg total) by mouth every 6 (six) hours as needed for muscle spasms.   methotrexate (RHEUMATREX) 2.5 MG tablet Take 4 tablets (10 mg total) by mouth every Thursday. Resume from next week   Multiple Vitamins-Minerals (PRESERVISION AREDS 2) CAPS Take 1 capsule by mouth in the morning and at bedtime.   NON FORMULARY Place 1-2 tablets under the tongue See admin instructions. Hyland's leg cramp tablets- Dissolve 1-2 tablets under the tongue every four hours as needed for leg cramps   potassium chloride (KLOR-CON) 10 MEQ tablet Take 10 mEq by mouth in the morning and at bedtime.   primidone (MYSOLINE) 50 MG tablet TAKE 1/2 TABLET BY MOUTH AT BEDTIME   salsalate (DISALCID) 750 MG tablet Take 1,500 mg by mouth 2 (two) times daily.   simethicone (MYLICON) 125 MG chewable tablet Chew 125 mg by mouth every 6 (six) hours as needed for flatulence.   timolol (TIMOPTIC) 0.5 % ophthalmic solution Place 1 drop into both eyes 2 (two) times daily.   traMADol (ULTRAM) 50 MG tablet Take 1 tablet (50 mg total)  by mouth every 6 (six) hours as needed for moderate pain.   VENTOLIN HFA 108 (90 Base) MCG/ACT inhaler Inhale 1 puff into the lungs every 4 (four) hours as needed for shortness of breath.   No facility-administered encounter medications on file as of 02/07/2023.     Objective:    PHYSICAL EXAMINATION:    VITALS:   There were no vitals filed for this visit.     GEN:  The patient appears stated age and is in NAD. HEENT:  Normocephalic, atraumatic.  The mucous membranes are moist. The superficial temporal arteries are without ropiness or tenderness. CV:  RRR Lungs:  CTAB Neck/HEME:  There are no carotid bruits bilaterally.  Neurological examination:  Orientation: The patient is alert and oriented x3. Cranial nerves: There is good facial symmetry. The speech is fluent and clear. Soft palate rises symmetrically and there is  no tongue deviation. Hearing is intact to conversational tone. Sensation: Sensation is intact to light touch throughout Motor: Strength is at least antigravity x4.  Movement examination: Tone: There is normal tone in the UE/LE Abnormal movements: there is LUE rest tremor intermittently and with ambulation.  No postural tremor.  Genevie Cheshire does not have a significant degree of intention tremor.  Has significant archimedes spirals trouble on the L and right today, which is worse than last visit. Coordination:  There is no decremation with RAM's, with any form of RAMS, including alternating supination and pronation of the forearm, hand opening and closing, finger taps, heel taps and toe taps. Gait and Station: The patient has no difficulty arising out of a deep-seated chair without the use of the hands. The patient ambulates well but has LUE tremor    Total time spent on today's visit was *** minutes, including both face-to-face time and nonface-to-face time.  Time included that spent on review of records (prior notes available to me/labs/imaging if pertinent), discussing  treatment and goals, answering patient's questions and coordinating care.  Cc:  Pcp, No

## 2023-02-07 ENCOUNTER — Encounter: Payer: Self-pay | Admitting: Neurology

## 2023-02-07 ENCOUNTER — Ambulatory Visit: Payer: PRIVATE HEALTH INSURANCE | Admitting: Neurology

## 2023-03-15 ENCOUNTER — Other Ambulatory Visit: Payer: Self-pay

## 2023-03-15 DIAGNOSIS — D72820 Lymphocytosis (symptomatic): Secondary | ICD-10-CM

## 2023-03-16 ENCOUNTER — Inpatient Hospital Stay (HOSPITAL_BASED_OUTPATIENT_CLINIC_OR_DEPARTMENT_OTHER): Payer: Medicare Other | Admitting: Hematology and Oncology

## 2023-03-16 ENCOUNTER — Inpatient Hospital Stay: Payer: Medicare Other | Attending: Hematology and Oncology

## 2023-03-16 VITALS — BP 154/60 | HR 62 | Temp 97.6°F | Resp 13 | Wt 137.5 lb

## 2023-03-16 DIAGNOSIS — D72820 Lymphocytosis (symptomatic): Secondary | ICD-10-CM

## 2023-03-16 DIAGNOSIS — Z87891 Personal history of nicotine dependence: Secondary | ICD-10-CM | POA: Insufficient documentation

## 2023-03-16 DIAGNOSIS — Z79899 Other long term (current) drug therapy: Secondary | ICD-10-CM | POA: Insufficient documentation

## 2023-03-16 LAB — CMP (CANCER CENTER ONLY)
ALT: 11 U/L (ref 0–44)
AST: 19 U/L (ref 15–41)
Albumin: 3.9 g/dL (ref 3.5–5.0)
Alkaline Phosphatase: 46 U/L (ref 38–126)
Anion gap: 8 (ref 5–15)
BUN: 23 mg/dL (ref 8–23)
CO2: 31 mmol/L (ref 22–32)
Calcium: 9.7 mg/dL (ref 8.9–10.3)
Chloride: 103 mmol/L (ref 98–111)
Creatinine: 0.78 mg/dL (ref 0.44–1.00)
GFR, Estimated: >60 mL/min (ref >60)
Glucose, Bld: 80 mg/dL (ref 70–99)
Potassium: 3.9 mmol/L (ref 3.5–5.1)
Sodium: 142 mmol/L (ref 135–145)
Total Bilirubin: 0.4 mg/dL (ref 0.3–1.2)
Total Protein: 6.9 g/dL (ref 6.5–8.1)

## 2023-03-16 LAB — CBC WITH DIFFERENTIAL (CANCER CENTER ONLY)
Abs Immature Granulocytes: 0.04 10*3/uL (ref 0.00–0.07)
Basophils Absolute: 0 10*3/uL (ref 0.0–0.1)
Basophils Relative: 1 %
Eosinophils Absolute: 0.1 10*3/uL (ref 0.0–0.5)
Eosinophils Relative: 1 %
HCT: 39.6 % (ref 36.0–46.0)
Hemoglobin: 13.3 g/dL (ref 12.0–15.0)
Immature Granulocytes: 1 %
Lymphocytes Relative: 23 %
Lymphs Abs: 1.1 10*3/uL (ref 0.7–4.0)
MCH: 32 pg (ref 26.0–34.0)
MCHC: 33.6 g/dL (ref 30.0–36.0)
MCV: 95.4 fL (ref 80.0–100.0)
Monocytes Absolute: 1.1 10*3/uL — ABNORMAL HIGH (ref 0.1–1.0)
Monocytes Relative: 23 %
Neutro Abs: 2.4 10*3/uL (ref 1.7–7.7)
Neutrophils Relative %: 51 %
Platelet Count: 93 10*3/uL — ABNORMAL LOW (ref 150–400)
RBC: 4.15 MIL/uL (ref 3.87–5.11)
RDW: 16.1 % — ABNORMAL HIGH (ref 11.5–15.5)
WBC Count: 4.7 10*3/uL (ref 4.0–10.5)
nRBC: 0 % (ref 0.0–0.2)

## 2023-03-16 LAB — LACTATE DEHYDROGENASE: LDH: 280 U/L — ABNORMAL HIGH (ref 98–192)

## 2023-03-16 NOTE — Progress Notes (Signed)
[facility] [phone]   [phone]  PROGRESS NOTE  Patient Care Team: Pcp, No as PCP - General Tat, Octaviano Batty, DO as Consulting Physician (Neurology)  Hematological/Oncological History # Monoclonal B Cell Lymphocytosis  -04/15/2022: Labs from rheumatologist, Dr. Stefano Gaul Aryal-Hgb 12.9, WBC 4.8, Plt 181, ESR 8, CRP 0.58 (H). Blood smear showed high number of atypical lymphocytes measuring approximately 16%.    -05/10/2022: Establish care with [facility] Hematology with Dr. Jeanie Sewer and Georga Kaufmann PA-C. Flow cytometry showed findings consistent with minimal/early involvement by a B-cell lymphoproliferative process particularly monoclonal B-cell lymphocytosis     Interval History:  Kayla Hurley 88 y.o. female with medical history significant for monoclonal B-cell lymphocytosis who presents for a follow up visit. The patient's last visit was on 09/14/2022. In the interim since the last visit her diagnosis was confirmed with flow cytometry showing a B-cell lymphoproliferative process, consistent with monoclonal B-cell lymphocytosis.  On exam today Kayla Hurley reports that she has been well overall and interim since her last visit though she did have an episode of COVID negative for pneumonia.  She reports that she also had some issues with her low potassium.  She reports that she also had a recent fall and has not yet been x-rayed but they are currently planning for x-rays of her "backside".  She notes that she is also been going to physical therapy and notes that she does not enjoy home health physical therapy and would prefer to travel to the physical therapy site.  She reports her energy levels have been improving since she began drinking electrolyte beverages.  She also does have some occasional dizzy spells.  She reports that she is not having any issues with bleeding, bruising, or dark stools.  She reports that her bowel movements have been regular.  She  notes that she tries to work her feet and legs and got an elliptical at home as well as some weights.  She has not developed any bumps or lumps concerning for lymphadenopathy.  She denies any early satiety or abdominal swelling.  She has not been having any trouble with fevers, chills, sweats, nausea, vomiting or diarrhea.  A full 10 point ROS is otherwise negative.  MEDICAL HISTORY:  Past Medical History:  Diagnosis Date   COPD (chronic obstructive pulmonary disease) (HCC)    Glaucoma    Hypertensive retinopathy    Macular degeneration    Meningioma (HCC)    Rheumatoid arthritis (HCC)     SURGICAL HISTORY: Past Surgical History:  Procedure Laterality Date   APPENDECTOMY     CATARACT EXTRACTION     CESAREAN SECTION     x 3   CHOLECYSTECTOMY     CRANIECTOMY / CRANIOTOMY FOR EXCISION OF BRAIN TUMOR     meningioma   EYE SURGERY     HYSTEROSCOPY WITH D & C     INGUINAL HERNIA REPAIR     YAG LASER APPLICATION      SOCIAL HISTORY: Social History   Socioeconomic History   Marital status: Widowed    Spouse name: Not on file   Number of children: Not on file   Years of education: Not on file   Highest education level: Not on file  Occupational History   Not on file  Tobacco Use   Smoking status: Former    Current packs/day: 1.00    Average packs/day: 1 pack/day for 50.0 years (50.0 ttl pk-yrs)    Types: Cigarettes   Smokeless tobacco:  Never   Tobacco comments:    Quit smoking 1990's  Vaping Use   Vaping status: Never Used  Substance and Sexual Activity   Alcohol use: Not Currently    Comment: occasional   Drug use: Never   Sexual activity: Not Currently  Other Topics Concern   Not on file  Social History Narrative   Right handed    Social Determinants of Health   Financial Resource Strain: Not on file  Food Insecurity: No Food Insecurity (09/09/2022)   Hunger Vital Sign    Worried About Running Out of Food in the Last Year: Never true    Ran Out of Food in the  Last Year: Never true  Transportation Needs: No Transportation Needs (09/09/2022)   PRAPARE - Administrator, Civil Service (Medical): No    Lack of Transportation (Non-Medical): No  Physical Activity: Not on file  Stress: Not on file  Social Connections: Not on file  Intimate Partner Violence: Not At Risk (09/09/2022)   Humiliation, Afraid, Rape, and Kick questionnaire    Fear of Current or Ex-Partner: No    Emotionally Abused: No    Physically Abused: No    Sexually Abused: No    FAMILY HISTORY: Family History  Problem Relation Age of Onset   Other Mother        died during open heart surgery   Heart disease Father    Lung cancer Maternal Grandfather        smoker   Diabetes Son     ALLERGIES:  is allergic to adhesive [tape], cucumber extract, lactose intolerance (gi), and penicillins.  MEDICATIONS:  Current Outpatient Medications  Medication Sig Dispense Refill   acetaminophen (TYLENOL) 325 MG tablet Take 2 tablets (650 mg total) by mouth every 6 (six) hours as needed for mild pain (or Fever >/= 101). (Patient not taking: Reported on 03/16/2023)     bisacodyl (DULCOLAX) 5 MG EC tablet Take 5 mg by mouth daily as needed for moderate constipation. (Patient not taking: Reported on 03/16/2023)     Calcium-Magnesium-Zinc (CAL-MAG-ZINC PO) Take 1-2 tablets by mouth daily.     Cholecalciferol (VITAMIN D3) 125 MCG (5000 UT) capsule Take 5,000 Units by mouth daily.     clobetasol (TEMOVATE) 0.05 % external solution Apply 1 Application topically 2 (two) times daily as needed (as directed- to affected area(s); avoid face/groin/underarms).     diclofenac Sodium (VOLTAREN) 1 % GEL Apply 2 g topically daily as needed (affected sites- for pain).     fluticasone (CUTIVATE) 0.05 % cream Apply 1 Application topically 2 (two) times daily as needed (as directed- to affected area(s)).     fluticasone furoate-vilanterol (BREO ELLIPTA) 200-25 MCG/ACT AEPB Inhale 1 puff into the lungs daily.  60 each 5   folic acid (FOLVITE) 1 MG tablet Take 2 mg by mouth daily.     gabapentin (NEURONTIN) 100 MG capsule Take 1 capsule (100 mg total) by mouth at bedtime. (Patient not taking: Reported on 09/10/2022) 90 capsule 1   GAVISCON EXTRA STRENGTH 160-105 MG CHEW Chew 1 tablet by mouth every 6 (six) hours as needed (for gas).     guaiFENesin-dextromethorphan (ROBITUSSIN DM) 100-10 MG/5ML syrup Take 10 mLs by mouth every 4 (four) hours as needed for cough. 236 mL 0   hydrochlorothiazide (HYDRODIURIL) 25 MG tablet Take 25 mg by mouth daily.     ketoconazole (NIZORAL) 2 % cream Apply 1 Application topically daily as needed for irritation.  leucovorin (WELLCOVORIN) 5 MG tablet Take 10 mg by mouth every Friday.     levothyroxine (SYNTHROID) 50 MCG tablet Take 50 mcg by mouth daily before breakfast.     methocarbamol (ROBAXIN) 500 MG tablet Take 1 tablet (500 mg total) by mouth every 6 (six) hours as needed for muscle spasms. (Patient not taking: Reported on 03/16/2023) 30 tablet 0   methotrexate (RHEUMATREX) 2.5 MG tablet Take 4 tablets (10 mg total) by mouth every Thursday. Resume from next week     Multiple Vitamins-Minerals (PRESERVISION AREDS 2) CAPS Take 1 capsule by mouth in the morning and at bedtime.     NON FORMULARY Place 1-2 tablets under the tongue See admin instructions. Hyland's leg cramp tablets- Dissolve 1-2 tablets under the tongue every four hours as needed for leg cramps     potassium chloride (KLOR-CON) 10 MEQ tablet Take 10 mEq by mouth in the morning and at bedtime.     primidone (MYSOLINE) 50 MG tablet TAKE 1/2 TABLET BY MOUTH AT BEDTIME 90 tablet 0   salsalate (DISALCID) 750 MG tablet Take 1,500 mg by mouth 2 (two) times daily.     simethicone (MYLICON) 125 MG chewable tablet Chew 125 mg by mouth every 6 (six) hours as needed for flatulence. (Patient not taking: Reported on 03/16/2023)     timolol (TIMOPTIC) 0.5 % ophthalmic solution Place 1 drop into both eyes 2 (two) times  daily.     traMADol (ULTRAM) 50 MG tablet Take 1 tablet (50 mg total) by mouth every 6 (six) hours as needed for moderate pain. 14 tablet 0   VENTOLIN HFA 108 (90 Base) MCG/ACT inhaler Inhale 1 puff into the lungs every 4 (four) hours as needed for shortness of breath. 1 each 0   No current facility-administered medications for this visit.    REVIEW OF SYSTEMS:   Constitutional: ( - ) fevers, ( - )  chills , ( - ) night sweats Eyes: ( - ) blurriness of vision, ( - ) double vision, ( - ) watery eyes Ears, nose, mouth, throat, and face: ( - ) mucositis, ( - ) sore throat Respiratory: ( - ) cough, ( - ) dyspnea, ( - ) wheezes Cardiovascular: ( - ) palpitation, ( - ) chest discomfort, ( - ) lower extremity swelling Gastrointestinal:  ( - ) nausea, ( - ) heartburn, ( - ) change in bowel habits Skin: ( - ) abnormal skin rashes Lymphatics: ( - ) new lymphadenopathy, ( - ) easy bruising Neurological: ( - ) numbness, ( - ) tingling, ( - ) new weaknesses Behavioral/Psych: ( - ) mood change, ( - ) new changes  All other systems were reviewed with the patient and are negative.  PHYSICAL EXAMINATION:  Vitals:   03/16/23 1059  BP: (!) 154/60  Pulse: 62  Resp: 13  Temp: 97.6 F (36.4 C)  SpO2: 98%    Filed Weights   03/16/23 1059  Weight: 137 lb 8 oz (62.4 kg)     GENERAL: Well-appearing elderly Caucasian female, alert, no distress and comfortable SKIN: skin color, texture, turgor are normal, no rashes or significant lesions EYES: conjunctiva are pink and non-injected, sclera clear LUNGS: clear to auscultation and percussion with normal breathing effort HEART: regular rate & rhythm and no murmurs and no lower extremity edema Musculoskeletal: no cyanosis of digits and no clubbing  PSYCH: alert & oriented x 3, fluent speech NEURO: no focal motor/sensory deficits  LABORATORY DATA:  I have reviewed the  data as listed    Latest Ref Rng & Units 03/16/2023   10:34 AM 09/14/2022    9:55  AM 09/11/2022    7:07 AM  CBC  WBC 4.0 - 10.5 K/uL 4.7  9.5  5.8   Hemoglobin 12.0 - 15.0 g/dL 40.9  81.1  91.4   Hematocrit 36.0 - 46.0 % 39.6  37.4  36.7   Platelets 150 - 400 K/uL 93  196  117        Latest Ref Rng & Units 03/16/2023   10:34 AM 09/14/2022    9:55 AM 09/12/2022    3:17 AM  CMP  Glucose 70 - 99 mg/dL 80  782  69   BUN 8 - 23 mg/dL 23  17  18    Creatinine 0.44 - 1.00 mg/dL 9.56  2.13  0.86   Sodium 135 - 145 mmol/L 142  140  137   Potassium 3.5 - 5.1 mmol/L 3.9  3.3  4.3   Chloride 98 - 111 mmol/L 103  100  101   CO2 22 - 32 mmol/L 31  31  29    Calcium 8.9 - 10.3 mg/dL 9.7  9.8  8.9   Total Protein 6.5 - 8.1 g/dL 6.9  7.1    Total Bilirubin 0.3 - 1.2 mg/dL 0.4  0.4    Alkaline Phos 38 - 126 U/L 46  40    AST 15 - 41 U/L 19  58    ALT 0 - 44 U/L 11  48      RADIOGRAPHIC STUDIES: No results found.  ASSESSMENT & PLAN Kayla Hurley 88 y.o. female with medical history significant for monoclonal B-cell lymphocytosis who presents for a follow up visit.  # Monoclonal B Cell Lymphocytosis -- Diagnosis confirmed by flow cytometry on 05/10/2022.  Will repeat this test today as the findings last time were quite modest. -- Labs today show white blood cell count 4.7, hemoglobin 13.3, MCV 95.4, and platelets of 93.  Of note the platelets are highly fluctuant, suspecting ITP like condition.  Do not suspect that her platelets are low due to her monoclonal B-cell lymphocytosis -- No evidence of lymphadenopathy or clear signs of this is progression to CLL. --At this time recommend monitoring for progression to CLL  -- Plan for return to clinic in 12 months time.    No orders of the defined types were placed in this encounter.   All questions were answered. The patient knows to call the clinic with any problems, questions or concerns.  A total of more than 30 minutes were spent on this encounter with face-to-face time and non-face-to-face time, including preparing to see the  patient, ordering tests and/or medications, counseling the patient and coordination of care as outlined above.   Ulysees Barns, MD Department of Hematology/Oncology [facility] Phone: [phone] Pager: [phone] Email: [email or web address]  03/16/2023 5:29 PM

## 2023-03-22 ENCOUNTER — Ambulatory Visit
Admission: RE | Admit: 2023-03-22 | Discharge: 2023-03-22 | Disposition: A | Discharge status: Home / Self Care | Payer: Medicare Other | Source: Ambulatory Visit | Attending: Adult Health | Admitting: Adult Health

## 2023-03-22 ENCOUNTER — Other Ambulatory Visit: Payer: Self-pay | Admitting: Adult Health

## 2023-03-22 DIAGNOSIS — M25552 Pain in left hip: Secondary | ICD-10-CM

## 2023-03-22 DIAGNOSIS — R1030 Lower abdominal pain, unspecified: Secondary | ICD-10-CM

## 2023-04-09 ENCOUNTER — Other Ambulatory Visit: Payer: Self-pay

## 2023-04-09 ENCOUNTER — Inpatient Hospital Stay (HOSPITAL_COMMUNITY)
Admission: EM | Admit: 2023-04-09 | Discharge: 2023-04-19 | DRG: 350 | Disposition: A | Discharge status: Skilled Nursing Facility | Payer: Medicare Other | Attending: Internal Medicine | Admitting: Internal Medicine

## 2023-04-09 ENCOUNTER — Emergency Department (HOSPITAL_COMMUNITY): Payer: Medicare Other

## 2023-04-09 DIAGNOSIS — Z8616 Personal history of COVID-19: Secondary | ICD-10-CM

## 2023-04-09 DIAGNOSIS — R0902 Hypoxemia: Secondary | ICD-10-CM | POA: Diagnosis present

## 2023-04-09 DIAGNOSIS — J4489 Other specified chronic obstructive pulmonary disease: Secondary | ICD-10-CM | POA: Diagnosis present

## 2023-04-09 DIAGNOSIS — K56609 Unspecified intestinal obstruction, unspecified as to partial versus complete obstruction: Secondary | ICD-10-CM | POA: Diagnosis present

## 2023-04-09 DIAGNOSIS — K55069 Acute infarction of intestine, part and extent unspecified: Secondary | ICD-10-CM | POA: Diagnosis present

## 2023-04-09 DIAGNOSIS — M069 Rheumatoid arthritis, unspecified: Secondary | ICD-10-CM | POA: Diagnosis present

## 2023-04-09 DIAGNOSIS — W19XXXA Unspecified fall, initial encounter: Principal | ICD-10-CM

## 2023-04-09 DIAGNOSIS — D696 Thrombocytopenia, unspecified: Secondary | ICD-10-CM | POA: Diagnosis present

## 2023-04-09 DIAGNOSIS — Z9981 Dependence on supplemental oxygen: Secondary | ICD-10-CM

## 2023-04-09 DIAGNOSIS — Z801 Family history of malignant neoplasm of trachea, bronchus and lung: Secondary | ICD-10-CM

## 2023-04-09 DIAGNOSIS — S20219A Contusion of unspecified front wall of thorax, initial encounter: Secondary | ICD-10-CM | POA: Diagnosis present

## 2023-04-09 DIAGNOSIS — G250 Essential tremor: Secondary | ICD-10-CM | POA: Diagnosis present

## 2023-04-09 DIAGNOSIS — Z1152 Encounter for screening for COVID-19: Secondary | ICD-10-CM | POA: Diagnosis not present

## 2023-04-09 DIAGNOSIS — Z8249 Family history of ischemic heart disease and other diseases of the circulatory system: Secondary | ICD-10-CM

## 2023-04-09 DIAGNOSIS — Z86011 Personal history of benign neoplasm of the brain: Secondary | ICD-10-CM

## 2023-04-09 DIAGNOSIS — R296 Repeated falls: Secondary | ICD-10-CM | POA: Diagnosis present

## 2023-04-09 DIAGNOSIS — Z9049 Acquired absence of other specified parts of digestive tract: Secondary | ICD-10-CM | POA: Diagnosis not present

## 2023-04-09 DIAGNOSIS — Z91048 Other nonmedicinal substance allergy status: Secondary | ICD-10-CM

## 2023-04-09 DIAGNOSIS — K4130 Unilateral femoral hernia, with obstruction, without gangrene, not specified as recurrent: Secondary | ICD-10-CM | POA: Diagnosis present

## 2023-04-09 DIAGNOSIS — E785 Hyperlipidemia, unspecified: Secondary | ICD-10-CM | POA: Diagnosis present

## 2023-04-09 DIAGNOSIS — E039 Hypothyroidism, unspecified: Secondary | ICD-10-CM | POA: Diagnosis present

## 2023-04-09 DIAGNOSIS — K4031 Unilateral inguinal hernia, with obstruction, without gangrene, recurrent: Principal | ICD-10-CM | POA: Diagnosis present

## 2023-04-09 DIAGNOSIS — I10 Essential (primary) hypertension: Secondary | ICD-10-CM | POA: Diagnosis present

## 2023-04-09 DIAGNOSIS — Z79899 Other long term (current) drug therapy: Secondary | ICD-10-CM

## 2023-04-09 DIAGNOSIS — Z79631 Long term (current) use of antimetabolite agent: Secondary | ICD-10-CM

## 2023-04-09 DIAGNOSIS — E876 Hypokalemia: Secondary | ICD-10-CM | POA: Diagnosis present

## 2023-04-09 DIAGNOSIS — Y92009 Unspecified place in unspecified non-institutional (private) residence as the place of occurrence of the external cause: Secondary | ICD-10-CM

## 2023-04-09 DIAGNOSIS — E739 Lactose intolerance, unspecified: Secondary | ICD-10-CM | POA: Diagnosis present

## 2023-04-09 DIAGNOSIS — R188 Other ascites: Secondary | ICD-10-CM | POA: Diagnosis present

## 2023-04-09 DIAGNOSIS — K4090 Unilateral inguinal hernia, without obstruction or gangrene, not specified as recurrent: Secondary | ICD-10-CM

## 2023-04-09 DIAGNOSIS — Z87891 Personal history of nicotine dependence: Secondary | ICD-10-CM | POA: Diagnosis not present

## 2023-04-09 DIAGNOSIS — Z79890 Hormone replacement therapy (postmenopausal): Secondary | ICD-10-CM

## 2023-04-09 DIAGNOSIS — R54 Age-related physical debility: Secondary | ICD-10-CM | POA: Diagnosis present

## 2023-04-09 DIAGNOSIS — Z880 Allergy status to penicillin: Secondary | ICD-10-CM

## 2023-04-09 DIAGNOSIS — H401190 Primary open-angle glaucoma, unspecified eye, stage unspecified: Secondary | ICD-10-CM | POA: Diagnosis present

## 2023-04-09 DIAGNOSIS — Z833 Family history of diabetes mellitus: Secondary | ICD-10-CM

## 2023-04-09 DIAGNOSIS — K4091 Unilateral inguinal hernia, without obstruction or gangrene, recurrent: Secondary | ICD-10-CM | POA: Diagnosis not present

## 2023-04-09 LAB — CBC WITH DIFFERENTIAL/PLATELET
Abs Immature Granulocytes: 0.11 10*3/uL — ABNORMAL HIGH (ref 0.00–0.07)
Basophils Absolute: 0 10*3/uL (ref 0.0–0.1)
Basophils Relative: 0 %
Eosinophils Absolute: 0 10*3/uL (ref 0.0–0.5)
Eosinophils Relative: 0 %
HCT: 39.3 % (ref 36.0–46.0)
Hemoglobin: 13.6 g/dL (ref 12.0–15.0)
Immature Granulocytes: 1 %
Lymphocytes Relative: 4 %
Lymphs Abs: 0.5 10*3/uL — ABNORMAL LOW (ref 0.7–4.0)
MCH: 32.4 pg (ref 26.0–34.0)
MCHC: 34.6 g/dL (ref 30.0–36.0)
MCV: 93.6 fL (ref 80.0–100.0)
Monocytes Absolute: 1.5 10*3/uL — ABNORMAL HIGH (ref 0.1–1.0)
Monocytes Relative: 13 %
Neutro Abs: 9.2 10*3/uL — ABNORMAL HIGH (ref 1.7–7.7)
Neutrophils Relative %: 82 %
Platelets: 127 10*3/uL — ABNORMAL LOW (ref 150–400)
RBC: 4.2 MIL/uL (ref 3.87–5.11)
RDW: 15.7 % — ABNORMAL HIGH (ref 11.5–15.5)
WBC: 11.4 10*3/uL — ABNORMAL HIGH (ref 4.0–10.5)
nRBC: 0 % (ref 0.0–0.2)

## 2023-04-09 LAB — COMPREHENSIVE METABOLIC PANEL
ALT: 14 U/L (ref 0–44)
AST: 26 U/L (ref 15–41)
Albumin: 3.4 g/dL — ABNORMAL LOW (ref 3.5–5.0)
Alkaline Phosphatase: 45 U/L (ref 38–126)
Anion gap: 15 (ref 5–15)
BUN: 22 mg/dL (ref 8–23)
CO2: 31 mmol/L (ref 22–32)
Calcium: 9.9 mg/dL (ref 8.9–10.3)
Chloride: 93 mmol/L — ABNORMAL LOW (ref 98–111)
Creatinine, Ser: 0.59 mg/dL (ref 0.44–1.00)
GFR, Estimated: >60 mL/min (ref >60)
Glucose, Bld: 137 mg/dL — ABNORMAL HIGH (ref 70–99)
Potassium: 3.2 mmol/L — ABNORMAL LOW (ref 3.5–5.1)
Sodium: 139 mmol/L (ref 135–145)
Total Bilirubin: 1 mg/dL (ref 0.3–1.2)
Total Protein: 6.5 g/dL (ref 6.5–8.1)

## 2023-04-09 LAB — URINALYSIS, W/ REFLEX TO CULTURE (INFECTION SUSPECTED)
Bilirubin Urine: NEGATIVE
Glucose, UA: NEGATIVE mg/dL
Hgb urine dipstick: NEGATIVE
Ketones, ur: 5 mg/dL — AB
Leukocytes,Ua: NEGATIVE
Nitrite: NEGATIVE
Protein, ur: 30 mg/dL — AB
Specific Gravity, Urine: 1.033 — ABNORMAL HIGH (ref 1.005–1.030)
pH: 5 (ref 5.0–8.0)

## 2023-04-09 LAB — RESP PANEL BY RT-PCR (RSV, FLU A&B, COVID)  RVPGX2
Influenza A by PCR: NEGATIVE
Influenza B by PCR: NEGATIVE
Resp Syncytial Virus by PCR: NEGATIVE
SARS Coronavirus 2 by RT PCR: NEGATIVE

## 2023-04-09 LAB — LIPASE, BLOOD: Lipase: 47 U/L (ref 11–51)

## 2023-04-09 LAB — PHOSPHORUS: Phosphorus: 3.6 mg/dL (ref 2.5–4.6)

## 2023-04-09 LAB — TSH: TSH: 4.043 u[IU]/mL (ref 0.350–4.500)

## 2023-04-09 LAB — MAGNESIUM: Magnesium: 2.1 mg/dL (ref 1.7–2.4)

## 2023-04-09 LAB — BRAIN NATRIURETIC PEPTIDE: B Natriuretic Peptide: 73.4 pg/mL (ref 0.0–100.0)

## 2023-04-09 LAB — LACTIC ACID, PLASMA
Lactic Acid, Venous: 1.1 mmol/L (ref 0.5–1.9)
Lactic Acid, Venous: 1 mmol/L (ref 0.5–1.9)

## 2023-04-09 MED ORDER — FENTANYL CITRATE PF 50 MCG/ML IJ SOSY
50.0000 ug | PREFILLED_SYRINGE | Freq: Once | INTRAMUSCULAR | Status: AC
  Administered 2023-04-09: 50 ug via INTRAVENOUS
  Filled 2023-04-09: qty 1

## 2023-04-09 MED ORDER — PANTOPRAZOLE SODIUM 40 MG IV SOLR
40.0000 mg | INTRAVENOUS | Status: DC
  Administered 2023-04-09 – 2023-04-12 (×4): 40 mg via INTRAVENOUS
  Filled 2023-04-09 (×4): qty 10

## 2023-04-09 MED ORDER — ONDANSETRON HCL 4 MG PO TABS
4.0000 mg | ORAL_TABLET | Freq: Four times a day (QID) | ORAL | Status: DC | PRN
  Administered 2023-04-15: 4 mg via ORAL
  Filled 2023-04-09: qty 1

## 2023-04-09 MED ORDER — TIMOLOL MALEATE 0.5 % OP SOLN
1.0000 [drp] | Freq: Two times a day (BID) | OPHTHALMIC | Status: DC
  Administered 2023-04-09 – 2023-04-19 (×20): 1 [drp] via OPHTHALMIC
  Filled 2023-04-09: qty 5

## 2023-04-09 MED ORDER — ACETAMINOPHEN 325 MG PO TABS
650.0000 mg | ORAL_TABLET | Freq: Four times a day (QID) | ORAL | Status: DC | PRN
Start: 2023-04-09 — End: 2023-04-12
  Filled 2023-04-09: qty 2

## 2023-04-09 MED ORDER — MAGNESIUM SULFATE 2 GM/50ML IV SOLN
2.0000 g | Freq: Once | INTRAVENOUS | Status: AC
  Administered 2023-04-09: 2 g via INTRAVENOUS
  Filled 2023-04-09: qty 50

## 2023-04-09 MED ORDER — ONDANSETRON HCL 4 MG/2ML IJ SOLN
4.0000 mg | Freq: Four times a day (QID) | INTRAMUSCULAR | Status: DC | PRN
  Administered 2023-04-09 – 2023-04-10 (×3): 4 mg via INTRAVENOUS
  Filled 2023-04-09 (×3): qty 2

## 2023-04-09 MED ORDER — ACETAMINOPHEN 650 MG RE SUPP
650.0000 mg | Freq: Four times a day (QID) | RECTAL | Status: DC | PRN
Start: 2023-04-09 — End: 2023-04-12

## 2023-04-09 MED ORDER — FENTANYL CITRATE PF 50 MCG/ML IJ SOSY
25.0000 ug | PREFILLED_SYRINGE | INTRAMUSCULAR | Status: DC | PRN
  Administered 2023-04-10 – 2023-04-12 (×5): 25 ug via INTRAVENOUS
  Filled 2023-04-09 (×5): qty 1

## 2023-04-09 MED ORDER — POTASSIUM CHLORIDE 10 MEQ/100ML IV SOLN
10.0000 meq | INTRAVENOUS | Status: AC
  Administered 2023-04-09: 10 meq via INTRAVENOUS
  Filled 2023-04-09: qty 100

## 2023-04-09 MED ORDER — DIAZEPAM 5 MG/ML IJ SOLN
5.0000 mg | Freq: Once | INTRAMUSCULAR | Status: AC
Start: 2023-04-09 — End: 2023-04-09
  Administered 2023-04-09: 5 mg via INTRAVENOUS
  Filled 2023-04-09: qty 2

## 2023-04-09 MED ORDER — IOHEXOL 300 MG/ML  SOLN
100.0000 mL | Freq: Once | INTRAMUSCULAR | Status: AC | PRN
  Administered 2023-04-09: 100 mL via INTRAVENOUS

## 2023-04-09 MED ORDER — ALBUTEROL SULFATE (2.5 MG/3ML) 0.083% IN NEBU: 3.0000 mL | INHALATION_SOLUTION | RESPIRATORY_TRACT | Status: DC | PRN

## 2023-04-09 MED ORDER — POTASSIUM CHLORIDE IN NACL 40-0.9 MEQ/L-% IV SOLN
INTRAVENOUS | Status: AC
  Filled 2023-04-09 (×2): qty 1000

## 2023-04-09 NOTE — ED Provider Notes (Signed)
[place] [facility] AT [facility] Provider Note   CSN: [id number] Arrival date & time: 04/09/23  0900     History  Chief Complaint  Patient presents with   Fall    Pt presents by ems, post fall x2 since last night, contusion on chest, scattered abrasions of various ages, pt denies any complaints at this time. States has been feeling unwell the past few days states "I either have the flu or  last time I felt this way my potassium was low"     Kayla Hurley is a 88 y.o. female.  The history is provided by the patient and medical records. No language interpreter was used.  Fall This is a new problem. The current episode started 12 to 24 hours ago. The problem has not changed since onset.Associated symptoms include chest pain, abdominal pain and shortness of breath. Pertinent negatives include no headaches. Nothing aggravates the symptoms. Nothing relieves the symptoms. She has tried nothing for the symptoms. The treatment provided no relief.       Home Medications Prior to Admission medications   Medication Sig Start Date End Date Taking? Authorizing Provider  acetaminophen (TYLENOL) 325 MG tablet Take 2 tablets (650 mg total) by mouth every 6 (six) hours as needed for mild pain (or Fever >/= 101). Patient not taking: Reported on 03/16/2023 09/07/22   Evette Georges, MD  bisacodyl (DULCOLAX) 5 MG EC tablet Take 5 mg by mouth daily as needed for moderate constipation. Patient not taking: Reported on 03/16/2023    [provider]  Calcium-Magnesium-Zinc (CAL-MAG-ZINC PO) Take 1-2 tablets by mouth daily.    [provider]  Cholecalciferol (VITAMIN D3) 125 MCG (5000 UT) capsule Take 5,000 Units by mouth daily.    [provider]  clobetasol (TEMOVATE) 0.05 % external solution Apply 1 Application topically 2 (two) times daily as needed (as directed- to affected area(s); avoid face/groin/underarms). 08/28/22   [provider]   diclofenac Sodium (VOLTAREN) 1 % GEL Apply 2 g topically daily as needed (affected sites- for pain). 11/13/17   [provider]  fluticasone (CUTIVATE) 0.05 % cream Apply 1 Application topically 2 (two) times daily as needed (as directed- to affected area(s)). 08/29/22   [provider]  fluticasone furoate-vilanterol (BREO ELLIPTA) 200-25 MCG/ACT AEPB Inhale 1 puff into the lungs daily. 11/17/22   Luciano Cutter, MD  folic acid (FOLVITE) 1 MG tablet Take 2 mg by mouth daily.    [provider]  gabapentin (NEURONTIN) 100 MG capsule Take 1 capsule (100 mg total) by mouth at bedtime. Patient not taking: Reported on 09/10/2022 08/04/22   Tat, Octaviano Batty, DO  GAVISCON EXTRA STRENGTH 160-105 MG CHEW Chew 1 tablet by mouth every 6 (six) hours as needed (for gas).    [provider]  guaiFENesin-dextromethorphan (ROBITUSSIN DM) 100-10 MG/5ML syrup Take 10 mLs by mouth every 4 (four) hours as needed for cough. 09/12/22   Glade Lloyd, MD  hydrochlorothiazide (HYDRODIURIL) 25 MG tablet Take 25 mg by mouth daily. 12/30/20   [provider]  ketoconazole (NIZORAL) 2 % cream Apply 1 Application topically daily as needed for irritation. 08/11/22   [provider]  leucovorin (WELLCOVORIN) 5 MG tablet Take 10 mg by mouth every Friday. 08/12/19   [provider]  levothyroxine (SYNTHROID) 50 MCG tablet Take 50 mcg by mouth daily before breakfast.    [provider]  methocarbamol (ROBAXIN) 500 MG tablet Take 1 tablet (500 mg total)  by mouth every 6 (six) hours as needed for muscle spasms. Patient not taking: Reported on 03/16/2023 09/12/22   Glade Lloyd, MD  methotrexate (RHEUMATREX) 2.5 MG tablet Take 4 tablets (10 mg total) by mouth every Thursday. Resume from next week 09/22/22   Glade Lloyd, MD  Multiple Vitamins-Minerals (PRESERVISION AREDS 2) CAPS Take 1 capsule by mouth in the morning and at bedtime.    [provider]  NON  FORMULARY Place 1-2 tablets under the tongue See admin instructions. Hyland's leg cramp tablets- Dissolve 1-2 tablets under the tongue every four hours as needed for leg cramps    [provider]  potassium chloride (KLOR-CON) 10 MEQ tablet Take 10 mEq by mouth in the morning and at bedtime. 09/06/22   [provider]  primidone (MYSOLINE) 50 MG tablet TAKE 1/2 TABLET BY MOUTH AT BEDTIME 10/21/22   Tat, Octaviano Batty, DO  salsalate (DISALCID) 750 MG tablet Take 1,500 mg by mouth 2 (two) times daily. 09/02/19   [provider]  simethicone (MYLICON) 125 MG chewable tablet Chew 125 mg by mouth every 6 (six) hours as needed for flatulence. Patient not taking: Reported on 03/16/2023    [provider]  timolol (TIMOPTIC) 0.5 % ophthalmic solution Place 1 drop into both eyes 2 (two) times daily. 08/17/22   [provider]  traMADol (ULTRAM) 50 MG tablet Take 1 tablet (50 mg total) by mouth every 6 (six) hours as needed for moderate pain. 09/12/22   Glade Lloyd, MD  VENTOLIN HFA 108 (90 Base) MCG/ACT inhaler Inhale 1 puff into the lungs every 4 (four) hours as needed for shortness of breath. 09/12/22   Glade Lloyd, MD      Allergies    Adhesive [tape], Cucumber extract, Lactose intolerance (gi), and Penicillins    Review of Systems   Review of Systems  Constitutional:  Positive for chills. Negative for fatigue and fever.  HENT:  Positive for congestion.   Respiratory:  Positive for cough, chest tightness and shortness of breath. Negative for wheezing.   Cardiovascular:  Positive for chest pain and leg swelling (chronic). Negative for palpitations.  Gastrointestinal:  Positive for abdominal pain. Negative for abdominal distention (bloating reported), constipation, diarrhea, nausea and vomiting.  Genitourinary:  Negative for dysuria and flank pain.  Musculoskeletal:  Negative for back pain, neck pain and neck stiffness.  Skin:  Positive for wound. Negative for  rash.  Neurological:  Negative for weakness, light-headedness, numbness and headaches.  Psychiatric/Behavioral:  Negative for agitation and confusion.   All other systems reviewed and are negative.   Physical Exam Updated Vital Signs Ht 5\' 1"  (1.549 m)   Wt 59 kg   SpO2 91%   BMI 24.56 kg/m  Physical Exam Vitals and nursing note reviewed.  Constitutional:      General: She is not in acute distress.    Appearance: She is well-developed. She is not ill-appearing, toxic-appearing or diaphoretic.  HENT:     Head: Normocephalic and atraumatic.     Nose: No congestion or rhinorrhea.     Mouth/Throat:     Mouth: Mucous membranes are moist.     Pharynx: No oropharyngeal exudate or posterior oropharyngeal erythema.  Eyes:     Extraocular Movements: Extraocular movements intact.     Conjunctiva/sclera: Conjunctivae normal.     Pupils: Pupils are equal, round, and reactive to light.  Cardiovascular:     Rate and Rhythm: Normal rate and regular rhythm.     Heart sounds:  No murmur heard. Pulmonary:     Effort: Pulmonary effort is normal. No respiratory distress.     Breath sounds: Rhonchi present. No wheezing or rales.  Chest:     Chest wall: Tenderness present.  Abdominal:     General: Abdomen is flat.     Palpations: Abdomen is soft.     Tenderness: There is abdominal tenderness. There is no right CVA tenderness, left CVA tenderness, guarding or rebound.     Hernia: A hernia is present.  Musculoskeletal:        General: Tenderness present. No swelling.     Cervical back: Neck supple.     Right lower leg: Edema present.     Left lower leg: Edema present.  Skin:    General: Skin is warm and dry.     Capillary Refill: Capillary refill takes less than 2 seconds.     Findings: Bruising present.  Neurological:     General: No focal deficit present.     Mental Status: She is alert.     Sensory: No sensory deficit.     Motor: No weakness.  Psychiatric:        Mood and Affect:  Mood normal.     ED Results / Procedures / Treatments   Labs (all labs ordered are listed, but only abnormal results are displayed) Labs Reviewed  CBC WITH DIFFERENTIAL/PLATELET - Abnormal; Notable for the following components:      Result Value   WBC 11.4 (*)    RDW 15.7 (*)    Platelets 127 (*)    Neutro Abs 9.2 (*)    Lymphs Abs 0.5 (*)    Monocytes Absolute 1.5 (*)    Abs Immature Granulocytes 0.11 (*)    All other components within normal limits  COMPREHENSIVE METABOLIC PANEL - Abnormal; Notable for the following components:   Potassium 3.2 (*)    Chloride 93 (*)    Glucose, Bld 137 (*)    Albumin 3.4 (*)    All other components within normal limits  URINALYSIS, W/ REFLEX TO CULTURE (INFECTION SUSPECTED) - Abnormal; Notable for the following components:   Specific Gravity, Urine 1.033 (*)    Ketones, ur 5 (*)    Protein, ur 30 (*)    Bacteria, UA RARE (*)    All other components within normal limits  RESP PANEL BY RT-PCR (RSV, FLU A&B, COVID)  RVPGX2  CULTURE, BLOOD (ROUTINE X 2)  CULTURE, BLOOD (ROUTINE X 2)  LACTIC ACID, PLASMA  LIPASE, BLOOD  LACTIC ACID, PLASMA  TSH  BRAIN NATRIURETIC PEPTIDE    EKG EKG Interpretation Date/Time:  Sunday April 09 2023 09:10:49 EST Ventricular Rate:  77 PR Interval:  167 QRS Duration:  115 QT Interval:  453 QTC Calculation: 513 R Axis:   -61  Text Interpretation: Sinus rhythm Left anterior fascicular block LVH with secondary repolarization abnormality when compared to prior, similar appearance. no STEMI Confirmed by Theda Belfast ([id number]) on 04/09/2023 10:20:34 AM  Radiology CT Cervical Spine Wo Contrast  Result Date: 04/09/2023 CLINICAL DATA:  Poly trauma, blunt.  Multiple falls. EXAM: CT CERVICAL SPINE WITHOUT CONTRAST TECHNIQUE: Multidetector CT imaging of the cervical spine was performed without intravenous contrast. Multiplanar CT image reconstructions were also generated. RADIATION DOSE REDUCTION: This exam was  performed according to the departmental dose-optimization program which includes automated exposure control, adjustment of the mA and/or kV according to patient size and/or use of iterative reconstruction technique. COMPARISON:  CT cervical spine 09/06/2022 FINDINGS: Alignment:  Straightening without focal angulation or listhesis. Skull base and vertebrae: No evidence of acute cervical spine fracture or traumatic subluxation. Multilevel spondylosis, similar to previous study. Soft tissues and spinal canal: No prevertebral fluid or swelling. No visible canal hematoma. Disc levels: No evidence of large disc herniation or high-grade spinal stenosis. There is multilevel spondylosis with disc space narrowing, endplate osteophytes and facet hypertrophy at multiple levels. There is evidence for mild chronic osseous spinal stenosis, greatest at C5-6. Mild foraminal narrowing is present at multiple levels. Upper chest: Stable mild scarring at both lung apices. Other: Bilateral carotid atherosclerosis. IMPRESSION: 1. No evidence of acute cervical spine fracture, traumatic subluxation or static signs of instability. 2. Multilevel cervical spondylosis, similar to previous study. Electronically Signed   By: Carey Bullocks M.D.   On: 04/09/2023 12:31   CT HEAD WO CONTRAST ( )  Result Date: 04/09/2023 CLINICAL DATA:  Multiple falls with bruising on head and neck. History of meningioma resection. EXAM: CT HEAD WITHOUT CONTRAST TECHNIQUE: Contiguous axial images were obtained from the base of the skull through the vertex without intravenous contrast. RADIATION DOSE REDUCTION: This exam was performed according to the departmental dose-optimization program which includes automated exposure control, adjustment of the mA and/or kV according to patient size and/or use of iterative reconstruction technique. COMPARISON:  09/08/2022 FINDINGS: Brain: There is no evidence for acute hemorrhage, hydrocephalus, mass lesion, or abnormal  extra-axial fluid collection. No definite CT evidence for acute infarction. Patchy low attenuation in the deep hemispheric and periventricular white matter is nonspecific, but likely reflects chronic microvascular ischemic demyelination. High right frontal encephalomalacia underlies a craniotomy defect and is stable in the interval. Vascular: No hyperdense vessel or unexpected calcification. Skull: No evidence for fracture. No worrisome lytic or sclerotic lesion. Frontal craniotomy defect noted. Sinuses/Orbits: The visualized paranasal sinuses and mastoid air cells are clear. Visualized portions of the globes and intraorbital fat are unremarkable. Other: None. IMPRESSION: 1. No acute intracranial abnormality. 2. Stable high right frontal encephalomalacia underlying a craniotomy defect. 3. Stable background chronic small vessel ischemic disease. Electronically Signed   By: Kennith Center M.D.   On: 04/09/2023 12:24   CT CHEST ABDOMEN PELVIS W CONTRAST  Result Date: 04/09/2023 CLINICAL DATA:  Multiple falls. Blunt poly trauma. Chest and abdominal pain. EXAM: CT CHEST, ABDOMEN, AND PELVIS WITH CONTRAST TECHNIQUE: Multidetector CT imaging of the chest, abdomen and pelvis was performed following the standard protocol during bolus administration of intravenous contrast. RADIATION DOSE REDUCTION: This exam was performed according to the departmental dose-optimization program which includes automated exposure control, adjustment of the mA and/or kV according to patient size and/or use of iterative reconstruction technique. CONTRAST:  OMNIPAQUE IOHEXOL 300 MG/ML  SOLN COMPARISON:  None Available. FINDINGS: CT CHEST FINDINGS Cardiovascular: No evidence of thoracic aortic injury or mediastinal hematoma. No pericardial effusion. Mediastinum/Nodes: No evidence of hemorrhage or pneumomediastinum. No masses or pathologically enlarged lymph nodes identified. Mild diffuse esophageal wall thickening, suspicious for  esophagitis. Lungs/Pleura: No evidence of pulmonary contusion or acute infiltrate. No evidence of pneumothorax or hemothorax. Mild chronic interstitial lung disease is seen in subpleural regions. Musculoskeletal: No acute fractures or suspicious bone lesions identified. CT ABDOMEN PELVIS FINDINGS Hepatobiliary: No hepatic laceration or mass identified. Prior cholecystectomy. No evidence of biliary obstruction. Pancreas: No parenchymal laceration, mass, or inflammatory changes identified. Spleen: No evidence of splenic laceration. Adrenal/Urinary Tract: No hemorrhage or parenchymal lacerations identified. No evidence of suspicious masses or hydronephrosis. Stomach/Bowel: Diffuse colonic diverticulosis is seen, without signs of  diverticulitis. Mild ascites is seen. Moderately dilated small bowel loops are seen throughout the abdomen and pelvis, with transition point at the site of a small left inguinal hernia containing a single small bowel loop. No evidence of pneumatosis or free intraperitoneal air. Vascular/Lymphatic: No evidence of abdominal aortic injury or retroperitoneal hemorrhage. No pathologically enlarged lymph nodes identified. Reproductive:  No mass or other significant abnormality identified. Other:  None. Musculoskeletal: No acute fractures or suspicious bone lesions identified. IMPRESSION: No evidence of traumatic injury involving the chest, abdomen, or pelvis. Small bowel obstruction due to small left inguinal hernia containing a single small bowel loop. Mild ascites. Colonic diverticulosis, without radiographic evidence of diverticulitis. Mild diffuse esophageal wall thickening, suspicious for esophagitis. Chronic interstitial lung disease. Electronically Signed   By: Danae Orleans M.D.   On: 04/09/2023 12:22    Procedures Procedures    Medications Ordered in ED Medications  fentaNYL (SUBLIMAZE) injection 50 mcg (has no administration in time range)  iohexol (OMNIPAQUE) 300 MG/ML solution 100  mL (100 mLs Intravenous Contrast Given 04/09/23 1141)    ED Course/ Medical Decision Making/ A&P                                 Medical Decision Making Amount and/or Complexity of Data Reviewed Labs: ordered. Radiology: ordered.  Risk Prescription drug management. Decision regarding hospitalization.    ZASHA BELLEAU is a 88 y.o. female with a past medical history significant for hypothyroidism, osteoarthritis, rheumatoid arthritis, dyslipidemia, tremor, asthma, COPD, and previous meningioma status postcraniotomy for excision who presents with falls, chills, hypoxia, and URI symptoms.  According to patient, she has had some fatigue and chills for the last few days and has had more dry cough.  She reports he does not shortness of breath and per EMS report to nursing and oxygen saturations in the mid to low 80s on room air.  She does not take oxygen normally.  She is on 2 L now.  She says that overnight she has had 2 falls but does not remember exactly what happened.  She is complaining of some pain in her chest and her abdomen and said she had a bulge with a hernia appearance in her left lower abdomen yesterday that has resolved.  Denies pain in her back.  Denies significant headache or neck pain but does have some bruising to the top of the head and to her neck.  Denies any focal numbness, tingling, or weakness.  Denies any urinary symptoms.  On exam, lungs had some rhonchi.  Patient has skin tear to her chest with dried blood and tenderness.  No crepitance.  Abdomen slightly tender in the epigastric area.  Left lower abdomen and pelvis nontender but this is where she reports she had a hernia bulge yesterday.  I can feel a small hernia in the left inguinal area that is tender to palpation mildly.  Denies any leg pain but does have swelling in the legs that she says is chronic.  Denies any back pain and did not have back tenderness.  Neck had some bruising but was not focally tender.  Head did not  have any tenderness but had some mild bruising and she has the previous craniotomy deformities palpated.  Pupils symmetric and reactive with normal extract movements.  Patient was warm to the touch, will get rectal temp.  Clinically I am somewhat concerned about the patient's hypoxia, trauma, and her  warmth to palpation and cough.  Will get labs, will get rectal temp, will get COVID swab, but will also get imaging to look for traumatic injuries in the head, neck, and chest/abdomen/pelvis.  Given the patient's hypoxia today and multiple falls, anticipate admission after workup is completed.  2:16 PM CT scans returned and she does not have evidence of intracranial or intrathoracic trauma however she does have a bowel obstruction with a loop of bowel in the left inguinal hernia.  I went to reassess the patient and now she is quite tender in the left inguinal area over this hernia.  I tried to reduce it and press on it but she did not tolerate this.  Will order pain medicine and call general surgery.  Due to her hypoxia, bowel obstruction, and hernia that we are not having success with reduction in the emergency department, anticipate admission to medicine with general surgery following.  Will keep her n.p.o. at this time.  2:33 PM Spoke to Dr. Maisie Fus with general surgery.  She will look at his CT scan and request that we try some pain medicine, muscle relaxing medication, and ice pack to see if we can reduce at the bedside.  If not, she will likely need to go to the OR.  Will give some fentanyl, Valium to avoid oral intake with muscle relaxant, and ice pack and reassess shortly.  2:57 PM Gave a small dose of Valium and some pain medicine and attempted reduction.  The size of the hernia is much improved with there are still small bump that I cannot reduce successfully.  Will let general surgery know.  Patient also became hypoxic after the medications and was on nonrebreather, will de-escalate as she wakes  up more.  She is answering questions appropriately.        Final Clinical Impression(s) / ED Diagnoses Final diagnoses:  Fall, initial encounter  SBO (small bowel obstruction) (HCC)  Hernia, inguinal, left  Hypoxia    Clinical Impression: 1. Fall, initial encounter   2. SBO (small bowel obstruction) (HCC)   3. Hernia, inguinal, left   4. Hypoxia     Disposition: Admit  This note was prepared with assistance of Dragon voice recognition software. Occasional wrong-word or sound-a-like substitutions may have occurred due to the inherent limitations of voice recognition software.     Dicie Edelen, Canary Brim, MD 04/09/23 1536

## 2023-04-09 NOTE — Consult Note (Signed)
CC: SBO  Requesting provider: Dr Rush Landmark  HPI: Kayla Hurley is an 88 y.o. female who is here for for a fall and nausea and vomiting.  She states she has had a L Inguinal hernia for several months.  She usually is able to lie down and reduce it but wasn't able to get to a place when she could do this last night and then woke up in severe pain, which was followed by nausea and vomiting.  Then she developed weakness, which led to her fall. She was brought in by EMS.  CT scan of head and neck were neg for traumatic injury.  CT abd and pelvis showed SBO due to L inguinal hernia.  She states that she underwent bilateral inguinal hernia repair in the late 80's. Hernia reduced by EDP  Past Medical History:  Diagnosis Date   COPD (chronic obstructive pulmonary disease) (HCC)    Glaucoma    Hypertensive retinopathy    Macular degeneration    Meningioma (HCC)    Rheumatoid arthritis (HCC)     Past Surgical History:  Procedure Laterality Date   APPENDECTOMY     CATARACT EXTRACTION     CESAREAN SECTION     x 3   CHOLECYSTECTOMY     CRANIECTOMY / CRANIOTOMY FOR EXCISION OF BRAIN TUMOR     meningioma   EYE SURGERY     HYSTEROSCOPY WITH D & C     INGUINAL HERNIA REPAIR     YAG LASER APPLICATION      Family History  Problem Relation Age of Onset   Other Mother        died during open heart surgery   Heart disease Father    Lung cancer Maternal Grandfather        smoker   Diabetes Son     Social:  reports that she has quit smoking. Her smoking use included cigarettes. She has a 50 pack-year smoking history. She has never used smokeless tobacco. She reports that she does not currently use alcohol. She reports that she does not use drugs.  Allergies:  Allergies  Allergen Reactions   Adhesive [Tape] Other (See Comments)    Blisters    Cucumber Extract Other (See Comments)    Exact allergic reaction not cited   Lactose Intolerance (Gi) Diarrhea   Penicillins Diarrhea, Nausea  And Vomiting and Rash    Medications: I have reviewed the patient's current medications.  Results for orders placed or performed during the hospital encounter of 04/09/23 (from the past 48 hour(s))  Resp panel by RT-PCR (RSV, Flu A&B, Covid) Anterior Nasal Swab     Status: None   Collection Time: 04/09/23 10:12 AM   Specimen: Anterior Nasal Swab  Result Value Ref Range   SARS Coronavirus 2 by RT PCR NEGATIVE NEGATIVE    Comment: (NOTE) SARS-CoV-2 target nucleic acids are NOT DETECTED.  The SARS-CoV-2 RNA is generally detectable in upper respiratory specimens during the acute phase of infection. The lowest concentration of SARS-CoV-2 viral copies this assay can detect is 138 copies/mL. A negative result does not preclude SARS-Cov-2 infection and should not be used as the sole basis for treatment or other patient management decisions. A negative result may occur with  improper specimen collection/handling, submission of specimen other than nasopharyngeal swab, presence of viral mutation(s) within the areas targeted by this assay, and inadequate number of viral copies(<138 copies/mL). A negative result must be combined with clinical observations, patient history, and epidemiological  information. The expected result is Negative.  Fact Sheet for Patients:  BloggerCourse.com  Fact Sheet for Healthcare Providers:  SeriousBroker.it  This test is no t yet approved or cleared by the [place] FDA and  has been authorized for detection and/or diagnosis of SARS-CoV-2 by FDA under an Emergency Use Authorization (EUA). This EUA will remain  in effect (meaning this test can be used) for the duration of the COVID-19 declaration under Section 564(b)(1) of the Act, 21 U.S.C.section 360bbb-3(b)(1), unless the authorization is terminated  or revoked sooner.       Influenza A by PCR NEGATIVE NEGATIVE   Influenza B by PCR NEGATIVE NEGATIVE     Comment: (NOTE) The Xpert Xpress SARS-CoV-2/FLU/RSV plus assay is intended as an aid in the diagnosis of influenza from Nasopharyngeal swab specimens and should not be used as a sole basis for treatment. Nasal washings and aspirates are unacceptable for Xpert Xpress SARS-CoV-2/FLU/RSV testing.  Fact Sheet for Patients: BloggerCourse.com  Fact Sheet for Healthcare Providers: SeriousBroker.it  This test is not yet approved or cleared by the [place] FDA and has been authorized for detection and/or diagnosis of SARS-CoV-2 by FDA under an Emergency Use Authorization (EUA). This EUA will remain in effect (meaning this test can be used) for the duration of the COVID-19 declaration under Section 564(b)(1) of the Act, 21 U.S.C. section 360bbb-3(b)(1), unless the authorization is terminated or revoked.     Resp Syncytial Virus by PCR NEGATIVE NEGATIVE    Comment: (NOTE) Fact Sheet for Patients: BloggerCourse.com  Fact Sheet for Healthcare Providers: SeriousBroker.it  This test is not yet approved or cleared by the [place] FDA and has been authorized for detection and/or diagnosis of SARS-CoV-2 by FDA under an Emergency Use Authorization (EUA). This EUA will remain in effect (meaning this test can be used) for the duration of the COVID-19 declaration under Section 564(b)(1) of the Act, 21 U.S.C. section 360bbb-3(b)(1), unless the authorization is terminated or revoked.  Performed at [facility], [address], [place], [place] [address]   CBC with Differential     Status: Abnormal   Collection Time: 04/09/23 10:20 AM  Result Value Ref Range   WBC 11.4 (H) 4.0 - 10.5 K/uL   RBC 4.20 3.87 - 5.11 MIL/uL   Hemoglobin 13.6 12.0 - 15.0 g/dL   HCT 25.9 56.3 - 87.5 %   MCV 93.6 80.0 - 100.0 fL   MCH 32.4 26.0 - 34.0 pg   MCHC 34.6 30.0 - 36.0 g/dL   RDW  64.3 (H) 32.9 - 15.5 %   Platelets 127 (L) 150 - 400 K/uL   nRBC 0.0 0.0 - 0.2 %   Neutrophils Relative % 82 %   Neutro Abs 9.2 (H) 1.7 - 7.7 K/uL   Lymphocytes Relative 4 %   Lymphs Abs 0.5 (L) 0.7 - 4.0 K/uL   Monocytes Relative 13 %   Monocytes Absolute 1.5 (H) 0.1 - 1.0 K/uL   Eosinophils Relative 0 %   Eosinophils Absolute 0.0 0.0 - 0.5 K/uL   Basophils Relative 0 %   Basophils Absolute 0.0 0.0 - 0.1 K/uL   Immature Granulocytes 1 %   Abs Immature Granulocytes 0.11 (H) 0.00 - 0.07 K/uL    Comment: Performed at [facility], [address] [place], [place] [address]  Comprehensive metabolic panel     Status: Abnormal   Collection Time: 04/09/23 10:20 AM  Result Value Ref Range   Sodium 139 135 -  145 mmol/L   Potassium 3.2 (L) 3.5 - 5.1 mmol/L   Chloride 93 (L) 98 - 111 mmol/L   CO2 31 22 - 32 mmol/L   Glucose, Bld 137 (H) 70 - 99 mg/dL    Comment: Glucose reference range applies only to samples taken after fasting for at least 8 hours.   BUN 22 8 - 23 mg/dL   Creatinine, Ser 0.63 0.44 - 1.00 mg/dL   Calcium 9.9 8.9 - 01.6 mg/dL   Total Protein 6.5 6.5 - 8.1 g/dL   Albumin 3.4 (L) 3.5 - 5.0 g/dL   AST 26 15 - 41 U/L   ALT 14 0 - 44 U/L   Alkaline Phosphatase 45 38 - 126 U/L   Total Bilirubin 1.0 0.3 - 1.2 mg/dL   GFR, Estimated >01 >09 mL/min    Comment: (NOTE) Calculated using the CKD-EPI Creatinine Equation (2021)    Anion gap 15 5 - 15    Comment: Performed at [facility], [address] [place], [place] [address]  Lactic acid, plasma     Status: None   Collection Time: 04/09/23 10:20 AM  Result Value Ref Range   Lactic Acid, Venous 1.1 0.5 - 1.9 mmol/L    Comment: Performed at [facility], [address] [place], [place] [address]  Lipase, blood     Status: None   Collection Time: 04/09/23 10:20 AM  Result Value Ref Range   Lipase 47 11 - 51 U/L    Comment: Performed at [facility], [address] [place], [place] [address]  Urinalysis, w/ Reflex to Culture (Infection Suspected) -Urine, Clean Catch     Status: Abnormal   Collection Time: 04/09/23 12:30 PM  Result Value Ref Range   Specimen Source URINE, CLEAN CATCH    Color, Urine YELLOW YELLOW   APPearance CLEAR CLEAR   Specific Gravity, Urine 1.033 (H) 1.005 - 1.030   pH 5.0 5.0 - 8.0   Glucose, UA NEGATIVE NEGATIVE mg/dL   Hgb urine dipstick NEGATIVE NEGATIVE   Bilirubin Urine NEGATIVE NEGATIVE   Ketones, ur 5 (A) NEGATIVE mg/dL   Protein, ur 30 (A) NEGATIVE mg/dL   Nitrite NEGATIVE NEGATIVE   Leukocytes,Ua NEGATIVE NEGATIVE   RBC / HPF 0-5 0 - 5 RBC/hpf   WBC, UA 0-5 0 - 5 WBC/hpf    Comment:        Reflex urine culture not performed if WBC <=10, OR if Squamous epithelial cells >5. If Squamous epithelial cells >5 suggest recollection.    Bacteria, UA RARE (A) NONE SEEN   Squamous Epithelial / HPF 0-5 0 - 5 /HPF   Mucus PRESENT    Hyaline Casts, UA PRESENT     Comment: Performed at [facility], [address], [place], [place] [address]  Lactic acid, plasma     Status: None   Collection Time: 04/09/23  2:30 PM  Result Value Ref Range   Lactic Acid, Venous 1.0 0.5 - 1.9 mmol/L    Comment: Performed at [facility], [address] [place], [place] [address]    CT Cervical Spine Wo Contrast  Result Date: 04/09/2023 CLINICAL DATA:  Poly trauma, blunt.  Multiple falls. EXAM: CT CERVICAL SPINE WITHOUT CONTRAST TECHNIQUE: Multidetector CT imaging of the cervical spine was performed without intravenous contrast. Multiplanar CT image reconstructions were also generated. RADIATION DOSE REDUCTION: This exam was performed according to the departmental dose-optimization program which includes automated exposure  control, adjustment of the mA and/or kV according to patient size and/or use of iterative reconstruction technique. COMPARISON:  CT cervical spine 09/06/2022  FINDINGS: Alignment: Straightening without focal angulation or listhesis. Skull base and vertebrae: No evidence of acute cervical spine fracture or traumatic subluxation. Multilevel spondylosis, similar to previous study. Soft tissues and spinal canal: No prevertebral fluid or swelling. No visible canal hematoma. Disc levels: No evidence of large disc herniation or high-grade spinal stenosis. There is multilevel spondylosis with disc space narrowing, endplate osteophytes and facet hypertrophy at multiple levels. There is evidence for mild chronic osseous spinal stenosis, greatest at C5-6. Mild foraminal narrowing is present at multiple levels. Upper chest: Stable mild scarring at both lung apices. Other: Bilateral carotid atherosclerosis. IMPRESSION: 1. No evidence of acute cervical spine fracture, traumatic subluxation or static signs of instability. 2. Multilevel cervical spondylosis, similar to previous study. Electronically Signed   By: Carey Bullocks M.D.   On: 04/09/2023 12:31   CT HEAD WO CONTRAST ( )  Result Date: 04/09/2023 CLINICAL DATA:  Multiple falls with bruising on head and neck. History of meningioma resection. EXAM: CT HEAD WITHOUT CONTRAST TECHNIQUE: Contiguous axial images were obtained from the base of the skull through the vertex without intravenous contrast. RADIATION DOSE REDUCTION: This exam was performed according to the departmental dose-optimization program which includes automated exposure control, adjustment of the mA and/or kV according to patient size and/or use of iterative reconstruction technique. COMPARISON:  09/08/2022 FINDINGS: Brain: There is no evidence for acute hemorrhage, hydrocephalus, mass lesion, or abnormal extra-axial fluid collection. No definite CT evidence for acute infarction. Patchy low attenuation in the deep hemispheric and periventricular white matter is nonspecific, but likely reflects chronic microvascular ischemic demyelination. High right frontal  encephalomalacia underlies a craniotomy defect and is stable in the interval. Vascular: No hyperdense vessel or unexpected calcification. Skull: No evidence for fracture. No worrisome lytic or sclerotic lesion. Frontal craniotomy defect noted. Sinuses/Orbits: The visualized paranasal sinuses and mastoid air cells are clear. Visualized portions of the globes and intraorbital fat are unremarkable. Other: None. IMPRESSION: 1. No acute intracranial abnormality. 2. Stable high right frontal encephalomalacia underlying a craniotomy defect. 3. Stable background chronic small vessel ischemic disease. Electronically Signed   By: Kennith Center M.D.   On: 04/09/2023 12:24   CT CHEST ABDOMEN PELVIS W CONTRAST  Result Date: 04/09/2023 CLINICAL DATA:  Multiple falls. Blunt poly trauma. Chest and abdominal pain. EXAM: CT CHEST, ABDOMEN, AND PELVIS WITH CONTRAST TECHNIQUE: Multidetector CT imaging of the chest, abdomen and pelvis was performed following the standard protocol during bolus administration of intravenous contrast. RADIATION DOSE REDUCTION: This exam was performed according to the departmental dose-optimization program which includes automated exposure control, adjustment of the mA and/or kV according to patient size and/or use of iterative reconstruction technique. CONTRAST:  OMNIPAQUE IOHEXOL 300 MG/ML  SOLN COMPARISON:  None Available. FINDINGS: CT CHEST FINDINGS Cardiovascular: No evidence of thoracic aortic injury or mediastinal hematoma. No pericardial effusion. Mediastinum/Nodes: No evidence of hemorrhage or pneumomediastinum. No masses or pathologically enlarged lymph nodes identified. Mild diffuse esophageal wall thickening, suspicious for esophagitis. Lungs/Pleura: No evidence of pulmonary contusion or acute infiltrate. No evidence of pneumothorax or hemothorax. Mild chronic interstitial lung disease is seen in subpleural regions. Musculoskeletal: No acute fractures or suspicious bone lesions  identified. CT ABDOMEN PELVIS FINDINGS Hepatobiliary: No hepatic laceration or mass identified. Prior cholecystectomy. No evidence of biliary obstruction. Pancreas: No parenchymal laceration, mass, or inflammatory changes identified. Spleen: No evidence of  splenic laceration. Adrenal/Urinary Tract: No hemorrhage or parenchymal lacerations identified. No evidence of suspicious masses or hydronephrosis. Stomach/Bowel: Diffuse colonic diverticulosis is seen, without signs of diverticulitis. Mild ascites is seen. Moderately dilated small bowel loops are seen throughout the abdomen and pelvis, with transition point at the site of a small left inguinal hernia containing a single small bowel loop. No evidence of pneumatosis or free intraperitoneal air. Vascular/Lymphatic: No evidence of abdominal aortic injury or retroperitoneal hemorrhage. No pathologically enlarged lymph nodes identified. Reproductive:  No mass or other significant abnormality identified. Other:  None. Musculoskeletal: No acute fractures or suspicious bone lesions identified. IMPRESSION: No evidence of traumatic injury involving the chest, abdomen, or pelvis. Small bowel obstruction due to small left inguinal hernia containing a single small bowel loop. Mild ascites. Colonic diverticulosis, without radiographic evidence of diverticulitis. Mild diffuse esophageal wall thickening, suspicious for esophagitis. Chronic interstitial lung disease. Electronically Signed   By: Danae Orleans M.D.   On: 04/09/2023 12:22    ROS - all of the below systems have been reviewed with the patient and positives are indicated with bold text General: chills, fever or night sweats Eyes: blurry vision or double vision ENT: epistaxis or sore throat Hematologic/Lymphatic: bleeding problems, blood clots or swollen lymph nodes Endocrine: temperature intolerance or unexpected weight changes Resp: cough, shortness of breath, or wheezing CV: chest pain or dyspnea on  exertion GI: as per HPI GU: dysuria, trouble voiding, or hematuria Neuro: TIA or stroke symptoms    PE Blood pressure 137/85, pulse 93, temperature 99.2 F (37.3 C), resp. rate 16, height 5\' 1"  (1.549 m), weight 59 kg, SpO2 98%. Constitutional: NAD; conversant; no deformities Eyes: Moist conjunctiva; no lid lag; anicteric; PERRL Neck: Trachea midline; no thyromegaly Lungs: Normal respiratory effort CV: RRR GI: Abd soft, non-distended, L ing hernia reduced MSK: Normal range of motion of extremities; no clubbing/cyanosis, multiple bruises on arms and chest Psychiatric: Appropriate affect; alert and oriented x3  Results for orders placed or performed during the hospital encounter of 04/09/23 (from the past 48 hour(s))  Resp panel by RT-PCR (RSV, Flu A&B, Covid) Anterior Nasal Swab     Status: None   Collection Time: 04/09/23 10:12 AM   Specimen: Anterior Nasal Swab  Result Value Ref Range   SARS Coronavirus 2 by RT PCR NEGATIVE NEGATIVE    Comment: (NOTE) SARS-CoV-2 target nucleic acids are NOT DETECTED.  The SARS-CoV-2 RNA is generally detectable in upper respiratory specimens during the acute phase of infection. The lowest concentration of SARS-CoV-2 viral copies this assay can detect is 138 copies/mL. A negative result does not preclude SARS-Cov-2 infection and should not be used as the sole basis for treatment or other patient management decisions. A negative result may occur with  improper specimen collection/handling, submission of specimen other than nasopharyngeal swab, presence of viral mutation(s) within the areas targeted by this assay, and inadequate number of viral copies(<138 copies/mL). A negative result must be combined with clinical observations, patient history, and epidemiological information. The expected result is Negative.  Fact Sheet for Patients:  BloggerCourse.com  Fact Sheet for Healthcare Providers:   SeriousBroker.it  This test is no t yet approved or cleared by the [place] FDA and  has been authorized for detection and/or diagnosis of SARS-CoV-2 by FDA under an Emergency Use Authorization (EUA). This EUA will remain  in effect (meaning this test can be used) for the duration of the COVID-19 declaration under Section 564(b)(1) of the Act, 21 U.S.C.section 360bbb-3(b)(1), unless  the authorization is terminated  or revoked sooner.       Influenza A by PCR NEGATIVE NEGATIVE   Influenza B by PCR NEGATIVE NEGATIVE    Comment: (NOTE) The Xpert Xpress SARS-CoV-2/FLU/RSV plus assay is intended as an aid in the diagnosis of influenza from Nasopharyngeal swab specimens and should not be used as a sole basis for treatment. Nasal washings and aspirates are unacceptable for Xpert Xpress SARS-CoV-2/FLU/RSV testing.  Fact Sheet for Patients: BloggerCourse.com  Fact Sheet for Healthcare Providers: SeriousBroker.it  This test is not yet approved or cleared by the [place] FDA and has been authorized for detection and/or diagnosis of SARS-CoV-2 by FDA under an Emergency Use Authorization (EUA). This EUA will remain in effect (meaning this test can be used) for the duration of the COVID-19 declaration under Section 564(b)(1) of the Act, 21 U.S.C. section 360bbb-3(b)(1), unless the authorization is terminated or revoked.     Resp Syncytial Virus by PCR NEGATIVE NEGATIVE    Comment: (NOTE) Fact Sheet for Patients: BloggerCourse.com  Fact Sheet for Healthcare Providers: SeriousBroker.it  This test is not yet approved or cleared by the [place] FDA and has been authorized for detection and/or diagnosis of SARS-CoV-2 by FDA under an Emergency Use Authorization (EUA). This EUA will remain in effect (meaning this test can be used) for the duration of  the COVID-19 declaration under Section 564(b)(1) of the Act, 21 U.S.C. section 360bbb-3(b)(1), unless the authorization is terminated or revoked.  Performed at [facility], [address], [place], [place] [address]   CBC with Differential     Status: Abnormal   Collection Time: 04/09/23 10:20 AM  Result Value Ref Range   WBC 11.4 (H) 4.0 - 10.5 K/uL   RBC 4.20 3.87 - 5.11 MIL/uL   Hemoglobin 13.6 12.0 - 15.0 g/dL   HCT 60.4 54.0 - 98.1 %   MCV 93.6 80.0 - 100.0 fL   MCH 32.4 26.0 - 34.0 pg   MCHC 34.6 30.0 - 36.0 g/dL   RDW 19.1 (H) 47.8 - 29.5 %   Platelets 127 (L) 150 - 400 K/uL   nRBC 0.0 0.0 - 0.2 %   Neutrophils Relative % 82 %   Neutro Abs 9.2 (H) 1.7 - 7.7 K/uL   Lymphocytes Relative 4 %   Lymphs Abs 0.5 (L) 0.7 - 4.0 K/uL   Monocytes Relative 13 %   Monocytes Absolute 1.5 (H) 0.1 - 1.0 K/uL   Eosinophils Relative 0 %   Eosinophils Absolute 0.0 0.0 - 0.5 K/uL   Basophils Relative 0 %   Basophils Absolute 0.0 0.0 - 0.1 K/uL   Immature Granulocytes 1 %   Abs Immature Granulocytes 0.11 (H) 0.00 - 0.07 K/uL    Comment: Performed at [facility], [address], [place], [place] [address]  Comprehensive metabolic panel     Status: Abnormal   Collection Time: 04/09/23 10:20 AM  Result Value Ref Range   Sodium 139 135 - 145 mmol/L   Potassium 3.2 (L) 3.5 - 5.1 mmol/L   Chloride 93 (L) 98 - 111 mmol/L   CO2 31 22 - 32 mmol/L   Glucose, Bld 137 (H) 70 - 99 mg/dL    Comment: Glucose reference range applies only to samples taken after fasting for at least 8 hours.   BUN 22 8 - 23 mg/dL   Creatinine, Ser 8.65 0.44 - 1.00 mg/dL   Calcium 9.9 8.9 - 78.4 mg/dL   Total Protein  6.5 6.5 - 8.1 g/dL   Albumin 3.4 (L) 3.5 - 5.0 g/dL   AST 26 15 - 41 U/L   ALT 14 0 - 44 U/L   Alkaline Phosphatase 45 38 - 126 U/L   Total Bilirubin 1.0 0.3 - 1.2 mg/dL   GFR, Estimated >16 >10 mL/min    Comment: (NOTE) Calculated using the CKD-EPI Creatinine  Equation (2021)    Anion gap 15 5 - 15    Comment: Performed at [facility], [address], [place], [place] [address]  Lactic acid, plasma     Status: None   Collection Time: 04/09/23 10:20 AM  Result Value Ref Range   Lactic Acid, Venous 1.1 0.5 - 1.9 mmol/L    Comment: Performed at [facility], [address] [place], [place] [address]  Lipase, blood     Status: None   Collection Time: 04/09/23 10:20 AM  Result Value Ref Range   Lipase 47 11 - 51 U/L    Comment: Performed at [facility], [address], [place], [place] [address]  Urinalysis, w/ Reflex to Culture (Infection Suspected) -Urine, Clean Catch     Status: Abnormal   Collection Time: 04/09/23 12:30 PM  Result Value Ref Range   Specimen Source URINE, CLEAN CATCH    Color, Urine YELLOW YELLOW   APPearance CLEAR CLEAR   Specific Gravity, Urine 1.033 (H) 1.005 - 1.030   pH 5.0 5.0 - 8.0   Glucose, UA NEGATIVE NEGATIVE mg/dL   Hgb urine dipstick NEGATIVE NEGATIVE   Bilirubin Urine NEGATIVE NEGATIVE   Ketones, ur 5 (A) NEGATIVE mg/dL   Protein, ur 30 (A) NEGATIVE mg/dL   Nitrite NEGATIVE NEGATIVE   Leukocytes,Ua NEGATIVE NEGATIVE   RBC / HPF 0-5 0 - 5 RBC/hpf   WBC, UA 0-5 0 - 5 WBC/hpf    Comment:        Reflex urine culture not performed if WBC <=10, OR if Squamous epithelial cells >5. If Squamous epithelial cells >5 suggest recollection.    Bacteria, UA RARE (A) NONE SEEN   Squamous Epithelial / HPF 0-5 0 - 5 /HPF   Mucus PRESENT    Hyaline Casts, UA PRESENT     Comment: Performed at [facility], [address], [place], [place] [address]  Lactic acid, plasma     Status: None   Collection Time: 04/09/23  2:30 PM  Result Value Ref Range   Lactic Acid, Venous 1.0 0.5 - 1.9 mmol/L    Comment: Performed at [facility], [address], [place], [place] [address]    CT Cervical Spine Wo Contrast  Result Date:  04/09/2023 CLINICAL DATA:  Poly trauma, blunt.  Multiple falls. EXAM: CT CERVICAL SPINE WITHOUT CONTRAST TECHNIQUE: Multidetector CT imaging of the cervical spine was performed without intravenous contrast. Multiplanar CT image reconstructions were also generated. RADIATION DOSE REDUCTION: This exam was performed according to the departmental dose-optimization program which includes automated exposure control, adjustment of the mA and/or kV according to patient size and/or use of iterative reconstruction technique. COMPARISON:  CT cervical spine 09/06/2022 FINDINGS: Alignment: Straightening without focal angulation or listhesis. Skull base and vertebrae: No evidence of acute cervical spine fracture or traumatic subluxation. Multilevel spondylosis, similar to previous study. Soft tissues and spinal canal: No prevertebral fluid or swelling. No visible canal hematoma. Disc levels: No evidence of large disc herniation or high-grade spinal stenosis. There is multilevel spondylosis with disc space narrowing,  endplate osteophytes and facet hypertrophy at multiple levels. There is evidence for mild chronic osseous spinal stenosis, greatest at C5-6. Mild foraminal narrowing is present at multiple levels. Upper chest: Stable mild scarring at both lung apices. Other: Bilateral carotid atherosclerosis. IMPRESSION: 1. No evidence of acute cervical spine fracture, traumatic subluxation or static signs of instability. 2. Multilevel cervical spondylosis, similar to previous study. Electronically Signed   By: Carey Bullocks M.D.   On: 04/09/2023 12:31   CT HEAD WO CONTRAST ( )  Result Date: 04/09/2023 CLINICAL DATA:  Multiple falls with bruising on head and neck. History of meningioma resection. EXAM: CT HEAD WITHOUT CONTRAST TECHNIQUE: Contiguous axial images were obtained from the base of the skull through the vertex without intravenous contrast. RADIATION DOSE REDUCTION: This exam was performed according to the  departmental dose-optimization program which includes automated exposure control, adjustment of the mA and/or kV according to patient size and/or use of iterative reconstruction technique. COMPARISON:  09/08/2022 FINDINGS: Brain: There is no evidence for acute hemorrhage, hydrocephalus, mass lesion, or abnormal extra-axial fluid collection. No definite CT evidence for acute infarction. Patchy low attenuation in the deep hemispheric and periventricular white matter is nonspecific, but likely reflects chronic microvascular ischemic demyelination. High right frontal encephalomalacia underlies a craniotomy defect and is stable in the interval. Vascular: No hyperdense vessel or unexpected calcification. Skull: No evidence for fracture. No worrisome lytic or sclerotic lesion. Frontal craniotomy defect noted. Sinuses/Orbits: The visualized paranasal sinuses and mastoid air cells are clear. Visualized portions of the globes and intraorbital fat are unremarkable. Other: None. IMPRESSION: 1. No acute intracranial abnormality. 2. Stable high right frontal encephalomalacia underlying a craniotomy defect. 3. Stable background chronic small vessel ischemic disease. Electronically Signed   By: Kennith Center M.D.   On: 04/09/2023 12:24   CT CHEST ABDOMEN PELVIS W CONTRAST  Result Date: 04/09/2023 CLINICAL DATA:  Multiple falls. Blunt poly trauma. Chest and abdominal pain. EXAM: CT CHEST, ABDOMEN, AND PELVIS WITH CONTRAST TECHNIQUE: Multidetector CT imaging of the chest, abdomen and pelvis was performed following the standard protocol during bolus administration of intravenous contrast. RADIATION DOSE REDUCTION: This exam was performed according to the departmental dose-optimization program which includes automated exposure control, adjustment of the mA and/or kV according to patient size and/or use of iterative reconstruction technique. CONTRAST:  OMNIPAQUE IOHEXOL 300 MG/ML  SOLN COMPARISON:  None Available. FINDINGS: CT  CHEST FINDINGS Cardiovascular: No evidence of thoracic aortic injury or mediastinal hematoma. No pericardial effusion. Mediastinum/Nodes: No evidence of hemorrhage or pneumomediastinum. No masses or pathologically enlarged lymph nodes identified. Mild diffuse esophageal wall thickening, suspicious for esophagitis. Lungs/Pleura: No evidence of pulmonary contusion or acute infiltrate. No evidence of pneumothorax or hemothorax. Mild chronic interstitial lung disease is seen in subpleural regions. Musculoskeletal: No acute fractures or suspicious bone lesions identified. CT ABDOMEN PELVIS FINDINGS Hepatobiliary: No hepatic laceration or mass identified. Prior cholecystectomy. No evidence of biliary obstruction. Pancreas: No parenchymal laceration, mass, or inflammatory changes identified. Spleen: No evidence of splenic laceration. Adrenal/Urinary Tract: No hemorrhage or parenchymal lacerations identified. No evidence of suspicious masses or hydronephrosis. Stomach/Bowel: Diffuse colonic diverticulosis is seen, without signs of diverticulitis. Mild ascites is seen. Moderately dilated small bowel loops are seen throughout the abdomen and pelvis, with transition point at the site of a small left inguinal hernia containing a single small bowel loop. No evidence of pneumatosis or free intraperitoneal air. Vascular/Lymphatic: No evidence of abdominal aortic injury or retroperitoneal hemorrhage. No pathologically enlarged lymph nodes identified. Reproductive:  No mass or other significant abnormality identified. Other:  None. Musculoskeletal: No acute fractures or suspicious bone lesions identified. IMPRESSION: No evidence of traumatic injury involving the chest, abdomen, or pelvis. Small bowel obstruction due to small left inguinal hernia containing a single small bowel loop. Mild ascites. Colonic diverticulosis, without radiographic evidence of diverticulitis. Mild diffuse esophageal wall thickening, suspicious for  esophagitis. Chronic interstitial lung disease. Electronically Signed   By: Danae Orleans M.D.   On: 04/09/2023 12:22     A/P: GAYNA BRADDY is an 88 y.o. female with incarcerated inguinal hernia able to be reduced by EDP with sedation.  Pt being admitted for hydration and evaluation of new onset weakness.   Hernia reduced currently.  Will keep NPO tonight and re-evaluate in AM   Vanita Panda, MD  Colorectal and General Surgery [facility]    moderate decision making.

## 2023-04-09 NOTE — H&P (Signed)
History and Physical    Patient: Kayla Hurley FAO:[id number] DOB: [birth year] DOA: 04/09/2023 DOS: the patient was seen and examined on 04/09/2023 PCP: Pcp, No  Patient coming from: Home  Chief Complaint:  Chief Complaint  Patient presents with   Fall    Pt presents by ems, post fall x2 since last night, contusion on chest, scattered abrasions of various ages, pt denies any complaints at this time. States has been feeling unwell the past few days states "I either have the flu or  last time I felt this way my potassium was low"    HPI: [name] is a 88 y.o. female with medical history significant of COPD, COVID-19, glaucoma, hypertensive retinopathy, macular degeneration, meningioma, rheumatoid arthritis, essential hypertension, hyperlipidemia, essential tremor, hypothyroidism, osteoarthritis, rheumatoid arthritis, thrombocytopenia who had 2 falls at home last night resulting in a contusion on her chest and multiple abrasions and ecchymosis on her arms.  She denied any prodromal symptoms prior to the falls, but does not have full recollection of the events.  She stated she felt like she is having influenza and hypokalemia.  She has also been having left lower quadrant pain due to a chronic left inguinal hernia that she self reduces from time to time. No melena or hematochezia.  However, since last night she has had increased pain in the area with multiple episodes of emesis. She was found to be hypoxic in the 80s by EMS and endorses a dry cough. He denied fever, chills, rhinorrhea, sore throat, wheezing or hemoptysis.  No chest pain, palpitations, diaphoresis, PND, orthopnea or pitting edema of the lower extremities.   No flank pain, dysuria, frequency or hematuria.  No polyuria, polydipsia, polyphagia or blurred vision.   Lab work: Urinalysis shows specific gravity of 1.033, ketones of 5 and protein of 30 mg/deciliter.  There was rare bacteria microscopic examination.  CBC white count of 11.4,  hemoglobin 13.6 g/dL platelets 696.  Lipase and lactic acid were normal.  Negative coronavirus, influenza and RSV PCR.  CMP showed a glucose 137 mg deciliter, albumin 3.4 g deciliter, potassium 3.2 and chloride 93 mmol/L, the rest of the CMP measurements were normal.  Imaging: CT chest/abdomen/pelvis with contrast no evidence of traumatic injury involving the chest, abdomen or pelvis.  There was a small bowel obstruction due to small left inguinal hernia containing a single small bowel loop.  Mild ascites.  Colonic diverticulosis, without radiographic evidence of diverticulitis.  Mild diffuse esophageal wall thickening suspicious for esophagitis.  Chron interstitial lung disease.  CT cervical spine with no evidence of acute cervical spine fracture, traumatic subluxation or static signs of instability.  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  CT head without contrast no acute intracranial normality.  Stable high right frontal encephalomalacia and underlying craniotomy defect.  Stable background chronic small vessel ischemic disease.   ED course: Initial vital signs were temperature 99.8 F, pulse 71, respiration 13, BP 126/55 mmHg O2 sat 91% on unspecified FiO2.  Patient received Valium 5 mg IVP, fentanyl 50 mcg IVP.  Dr. Rush Landmark tried to reduce the inguinal hernia after discussing with general surgery, but the patient became hypoxic temporarily.  Dr. Maisie Fus from [facility] was subsequently able to reduce the hernia.  We are admitting the patient for medical management.  Review of Systems: As mentioned in the history of present illness. All other systems reviewed and are negative.  Past Medical History:  Diagnosis Date   COPD (chronic obstructive pulmonary  disease) (HCC)    Glaucoma    Hypertensive retinopathy    Macular degeneration    Meningioma (HCC)    Rheumatoid arthritis (HCC)    Past Surgical History:  Procedure Laterality Date   APPENDECTOMY      CATARACT EXTRACTION     CESAREAN SECTION     x 3   CHOLECYSTECTOMY     CRANIECTOMY / CRANIOTOMY FOR EXCISION OF BRAIN TUMOR     meningioma   EYE SURGERY     HYSTEROSCOPY WITH D & C     INGUINAL HERNIA REPAIR     YAG LASER APPLICATION     Social History:  reports that she has quit smoking. Her smoking use included cigarettes. She has a 50 pack-year smoking history. She has never used smokeless tobacco. She reports that she does not currently use alcohol. She reports that she does not use drugs.  Allergies  Allergen Reactions   Adhesive [Tape] Other (See Comments)    Blisters    Cucumber Extract Other (See Comments)    Exact allergic reaction not cited   Lactose Intolerance (Gi) Diarrhea   Penicillins Diarrhea, Nausea And Vomiting and Rash    Family History  Problem Relation Age of Onset   Other Mother        died during open heart surgery   Heart disease Father    Lung cancer Maternal Grandfather        smoker   Diabetes Son     Prior to Admission medications   Medication Sig Start Date End Date Taking? Authorizing Provider  folic acid (FOLVITE) 1 MG tablet Take 2 mg by mouth daily.   Yes [provider]  salsalate (DISALCID) 750 MG tablet Take 1,500 mg by mouth 2 (two) times daily. 09/02/19  Yes [provider]  triamterene-hydrochlorothiazide (MAXZIDE-25) 37.5-25 MG tablet Take 1 tablet by mouth daily. 10/18/22  Yes [provider]  acetaminophen (TYLENOL) 325 MG tablet Take 2 tablets (650 mg total) by mouth every 6 (six) hours as needed for mild pain (or Fever >/= 101). Patient not taking: Reported on 03/16/2023 09/07/22   Evette Georges, MD  bisacodyl (DULCOLAX) 5 MG EC tablet Take 5 mg by mouth daily as needed for moderate constipation. Patient not taking: Reported on 03/16/2023    [provider]  Calcium-Magnesium-Zinc (CAL-MAG-ZINC PO) Take 1-2 tablets by mouth daily.    [provider]  Cholecalciferol (VITAMIN D3) 125 MCG  (5000 UT) capsule Take 5,000 Units by mouth daily.    [provider]  clobetasol (TEMOVATE) 0.05 % external solution Apply 1 Application topically 2 (two) times daily as needed (as directed- to affected area(s); avoid face/groin/underarms). 08/28/22   [provider]  diclofenac Sodium (VOLTAREN) 1 % GEL Apply 2 g topically daily as needed (affected sites- for pain). 11/13/17   [provider]  fluticasone (CUTIVATE) 0.05 % cream Apply 1 Application topically 2 (two) times daily as needed (as directed- to affected area(s)). 08/29/22   [provider]  fluticasone furoate-vilanterol (BREO ELLIPTA) 200-25 MCG/ACT AEPB Inhale 1 puff into the lungs daily. 11/17/22   Luciano Cutter, MD  gabapentin (NEURONTIN) 100 MG capsule Take 1 capsule (100 mg total) by mouth at bedtime. Patient not taking: Reported on 09/10/2022 08/04/22   Tat, Octaviano Batty, DO  GAVISCON EXTRA STRENGTH 160-105 MG CHEW Chew 1 tablet by mouth every 6 (six) hours as needed (for gas).    [provider]  guaiFENesin-dextromethorphan (ROBITUSSIN DM) 100-10 MG/5ML  syrup Take 10 mLs by mouth every 4 (four) hours as needed for cough. 09/12/22   Glade Lloyd, MD  hydrochlorothiazide (HYDRODIURIL) 25 MG tablet Take 25 mg by mouth daily. 12/30/20   [provider]  ketoconazole (NIZORAL) 2 % cream Apply 1 Application topically daily as needed for irritation. 08/11/22   [provider]  leucovorin (WELLCOVORIN) 5 MG tablet Take 10 mg by mouth every Friday. 08/12/19   [provider]  levothyroxine (SYNTHROID) 50 MCG tablet Take 50 mcg by mouth daily before breakfast.    [provider]  methocarbamol (ROBAXIN) 500 MG tablet Take 1 tablet (500 mg total) by mouth every 6 (six) hours as needed for muscle spasms. Patient not taking: Reported on 03/16/2023 09/12/22   Glade Lloyd, MD  methotrexate (RHEUMATREX) 2.5 MG tablet Take 4 tablets (10 mg total) by mouth every Thursday. Resume  from next week 09/22/22   Glade Lloyd, MD  Multiple Vitamins-Minerals (PRESERVISION AREDS 2) CAPS Take 1 capsule by mouth in the morning and at bedtime.    [provider]  NON FORMULARY Place 1-2 tablets under the tongue See admin instructions. Hyland's leg cramp tablets- Dissolve 1-2 tablets under the tongue every four hours as needed for leg cramps    [provider]  potassium chloride (KLOR-CON) 10 MEQ tablet Take 10 mEq by mouth in the morning and at bedtime. 09/06/22   [provider]  primidone (MYSOLINE) 50 MG tablet TAKE 1/2 TABLET BY MOUTH AT BEDTIME 10/21/22   Tat, Octaviano Batty, DO  simethicone (MYLICON) 125 MG chewable tablet Chew 125 mg by mouth every 6 (six) hours as needed for flatulence. Patient not taking: Reported on 03/16/2023    [provider]  timolol (TIMOPTIC) 0.5 % ophthalmic solution Place 1 drop into both eyes 2 (two) times daily. 08/17/22   [provider]  traMADol (ULTRAM) 50 MG tablet Take 1 tablet (50 mg total) by mouth every 6 (six) hours as needed for moderate pain. Patient not taking: Reported on 04/09/2023 09/12/22   Glade Lloyd, MD  VENTOLIN HFA 108 (90 Base) MCG/ACT inhaler Inhale 1 puff into the lungs every 4 (four) hours as needed for shortness of breath. 09/12/22   Glade Lloyd, MD    Physical Exam: Vitals:   04/09/23 1300 04/09/23 1330 04/09/23 1400 04/09/23 1407  BP: (!) 141/70 129/66 (!) 140/69 137/85  Pulse:   91 93  Resp:    16  Temp:    99.2 F (37.3 C)  TempSrc:      SpO2:   99% 98%  Weight:      Height:       Physical Exam Vitals and nursing note reviewed.  Constitutional:      General: She is sleeping. She is not in acute distress.    Appearance: Normal appearance. She is normal weight. She is ill-appearing.     Interventions: Nasal cannula in place.  HENT:     Head: Normocephalic.     Nose: No rhinorrhea.     Mouth/Throat:     Mouth: Mucous membranes are dry.  Eyes:     General: No  scleral icterus.    Pupils: Pupils are equal, round, and reactive to light.  Neck:     Vascular: No JVD.  Cardiovascular:     Rate and Rhythm: Normal rate and regular rhythm.     Heart sounds: S1 normal and S2 normal.  Pulmonary:     Effort: Pulmonary effort is normal.  Breath sounds: Normal breath sounds.  Abdominal:     General: Bowel sounds are normal.     Palpations: Abdomen is soft.     Tenderness: There is abdominal tenderness. There is no right CVA tenderness, left CVA tenderness, guarding or rebound.     Hernia: A hernia is present. Hernia is present in the left inguinal area.     Comments: (Tenderness is minimal because she just recently got medicated with fentanyl and diazepam).  Musculoskeletal:     Cervical back: Neck supple.     Right lower leg: No edema.     Left lower leg: No edema.  Skin:    General: Skin is warm and dry.  Neurological:     General: No focal deficit present.     Mental Status: She is oriented to person, place, and time and easily aroused.  Psychiatric:        Mood and Affect: Mood normal.        Behavior: Behavior normal. Behavior is cooperative.     Chest contusion.   Left hand ecchymosis.   Right arm  Data Reviewed:  Results are pending, will review when available.  EKG: Vent. rate 77 BPM PR interval 167 ms QRS duration 115 ms QT/QTcB 453/513 ms P-R-T axes 22 -61 9 Sinus rhythm Left anterior fascicular block LVH with secondary repolarization abnormality  Assessment and Plan: Principal Problem:   Small bowel obstruction (HCC) Due to single loop incarceration in the setting of:   Left inguinal hernia The hernia has been reduced by general surgery. Inpatient/PCU. Keep NPO. Continue NTG at LIS. Continue IV fluids. Analgesics as needed. Antiemetics as needed. Pantoprazole 40 mg IVP every 24 hours. Keep electrolytes optimized. Follow-up CBC and CMP in AM. Follow-up imaging in the morning. General surgery input  appreciated.  Active Problems:   Essential tremor Resume primidone once started on diet.    COPD with asthma (HCC) Continue supplemental oxygen. Bronchodilators as needed.    Thrombocytopenia (HCC) Monitor platelet count.    Hypothyroidism Resume levothyroxine once cleared for oral intake.    Dyslipidemia Currently not on therapy. Follow-up with primary care provider.    Hypokalemia Replacing. Check phosphorus and magnesium level. Supplement magnesium.    Primary open angle glaucoma Continue timolol drops. Follow-up with ophthalmology as an outpatient.    Advance Care Planning:   Code Status: Full Code   Consults: [facility] (Dr. Romie Levee).  Family Communication:   Severity of Illness: The appropriate patient status for this patient is INPATIENT. Inpatient status is judged to be reasonable and necessary in order to provide the required intensity of service to ensure the patient's safety. The patient's presenting symptoms, physical exam findings, and initial radiographic and laboratory data in the context of their chronic comorbidities is felt to place them at high risk for further clinical deterioration. Furthermore, it is not anticipated that the patient will be medically stable for discharge from the hospital within 2 midnights of admission.   * I certify that at the point of admission it is my clinical judgment that the patient will require inpatient hospital care spanning beyond 2 midnights from the point of admission due to high intensity of service, high risk for further deterioration and high frequency of surveillance required.*  Author: Bobette Mo, MD 04/09/2023 2:47 PM  For on call review [email or web address].   This document was prepared using Dragon voice recognition software and may contain some unintended transcription errors.

## 2023-04-09 NOTE — ED Notes (Signed)
ED TO INPATIENT HANDOFF REPORT  ED Nurse Name and Phone #: Jerline Pain Name/Age/Gender Kayla Hurley 88 y.o. female Room/Bed: WA18/WA18  Code Status   Code Status: Full Code  Home/SNF/Other Home Patient oriented to: self, place, time, and situation Is this baseline? Yes   Triage Complete: Triage complete  Chief Complaint Small bowel obstruction (HCC) [K56.609]  Triage Note No notes on file   Allergies Allergies  Allergen Reactions   Adhesive [Tape] Other (See Comments)    Blisters    Cucumber Extract Other (See Comments)    Exact allergic reaction not cited   Lactose Intolerance (Gi) Diarrhea   Penicillins Diarrhea, Nausea And Vomiting and Rash    Level of Care/Admitting Diagnosis ED Disposition     ED Disposition  Admit   Condition  --   Comment  Hospital Area: [facility]  HOSPITAL [[id number]]  Level of Care: Progressive [102]  Admit to Progressive based on following criteria: MULTISYSTEM THREATS such as stable sepsis, metabolic/electrolyte imbalance with or without encephalopathy that is responding to early treatment.  May admit patient to [facility] or [facility] if equivalent level of care is available:: No  Covid Evaluation: Asymptomatic - no recent exposure (last 10 days) testing not required  Diagnosis: Small bowel obstruction [facility]) [[id number]]  Admitting Physician: Bobette Mo [[id number]]  Attending Physician: Bobette Mo [[id number]]  Certification:: I certify this patient will need inpatient services for at least 2 midnights  Expected Medical Readiness: 04/11/2023          B Medical/Surgery History Past Medical History:  Diagnosis Date   COPD (chronic obstructive pulmonary disease) (HCC)    Glaucoma    Hypertensive retinopathy    Macular degeneration    Meningioma (HCC)    Rheumatoid arthritis (HCC)    Past Surgical History:  Procedure Laterality Date   APPENDECTOMY     CATARACT EXTRACTION     CESAREAN SECTION      x 3   CHOLECYSTECTOMY     CRANIECTOMY / CRANIOTOMY FOR EXCISION OF BRAIN TUMOR     meningioma   EYE SURGERY     HYSTEROSCOPY WITH D & C     INGUINAL HERNIA REPAIR     YAG LASER APPLICATION       A IV Location/Drains/Wounds Patient Lines/Drains/Airways Status     Active Line/Drains/Airways     Name Placement date Placement time Site Days   Peripheral IV 04/09/23 20 G Anterior;Right Forearm 04/09/23  0910  Forearm  less than 1   Wound / Incision (Open or Dehisced) 09/06/22 Face Right;Upper Bruising and redness from fall on 09/04/2022 09/06/22  2100  Face  215            Intake/Output Last 24 hours No intake or output data in the 24 hours ending 04/09/23 1525  Labs/Imaging Results for orders placed or performed during the hospital encounter of 04/09/23 (from the past 48 hour(s))  Resp panel by RT-PCR (RSV, Flu A&B, Covid) Anterior Nasal Swab     Status: None   Collection Time: 04/09/23 10:12 AM   Specimen: Anterior Nasal Swab  Result Value Ref Range   SARS Coronavirus 2 by RT PCR NEGATIVE NEGATIVE    Comment: (NOTE) SARS-CoV-2 target nucleic acids are NOT DETECTED.  The SARS-CoV-2 RNA is generally detectable in upper respiratory specimens during the acute phase of infection. The lowest concentration of SARS-CoV-2 viral copies this assay can detect is 138 copies/mL. A negative result does not preclude  SARS-Cov-2 infection and should not be used as the sole basis for treatment or other patient management decisions. A negative result may occur with  improper specimen collection/handling, submission of specimen other than nasopharyngeal swab, presence of viral mutation(s) within the areas targeted by this assay, and inadequate number of viral copies(<138 copies/mL). A negative result must be combined with clinical observations, patient history, and epidemiological information. The expected result is Negative.  Fact Sheet for Patients:   BloggerCourse.com  Fact Sheet for Healthcare Providers:  SeriousBroker.it  This test is no t yet approved or cleared by the [place] FDA and  has been authorized for detection and/or diagnosis of SARS-CoV-2 by FDA under an Emergency Use Authorization (EUA). This EUA will remain  in effect (meaning this test can be used) for the duration of the COVID-19 declaration under Section 564(b)(1) of the Act, 21 U.S.C.section 360bbb-3(b)(1), unless the authorization is terminated  or revoked sooner.       Influenza A by PCR NEGATIVE NEGATIVE   Influenza B by PCR NEGATIVE NEGATIVE    Comment: (NOTE) The Xpert Xpress SARS-CoV-2/FLU/RSV plus assay is intended as an aid in the diagnosis of influenza from Nasopharyngeal swab specimens and should not be used as a sole basis for treatment. Nasal washings and aspirates are unacceptable for Xpert Xpress SARS-CoV-2/FLU/RSV testing.  Fact Sheet for Patients: BloggerCourse.com  Fact Sheet for Healthcare Providers: SeriousBroker.it  This test is not yet approved or cleared by the [place] FDA and has been authorized for detection and/or diagnosis of SARS-CoV-2 by FDA under an Emergency Use Authorization (EUA). This EUA will remain in effect (meaning this test can be used) for the duration of the COVID-19 declaration under Section 564(b)(1) of the Act, 21 U.S.C. section 360bbb-3(b)(1), unless the authorization is terminated or revoked.     Resp Syncytial Virus by PCR NEGATIVE NEGATIVE    Comment: (NOTE) Fact Sheet for Patients: BloggerCourse.com  Fact Sheet for Healthcare Providers: SeriousBroker.it  This test is not yet approved or cleared by the [place] FDA and has been authorized for detection and/or diagnosis of SARS-CoV-2 by FDA under an Emergency Use Authorization (EUA).  This EUA will remain in effect (meaning this test can be used) for the duration of the COVID-19 declaration under Section 564(b)(1) of the Act, 21 U.S.C. section 360bbb-3(b)(1), unless the authorization is terminated or revoked.  Performed at [facility], [address] [place], [place] [address]   CBC with Differential     Status: Abnormal   Collection Time: 04/09/23 10:20 AM  Result Value Ref Range   WBC 11.4 (H) 4.0 - 10.5 K/uL   RBC 4.20 3.87 - 5.11 MIL/uL   Hemoglobin 13.6 12.0 - 15.0 g/dL   HCT 86.5 78.4 - 69.6 %   MCV 93.6 80.0 - 100.0 fL   MCH 32.4 26.0 - 34.0 pg   MCHC 34.6 30.0 - 36.0 g/dL   RDW 29.5 (H) 28.4 - 13.2 %   Platelets 127 (L) 150 - 400 K/uL   nRBC 0.0 0.0 - 0.2 %   Neutrophils Relative % 82 %   Neutro Abs 9.2 (H) 1.7 - 7.7 K/uL   Lymphocytes Relative 4 %   Lymphs Abs 0.5 (L) 0.7 - 4.0 K/uL   Monocytes Relative 13 %   Monocytes Absolute 1.5 (H) 0.1 - 1.0 K/uL   Eosinophils Relative 0 %   Eosinophils Absolute 0.0 0.0 - 0.5 K/uL   Basophils Relative 0 %   Basophils Absolute 0.0  0.0 - 0.1 K/uL   Immature Granulocytes 1 %   Abs Immature Granulocytes 0.11 (H) 0.00 - 0.07 K/uL    Comment: Performed at [facility], [address], [place], [place] [address]  Comprehensive metabolic panel     Status: Abnormal   Collection Time: 04/09/23 10:20 AM  Result Value Ref Range   Sodium 139 135 - 145 mmol/L   Potassium 3.2 (L) 3.5 - 5.1 mmol/L   Chloride 93 (L) 98 - 111 mmol/L   CO2 31 22 - 32 mmol/L   Glucose, Bld 137 (H) 70 - 99 mg/dL    Comment: Glucose reference range applies only to samples taken after fasting for at least 8 hours.   BUN 22 8 - 23 mg/dL   Creatinine, Ser 6.04 0.44 - 1.00 mg/dL   Calcium 9.9 8.9 - 54.0 mg/dL   Total Protein 6.5 6.5 - 8.1 g/dL   Albumin 3.4 (L) 3.5 - 5.0 g/dL   AST 26 15 - 41 U/L   ALT 14 0 - 44 U/L   Alkaline Phosphatase 45 38 - 126 U/L   Total Bilirubin 1.0 0.3 - 1.2 mg/dL   GFR,  Estimated >98 >11 mL/min    Comment: (NOTE) Calculated using the CKD-EPI Creatinine Equation (2021)    Anion gap 15 5 - 15    Comment: Performed at [facility], [address], [place], [place] [address]  Lactic acid, plasma     Status: None   Collection Time: 04/09/23 10:20 AM  Result Value Ref Range   Lactic Acid, Venous 1.1 0.5 - 1.9 mmol/L    Comment: Performed at [facility], [address] [place], [place] [address]  Lipase, blood     Status: None   Collection Time: 04/09/23 10:20 AM  Result Value Ref Range   Lipase 47 11 - 51 U/L    Comment: Performed at [facility], [address] [place], [place] [address]  Urinalysis, w/ Reflex to Culture (Infection Suspected) -Urine, Clean Catch     Status: Abnormal   Collection Time: 04/09/23 12:30 PM  Result Value Ref Range   Specimen Source URINE, CLEAN CATCH    Color, Urine YELLOW YELLOW   APPearance CLEAR CLEAR   Specific Gravity, Urine 1.033 (H) 1.005 - 1.030   pH 5.0 5.0 - 8.0   Glucose, UA NEGATIVE NEGATIVE mg/dL   Hgb urine dipstick NEGATIVE NEGATIVE   Bilirubin Urine NEGATIVE NEGATIVE   Ketones, ur 5 (A) NEGATIVE mg/dL   Protein, ur 30 (A) NEGATIVE mg/dL   Nitrite NEGATIVE NEGATIVE   Leukocytes,Ua NEGATIVE NEGATIVE   RBC / HPF 0-5 0 - 5 RBC/hpf   WBC, UA 0-5 0 - 5 WBC/hpf    Comment:        Reflex urine culture not performed if WBC <=10, OR if Squamous epithelial cells >5. If Squamous epithelial cells >5 suggest recollection.    Bacteria, UA RARE (A) NONE SEEN   Squamous Epithelial / HPF 0-5 0 - 5 /HPF   Mucus PRESENT    Hyaline Casts, UA PRESENT     Comment: Performed at [facility], [address] [place], [place] [address]  Lactic acid, plasma     Status: None   Collection Time: 04/09/23  2:30 PM  Result Value Ref Range   Lactic Acid, Venous 1.0 0.5 - 1.9 mmol/L    Comment: Performed at [facility], [address]  [place], [place] [address]   CT Cervical Spine Wo Contrast  Result Date: 04/09/2023 CLINICAL DATA:  Poly trauma, blunt.  Multiple falls. EXAM: CT CERVICAL SPINE WITHOUT CONTRAST TECHNIQUE: Multidetector CT imaging of the cervical spine was performed without intravenous contrast. Multiplanar CT image reconstructions were also generated. RADIATION DOSE REDUCTION: This exam was performed according to the departmental dose-optimization program which includes automated exposure control, adjustment of the mA and/or kV according to patient size and/or use of iterative reconstruction technique. COMPARISON:  CT cervical spine 09/06/2022 FINDINGS: Alignment: Straightening without focal angulation or listhesis. Skull base and vertebrae: No evidence of acute cervical spine fracture or traumatic subluxation. Multilevel spondylosis, similar to previous study. Soft tissues and spinal canal: No prevertebral fluid or swelling. No visible canal hematoma. Disc levels: No evidence of large disc herniation or high-grade spinal stenosis. There is multilevel spondylosis with disc space narrowing, endplate osteophytes and facet hypertrophy at multiple levels. There is evidence for mild chronic osseous spinal stenosis, greatest at C5-6. Mild foraminal narrowing is present at multiple levels. Upper chest: Stable mild scarring at both lung apices. Other: Bilateral carotid atherosclerosis. IMPRESSION: 1. No evidence of acute cervical spine fracture, traumatic subluxation or static signs of instability. 2. Multilevel cervical spondylosis, similar to previous study. Electronically Signed   By: Carey Bullocks M.D.   On: 04/09/2023 12:31   CT HEAD WO CONTRAST ( )  Result Date: 04/09/2023 CLINICAL DATA:  Multiple falls with bruising on head and neck. History of meningioma resection. EXAM: CT HEAD WITHOUT CONTRAST TECHNIQUE: Contiguous axial images were obtained from the base of the skull through the vertex without intravenous  contrast. RADIATION DOSE REDUCTION: This exam was performed according to the departmental dose-optimization program which includes automated exposure control, adjustment of the mA and/or kV according to patient size and/or use of iterative reconstruction technique. COMPARISON:  09/08/2022 FINDINGS: Brain: There is no evidence for acute hemorrhage, hydrocephalus, mass lesion, or abnormal extra-axial fluid collection. No definite CT evidence for acute infarction. Patchy low attenuation in the deep hemispheric and periventricular white matter is nonspecific, but likely reflects chronic microvascular ischemic demyelination. High right frontal encephalomalacia underlies a craniotomy defect and is stable in the interval. Vascular: No hyperdense vessel or unexpected calcification. Skull: No evidence for fracture. No worrisome lytic or sclerotic lesion. Frontal craniotomy defect noted. Sinuses/Orbits: The visualized paranasal sinuses and mastoid air cells are clear. Visualized portions of the globes and intraorbital fat are unremarkable. Other: None. IMPRESSION: 1. No acute intracranial abnormality. 2. Stable high right frontal encephalomalacia underlying a craniotomy defect. 3. Stable background chronic small vessel ischemic disease. Electronically Signed   By: Kennith Center M.D.   On: 04/09/2023 12:24   CT CHEST ABDOMEN PELVIS W CONTRAST  Result Date: 04/09/2023 CLINICAL DATA:  Multiple falls. Blunt poly trauma. Chest and abdominal pain. EXAM: CT CHEST, ABDOMEN, AND PELVIS WITH CONTRAST TECHNIQUE: Multidetector CT imaging of the chest, abdomen and pelvis was performed following the standard protocol during bolus administration of intravenous contrast. RADIATION DOSE REDUCTION: This exam was performed according to the departmental dose-optimization program which includes automated exposure control, adjustment of the mA and/or kV according to patient size and/or use of iterative reconstruction technique. CONTRAST:   OMNIPAQUE IOHEXOL 300 MG/ML  SOLN COMPARISON:  None Available. FINDINGS: CT CHEST FINDINGS Cardiovascular: No evidence of thoracic aortic injury or mediastinal hematoma. No pericardial effusion. Mediastinum/Nodes: No evidence of hemorrhage or pneumomediastinum. No masses or pathologically enlarged lymph nodes identified. Mild diffuse esophageal wall thickening, suspicious for esophagitis.  Lungs/Pleura: No evidence of pulmonary contusion or acute infiltrate. No evidence of pneumothorax or hemothorax. Mild chronic interstitial lung disease is seen in subpleural regions. Musculoskeletal: No acute fractures or suspicious bone lesions identified. CT ABDOMEN PELVIS FINDINGS Hepatobiliary: No hepatic laceration or mass identified. Prior cholecystectomy. No evidence of biliary obstruction. Pancreas: No parenchymal laceration, mass, or inflammatory changes identified. Spleen: No evidence of splenic laceration. Adrenal/Urinary Tract: No hemorrhage or parenchymal lacerations identified. No evidence of suspicious masses or hydronephrosis. Stomach/Bowel: Diffuse colonic diverticulosis is seen, without signs of diverticulitis. Mild ascites is seen. Moderately dilated small bowel loops are seen throughout the abdomen and pelvis, with transition point at the site of a small left inguinal hernia containing a single small bowel loop. No evidence of pneumatosis or free intraperitoneal air. Vascular/Lymphatic: No evidence of abdominal aortic injury or retroperitoneal hemorrhage. No pathologically enlarged lymph nodes identified. Reproductive:  No mass or other significant abnormality identified. Other:  None. Musculoskeletal: No acute fractures or suspicious bone lesions identified. IMPRESSION: No evidence of traumatic injury involving the chest, abdomen, or pelvis. Small bowel obstruction due to small left inguinal hernia containing a single small bowel loop. Mild ascites. Colonic diverticulosis, without radiographic evidence of  diverticulitis. Mild diffuse esophageal wall thickening, suspicious for esophagitis. Chronic interstitial lung disease. Electronically Signed   By: Danae Orleans M.D.   On: 04/09/2023 12:22    Pending Labs Unresulted Labs (From admission, onward)     Start     Ordered   04/10/23 0500  CBC  Tomorrow morning,   R        04/09/23 1450   04/10/23 0500  Comprehensive metabolic panel  Tomorrow morning,   R        04/09/23 1450   04/09/23 1452  Magnesium  Add-on,   AD        04/09/23 1451   04/09/23 1452  Phosphorus  Add-on,   AD        04/09/23 1451   04/09/23 1019  Brain natriuretic peptide  Once,   URGENT        04/09/23 1018   04/09/23 1014  TSH  Once,   URGENT        04/09/23 1013   04/09/23 1012  Blood culture (routine x 2)  BLOOD CULTURE X 2,   R (with STAT occurrences)      04/09/23 1013            Vitals/Pain Today's Vitals   04/09/23 1300 04/09/23 1330 04/09/23 1400 04/09/23 1407  BP: (!) 141/70 129/66 (!) 140/69 137/85  Pulse:   91 93  Resp:    16  Temp:    99.2 F (37.3 C)  TempSrc:      SpO2:   99% 98%  Weight:      Height:      PainSc:        Isolation Precautions No active isolations  Medications Medications  acetaminophen (TYLENOL) tablet 650 mg (has no administration in time range)    Or  acetaminophen (TYLENOL) suppository 650 mg (has no administration in time range)  ondansetron (ZOFRAN) tablet 4 mg (has no administration in time range)    Or  ondansetron (ZOFRAN) injection 4 mg (has no administration in time range)  pantoprazole (PROTONIX) injection 40 mg (has no administration in time range)  0.9 % NaCl with KCl 40 mEq / L  infusion (has no administration in time range)  potassium chloride 10 mEq in 100 mL IVPB (has no administration  in time range)  iohexol (OMNIPAQUE) 300 MG/ML solution 100 mL (100 mLs Intravenous Contrast Given 04/09/23 1141)  fentaNYL (SUBLIMAZE) injection 50 mcg (50 mcg Intravenous Given 04/09/23 1430)  diazepam (VALIUM)  injection 5 mg (5 mg Intravenous Given 04/09/23 1453)    Mobility walks with person assist     Focused Assessments Gastro-  SBO and non-reducible hernia    R Recommendations: See Admitting Provider Note  Report given to:   Additional Notes: AOX4, RA, 20 rac saline locked, continent 1 person assist d/t weakness and orthostatic hypotension, multiple falls

## 2023-04-10 ENCOUNTER — Inpatient Hospital Stay (HOSPITAL_COMMUNITY): Payer: Medicare Other

## 2023-04-10 DIAGNOSIS — K56609 Unspecified intestinal obstruction, unspecified as to partial versus complete obstruction: Secondary | ICD-10-CM | POA: Diagnosis not present

## 2023-04-10 LAB — CBC
HCT: 39.4 % (ref 36.0–46.0)
Hemoglobin: 13.2 g/dL (ref 12.0–15.0)
MCH: 32.4 pg (ref 26.0–34.0)
MCHC: 33.5 g/dL (ref 30.0–36.0)
MCV: 96.8 fL (ref 80.0–100.0)
Platelets: 137 10*3/uL — ABNORMAL LOW (ref 150–400)
RBC: 4.07 MIL/uL (ref 3.87–5.11)
RDW: 15.8 % — ABNORMAL HIGH (ref 11.5–15.5)
WBC: 8.6 10*3/uL (ref 4.0–10.5)
nRBC: 0 % (ref 0.0–0.2)

## 2023-04-10 LAB — COMPREHENSIVE METABOLIC PANEL
ALT: 13 U/L (ref 0–44)
AST: 20 U/L (ref 15–41)
Albumin: 3.1 g/dL — ABNORMAL LOW (ref 3.5–5.0)
Alkaline Phosphatase: 39 U/L (ref 38–126)
Anion gap: 12 (ref 5–15)
BUN: 18 mg/dL (ref 8–23)
CO2: 27 mmol/L (ref 22–32)
Calcium: 9 mg/dL (ref 8.9–10.3)
Chloride: 97 mmol/L — ABNORMAL LOW (ref 98–111)
Creatinine, Ser: 0.69 mg/dL (ref 0.44–1.00)
GFR, Estimated: >60 mL/min (ref >60)
Glucose, Bld: 84 mg/dL (ref 70–99)
Potassium: 3.6 mmol/L (ref 3.5–5.1)
Sodium: 136 mmol/L (ref 135–145)
Total Bilirubin: 1 mg/dL (ref <1.2)
Total Protein: 5.9 g/dL — ABNORMAL LOW (ref 6.5–8.1)

## 2023-04-10 MED ORDER — FLUTICASONE FUROATE-VILANTEROL 200-25 MCG/ACT IN AEPB
1.0000 | INHALATION_SPRAY | Freq: Every day | RESPIRATORY_TRACT | Status: DC
  Administered 2023-04-11 – 2023-04-19 (×9): 1 via RESPIRATORY_TRACT
  Filled 2023-04-10: qty 28

## 2023-04-10 MED ORDER — LEVOTHYROXINE SODIUM 50 MCG PO TABS
50.0000 ug | ORAL_TABLET | Freq: Every day | ORAL | Status: DC
Start: 2023-04-11 — End: 2023-04-19
  Administered 2023-04-11 – 2023-04-19 (×9): 50 ug via ORAL
  Filled 2023-04-10 (×9): qty 1

## 2023-04-10 MED ORDER — GABAPENTIN 100 MG PO CAPS
100.0000 mg | ORAL_CAPSULE | Freq: Every day | ORAL | Status: DC
  Filled 2023-04-10 (×2): qty 1

## 2023-04-10 MED ORDER — TRIAMTERENE-HCTZ 37.5-25 MG PO TABS: 1.0000 | ORAL_TABLET | Freq: Every day | ORAL | Status: DC

## 2023-04-10 MED ORDER — FOLIC ACID 1 MG PO TABS
2.0000 mg | ORAL_TABLET | Freq: Every day | ORAL | Status: DC
  Administered 2023-04-10 – 2023-04-19 (×10): 2 mg via ORAL
  Filled 2023-04-10 (×10): qty 2

## 2023-04-10 MED ORDER — PRIMIDONE 50 MG PO TABS
25.0000 mg | ORAL_TABLET | Freq: Every day | ORAL | Status: DC
  Administered 2023-04-12 – 2023-04-16 (×5): 25 mg via ORAL
  Filled 2023-04-10 (×9): qty 0.5

## 2023-04-10 MED ORDER — VITAMIN D3 25 MCG (1000 UNIT) PO TABS
5000.0000 [IU] | ORAL_TABLET | Freq: Every day | ORAL | Status: DC
  Administered 2023-04-11 – 2023-04-19 (×9): 5000 [IU] via ORAL
  Filled 2023-04-10 (×9): qty 5

## 2023-04-10 MED ORDER — ALUM & MAG HYDROXIDE-SIMETH 200-200-20 MG/5ML PO SUSP
30.0000 mL | ORAL | Status: DC | PRN
  Administered 2023-04-10 – 2023-04-14 (×4): 30 mL via ORAL
  Filled 2023-04-10 (×4): qty 30

## 2023-04-10 NOTE — Progress Notes (Signed)
Subjective: Says she is still having some waves of nausea still, but that her abdomen where her hernia was, feels softer.  Still feels bloated and denies flatus or BM.  ROS: See above, otherwise other systems negative  Objective: Vital signs in last 24 hours: Temp:  [97.5 F (36.4 C)-99.2 F (37.3 C)] 98.7 F (37.1 C) (11/04 0503) Pulse Rate:  [76-93] 80 (11/04 0503) Resp:  [16-18] 17 (11/04 0503) BP: (123-143)/(59-85) 142/60 (11/04 0503) SpO2:  [96 %-99 %] 96 % (11/04 0503) Last BM Date : 04/08/23  Intake/Output from previous day: 11/03 0701 - 11/04 0700 In: 535.7 [I.V.:435.7; IV Piggyback:100] Out: -  Intake/Output this shift: No intake/output data recorded.  PE: Gen: elderly, NAD, laying in bed Heart: regular Lungs: CTAB, O2 in place via [place] Abd: soft, seems NT, ND (although patient says she feels bloated), hernia in L inguinal area is not palpable currently.  Lab Results:  Recent Labs    04/09/23 1020 04/10/23 0443  WBC 11.4* 8.6  HGB 13.6 13.2  HCT 39.3 39.4  PLT 127* 137*   BMET Recent Labs    04/09/23 1020 04/10/23 0443  NA 139 136  K 3.2* 3.6  CL 93* 97*  CO2 31 27  GLUCOSE 137* 84  BUN 22 18  CREATININE 0.59 0.69  CALCIUM 9.9 9.0   PT/INR No results for input(s): "LABPROT", "INR" in the last 72 hours. CMP     Component Value Date/Time   NA 136 04/10/2023 0443   K 3.6 04/10/2023 0443   CL 97 (L) 04/10/2023 0443   CO2 27 04/10/2023 0443   GLUCOSE 84 04/10/2023 0443   BUN 18 04/10/2023 0443   CREATININE 0.69 04/10/2023 0443   CREATININE 0.78 03/16/2023 1034   CALCIUM 9.0 04/10/2023 0443   PROT 5.9 (L) 04/10/2023 0443   ALBUMIN 3.1 (L) 04/10/2023 0443   AST 20 04/10/2023 0443   AST 19 03/16/2023 1034   ALT 13 04/10/2023 0443   ALT 11 03/16/2023 1034   ALKPHOS 39 04/10/2023 0443   BILITOT 1.0 04/10/2023 0443   BILITOT 0.4 03/16/2023 1034   GFRNONAA >60 04/10/2023 0443   GFRNONAA >60 03/16/2023 1034   Lipase     Component  Value Date/Time   LIPASE 47 04/09/2023 1020       Studies/Results: CT Cervical Spine Wo Contrast  Result Date: 04/09/2023 CLINICAL DATA:  Poly trauma, blunt.  Multiple falls. EXAM: CT CERVICAL SPINE WITHOUT CONTRAST TECHNIQUE: Multidetector CT imaging of the cervical spine was performed without intravenous contrast. Multiplanar CT image reconstructions were also generated. RADIATION DOSE REDUCTION: This exam was performed according to the departmental dose-optimization program which includes automated exposure control, adjustment of the mA and/or kV according to patient size and/or use of iterative reconstruction technique. COMPARISON:  CT cervical spine 09/06/2022 FINDINGS: Alignment: Straightening without focal angulation or listhesis. Skull base and vertebrae: No evidence of acute cervical spine fracture or traumatic subluxation. Multilevel spondylosis, similar to previous study. Soft tissues and spinal canal: No prevertebral fluid or swelling. No visible canal hematoma. Disc levels: No evidence of large disc herniation or high-grade spinal stenosis. There is multilevel spondylosis with disc space narrowing, endplate osteophytes and facet hypertrophy at multiple levels. There is evidence for mild chronic osseous spinal stenosis, greatest at C5-6. Mild foraminal narrowing is present at multiple levels. Upper chest: Stable mild scarring at both lung apices. Other: Bilateral carotid atherosclerosis. IMPRESSION: 1. No evidence of acute cervical spine fracture, traumatic subluxation  or static signs of instability. 2. Multilevel cervical spondylosis, similar to previous study. Electronically Signed   By: Carey Bullocks M.D.   On: 04/09/2023 12:31   CT HEAD WO CONTRAST ( )  Result Date: 04/09/2023 CLINICAL DATA:  Multiple falls with bruising on head and neck. History of meningioma resection. EXAM: CT HEAD WITHOUT CONTRAST TECHNIQUE: Contiguous axial images were obtained from the base of the skull  through the vertex without intravenous contrast. RADIATION DOSE REDUCTION: This exam was performed according to the departmental dose-optimization program which includes automated exposure control, adjustment of the mA and/or kV according to patient size and/or use of iterative reconstruction technique. COMPARISON:  09/08/2022 FINDINGS: Brain: There is no evidence for acute hemorrhage, hydrocephalus, mass lesion, or abnormal extra-axial fluid collection. No definite CT evidence for acute infarction. Patchy low attenuation in the deep hemispheric and periventricular white matter is nonspecific, but likely reflects chronic microvascular ischemic demyelination. High right frontal encephalomalacia underlies a craniotomy defect and is stable in the interval. Vascular: No hyperdense vessel or unexpected calcification. Skull: No evidence for fracture. No worrisome lytic or sclerotic lesion. Frontal craniotomy defect noted. Sinuses/Orbits: The visualized paranasal sinuses and mastoid air cells are clear. Visualized portions of the globes and intraorbital fat are unremarkable. Other: None. IMPRESSION: 1. No acute intracranial abnormality. 2. Stable high right frontal encephalomalacia underlying a craniotomy defect. 3. Stable background chronic small vessel ischemic disease. Electronically Signed   By: Kennith Center M.D.   On: 04/09/2023 12:24   CT CHEST ABDOMEN PELVIS W CONTRAST  Result Date: 04/09/2023 CLINICAL DATA:  Multiple falls. Blunt poly trauma. Chest and abdominal pain. EXAM: CT CHEST, ABDOMEN, AND PELVIS WITH CONTRAST TECHNIQUE: Multidetector CT imaging of the chest, abdomen and pelvis was performed following the standard protocol during bolus administration of intravenous contrast. RADIATION DOSE REDUCTION: This exam was performed according to the departmental dose-optimization program which includes automated exposure control, adjustment of the mA and/or kV according to patient size and/or use of iterative  reconstruction technique. CONTRAST:  OMNIPAQUE IOHEXOL 300 MG/ML  SOLN COMPARISON:  None Available. FINDINGS: CT CHEST FINDINGS Cardiovascular: No evidence of thoracic aortic injury or mediastinal hematoma. No pericardial effusion. Mediastinum/Nodes: No evidence of hemorrhage or pneumomediastinum. No masses or pathologically enlarged lymph nodes identified. Mild diffuse esophageal wall thickening, suspicious for esophagitis. Lungs/Pleura: No evidence of pulmonary contusion or acute infiltrate. No evidence of pneumothorax or hemothorax. Mild chronic interstitial lung disease is seen in subpleural regions. Musculoskeletal: No acute fractures or suspicious bone lesions identified. CT ABDOMEN PELVIS FINDINGS Hepatobiliary: No hepatic laceration or mass identified. Prior cholecystectomy. No evidence of biliary obstruction. Pancreas: No parenchymal laceration, mass, or inflammatory changes identified. Spleen: No evidence of splenic laceration. Adrenal/Urinary Tract: No hemorrhage or parenchymal lacerations identified. No evidence of suspicious masses or hydronephrosis. Stomach/Bowel: Diffuse colonic diverticulosis is seen, without signs of diverticulitis. Mild ascites is seen. Moderately dilated small bowel loops are seen throughout the abdomen and pelvis, with transition point at the site of a small left inguinal hernia containing a single small bowel loop. No evidence of pneumatosis or free intraperitoneal air. Vascular/Lymphatic: No evidence of abdominal aortic injury or retroperitoneal hemorrhage. No pathologically enlarged lymph nodes identified. Reproductive:  No mass or other significant abnormality identified. Other:  None. Musculoskeletal: No acute fractures or suspicious bone lesions identified. IMPRESSION: No evidence of traumatic injury involving the chest, abdomen, or pelvis. Small bowel obstruction due to small left inguinal hernia containing a single small bowel loop. Mild ascites. Colonic  diverticulosis,  without radiographic evidence of diverticulitis. Mild diffuse esophageal wall thickening, suspicious for esophagitis. Chronic interstitial lung disease. Electronically Signed   By: Danae Orleans M.D.   On: 04/09/2023 12:22    Anti-infectives: Anti-infectives (From admission, onward)    None        Assessment/Plan Incarcerated [facility], s/p reduction in ED  -patient's abdominal exam is very benign, but still complaining of intermittent nausea, bloating, and no bowel function -plain film ordered and reveals several loops of dilated bowel still, similar in appearance to scout film of CT scan yesterday. -d/w medical team.  Feels she is elderly and frail, but no further work up needed prior to possible surgical fixation of this hernia. -concerned given clinically patient tells me she still isn't having bowel function and plain film, that she may require surgery this admit.  -d/w patient and family at the bedside today  FEN - NPO, x sips of liquids from floor, NPO p MN VTE - ok for chemical prophylaxis from our standpoint ID - none currently needed.  COPD - supplemental O2 via [place] Multiple falls Hypothyroidism HLD Hypokalemia Glaucoma  I reviewed hospitalist notes, last 24 h vitals and pain scores, last 48 h intake and output, last 24 h labs and trends, and last 24 h imaging results.   LOS: 1 day    Letha Cape , [facility] 04/10/2023, 12:35 PM Please see Amion for pager number during day hours 7:00am-4:30pm or 7:00am -11:30am on weekends

## 2023-04-10 NOTE — TOC Initial Note (Signed)
Transition of Care [facility]) - Initial/Assessment Note    Patient Details  Name: Kayla Hurley MRN: [id number] Date of Birth: Apr 03, 1935  Transition [facility]) CM/SW Contact:    Lanier Clam, RN Phone Number: 04/10/2023, 3:51 PM  Clinical Narrative: From home, noted NGT-LIS, on 02 will monitor for d/c needs.                  Expected Discharge Plan:  (TBD) Barriers to Discharge: Continued Medical Work up   Patient Goals and CMS Choice Patient states their goals for this hospitalization and ongoing recovery are:: Home CMS Medicare.gov Compare Post Acute Care list provided to:: Patient Represenative (must comment) (Niece Zella Ball) Choice offered to / list presented to : Adult Children (Niece Zella Ball) [place] ownership interest in [facility].provided to:: Adult Children    Expected Discharge Plan and Services   Discharge Planning Services: CM Consult   Living arrangements for the past 2 months: Single Family Home                                      Prior Living Arrangements/Services Living arrangements for the past 2 months: Single Family Home Lives with:: Self Patient language and need for interpreter reviewed:: Yes Do you feel safe going back to the place where you live?: Yes      Need for Family Participation in Patient Care: Yes (Comment) Care giver support system in place?: Yes (comment) Current home services: DME (cane) Criminal Activity/Legal Involvement Pertinent to Current Situation/Hospitalization: No - Comment as needed  Activities of Daily Living   ADL Screening (condition at time of admission) Independently performs ADLs?: No Does the patient have a NEW difficulty with bathing/dressing/toileting/self-feeding that is expected to last >3 days?: Yes (Initiates electronic notice to provider for possible OT consult) Does the patient have a NEW difficulty with getting in/out of bed, walking, or climbing stairs that is expected to last >3 days?:  Yes (Initiates electronic notice to provider for possible PT consult) Does the patient have a NEW difficulty with communication that is expected to last >3 days?: No Is the patient deaf or have difficulty hearing?: No Does the patient have difficulty seeing, even when wearing glasses/contacts?: No Does the patient have difficulty concentrating, remembering, or making decisions?: No  Permission Sought/Granted Permission sought to share information with : Case Manager Permission granted to share information with : Yes, Verbal Permission Granted  Share Information with NAME: Case manager           Emotional Assessment Appearance:: Appears stated age Attitude/Demeanor/Rapport: Gracious Affect (typically observed): Accepting Orientation: : Oriented to Self, Oriented to Place, Oriented to  Time, Oriented to Situation Alcohol / Substance Use: Not Applicable Psych Involvement: No (comment)  Admission diagnosis:  Small bowel obstruction (HCC) [K56.609] SBO (small bowel obstruction) (HCC) [K56.609] Hypoxia [R09.02] Hernia, inguinal, left [K40.90] Fall, initial encounter [W19.XXXA] Patient Active Problem List   Diagnosis Date Noted   Small bowel obstruction (HCC) 04/09/2023   Left inguinal hernia 04/09/2023   HCAP (healthcare-associated pneumonia) 09/08/2022   Hypokalemia 09/08/2022   Hyponatremia 09/08/2022   Severe sepsis (HCC) 09/07/2022   COVID 09/07/2022   COVID-19 virus infection 09/07/2022   OA (osteoarthritis) of hip 09/07/2022   Thrombocytopenia (HCC) 09/06/2022   Fall 09/06/2022   COVID-19 09/06/2022   Chronic rhinitis 06/02/2022   CAP (community acquired pneumonia) 04/19/2022   Viral upper respiratory tract infection  03/31/2022   Impaired cognition 12/23/2021   Seborrheic dermatitis 12/23/2021   Urge incontinence of urine 12/23/2021   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   Chronic cough 08/26/2021   Psoriasis of scalp 05/14/2021   Essential tremor 11/19/2020    Osteoporosis 07/14/2020   Anxiety disorder 02/18/2020   Hypertensive retinopathy 01/07/2020   Primary open angle glaucoma 01/02/2020   Urinary tract infectious disease 12/10/2019   Immunosuppression (HCC) 10/24/2019   Swelling of limb 10/24/2019   Panlobular emphysema (HCC) 04/11/2018   Rheumatoid arthritis (HCC) 04/10/2018   Hypothyroidism 04/10/2018   Dyslipidemia 04/10/2018   Essential hypertension 04/10/2018   PCP:  Pcp, No Pharmacy:   [id number] - [place], [place] - [address] [place] [address] Phone: [phone] Fax: [phone]     Social Determinants of Health (SDOH) Social History: SDOH Screenings   Food Insecurity: No Food Insecurity (04/09/2023)  Housing: Patient Declined (04/09/2023)  Transportation Needs: No Transportation Needs (04/09/2023)  Utilities: Not At Risk (04/09/2023)  Tobacco Use: Medium Risk (01/16/2023)   SDOH Interventions:     Readmission Risk Interventions     No data to display

## 2023-04-10 NOTE — Progress Notes (Signed)
PROGRESS NOTE    Kayla Hurley  XNA:[id number] DOB: April 29, 1935 DOA: 04/09/2023 PCP: Pcp, No    Brief Narrative:  88 year old with history of COPD and chronic hypoxemia on 2 L oxygen, macular degeneration, rheumatoid arthritis, hypertension, hyperlipidemia and essential tremors, hypothyroidism who had fall at home without prodromal symptoms.  She felt like she had a flu.  She was also complaining of left lower quadrant pain due to chronic left inguinal hernia that she used to reduce herself however last night she could not do it.  She also had multiple episodes of vomiting.  Initially hemodynamically stable, on 2 L oxygen.  Found to have incarcerated left inguinal hernia with a small bowel loop.  Mild ascites.  Patient was given mild sedation and hernia was reduced at the bedside by surgery and patient admitted to the hospital.  Subjective: Patient seen and examined.  I also called her sister from her cell phone to update her condition.  Patient tells me that she still has some pain mostly burning pain on her abdomen.  She is very thirsty and wants to have some ice water.  I told her to wait for the surgery.   Assessment & Plan:   Incarcerated left inguinal hernia with a small bowel obstruction: Hernia reduced in the emergency room by surgery. Currently hemodynamically stabilizing.  On maintenance IV fluids. Keeping n.p.o., ice chips.  Will allow sips with medications. KUB shows dilated small bowel loops.  Patient without flatus or bowel movement. Likely repair of hernia in this admission.  Fall and soft tissue injuries: Fairly stable.  Skeletal survey was negative.  Chronic medical issues COPD with asthma, currently not on any exacerbation.  Resume Breo.  Resume bronchodilators. Hypothyroidism: Resume home dose of levothyroxine. Electrolytes: Replaced and adequate. Glaucoma: On timolol drops.          DVT prophylaxis: SCDs Start: 04/09/23 1450   Code Status: Full  code Family Communication: Sister Blenda Bridegroom on the phone. Disposition Plan: Status is: Inpatient Remains inpatient appropriate because: Inpatient surgery planned     Consultants:  General surgery  Procedures:  None  Antimicrobials:  None     Objective: Vitals:   04/09/23 1646 04/09/23 2105 04/10/23 0103 04/10/23 0503  BP: 123/65 (!) 138/59 (!) 143/66 (!) 142/60  Pulse: 87 81 76 80  Resp: 16 16 18 17   Temp: 98.4 F (36.9 C) 98.8 F (37.1 C) (!) 97.5 F (36.4 C) 98.7 F (37.1 C)  TempSrc: Oral Oral Oral Oral  SpO2: 99% 98% 97% 96%  Weight:      Height:        Intake/Output Summary (Last 24 hours) at 04/10/2023 1315 Last data filed at 04/10/2023 0306 Gross per 24 hour  Intake 535.66 ml  Output --  Net 535.66 ml   Filed Weights   04/09/23 0914  Weight: 59 kg    Examination:  General exam: Appears calm and comfortable.  Mildly anxious.  She has some resting tremors.  Frail looking. Respiratory system: Clear to auscultation. Respiratory effort normal. Cardiovascular system: S1 & S2 heard, RRR. No pedal edema. Gastrointestinal system: Abdomen is nondistended, soft , mildly tender on deep palpation.  Bowel sounds are present.  She has multiple previous scars that are nontender.   Central nervous system: Alert and oriented. No focal neurological deficits. Extremities: Symmetric 5 x 5 power. Skin: No rashes, lesions or ulcers Psychiatry: Judgement and insight appear normal. Mood & affect appropriate.     Data Reviewed: I have personally  reviewed following labs and imaging studies  CBC: Recent Labs  Lab 04/09/23 1020 04/10/23 0443  WBC 11.4* 8.6  NEUTROABS 9.2*  --   HGB 13.6 13.2  HCT 39.3 39.4  MCV 93.6 96.8  PLT 127* 137*   Basic Metabolic Panel: Recent Labs  Lab 04/09/23 1020 04/09/23 1712 04/10/23 0443  NA 139  --  136  K 3.2*  --  3.6  CL 93*  --  97*  CO2 31  --  27  GLUCOSE 137*  --  84  BUN 22  --  18  CREATININE 0.59  --  0.69   CALCIUM 9.9  --  9.0  MG  --  2.1  --   PHOS  --  3.6  --    GFR: Estimated Creatinine Clearance: 41.7 mL/min (by C-G formula based on SCr of 0.69 mg/dL). Liver Function Tests: Recent Labs  Lab 04/09/23 1020 04/10/23 0443  AST 26 20  ALT 14 13  ALKPHOS 45 39  BILITOT 1.0 1.0  PROT 6.5 5.9*  ALBUMIN 3.4* 3.1*   Recent Labs  Lab 04/09/23 1020  LIPASE 47   No results for input(s): "AMMONIA" in the last 168 hours. Coagulation Profile: No results for input(s): "INR", "PROTIME" in the last 168 hours. Cardiac Enzymes: No results for input(s): "CKTOTAL", "CKMB", "CKMBINDEX", "TROPONINI" in the last 168 hours. BNP (last 3 results) No results for input(s): "PROBNP" in the last 8760 hours. HbA1C: No results for input(s): "HGBA1C" in the last 72 hours. CBG: No results for input(s): "GLUCAP" in the last 168 hours. Lipid Profile: No results for input(s): "CHOL", "HDL", "LDLCALC", "TRIG", "CHOLHDL", "LDLDIRECT" in the last 72 hours. Thyroid Function Tests: Recent Labs    04/09/23 1712  TSH 4.043   Anemia Panel: No results for input(s): "VITAMINB12", "FOLATE", "FERRITIN", "TIBC", "IRON", "RETICCTPCT" in the last 72 hours. Sepsis Labs: Recent Labs  Lab 04/09/23 1020 04/09/23 1430  LATICACIDVEN 1.1 1.0    Recent Results (from the past 240 hour(s))  Resp panel by RT-PCR (RSV, Flu A&B, Covid) Anterior Nasal Swab     Status: None   Collection Time: 04/09/23 10:12 AM   Specimen: Anterior Nasal Swab  Result Value Ref Range Status   SARS Coronavirus 2 by RT PCR NEGATIVE NEGATIVE Final    Comment: (NOTE) SARS-CoV-2 target nucleic acids are NOT DETECTED.  The SARS-CoV-2 RNA is generally detectable in upper respiratory specimens during the acute phase of infection. The lowest concentration of SARS-CoV-2 viral copies this assay can detect is 138 copies/mL. A negative result does not preclude SARS-Cov-2 infection and should not be used as the sole basis for treatment or other  patient management decisions. A negative result may occur with  improper specimen collection/handling, submission of specimen other than nasopharyngeal swab, presence of viral mutation(s) within the areas targeted by this assay, and inadequate number of viral copies(<138 copies/mL). A negative result must be combined with clinical observations, patient history, and epidemiological information. The expected result is Negative.  Fact Sheet for Patients:  BloggerCourse.com  Fact Sheet for Healthcare Providers:  SeriousBroker.it  This test is no t yet approved or cleared by the [place] FDA and  has been authorized for detection and/or diagnosis of SARS-CoV-2 by FDA under an Emergency Use Authorization (EUA). This EUA will remain  in effect (meaning this test can be used) for the duration of the COVID-19 declaration under Section 564(b)(1) of the Act, 21 U.S.C.section 360bbb-3(b)(1), unless the authorization is terminated  or revoked  sooner.       Influenza A by PCR NEGATIVE NEGATIVE Final   Influenza B by PCR NEGATIVE NEGATIVE Final    Comment: (NOTE) The Xpert Xpress SARS-CoV-2/FLU/RSV plus assay is intended as an aid in the diagnosis of influenza from Nasopharyngeal swab specimens and should not be used as a sole basis for treatment. Nasal washings and aspirates are unacceptable for Xpert Xpress SARS-CoV-2/FLU/RSV testing.  Fact Sheet for Patients: BloggerCourse.com  Fact Sheet for Healthcare Providers: SeriousBroker.it  This test is not yet approved or cleared by the [place] FDA and has been authorized for detection and/or diagnosis of SARS-CoV-2 by FDA under an Emergency Use Authorization (EUA). This EUA will remain in effect (meaning this test can be used) for the duration of the COVID-19 declaration under Section 564(b)(1) of the Act, 21 U.S.C. section  360bbb-3(b)(1), unless the authorization is terminated or revoked.     Resp Syncytial Virus by PCR NEGATIVE NEGATIVE Final    Comment: (NOTE) Fact Sheet for Patients: BloggerCourse.com  Fact Sheet for Healthcare Providers: SeriousBroker.it  This test is not yet approved or cleared by the [place] FDA and has been authorized for detection and/or diagnosis of SARS-CoV-2 by FDA under an Emergency Use Authorization (EUA). This EUA will remain in effect (meaning this test can be used) for the duration of the COVID-19 declaration under Section 564(b)(1) of the Act, 21 U.S.C. section 360bbb-3(b)(1), unless the authorization is terminated or revoked.  Performed at [facility], [address], [place], [place] [address]          Radiology Studies: CT Cervical Spine Wo Contrast  Result Date: 04/09/2023 CLINICAL DATA:  Poly trauma, blunt.  Multiple falls. EXAM: CT CERVICAL SPINE WITHOUT CONTRAST TECHNIQUE: Multidetector CT imaging of the cervical spine was performed without intravenous contrast. Multiplanar CT image reconstructions were also generated. RADIATION DOSE REDUCTION: This exam was performed according to the departmental dose-optimization program which includes automated exposure control, adjustment of the mA and/or kV according to patient size and/or use of iterative reconstruction technique. COMPARISON:  CT cervical spine 09/06/2022 FINDINGS: Alignment: Straightening without focal angulation or listhesis. Skull base and vertebrae: No evidence of acute cervical spine fracture or traumatic subluxation. Multilevel spondylosis, similar to previous study. Soft tissues and spinal canal: No prevertebral fluid or swelling. No visible canal hematoma. Disc levels: No evidence of large disc herniation or high-grade spinal stenosis. There is multilevel spondylosis with disc space narrowing, endplate osteophytes and facet  hypertrophy at multiple levels. There is evidence for mild chronic osseous spinal stenosis, greatest at C5-6. Mild foraminal narrowing is present at multiple levels. Upper chest: Stable mild scarring at both lung apices. Other: Bilateral carotid atherosclerosis. IMPRESSION: 1. No evidence of acute cervical spine fracture, traumatic subluxation or static signs of instability. 2. Multilevel cervical spondylosis, similar to previous study. Electronically Signed   By: Carey Bullocks M.D.   On: 04/09/2023 12:31   CT HEAD WO CONTRAST ( )  Result Date: 04/09/2023 CLINICAL DATA:  Multiple falls with bruising on head and neck. History of meningioma resection. EXAM: CT HEAD WITHOUT CONTRAST TECHNIQUE: Contiguous axial images were obtained from the base of the skull through the vertex without intravenous contrast. RADIATION DOSE REDUCTION: This exam was performed according to the departmental dose-optimization program which includes automated exposure control, adjustment of the mA and/or kV according to patient size and/or use of iterative reconstruction technique. COMPARISON:  09/08/2022 FINDINGS: Brain: There is no evidence for acute hemorrhage, hydrocephalus, mass lesion, or abnormal  extra-axial fluid collection. No definite CT evidence for acute infarction. Patchy low attenuation in the deep hemispheric and periventricular white matter is nonspecific, but likely reflects chronic microvascular ischemic demyelination. High right frontal encephalomalacia underlies a craniotomy defect and is stable in the interval. Vascular: No hyperdense vessel or unexpected calcification. Skull: No evidence for fracture. No worrisome lytic or sclerotic lesion. Frontal craniotomy defect noted. Sinuses/Orbits: The visualized paranasal sinuses and mastoid air cells are clear. Visualized portions of the globes and intraorbital fat are unremarkable. Other: None. IMPRESSION: 1. No acute intracranial abnormality. 2. Stable high right frontal  encephalomalacia underlying a craniotomy defect. 3. Stable background chronic small vessel ischemic disease. Electronically Signed   By: Kennith Center M.D.   On: 04/09/2023 12:24   CT CHEST ABDOMEN PELVIS W CONTRAST  Result Date: 04/09/2023 CLINICAL DATA:  Multiple falls. Blunt poly trauma. Chest and abdominal pain. EXAM: CT CHEST, ABDOMEN, AND PELVIS WITH CONTRAST TECHNIQUE: Multidetector CT imaging of the chest, abdomen and pelvis was performed following the standard protocol during bolus administration of intravenous contrast. RADIATION DOSE REDUCTION: This exam was performed according to the departmental dose-optimization program which includes automated exposure control, adjustment of the mA and/or kV according to patient size and/or use of iterative reconstruction technique. CONTRAST:  OMNIPAQUE IOHEXOL 300 MG/ML  SOLN COMPARISON:  None Available. FINDINGS: CT CHEST FINDINGS Cardiovascular: No evidence of thoracic aortic injury or mediastinal hematoma. No pericardial effusion. Mediastinum/Nodes: No evidence of hemorrhage or pneumomediastinum. No masses or pathologically enlarged lymph nodes identified. Mild diffuse esophageal wall thickening, suspicious for esophagitis. Lungs/Pleura: No evidence of pulmonary contusion or acute infiltrate. No evidence of pneumothorax or hemothorax. Mild chronic interstitial lung disease is seen in subpleural regions. Musculoskeletal: No acute fractures or suspicious bone lesions identified. CT ABDOMEN PELVIS FINDINGS Hepatobiliary: No hepatic laceration or mass identified. Prior cholecystectomy. No evidence of biliary obstruction. Pancreas: No parenchymal laceration, mass, or inflammatory changes identified. Spleen: No evidence of splenic laceration. Adrenal/Urinary Tract: No hemorrhage or parenchymal lacerations identified. No evidence of suspicious masses or hydronephrosis. Stomach/Bowel: Diffuse colonic diverticulosis is seen, without signs of diverticulitis. Mild  ascites is seen. Moderately dilated small bowel loops are seen throughout the abdomen and pelvis, with transition point at the site of a small left inguinal hernia containing a single small bowel loop. No evidence of pneumatosis or free intraperitoneal air. Vascular/Lymphatic: No evidence of abdominal aortic injury or retroperitoneal hemorrhage. No pathologically enlarged lymph nodes identified. Reproductive:  No mass or other significant abnormality identified. Other:  None. Musculoskeletal: No acute fractures or suspicious bone lesions identified. IMPRESSION: No evidence of traumatic injury involving the chest, abdomen, or pelvis. Small bowel obstruction due to small left inguinal hernia containing a single small bowel loop. Mild ascites. Colonic diverticulosis, without radiographic evidence of diverticulitis. Mild diffuse esophageal wall thickening, suspicious for esophagitis. Chronic interstitial lung disease. Electronically Signed   By: Danae Orleans M.D.   On: 04/09/2023 12:22        Scheduled Meds:  pantoprazole (PROTONIX) IV  40 mg Intravenous Q24H   timolol  1 drop Both Eyes BID   Continuous Infusions:   LOS: 1 day    Time spent: 40 minutes    Dorcas Carrow, MD [facility]

## 2023-04-11 ENCOUNTER — Encounter (HOSPITAL_COMMUNITY): Payer: Self-pay | Admitting: *Deleted

## 2023-04-11 DIAGNOSIS — K56609 Unspecified intestinal obstruction, unspecified as to partial versus complete obstruction: Secondary | ICD-10-CM | POA: Diagnosis not present

## 2023-04-11 LAB — SURGICAL PCR SCREEN
MRSA, PCR: NEGATIVE
Staphylococcus aureus: NEGATIVE

## 2023-04-11 MED ORDER — SIMETHICONE 80 MG PO CHEW
80.0000 mg | CHEWABLE_TABLET | Freq: Once | ORAL | Status: AC
  Administered 2023-04-12: 80 mg via ORAL
  Filled 2023-04-11: qty 1

## 2023-04-11 MED ORDER — CEFAZOLIN SODIUM-DEXTROSE 2-4 GM/100ML-% IV SOLN
2.0000 g | Freq: Once | INTRAVENOUS | Status: AC
  Administered 2023-04-12: 2 g via INTRAVENOUS
  Filled 2023-04-11: qty 100

## 2023-04-11 NOTE — Plan of Care (Signed)
  Problem: Education: Goal: Knowledge of General Education information will improve Description: Including pain rating scale, medication(s)/side effects and non-pharmacologic comfort measures Outcome: Adequate for Discharge   Problem: Clinical Measurements: Goal: Ability to maintain clinical measurements within normal limits will improve Outcome: Progressing Goal: Will remain free from infection Outcome: Progressing Goal: Diagnostic test results will improve Outcome: Progressing Goal: Respiratory complications will improve Outcome: Progressing Goal: Cardiovascular complication will be avoided Outcome: Progressing   Problem: Activity: Goal: Risk for activity intolerance will decrease Outcome: Progressing   Problem: Nutrition: Goal: Adequate nutrition will be maintained Outcome: Progressing   Problem: Coping: Goal: Level of anxiety will decrease Outcome: Progressing   Problem: Elimination: Goal: Will not experience complications related to bowel motility Outcome: Not Progressing Goal: Will not experience complications related to urinary retention Outcome: Progressing   Problem: Pain Management: Goal: General experience of comfort will improve Outcome: Progressing   Problem: Safety: Goal: Ability to remain free from injury will improve Outcome: Progressing   Problem: Skin Integrity: Goal: Risk for impaired skin integrity will decrease Outcome: Progressing

## 2023-04-11 NOTE — Progress Notes (Signed)
Subjective: She has been NPO and continues to feel bloated and denies BM or flatus. She denies nauseas. She has mobilized with therapies. She feels like her hernia has recurred but is not having pain  ROS: See above, otherwise other systems negative  Objective: Vital signs in last 24 hours: Temp:  [98.7 F (37.1 C)-99.3 F (37.4 C)] 99.3 F (37.4 C) (11/05 0457) Pulse Rate:  [71-87] 87 (11/05 0457) Resp:  [16-18] 18 (11/05 0457) BP: (132-143)/(65-66) 132/65 (11/05 0457) SpO2:  [95 %-98 %] 95 % (11/05 0907) Last BM Date : 04/08/23  Intake/Output from previous day: 11/04 0701 - 11/05 0700 In: 0  Out: 300 [Urine:300] Intake/Output this shift: No intake/output data recorded.  PE: Gen: elderly, NAD, sitting up in chair Heart: regular Lungs: CTAB, O2 in place via [place] Abd: soft, seems NT, mild distension, hernia in L inguinal area is not palpable currently.  Lab Results:  Recent Labs    04/09/23 1020 04/10/23 0443  WBC 11.4* 8.6  HGB 13.6 13.2  HCT 39.3 39.4  PLT 127* 137*   BMET Recent Labs    04/09/23 1020 04/10/23 0443  NA 139 136  K 3.2* 3.6  CL 93* 97*  CO2 31 27  GLUCOSE 137* 84  BUN 22 18  CREATININE 0.59 0.69  CALCIUM 9.9 9.0   PT/INR No results for input(s): "LABPROT", "INR" in the last 72 hours. CMP     Component Value Date/Time   NA 136 04/10/2023 0443   K 3.6 04/10/2023 0443   CL 97 (L) 04/10/2023 0443   CO2 27 04/10/2023 0443   GLUCOSE 84 04/10/2023 0443   BUN 18 04/10/2023 0443   CREATININE 0.69 04/10/2023 0443   CREATININE 0.78 03/16/2023 1034   CALCIUM 9.0 04/10/2023 0443   PROT 5.9 (L) 04/10/2023 0443   ALBUMIN 3.1 (L) 04/10/2023 0443   AST 20 04/10/2023 0443   AST 19 03/16/2023 1034   ALT 13 04/10/2023 0443   ALT 11 03/16/2023 1034   ALKPHOS 39 04/10/2023 0443   BILITOT 1.0 04/10/2023 0443   BILITOT 0.4 03/16/2023 1034   GFRNONAA >60 04/10/2023 0443   GFRNONAA >60 03/16/2023 1034   Lipase     Component Value  Date/Time   LIPASE 47 04/09/2023 1020       Studies/Results: DG Abd Portable 1V  Result Date: 04/10/2023 CLINICAL DATA:  Small-bowel obstruction EXAM: PORTABLE ABDOMEN - 1 VIEW COMPARISON:  Procedure description CT abdomen/pelvis 1 day prior FINDINGS: Findings diffuse gaseous distention of the small bowel is noted with loops measuring up to 3.7 cm, grossly similar to the prior CT. There is no definite free intraperitoneal air. There is no abnormal soft tissue calcification. Cholecystectomy clips are noted. There is no acute osseous abnormality. IMPRESSION: Gaseous distention of the small bowel is grossly similar to the prior CT, consistent with small-bowel obstruction. Electronically Signed   By: Lesia Hausen M.D.   On: 04/10/2023 14:50   CT Cervical Spine Wo Contrast  Result Date: 04/09/2023 CLINICAL DATA:  Poly trauma, blunt.  Multiple falls. EXAM: CT CERVICAL SPINE WITHOUT CONTRAST TECHNIQUE: Multidetector CT imaging of the cervical spine was performed without intravenous contrast. Multiplanar CT image reconstructions were also generated. RADIATION DOSE REDUCTION: This exam was performed according to the departmental dose-optimization program which includes automated exposure control, adjustment of the mA and/or kV according to patient size and/or use of iterative reconstruction technique. COMPARISON:  CT cervical spine 09/06/2022 FINDINGS: Alignment: Straightening without focal  angulation or listhesis. Skull base and vertebrae: No evidence of acute cervical spine fracture or traumatic subluxation. Multilevel spondylosis, similar to previous study. Soft tissues and spinal canal: No prevertebral fluid or swelling. No visible canal hematoma. Disc levels: No evidence of large disc herniation or high-grade spinal stenosis. There is multilevel spondylosis with disc space narrowing, endplate osteophytes and facet hypertrophy at multiple levels. There is evidence for mild chronic osseous spinal stenosis,  greatest at C5-6. Mild foraminal narrowing is present at multiple levels. Upper chest: Stable mild scarring at both lung apices. Other: Bilateral carotid atherosclerosis. IMPRESSION: 1. No evidence of acute cervical spine fracture, traumatic subluxation or static signs of instability. 2. Multilevel cervical spondylosis, similar to previous study. Electronically Signed   By: Carey Bullocks M.D.   On: 04/09/2023 12:31   CT HEAD WO CONTRAST ( )  Result Date: 04/09/2023 CLINICAL DATA:  Multiple falls with bruising on head and neck. History of meningioma resection. EXAM: CT HEAD WITHOUT CONTRAST TECHNIQUE: Contiguous axial images were obtained from the base of the skull through the vertex without intravenous contrast. RADIATION DOSE REDUCTION: This exam was performed according to the departmental dose-optimization program which includes automated exposure control, adjustment of the mA and/or kV according to patient size and/or use of iterative reconstruction technique. COMPARISON:  09/08/2022 FINDINGS: Brain: There is no evidence for acute hemorrhage, hydrocephalus, mass lesion, or abnormal extra-axial fluid collection. No definite CT evidence for acute infarction. Patchy low attenuation in the deep hemispheric and periventricular white matter is nonspecific, but likely reflects chronic microvascular ischemic demyelination. High right frontal encephalomalacia underlies a craniotomy defect and is stable in the interval. Vascular: No hyperdense vessel or unexpected calcification. Skull: No evidence for fracture. No worrisome lytic or sclerotic lesion. Frontal craniotomy defect noted. Sinuses/Orbits: The visualized paranasal sinuses and mastoid air cells are clear. Visualized portions of the globes and intraorbital fat are unremarkable. Other: None. IMPRESSION: 1. No acute intracranial abnormality. 2. Stable high right frontal encephalomalacia underlying a craniotomy defect. 3. Stable background chronic small vessel  ischemic disease. Electronically Signed   By: Kennith Center M.D.   On: 04/09/2023 12:24   CT CHEST ABDOMEN PELVIS W CONTRAST  Result Date: 04/09/2023 CLINICAL DATA:  Multiple falls. Blunt poly trauma. Chest and abdominal pain. EXAM: CT CHEST, ABDOMEN, AND PELVIS WITH CONTRAST TECHNIQUE: Multidetector CT imaging of the chest, abdomen and pelvis was performed following the standard protocol during bolus administration of intravenous contrast. RADIATION DOSE REDUCTION: This exam was performed according to the departmental dose-optimization program which includes automated exposure control, adjustment of the mA and/or kV according to patient size and/or use of iterative reconstruction technique. CONTRAST:  OMNIPAQUE IOHEXOL 300 MG/ML  SOLN COMPARISON:  None Available. FINDINGS: CT CHEST FINDINGS Cardiovascular: No evidence of thoracic aortic injury or mediastinal hematoma. No pericardial effusion. Mediastinum/Nodes: No evidence of hemorrhage or pneumomediastinum. No masses or pathologically enlarged lymph nodes identified. Mild diffuse esophageal wall thickening, suspicious for esophagitis. Lungs/Pleura: No evidence of pulmonary contusion or acute infiltrate. No evidence of pneumothorax or hemothorax. Mild chronic interstitial lung disease is seen in subpleural regions. Musculoskeletal: No acute fractures or suspicious bone lesions identified. CT ABDOMEN PELVIS FINDINGS Hepatobiliary: No hepatic laceration or mass identified. Prior cholecystectomy. No evidence of biliary obstruction. Pancreas: No parenchymal laceration, mass, or inflammatory changes identified. Spleen: No evidence of splenic laceration. Adrenal/Urinary Tract: No hemorrhage or parenchymal lacerations identified. No evidence of suspicious masses or hydronephrosis. Stomach/Bowel: Diffuse colonic diverticulosis is seen, without signs of diverticulitis. Mild ascites  is seen. Moderately dilated small bowel loops are seen throughout the abdomen and  pelvis, with transition point at the site of a small left inguinal hernia containing a single small bowel loop. No evidence of pneumatosis or free intraperitoneal air. Vascular/Lymphatic: No evidence of abdominal aortic injury or retroperitoneal hemorrhage. No pathologically enlarged lymph nodes identified. Reproductive:  No mass or other significant abnormality identified. Other:  None. Musculoskeletal: No acute fractures or suspicious bone lesions identified. IMPRESSION: No evidence of traumatic injury involving the chest, abdomen, or pelvis. Small bowel obstruction due to small left inguinal hernia containing a single small bowel loop. Mild ascites. Colonic diverticulosis, without radiographic evidence of diverticulitis. Mild diffuse esophageal wall thickening, suspicious for esophagitis. Chronic interstitial lung disease. Electronically Signed   By: Danae Orleans M.D.   On: 04/09/2023 12:22    Anti-infectives: Anti-infectives (From admission, onward)    None        Assessment/Plan Incarcerated [facility]H, s/p reduction in ED  - exam remains benign though continued bloating and no bowel function -plain film 11/4 reveals several loops of dilated bowel still, similar in appearance to scout film of CT scan . -d/w medical team.  Feels she is elderly and frail, but no further work up needed prior to possible surgical fixation of this hernia. -plan for OR tomorrow. Can have sips of clears today  FEN - NPO, x sips of liquids from floor, NPO p MN VTE - ok for chemical prophylaxis from our standpoint ID - none currently needed.  COPD - supplemental O2 via [place] Multiple falls Hypothyroidism HLD Hypokalemia Glaucoma  I reviewed hospitalist notes, last 24 h vitals and pain scores, last 48 h intake and output, last 24 h labs and trends, and last 24 h imaging results.   LOS: 2 days    Eric Form , [facility] 04/11/2023, 9:27 AM Please see Amion for pager number during day  hours 7:00am-4:30pm or 7:00am -11:30am on weekends

## 2023-04-11 NOTE — Progress Notes (Signed)
PROGRESS NOTE    Kayla Hurley  ZOX:[id number] DOB: 03-Apr-1935 DOA: 04/09/2023 PCP: Pcp, No    Brief Narrative:  88 year old with history of COPD and chronic hypoxemia on 2 L oxygen, macular degeneration, rheumatoid arthritis, hypertension, hyperlipidemia and essential tremors, hypothyroidism who had fall at home without prodromal symptoms.  She felt like she had a flu.  She was also complaining of left lower quadrant pain due to chronic left inguinal hernia that she used to reduce herself however last night she could not do it.  She also had multiple episodes of vomiting.  Initially hemodynamically stable, on 2 L oxygen.  Found to have incarcerated left inguinal hernia with a small bowel loop.  Mild ascites.  Patient was given mild sedation and hernia was reduced at the bedside by surgery and patient admitted to the hospital.  Subjective:  Patient seen and examined.  Still has mild pain.  She thinks her hernia has popped out.  Sister on the phone.  Agreeable to hernia repair with surgery tomorrow.   Assessment & Plan:   Incarcerated left inguinal hernia with a small bowel obstruction: Hernia reduced in the emergency room by surgery. Currently hemodynamically stabilizing.  On maintenance IV fluids. Keeping on clears today.  KUB shows dilated small bowel loops.  Patient without flatus or bowel movement. Likely repair of hernia in this admission.,  Scheduled for tomorrow.  Fall and soft tissue injuries: Fairly stable.  Skeletal survey was negative.  Chronic medical issues COPD with asthma, currently not on any exacerbation.  Resume Breo.  Resume bronchodilators. Hypothyroidism: Resume home dose of levothyroxine. Electrolytes: Replaced and adequate. Glaucoma: On timolol drops.          DVT prophylaxis: SCDs Start: 04/09/23 1450   Code Status: Full code Family Communication: Sister Blenda Bridegroom on the phone. Disposition Plan: Status is: Inpatient Remains inpatient appropriate  because: Inpatient surgery planned     Consultants:  General surgery  Procedures:  None  Antimicrobials:  None     Objective: Vitals:   04/10/23 1336 04/11/23 0457 04/11/23 0907 04/11/23 1218  BP: (!) 143/66 132/65  137/62  Pulse: 71 87  74  Resp: 16 18  16   Temp: 98.7 F (37.1 C) 99.3 F (37.4 C)  (!) 97.5 F (36.4 C)  TempSrc: Oral Oral  Oral  SpO2: 98% 95% 95% 93%  Weight:      Height:        Intake/Output Summary (Last 24 hours) at 04/11/2023 1312 Last data filed at 04/10/2023 1800 Gross per 24 hour  Intake 0 ml  Output 300 ml  Net -300 ml   Filed Weights   04/09/23 0914  Weight: 59 kg    Examination:  General exam: Appears calm and comfortable.Frail looking. Respiratory system: Clear to auscultation. Respiratory effort normal. Cardiovascular system: S1 & S2 heard, RRR. No pedal edema. Gastrointestinal system: Abdomen is nondistended, soft , mildly tender on deep palpation.  Bowel sounds are present.  She has multiple previous scars that are nontender.   Central nervous system: Alert and oriented. No focal neurological deficits. Extremities: Symmetric 5 x 5 power. Skin: No rashes, lesions or ulcers Psychiatry: Judgement and insight appear normal. Mood & affect appropriate.     Data Reviewed: I have personally reviewed following labs and imaging studies  CBC: Recent Labs  Lab 04/09/23 1020 04/10/23 0443  WBC 11.4* 8.6  NEUTROABS 9.2*  --   HGB 13.6 13.2  HCT 39.3 39.4  MCV 93.6 96.8  PLT  127* 137*   Basic Metabolic Panel: Recent Labs  Lab 04/09/23 1020 04/09/23 1712 04/10/23 0443  NA 139  --  136  K 3.2*  --  3.6  CL 93*  --  97*  CO2 31  --  27  GLUCOSE 137*  --  84  BUN 22  --  18  CREATININE 0.59  --  0.69  CALCIUM 9.9  --  9.0  MG  --  2.1  --   PHOS  --  3.6  --    GFR: Estimated Creatinine Clearance: 41.7 mL/min (by C-G formula based on SCr of 0.69 mg/dL). Liver Function Tests: Recent Labs  Lab 04/09/23 1020  04/10/23 0443  AST 26 20  ALT 14 13  ALKPHOS 45 39  BILITOT 1.0 1.0  PROT 6.5 5.9*  ALBUMIN 3.4* 3.1*   Recent Labs  Lab 04/09/23 1020  LIPASE 47   No results for input(s): "AMMONIA" in the last 168 hours. Coagulation Profile: No results for input(s): "INR", "PROTIME" in the last 168 hours. Cardiac Enzymes: No results for input(s): "CKTOTAL", "CKMB", "CKMBINDEX", "TROPONINI" in the last 168 hours. BNP (last 3 results) No results for input(s): "PROBNP" in the last 8760 hours. HbA1C: No results for input(s): "HGBA1C" in the last 72 hours. CBG: No results for input(s): "GLUCAP" in the last 168 hours. Lipid Profile: No results for input(s): "CHOL", "HDL", "LDLCALC", "TRIG", "CHOLHDL", "LDLDIRECT" in the last 72 hours. Thyroid Function Tests: Recent Labs    04/09/23 1712  TSH 4.043   Anemia Panel: No results for input(s): "VITAMINB12", "FOLATE", "FERRITIN", "TIBC", "IRON", "RETICCTPCT" in the last 72 hours. Sepsis Labs: Recent Labs  Lab 04/09/23 1020 04/09/23 1430  LATICACIDVEN 1.1 1.0    Recent Results (from the past 240 hour(s))  Resp panel by RT-PCR (RSV, Flu A&B, Covid) Anterior Nasal Swab     Status: None   Collection Time: 04/09/23 10:12 AM   Specimen: Anterior Nasal Swab  Result Value Ref Range Status   SARS Coronavirus 2 by RT PCR NEGATIVE NEGATIVE Final    Comment: (NOTE) SARS-CoV-2 target nucleic acids are NOT DETECTED.  The SARS-CoV-2 RNA is generally detectable in upper respiratory specimens during the acute phase of infection. The lowest concentration of SARS-CoV-2 viral copies this assay can detect is 138 copies/mL. A negative result does not preclude SARS-Cov-2 infection and should not be used as the sole basis for treatment or other patient management decisions. A negative result may occur with  improper specimen collection/handling, submission of specimen other than nasopharyngeal swab, presence of viral mutation(s) within the areas targeted by  this assay, and inadequate number of viral copies(<138 copies/mL). A negative result must be combined with clinical observations, patient history, and epidemiological information. The expected result is Negative.  Fact Sheet for Patients:  BloggerCourse.com  Fact Sheet for Healthcare Providers:  SeriousBroker.it  This test is no t yet approved or cleared by the [place] FDA and  has been authorized for detection and/or diagnosis of SARS-CoV-2 by FDA under an Emergency Use Authorization (EUA). This EUA will remain  in effect (meaning this test can be used) for the duration of the COVID-19 declaration under Section 564(b)(1) of the Act, 21 U.S.C.section 360bbb-3(b)(1), unless the authorization is terminated  or revoked sooner.       Influenza A by PCR NEGATIVE NEGATIVE Final   Influenza B by PCR NEGATIVE NEGATIVE Final    Comment: (NOTE) The Xpert Xpress SARS-CoV-2/FLU/RSV plus assay is intended as an aid in  the diagnosis of influenza from Nasopharyngeal swab specimens and should not be used as a sole basis for treatment. Nasal washings and aspirates are unacceptable for Xpert Xpress SARS-CoV-2/FLU/RSV testing.  Fact Sheet for Patients: BloggerCourse.com  Fact Sheet for Healthcare Providers: SeriousBroker.it  This test is not yet approved or cleared by the [place] FDA and has been authorized for detection and/or diagnosis of SARS-CoV-2 by FDA under an Emergency Use Authorization (EUA). This EUA will remain in effect (meaning this test can be used) for the duration of the COVID-19 declaration under Section 564(b)(1) of the Act, 21 U.S.C. section 360bbb-3(b)(1), unless the authorization is terminated or revoked.     Resp Syncytial Virus by PCR NEGATIVE NEGATIVE Final    Comment: (NOTE) Fact Sheet for Patients: BloggerCourse.com  Fact Sheet  for Healthcare Providers: SeriousBroker.it  This test is not yet approved or cleared by the [place] FDA and has been authorized for detection and/or diagnosis of SARS-CoV-2 by FDA under an Emergency Use Authorization (EUA). This EUA will remain in effect (meaning this test can be used) for the duration of the COVID-19 declaration under Section 564(b)(1) of the Act, 21 U.S.C. section 360bbb-3(b)(1), unless the authorization is terminated or revoked.  Performed at [facility], [address] [place], [place] [address]   Blood culture (routine x 2)     Status: None (Preliminary result)   Collection Time: 04/09/23 10:20 AM   Specimen: BLOOD  Result Value Ref Range Status   Specimen Description   Final    BLOOD SITE NOT SPECIFIED Performed at [facility], [address] [place], [place] [address]    Special Requests   Final    BOTTLES DRAWN AEROBIC AND ANAEROBIC Blood Culture adequate volume Performed at [facility], [address] [place], [place] [address]    Culture   Final    NO GROWTH 1 DAY Performed at [facility], [address], [place], [place] [address]    Report Status PENDING  Incomplete  Blood culture (routine x 2)     Status: None (Preliminary result)   Collection Time: 04/09/23 10:30 AM   Specimen: BLOOD  Result Value Ref Range Status   Specimen Description   Final    BLOOD SITE NOT SPECIFIED Performed at [facility], [address], [place], [place] [address]    Special Requests   Final    BOTTLES DRAWN AEROBIC AND ANAEROBIC Blood Culture adequate volume Performed at [facility], [address] [place], [place] [address]    Culture   Final    NO GROWTH 1 DAY Performed at [facility], [address] [place], [place] [address]    Report Status PENDING  Incomplete         Radiology Studies: DG Abd Portable  1V  Result Date: 04/10/2023 CLINICAL DATA:  Small-bowel obstruction EXAM: PORTABLE ABDOMEN - 1 VIEW COMPARISON:  Procedure description CT abdomen/pelvis 1 day prior FINDINGS: Findings diffuse gaseous distention of the small bowel is noted with loops measuring up to 3.7 cm, grossly similar to the prior CT. There is no definite free intraperitoneal air. There is no abnormal soft tissue calcification. Cholecystectomy clips are noted. There is no acute osseous abnormality. IMPRESSION: Gaseous distention of the small bowel is grossly similar to the prior CT, consistent with small-bowel obstruction. Electronically Signed   By: Lesia Hausen M.D.   On: 04/10/2023 14:50  Scheduled Meds:  cholecalciferol  5,000 Units Oral Daily   fluticasone furoate-vilanterol  1 puff Inhalation Daily   folic acid  2 mg Oral Daily   gabapentin  100 mg Oral QHS   levothyroxine  50 mcg Oral QAC breakfast   pantoprazole (PROTONIX) IV  40 mg Intravenous Q24H   primidone  25 mg Oral QHS   timolol  1 drop Both Eyes BID   Continuous Infusions:   LOS: 2 days    Time spent: 40 minutes    Dorcas Carrow, MD [facility]

## 2023-04-11 NOTE — Evaluation (Signed)
Occupational Therapy Evaluation Patient Details Name: Kayla Hurley MRN: [id number] DOB: 10-26-1934 Today's Date: 04/11/2023   History of Present Illness 88 y.o. female with medical history significant of COPD, COVID-19, glaucoma, hypertensive retinopathy, macular degeneration, meningioma, rheumatoid arthritis, essential hypertension, hyperlipidemia, essential tremor, hypothyroidism, osteoarthritis, rheumatoid arthritis, thrombocytopenia who had 2 falls at home last night resulting in a contusion on her chest and multiple abrasions and ecchymosis on her arms. Found to have incarcerated left inguinal hernia with a small bowel loop.  Mild ascites.  Patient was given mild sedation and hernia was reduced at the bedside by surgery and patient admitted to the hospital. Plan for OR on 04/12/23 for hernia repair.   Clinical Impression   Pt admitted with the above diagnosis. Pt currently with functional limitations due to the deficits listed below (see OT Problem List). Currently, pt is limited by decreased activity tolerance, endurance, pain caused by hernia, and orthostatic hypotension. She is able to complete BADL tasks with SBA and increased time. Plan is for surgical intervention on 11/6 for hernia. Recommend re-evaluating pt post op to determine d/c recommendation and follow up therapy and DME needed. Pt will benefit from acute skilled OT to increase their safety and independence with ADL and functional mobility for ADL to facilitate discharge. Next session: Re-evaluation after hernia surgery 11/6.        If plan is discharge home, recommend the following: Other (comment) (TBD)    Functional Status Assessment  Patient has had a recent decline in their functional status and demonstrates the ability to make significant improvements in function in a reasonable and predictable amount of time.  Equipment Recommendations  Other (comment) (TBD)    Recommendations for Other Services PT consult      Precautions / Restrictions Precautions Precautions: Fall Restrictions Weight Bearing Restrictions: No      Mobility Bed Mobility  General bed mobility comments: Pt up in recliner upon therapy arrival.    Transfers Overall transfer level: Needs assistance Equipment used: None Transfers: Sit to/from Stand Sit to Stand: Supervision           General transfer comment: Pt able to complete sit to stand from recliner with no VC for hand placement or DME for balance. Limited standing tolerance noted. Pt was able to stand for ~1 minute before reports of lightheadness and request to sit. BP reflected a decrease of at least 10 points for diastolic.      Balance Overall balance assessment: History of Falls       ADL either performed or assessed with clinical judgement   ADL     General ADL Comments: Pt requires SBA for BADL tasks at this time and increased time due to fatigue and decreased activity tolerance in addition to occasional hernia pain     Vision Baseline Vision/History: 1 Wears glasses;3 Glaucoma;6 Macular Degeneration Ability to See in Adequate Light: 1 Impaired Patient Visual Report: No change from baseline Additional Comments: Pt reports that her right eye has better vision.     Perception Perception: Not tested       Praxis Praxis: Not tested       Pertinent Vitals/Pain Pain Assessment Pain Assessment: Faces Faces Pain Scale: Hurts even more Pain Location: hernia Pain Descriptors / Indicators: Grimacing, Discomfort Pain Intervention(s): Monitored during session     Extremity/Trunk Assessment Upper Extremity Assessment Upper Extremity Assessment: Generalized weakness   Lower Extremity Assessment Lower Extremity Assessment: Generalized weakness   Cervical / Trunk Assessment Cervical / Trunk Assessment:  Normal   Communication Communication Communication: No apparent difficulties   Cognition Arousal: Alert Behavior During Therapy: WFL for tasks  assessed/performed Overall Cognitive Status: Within Functional Limits for tasks assessed     General Comments  BP monitored during session: sitting 118/77 HR: 85 SpO2 97% 2L O2. Standing 115/63 SpO2 93% RA. 2L O2 Celoron replaced at end of session.            Home Living Family/patient expects to be discharged to:: Private residence Living Arrangements: Alone Available Help at Discharge: Family;Friend(s);Available 24 hours/day Type of Home: House Home Access: Stairs to enter [employer] of Steps: 1 onto porch with grab bar   Home Layout: One level     Bathroom Shower/Tub: Chief Strategy Officer: Standard     Home Equipment: Cane - single point;Grab bars - tub/shower;Grab bars - toilet          Prior Functioning/Environment Prior Level of Function : Independent/Modified Independent;History of Falls (last six months)             OT Problem List: Decreased activity tolerance;Impaired balance (sitting and/or standing);Decreased strength      OT Treatment/Interventions: Self-care/ADL training;Therapeutic exercise;Therapeutic activities;Energy conservation;DME and/or AE instruction;Balance training;Patient/family education    OT Goals(Current goals can be found in the care plan section) Acute Rehab OT Goals Patient Stated Goal: to have hernia repair OT Goal Formulation: Patient unable to participate in goal setting Time For Goal Achievement: 04/25/23 Potential to Achieve Goals: Good  OT Frequency: Min 1X/week       AM-PAC OT "6 Clicks" Daily Activity     Outcome Measure Help from another person eating meals?: None Help from another person taking care of personal grooming?: None Help from another person toileting, which includes using toliet, bedpan, or urinal?: A Little Help from another person bathing (including washing, rinsing, drying)?: A Little Help from another person to put on and taking off regular upper body clothing?: A Little Help from  another person to put on and taking off regular lower body clothing?: A Little 6 Click Score: 20   End of Session Equipment Utilized During Treatment: Oxygen  Activity Tolerance: Treatment limited secondary to medical complications (Comment) (orthostatic BP) Patient left: in chair;with call bell/phone within reach  OT Visit Diagnosis: History of falling (Z91.81);Muscle weakness (generalized) (M62.81)                Time: 2951-8841 OT Time Calculation (min): 23 min Charges:  OT General Charges $OT Visit: 1 Visit OT Evaluation $OT Eval Moderate Complexity: 1 Mod  [employer], OTR/L,CBIS  Supplemental OT - [facility] and [facility] Secure Chat Preferred    Audri Kozub, Charisse March 04/11/2023, 10:58 AM

## 2023-04-12 ENCOUNTER — Encounter (HOSPITAL_COMMUNITY)
Admission: EM | Disposition: A | Discharge status: Skilled Nursing Facility | Payer: Self-pay | Source: Home / Self Care | Attending: Internal Medicine

## 2023-04-12 ENCOUNTER — Inpatient Hospital Stay (HOSPITAL_COMMUNITY): Payer: Medicare Other

## 2023-04-12 ENCOUNTER — Encounter (HOSPITAL_COMMUNITY): Payer: Self-pay

## 2023-04-12 ENCOUNTER — Other Ambulatory Visit: Payer: Self-pay

## 2023-04-12 DIAGNOSIS — K4091 Unilateral inguinal hernia, without obstruction or gangrene, recurrent: Secondary | ICD-10-CM | POA: Diagnosis not present

## 2023-04-12 DIAGNOSIS — K56609 Unspecified intestinal obstruction, unspecified as to partial versus complete obstruction: Secondary | ICD-10-CM | POA: Diagnosis not present

## 2023-04-12 HISTORY — PX: INGUINAL HERNIA REPAIR: SHX194

## 2023-04-12 SURGERY — REPAIR, HERNIA, INGUINAL, ADULT
Anesthesia: General | Laterality: Left

## 2023-04-12 MED ORDER — DEXAMETHASONE SODIUM PHOSPHATE 4 MG/ML IJ SOLN
INTRAMUSCULAR | Status: DC | PRN
  Administered 2023-04-12: 5 mg via INTRAVENOUS

## 2023-04-12 MED ORDER — ONDANSETRON HCL 4 MG/2ML IJ SOLN
INTRAMUSCULAR | Status: DC | PRN
  Administered 2023-04-12: 4 mg via INTRAVENOUS

## 2023-04-12 MED ORDER — ACETAMINOPHEN 10 MG/ML IV SOLN: 1000.0000 mg | Freq: Once | INTRAVENOUS | Status: DC | PRN

## 2023-04-12 MED ORDER — BUPIVACAINE HCL (PF) 0.25 % IJ SOLN
INTRAMUSCULAR | Status: DC | PRN
  Administered 2023-04-12: 10 mL

## 2023-04-12 MED ORDER — PHENYLEPHRINE 80 MCG/ML (10ML) SYRINGE FOR IV PUSH (FOR BLOOD PRESSURE SUPPORT)
PREFILLED_SYRINGE | INTRAVENOUS | Status: AC
  Filled 2023-04-12: qty 10

## 2023-04-12 MED ORDER — PHENYLEPHRINE 80 MCG/ML (10ML) SYRINGE FOR IV PUSH (FOR BLOOD PRESSURE SUPPORT)
PREFILLED_SYRINGE | INTRAVENOUS | Status: AC
  Filled 2023-04-12: qty 20

## 2023-04-12 MED ORDER — CHLORHEXIDINE GLUCONATE 0.12 % MT SOLN
15.0000 mL | Freq: Once | OROMUCOSAL | Status: AC
  Administered 2023-04-12: 15 mL via OROMUCOSAL

## 2023-04-12 MED ORDER — ACETAMINOPHEN 500 MG PO TABS
1000.0000 mg | ORAL_TABLET | Freq: Four times a day (QID) | ORAL | Status: DC
  Administered 2023-04-12 – 2023-04-19 (×24): 1000 mg via ORAL
  Filled 2023-04-12 (×24): qty 2

## 2023-04-12 MED ORDER — PROPOFOL 10 MG/ML IV BOLUS
INTRAVENOUS | Status: DC | PRN
  Administered 2023-04-12: 100 mg via INTRAVENOUS

## 2023-04-12 MED ORDER — OXYCODONE HCL 5 MG/5ML PO SOLN: 5.0000 mg | Freq: Once | ORAL | Status: DC | PRN

## 2023-04-12 MED ORDER — OXYCODONE HCL 5 MG PO TABS: 2.5000 mg | ORAL_TABLET | ORAL | Status: DC | PRN

## 2023-04-12 MED ORDER — FENTANYL CITRATE (PF) 100 MCG/2ML IJ SOLN
INTRAMUSCULAR | Status: DC | PRN
  Administered 2023-04-12: 50 ug via INTRAVENOUS

## 2023-04-12 MED ORDER — OXYCODONE HCL 5 MG PO TABS: 5.0000 mg | ORAL_TABLET | Freq: Once | ORAL | Status: DC | PRN

## 2023-04-12 MED ORDER — PHENYLEPHRINE HCL-NACL 20-0.9 MG/250ML-% IV SOLN
INTRAVENOUS | Status: DC | PRN
  Administered 2023-04-12: 20 ug/min via INTRAVENOUS

## 2023-04-12 MED ORDER — DROPERIDOL 2.5 MG/ML IJ SOLN: 0.6250 mg | Freq: Once | INTRAMUSCULAR | Status: DC | PRN

## 2023-04-12 MED ORDER — ENOXAPARIN SODIUM 40 MG/0.4ML IJ SOSY
40.0000 mg | PREFILLED_SYRINGE | INTRAMUSCULAR | Status: DC
  Administered 2023-04-13 – 2023-04-15 (×3): 40 mg via SUBCUTANEOUS
  Filled 2023-04-12 (×7): qty 0.4

## 2023-04-12 MED ORDER — KETAMINE HCL 50 MG/5ML IJ SOSY
PREFILLED_SYRINGE | INTRAMUSCULAR | Status: AC
  Filled 2023-04-12: qty 5

## 2023-04-12 MED ORDER — FENTANYL CITRATE (PF) 100 MCG/2ML IJ SOLN
INTRAMUSCULAR | Status: AC
  Filled 2023-04-12: qty 2

## 2023-04-12 MED ORDER — BUPIVACAINE HCL (PF) 0.25 % IJ SOLN
INTRAMUSCULAR | Status: DC | PRN
  Administered 2023-04-12: 15 mL via EPIDURAL

## 2023-04-12 MED ORDER — PROPOFOL 10 MG/ML IV BOLUS
INTRAVENOUS | Status: AC
Start: 2023-04-12 — End: ?
  Filled 2023-04-12: qty 20

## 2023-04-12 MED ORDER — GUAIFENESIN-DM 100-10 MG/5ML PO SYRP
5.0000 mL | ORAL_SOLUTION | Freq: Once | ORAL | Status: AC
  Administered 2023-04-12: 5 mL via ORAL
  Filled 2023-04-12: qty 10

## 2023-04-12 MED ORDER — 0.9 % SODIUM CHLORIDE (POUR BTL) OPTIME
TOPICAL | Status: DC | PRN
  Administered 2023-04-12: 500 mL

## 2023-04-12 MED ORDER — LACTATED RINGERS IV SOLN: INTRAVENOUS | Status: DC

## 2023-04-12 MED ORDER — ORAL CARE MOUTH RINSE: 15.0000 mL | Freq: Once | OROMUCOSAL | Status: AC

## 2023-04-12 MED ORDER — BUPIVACAINE HCL (PF) 0.25 % IJ SOLN
INTRAMUSCULAR | Status: AC
  Filled 2023-04-12: qty 30

## 2023-04-12 MED ORDER — FENTANYL CITRATE PF 50 MCG/ML IJ SOSY: 25.0000 ug | PREFILLED_SYRINGE | INTRAMUSCULAR | Status: DC | PRN

## 2023-04-12 MED ORDER — PHENYLEPHRINE HCL (PRESSORS) 10 MG/ML IV SOLN
INTRAVENOUS | Status: DC | PRN
  Administered 2023-04-12 (×3): 120 ug via INTRAVENOUS

## 2023-04-12 MED ORDER — LIDOCAINE HCL (CARDIAC) PF 100 MG/5ML IV SOSY
PREFILLED_SYRINGE | INTRAVENOUS | Status: DC | PRN
  Administered 2023-04-12: 60 mg via INTRAVENOUS

## 2023-04-12 MED ORDER — CEFAZOLIN SODIUM 1 G IJ SOLR
INTRAMUSCULAR | Status: AC
Start: 2023-04-12 — End: ?
  Filled 2023-04-12: qty 20

## 2023-04-12 SURGICAL SUPPLY — 45 items
APL PRP STRL LF DISP 70% ISPRP (MISCELLANEOUS) ×1
APL SKNCLS STERI-STRIP NONHPOA (GAUZE/BANDAGES/DRESSINGS) ×1
BAG COUNTER SPONGE SURGICOUNT (BAG) IMPLANT
BAG SPNG CNTER NS LX DISP (BAG)
BENZOIN TINCTURE PRP APPL 2/3 (GAUZE/BANDAGES/DRESSINGS) ×1 IMPLANT
BLADE HEX COATED 2.75 (ELECTRODE) ×1 IMPLANT
CHLORAPREP W/TINT 26 (MISCELLANEOUS) ×1 IMPLANT
COVER SURGICAL LIGHT HANDLE (MISCELLANEOUS) ×1 IMPLANT
DISSECTOR ROUND CHERRY 3/8 STR (MISCELLANEOUS) IMPLANT
DRAIN PENROSE 0.5X18 (DRAIN) IMPLANT
DRAPE LAPAROTOMY TRNSV 102X78 (DRAPES) ×1 IMPLANT
DRAPE UTILITY XL STRL (DRAPES) ×1 IMPLANT
DRSG TEGADERM 4X4.75 (GAUZE/BANDAGES/DRESSINGS) IMPLANT
ELECT REM PT RETURN 15FT ADLT (MISCELLANEOUS) ×1 IMPLANT
GAUZE 4X4 16PLY ~~LOC~~+RFID DBL (SPONGE) IMPLANT
GAUZE SPONGE 4X4 12PLY STRL (GAUZE/BANDAGES/DRESSINGS) ×1 IMPLANT
GLOVE BIO SURGEON STRL SZ7 (GLOVE) ×1 IMPLANT
GLOVE BIOGEL PI IND STRL 7.5 (GLOVE) ×1 IMPLANT
GOWN STRL REUS W/ TWL LRG LVL3 (GOWN DISPOSABLE) IMPLANT
GOWN STRL REUS W/TWL LRG LVL3 (GOWN DISPOSABLE)
KIT BASIN OR (CUSTOM PROCEDURE TRAY) ×1 IMPLANT
KIT TURNOVER KIT A (KITS) IMPLANT
MARKER SKIN DUAL TIP RULER LAB (MISCELLANEOUS) ×1 IMPLANT
MESH ULTRAPRO 3X6 7.6X15CM (Mesh General) IMPLANT
NDL HYPO 25X1 1.5 SAFETY (NEEDLE) ×1 IMPLANT
NEEDLE HYPO 25X1 1.5 SAFETY (NEEDLE) ×1 IMPLANT
PACK GENERAL/GYN (CUSTOM PROCEDURE TRAY) ×1 IMPLANT
SPIKE FLUID TRANSFER (MISCELLANEOUS) ×1 IMPLANT
STRIP CLOSURE SKIN 1/2X4 (GAUZE/BANDAGES/DRESSINGS) ×1 IMPLANT
SUT MNCRL AB 4-0 PS2 18 (SUTURE) ×1 IMPLANT
SUT NOVA NAB GS-21 0 18 T12 DT (SUTURE) IMPLANT
SUT PROLENE 2 0 SH DA (SUTURE) IMPLANT
SUT SILK 3 0 (SUTURE) ×1
SUT SILK 3-0 18XBRD TIE 12 (SUTURE) IMPLANT
SUT VIC AB 2-0 CT2 27 (SUTURE) IMPLANT
SUT VIC AB 2-0 SH 27 (SUTURE) ×2
SUT VIC AB 2-0 SH 27X BRD (SUTURE) ×1 IMPLANT
SUT VIC AB 2-0 SH 27XBRD (SUTURE) IMPLANT
SUT VIC AB 3-0 SH 27 (SUTURE) ×2
SUT VIC AB 3-0 SH 27X BRD (SUTURE) ×1 IMPLANT
SUT VICRYL 0 27 CT2 27 ABS (SUTURE) IMPLANT
SUT VICRYL 0 UR6 27IN ABS (SUTURE) IMPLANT
SYR 20ML LL LF (SYRINGE) ×1 IMPLANT
TOWEL OR 17X26 10 PK STRL BLUE (TOWEL DISPOSABLE) ×1 IMPLANT
TOWEL OR NON WOVEN STRL DISP B (DISPOSABLE) ×1 IMPLANT

## 2023-04-12 NOTE — Progress Notes (Signed)
PROGRESS NOTE    Kayla Hurley  ZOX:[id number] DOB: [birth year] DOA: 04/09/2023 PCP: Pcp, No    Brief Narrative:  88 year old with history of COPD and chronic hypoxemia on 2 L oxygen, macular degeneration, rheumatoid arthritis, hypertension, hyperlipidemia and essential tremors, hypothyroidism who had fall at home without prodromal symptoms.  She felt like she had a flu.  She was also complaining of left lower quadrant pain due to chronic left inguinal hernia that she used to reduce herself however last night she could not do it.  She also had multiple episodes of vomiting.  Initially hemodynamically stable, on 2 L oxygen.  Found to have incarcerated left inguinal hernia with a small bowel loop.  Mild ascites.  Patient was given mild sedation and hernia was reduced at the bedside by surgery and patient admitted to the hospital.  Subjective:  Patient seen and examined.  She had some nausea.  She is passing flatus.  No bowel movement yet.  She has not eaten anything.  Looking forward for surgery and she is hoping it will go well. Surgery team has explained to her and family about the upcoming surgery. No other overnight events.   Assessment & Plan:   Incarcerated left inguinal hernia with small bowel obstruction: Hernia reduced in the emergency room by surgery. Currently hemodynamically stabilizing.  On maintenance IV fluids. Due to patient's frailty and debility along with multiple medical problems, high risk of recurrence and bowel obstruction surgical correction was recommended.  She is scheduled for surgery today.   Fall and soft tissue injuries: Fairly stable.  Skeletal survey was negative.  Chronic medical issues COPD with asthma, currently not on any exacerbation.  Resume Breo.  Resume bronchodilators. Hypothyroidism: Resume home dose of levothyroxine. Electrolytes: Replaced and adequate. Glaucoma: On timolol drops.          DVT prophylaxis: SCDs Start: 04/09/23  1450   Code Status: Full code Family Communication: None today.  Communicated by surgery team. Disposition Plan: Status is: Inpatient Remains inpatient appropriate because: Inpatient surgery planned     Consultants:  General surgery  Procedures:  None  Antimicrobials:  None     Objective: Vitals:   04/11/23 1939 04/12/23 0529 04/12/23 1031 04/12/23 1055  BP: (!) 120/54 (!) 141/71 134/65   Pulse: 83 83 76   Resp: 12 13 14    Temp: 98.7 F (37.1 C) 98.9 F (37.2 C) 98.2 F (36.8 C)   TempSrc: Oral Oral    SpO2: 90% 91% 94%   Weight:    59 kg  Height:    5\' 1"  (1.549 m)    Intake/Output Summary (Last 24 hours) at 04/12/2023 1132 Last data filed at 04/12/2023 0648 Gross per 24 hour  Intake 20 ml  Output 450 ml  Net -430 ml   Filed Weights   04/09/23 0914 04/12/23 1055  Weight: 59 kg 59 kg    Examination:  General exam: Calm and comfortable.  Appropriately anxious.  She is frail and thinly built. Patient does have some ecchymosis on this anterior chest wall. Respiratory system: Clear to auscultation. Respiratory effort normal. Cardiovascular system: S1 & S2 heard, RRR. No pedal edema. Gastrointestinal system: Abdomen is nondistended, soft , mildly tender on deep palpation.  Bowel sounds are present.  She has multiple previous scars that are nontender.  No palpable hernia.    Data Reviewed: I have personally reviewed following labs and imaging studies  CBC: Recent Labs  Lab 04/09/23 1020 04/10/23 0443  WBC 11.4* 8.6  NEUTROABS 9.2*  --   HGB 13.6 13.2  HCT 39.3 39.4  MCV 93.6 96.8  PLT 127* 137*   Basic Metabolic Panel: Recent Labs  Lab 04/09/23 1020 04/09/23 1712 04/10/23 0443  NA 139  --  136  K 3.2*  --  3.6  CL 93*  --  97*  CO2 31  --  27  GLUCOSE 137*  --  84  BUN 22  --  18  CREATININE 0.59  --  0.69  CALCIUM 9.9  --  9.0  MG  --  2.1  --   PHOS  --  3.6  --    GFR: Estimated Creatinine Clearance: 41.7 mL/min (by C-G formula  based on SCr of 0.69 mg/dL). Liver Function Tests: Recent Labs  Lab 04/09/23 1020 04/10/23 0443  AST 26 20  ALT 14 13  ALKPHOS 45 39  BILITOT 1.0 1.0  PROT 6.5 5.9*  ALBUMIN 3.4* 3.1*   Recent Labs  Lab 04/09/23 1020  LIPASE 47   No results for input(s): "AMMONIA" in the last 168 hours. Coagulation Profile: No results for input(s): "INR", "PROTIME" in the last 168 hours. Cardiac Enzymes: No results for input(s): "CKTOTAL", "CKMB", "CKMBINDEX", "TROPONINI" in the last 168 hours. BNP (last 3 results) No results for input(s): "PROBNP" in the last 8760 hours. HbA1C: No results for input(s): "HGBA1C" in the last 72 hours. CBG: No results for input(s): "GLUCAP" in the last 168 hours. Lipid Profile: No results for input(s): "CHOL", "HDL", "LDLCALC", "TRIG", "CHOLHDL", "LDLDIRECT" in the last 72 hours. Thyroid Function Tests: Recent Labs    04/09/23 1712  TSH 4.043   Anemia Panel: No results for input(s): "VITAMINB12", "FOLATE", "FERRITIN", "TIBC", "IRON", "RETICCTPCT" in the last 72 hours. Sepsis Labs: Recent Labs  Lab 04/09/23 1020 04/09/23 1430  LATICACIDVEN 1.1 1.0    Recent Results (from the past 240 hour(s))  Resp panel by RT-PCR (RSV, Flu A&B, Covid) Anterior Nasal Swab     Status: None   Collection Time: 04/09/23 10:12 AM   Specimen: Anterior Nasal Swab  Result Value Ref Range Status   SARS Coronavirus 2 by RT PCR NEGATIVE NEGATIVE Final    Comment: (NOTE) SARS-CoV-2 target nucleic acids are NOT DETECTED.  The SARS-CoV-2 RNA is generally detectable in upper respiratory specimens during the acute phase of infection. The lowest concentration of SARS-CoV-2 viral copies this assay can detect is 138 copies/mL. A negative result does not preclude SARS-Cov-2 infection and should not be used as the sole basis for treatment or other patient management decisions. A negative result may occur with  improper specimen collection/handling, submission of specimen  other than nasopharyngeal swab, presence of viral mutation(s) within the areas targeted by this assay, and inadequate number of viral copies(<138 copies/mL). A negative result must be combined with clinical observations, patient history, and epidemiological information. The expected result is Negative.  Fact Sheet for Patients:  BloggerCourse.com  Fact Sheet for Healthcare Providers:  SeriousBroker.it  This test is no t yet approved or cleared by the [place] FDA and  has been authorized for detection and/or diagnosis of SARS-CoV-2 by FDA under an Emergency Use Authorization (EUA). This EUA will remain  in effect (meaning this test can be used) for the duration of the COVID-19 declaration under Section 564(b)(1) of the Act, 21 U.S.C.section 360bbb-3(b)(1), unless the authorization is terminated  or revoked sooner.       Influenza A by PCR NEGATIVE NEGATIVE Final   Influenza B by PCR NEGATIVE  NEGATIVE Final    Comment: (NOTE) The Xpert Xpress SARS-CoV-2/FLU/RSV plus assay is intended as an aid in the diagnosis of influenza from Nasopharyngeal swab specimens and should not be used as a sole basis for treatment. Nasal washings and aspirates are unacceptable for Xpert Xpress SARS-CoV-2/FLU/RSV testing.  Fact Sheet for Patients: BloggerCourse.com  Fact Sheet for Healthcare Providers: SeriousBroker.it  This test is not yet approved or cleared by the [place] FDA and has been authorized for detection and/or diagnosis of SARS-CoV-2 by FDA under an Emergency Use Authorization (EUA). This EUA will remain in effect (meaning this test can be used) for the duration of the COVID-19 declaration under Section 564(b)(1) of the Act, 21 U.S.C. section 360bbb-3(b)(1), unless the authorization is terminated or revoked.     Resp Syncytial Virus by PCR NEGATIVE NEGATIVE Final     Comment: (NOTE) Fact Sheet for Patients: BloggerCourse.com  Fact Sheet for Healthcare Providers: SeriousBroker.it  This test is not yet approved or cleared by the [place] FDA and has been authorized for detection and/or diagnosis of SARS-CoV-2 by FDA under an Emergency Use Authorization (EUA). This EUA will remain in effect (meaning this test can be used) for the duration of the COVID-19 declaration under Section 564(b)(1) of the Act, 21 U.S.C. section 360bbb-3(b)(1), unless the authorization is terminated or revoked.  Performed at [facility], [address] [place], [place] [address]   Blood culture (routine x 2)     Status: None (Preliminary result)   Collection Time: 04/09/23 10:20 AM   Specimen: BLOOD  Result Value Ref Range Status   Specimen Description   Final    BLOOD SITE NOT SPECIFIED Performed at [facility], [address] [place], [place] [address]    Special Requests   Final    BOTTLES DRAWN AEROBIC AND ANAEROBIC Blood Culture adequate volume Performed at [facility], [address], [place], [place] [address]    Culture   Final    NO GROWTH 3 DAYS Performed at [facility], [address] [place], [place] [address]    Report Status PENDING  Incomplete  Blood culture (routine x 2)     Status: None (Preliminary result)   Collection Time: 04/09/23 10:30 AM   Specimen: BLOOD  Result Value Ref Range Status   Specimen Description   Final    BLOOD SITE NOT SPECIFIED Performed at [facility], [address], [place], [place] [address]    Special Requests   Final    BOTTLES DRAWN AEROBIC AND ANAEROBIC Blood Culture adequate volume Performed at [facility], [address], [place], [place] [address]    Culture   Final    NO GROWTH 3 DAYS Performed at [facility], [address]  [place], [place] [address]    Report Status PENDING  Incomplete  Surgical pcr screen     Status: None   Collection Time: 04/11/23  9:35 PM   Specimen: Nasal Mucosa; Nasal Swab  Result Value Ref Range Status   MRSA, PCR NEGATIVE NEGATIVE Final   Staphylococcus aureus NEGATIVE NEGATIVE Final    Comment: (NOTE) The Xpert SA Assay (FDA approved for NASAL specimens in patients 58 years of age and older), is one component of a comprehensive surveillance program. It is not intended to diagnose infection nor to guide or monitor treatment. Performed at [facility], [address], [place], [place] [address]  Radiology Studies: DG Abd Portable 1V  Result Date: 04/10/2023 CLINICAL DATA:  Small-bowel obstruction EXAM: PORTABLE ABDOMEN - 1 VIEW COMPARISON:  Procedure description CT abdomen/pelvis 1 day prior FINDINGS: Findings diffuse gaseous distention of the small bowel is noted with loops measuring up to 3.7 cm, grossly similar to the prior CT. There is no definite free intraperitoneal air. There is no abnormal soft tissue calcification. Cholecystectomy clips are noted. There is no acute osseous abnormality. IMPRESSION: Gaseous distention of the small bowel is grossly similar to the prior CT, consistent with small-bowel obstruction. Electronically Signed   By: Lesia Hausen M.D.   On: 04/10/2023 14:50        Scheduled Meds:  [MAR Hold] cholecalciferol  5,000 Units Oral Daily   [MAR Hold] fluticasone furoate-vilanterol  1 puff Inhalation Daily   [MAR Hold] folic acid  2 mg Oral Daily   [MAR Hold] gabapentin  100 mg Oral QHS   [MAR Hold] levothyroxine  50 mcg Oral QAC breakfast   [MAR Hold] pantoprazole (PROTONIX) IV  40 mg Intravenous Q24H   [MAR Hold] primidone  25 mg Oral QHS   [MAR Hold] timolol  1 drop Both Eyes BID   Continuous Infusions:  [MAR Hold]  ceFAZolin (ANCEF) IV     lactated ringers 10 mL/hr at 04/12/23 1044     LOS: 3 days    Time spent: 40  minutes    Dorcas Carrow, MD [facility]

## 2023-04-12 NOTE — Anesthesia Procedure Notes (Signed)
Procedure Name: LMA Insertion Date/Time: 04/12/2023 1:01 PM  Performed by: Nathen May, CRNAPre-anesthesia Checklist: Patient identified, Emergency Drugs available, Suction available and Patient being monitored Patient Re-evaluated:Patient Re-evaluated prior to induction Oxygen Delivery Method: Circle System Utilized Preoxygenation: Pre-oxygenation with 100% oxygen Induction Type: IV induction Ventilation: Mask ventilation without difficulty LMA: LMA inserted LMA Size: 4.0 Number of attempts: 1 Airway Equipment and Method: Bite block Placement Confirmation: positive ETCO2 Tube secured with: Tape Dental Injury: Teeth and Oropharynx as per pre-operative assessment

## 2023-04-12 NOTE — Progress Notes (Signed)
Day of Surgery   Subjective/Chief Complaint: Patient is passing flatus No BM No nausea   Objective: Vital signs in last 24 hours: Temp:  [97.5 F (36.4 C)-98.9 F (37.2 C)] 98.9 F (37.2 C) (11/06 0529) Pulse Rate:  [74-83] 83 (11/06 0529) Resp:  [12-16] 13 (11/06 0529) BP: (120-141)/(54-71) 141/71 (11/06 0529) SpO2:  [90 %-93 %] 91 % (11/06 0529) Last BM Date : 04/08/23  Intake/Output from previous day: 11/05 0701 - 11/06 0700 In: 20 [P.O.:20] Out: 450 [Urine:450] Intake/Output this shift: No intake/output data recorded.  Left inguinal hernia - reduced No abdominal tenderness or distention  Lab Results:  Recent Labs    04/09/23 1020 04/10/23 0443  WBC 11.4* 8.6  HGB 13.6 13.2  HCT 39.3 39.4  PLT 127* 137*   BMET Recent Labs    04/09/23 1020 04/10/23 0443  NA 139 136  K 3.2* 3.6  CL 93* 97*  CO2 31 27  GLUCOSE 137* 84  BUN 22 18  CREATININE 0.59 0.69  CALCIUM 9.9 9.0   PT/INR No results for input(s): "LABPROT", "INR" in the last 72 hours. ABG No results for input(s): "PHART", "HCO3" in the last 72 hours.  Invalid input(s): "PCO2", "PO2"  Studies/Results: DG Abd Portable 1V  Result Date: 04/10/2023 CLINICAL DATA:  Small-bowel obstruction EXAM: PORTABLE ABDOMEN - 1 VIEW COMPARISON:  Procedure description CT abdomen/pelvis 1 day prior FINDINGS: Findings diffuse gaseous distention of the small bowel is noted with loops measuring up to 3.7 cm, grossly similar to the prior CT. There is no definite free intraperitoneal air. There is no abnormal soft tissue calcification. Cholecystectomy clips are noted. There is no acute osseous abnormality. IMPRESSION: Gaseous distention of the small bowel is grossly similar to the prior CT, consistent with small-bowel obstruction. Electronically Signed   By: Lesia Hausen M.D.   On: 04/10/2023 14:50    Anti-infectives: Anti-infectives (From admission, onward)    Start     Dose/Rate Route Frequency Ordered Stop    04/12/23 1100  ceFAZolin (ANCEF) IVPB 2g/100 mL premix        2 g 200 mL/hr over 30 Minutes Intravenous  Once 04/11/23 1614         Assessment/Plan: Incarcerated LIH, s/p reduction in ED  - exam remains benign though continued bloating and no bowel function -plain film 11/4 reveals several loops of dilated bowel still, similar in appearance to scout film of CT scan . -d/w medical team.  Feels she is elderly and frail, but no further work up needed prior to possible surgical fixation of this hernia. -plan for OR today.  Left inguinal hernia repair with mesh.  The surgical procedure has been discussed with the patient.  Potential risks, benefits, alternative treatments, and expected outcomes have been explained.  All of the patient's questions at this time have been answered.  The likelihood of reaching the patient's treatment goal is good.  The patient understand the proposed surgical procedure and wishes to proceed.     FEN - NPO, x sips of liquids from floor, VTE - ok for chemical prophylaxis from our standpoint ID - none currently needed.   COPD - supplemental O2 via [place] Multiple falls Hypothyroidism HLD Hypokalemia Glaucoma  LOS: 3 days    Kayla Hurley 04/12/2023

## 2023-04-12 NOTE — Progress Notes (Signed)
Per patient request surgical plans for today were discussed with son Denyce Robert by me via telephone

## 2023-04-12 NOTE — Discharge Instructions (Signed)
[facility]  UMBILICAL OR INGUINAL HERNIA REPAIR: POST OP INSTRUCTIONS  Always review your discharge instruction sheet given to you by the facility where your surgery was performed. IF YOU HAVE DISABILITY OR FAMILY LEAVE FORMS, YOU MUST BRING THEM TO THE OFFICE FOR PROCESSING.   DO NOT GIVE THEM TO YOUR DOCTOR.  A  prescription for pain medication may be given to you upon discharge.  Take your pain medication as prescribed, if needed.  If narcotic pain medicine is not needed, then you may take acetaminophen (Tylenol), naprosyn (Alleve) or ibuprofen (Advil) as needed. Take your usually prescribed medications unless otherwise directed. If you need a refill on your pain medication, please contact your pharmacy.  They will contact our office to request authorization. Prescriptions will not be filled after 5 pm or on week-ends. You should follow a light diet the first 24 hours after arrival home, such as soup and crackers, etc.  Be sure to include lots of fluids daily.  Resume your normal diet the day after surgery. Most patients will experience some swelling and bruising around the umbilicus or in the groin and scrotum.  Ice packs and reclining will help.  Swelling and bruising can take several days to resolve.  It is common to experience some constipation if taking pain medication after surgery.  Increasing fluid intake and taking a stool softener (such as Colace) will usually help or prevent this problem from occurring.  A mild laxative (Milk of Magnesia or Miralax) should be taken according to package directions if there are no bowel movements after 48 hours. Unless discharge instructions indicate otherwise, you may remove your bandages 48 hours after surgery, and you may shower at that time.  You may have steri-strips (small skin tapes) in place directly over the incision.  These strips should be left on the skin for 7-10 days and will come off on their own.  If your surgeon used  skin glue on the incision, you may shower in 24 hours.  The glue will flake off over the next 2-3 weeks.  Any sutures or staples will be removed at the office during your follow-up visit. ACTIVITIES:  You may resume regular (light) daily activities beginning the next day--such as daily self-care, walking, climbing stairs--gradually increasing activities as tolerated.  You may have sexual intercourse when it is comfortable.  Refrain from any heavy lifting or straining until approved by your doctor. You may drive when you are no longer taking prescription pain medication, you can comfortably wear a seatbelt, and you can safely maneuver your car and apply brakes. RETURN TO WORK:  __________________________________________________________ You should see your doctor in the office for a follow-up appointment approximately 2-3 weeks after your surgery.  Make sure that you call for this appointment within a day or two after you arrive home to insure a convenient appointment time. OTHER INSTRUCTIONS:  __________________________________________________________________________________________________________________________________________________________________________________________  WHEN TO CALL YOUR DOCTOR: Fever over 101.0 Inability to urinate Nausea and/or vomiting Extreme swelling or bruising Continued bleeding from incision. Increased pain, redness, or drainage from the incision  The clinic staff is available to answer your questions during regular business hours.  Please don't hesitate to call and ask to speak to one of the nurses for clinical concerns.  If you have a medical emergency, go to the nearest emergency room or call 911.  A surgeon from [facility] is always on call at the hospital   [address], [address], [place], [place]    [address] ?  [address] [address], [place],    [address] [phone] ? [phone] ? FAX [phone] Web site:  [email or web address]  

## 2023-04-12 NOTE — Plan of Care (Signed)
  Problem: Education: Goal: Knowledge of General Education information will improve Description: Including pain rating scale, medication(s)/side effects and non-pharmacologic comfort measures Outcome: Progressing   Problem: Clinical Measurements: Goal: Ability to maintain clinical measurements within normal limits will improve Outcome: Progressing Goal: Will remain free from infection Outcome: Progressing Goal: Diagnostic test results will improve Outcome: Progressing Goal: Respiratory complications will improve Outcome: Progressing Goal: Cardiovascular complication will be avoided Outcome: Progressing   Problem: Activity: Goal: Risk for activity intolerance will decrease Outcome: Progressing   Problem: Nutrition: Goal: Adequate nutrition will be maintained Outcome: Progressing   Problem: Coping: Goal: Level of anxiety will decrease Outcome: Progressing   Problem: Elimination: Goal: Will not experience complications related to bowel motility Outcome: Progressing Goal: Will not experience complications related to urinary retention Outcome: Progressing   Problem: Pain Management: Goal: General experience of comfort will improve Outcome: Progressing   Problem: Safety: Goal: Ability to remain free from injury will improve Outcome: Progressing   Problem: Skin Integrity: Goal: Risk for impaired skin integrity will decrease Outcome: Progressing   Problem: Education: Goal: Required Educational Video(s) Outcome: Not Applicable

## 2023-04-12 NOTE — Anesthesia Procedure Notes (Signed)
Anesthesia Regional Block: TAP block   Pre-Anesthetic Checklist: , timeout performed,  Correct Patient, Correct Site, Correct Laterality,  Correct Procedure, Correct Position, site marked,  Risks and benefits discussed,  Surgical consent,  Pre-op evaluation,  At surgeon's request and post-op pain management  Laterality: Left  Prep: chloraprep       Needles:  Injection technique: Single-shot  Needle Type: Echogenic Stimulator Needle     Needle Length: 9cm  Needle Gauge: 21     Additional Needles:   Procedures:,,,, ultrasound used (permanent image in chart),,    Narrative:  Start time: 04/12/2023 12:40 PM End time: 04/12/2023 12:45 PM Injection made incrementally with aspirations every 5 mL.  Performed by: Personally  Anesthesiologist: Ponce de Leon Nation, MD  Additional Notes: Discussed risks and benefits of the nerve block in detail, including but not limited vascular injury, permanent nerve damage and infection.   Patient tolerated the procedure well. Local anesthetic introduced in an incremental fashion under minimal resistance after negative aspirations. No paresthesias were elicited. After completion of the procedure, no acute issues were identified and patient continued to be monitored by RN.

## 2023-04-12 NOTE — Transfer of Care (Signed)
Immediate Anesthesia Transfer of Care Note  Patient: Kayla Hurley  Procedure(s) Performed: OPEN REPAIR RECURRENT LEFT INGUINAL HERNIA AND OPEN REPAIR LEFT FEMORAL HERNIA WITH MESH (Left)  Patient Location: PACU  Anesthesia Type:General and Regional  Level of Consciousness: drowsy  Airway & Oxygen Therapy: Patient Spontanous Breathing and Patient connected to nasal cannula oxygen  Post-op Assessment: Report given to RN and Post -op Vital signs reviewed and stable  Post vital signs: Reviewed and stable  Last Vitals:  Vitals Value Taken Time  BP 148/58 04/12/23 1501  Temp    Pulse 74 04/12/23 1505  Resp 17 04/12/23 1505  SpO2 100 % 04/12/23 1505  Vitals shown include unfiled device data.  Last Pain:  Vitals:   04/12/23 1055  TempSrc:   PainSc: 0-No pain      Patients Stated Pain Goal: 0 (04/10/23 1006)  Complications: No notable events documented.

## 2023-04-12 NOTE — Op Note (Addendum)
Indications: The patient presented with a history of an incarcerated left inguinal hernia containing small bowel.  The ED physician was able to reduce the hernia and the patient has regained some bowel function.  However, she continues to have discomfort in the left groin so she presents now for hernia repair.  She has had previous bilateral inguinal hernia repairs, but it is unclear whether she has mesh in place.  Pre-operative Diagnosis: Incarcerated recurrent left inguinal hernia Post-operative Diagnosis: Recurrent left inguinal hernia, incarcerated left femoral hernia Surgeon: Wynona Luna   Assistants: Dr. Carman Ching (experienced assistant required due to unusual anatomy and difficulty with exposure)  Anesthesia: General LMA anesthesia and tap block  ASA Class: 3  Procedure Details  The patient was seen again in the Holding Room. The risks, benefits, complications, treatment options, and expected outcomes were discussed with the patient. The possibilities of reaction to medication, pulmonary aspiration, perforation of viscus, bleeding, recurrent infection, the need for additional procedures, and development of a complication requiring transfusion or further operation were discussed with the patient and/or family. The likelihood of success in repairing the hernia and returning the patient to their previous functional status is good.  There was concurrence with the proposed plan, and informed consent was obtained. The site of surgery was properly noted/marked. The patient was taken to the Operating Room, identified as Kayla Hurley, and the procedure verified as left inguinal hernia repair. A Time Out was held and the above information confirmed.  The patient was placed in the supine position and underwent induction of anesthesia. The lower abdomen and groin was prepped with Chloraprep and draped in the standard fashion, and 0.25% Marcaine with epinephrine was used to anesthetize the  skin over the previous incision.  An oblique incision was made through her old scar.  There is an area of firmness underneath the old incision.  Dissection was carried down through the subcutaneous tissue with cautery.   We encountered a firm tubular structure in the subcutaneous tissues measuring 1 x 3 cm.  This appeared to be a foreign body.  We excised this and sent this for pathologic examination.  We continued dissecting deeper.  It became apparent that there previous incision was lower than typical for an inguinal hernia.  We were actually below the inguinal ligament.  The femoral vessels are palpated and remained intact.  We extended our incision superiorly.  I was able to dissect down to the external oblique fascia.  We opened the external oblique fascia along the direction of its fibers to the pubic tubercle.  Some previously placed mesh was identified medially.  However there was a 1 cm defect laterally just above the shelving edge.  There is a small amount of protruding preperitoneal fat.  We dissected this free and reduced it.  This defect was primarily closed with figure-of-eight #1 Novafil sutures.   We continued exploring the groin, as I did not feel that the small recurrent inguinal hernia explain her symptoms.  In the space between the inguinal incision and the femoral vessels, we encountered a hernia sac approximately 3 cm in diameter containing some dusky appearing fat.  We dissected around this completely.  This appears to be an incarcerated femoral hernia containing some omentum.  We open the hernia sac and did not identify any small bowel.  We amputated the hernia sac containing the infarcted omentum.  The neck of the hernia sac was ligated with 2-0 Vicryl.  We reduced this through the  femoral defect.  I then took a 3 inch x 6 inch piece of ultra Pro mesh.  I cut a 1 inch strip which was rolled into a tight roll that was secured with 2-0 Vicryl.  We placed this in the femoral canal and  attached it to the surrounding tissues with 2-0 Vicryl.  This served to plug the loose femoral canal.  We closed some subcutaneous tissue over the mesh plug with multiple 3-0 Vicryl sutures.  We closed the tissue over the femoral vessels inferiorly with 3-0 Vicryl.  4-0 Monocryl was used to close the skin in subcuticular fashion.  Benzoin and steri-strips were used to seal the incision.  A clean dressing was applied.  The patient was then extubated and brought to the recovery room in stable condition.  All sponge, instrument, and needle counts were correct prior to closure and at the conclusion of the case.   Estimated Blood Loss: Minimal                 Complications: None; patient tolerated the procedure well.         Disposition: PACU - hemodynamically stable.         Condition: stable  Wilmon Arms. Corliss Skains, MD, [facility]  General Surgery   04/12/2023 3:10 PM

## 2023-04-12 NOTE — Anesthesia Preprocedure Evaluation (Addendum)
Anesthesia Evaluation  Patient identified by MRN, date of birth, ID band Patient awake    Reviewed: Allergy & Precautions, H&P , NPO status , Patient's Chart, lab work & pertinent test results  Airway Mallampati: II  TM Distance: >3 FB Neck ROM: Full    Dental no notable dental hx. (+) Upper Dentures   Pulmonary asthma , COPD, former smoker   Pulmonary exam normal breath sounds clear to auscultation       Cardiovascular hypertension, Normal cardiovascular exam Rhythm:Regular Rate:Normal     Neuro/Psych  PSYCHIATRIC DISORDERS Anxiety     negative neurological ROS     GI/Hepatic negative GI ROS, Neg liver ROS,,,  Endo/Other  Hypothyroidism    Renal/GU negative Renal ROS  negative genitourinary   Musculoskeletal  (+) Arthritis ,    Abdominal   Peds negative pediatric ROS (+)  Hematology negative hematology ROS (+)   Anesthesia Other Findings   Reproductive/Obstetrics negative OB ROS                             Anesthesia Physical Anesthesia Plan  ASA: 3  Anesthesia Plan: General   Post-op Pain Management:    Induction: Intravenous  PONV Risk Score and Plan: Ondansetron and Dexamethasone  Airway Management Planned: LMA  Additional Equipment:   Intra-op Plan:   Post-operative Plan: Extubation in OR  Informed Consent: I have reviewed the patients History and Physical, chart, labs and discussed the procedure including the risks, benefits and alternatives for the proposed anesthesia with the patient or authorized representative who has indicated his/her understanding and acceptance.     Dental advisory given  Plan Discussed with: CRNA  Anesthesia Plan Comments:        Anesthesia Quick Evaluation

## 2023-04-12 NOTE — Progress Notes (Signed)
[facility] Pre-op Review 1.Surgical orders: Consent orders: In EPIC Consent signed: Signed Patient alert and oriented:  A/O with some confusion Antibiotic:  Ancef OCTOR Pre-meds:  2.  Pre-procedure checklist completed:  Request floor RN to complete  3.  NPO:  Since MN  4.  CHG Bath completed:  Request floor RN to complete      Belongings removed and placed in clean gown:  5.  Labs Performed: CBC    04/10/23  Abnormal CMP/BMP   04/10/23  Abnormal PT/INR  NA HA1C   NA Type and Screen NA Surgical PCR:  NA Pregnancy:  NA EKG:    04/09/23 Chest x-ray:  04/09/23   6.  Recent H&P or progress note if inpatient: 04/10/23  7.  Language Barrier:  None  8.  Vital Signs:  Oxygen:  RA Tele:   Yes Can the patient travel without Tele:  Floor RN to request order to transport off Tele  9.  Medications: Cardiac Drips:  NA Pain Medications: Fentanyl 0312 Beta Blocker:   NA Anticoagulants:  NA GLP1:   NA  10. IV access:   22 R FA  11. Diabetic:     NA

## 2023-04-13 ENCOUNTER — Encounter (HOSPITAL_COMMUNITY): Payer: Self-pay | Admitting: Surgery

## 2023-04-13 DIAGNOSIS — K56609 Unspecified intestinal obstruction, unspecified as to partial versus complete obstruction: Secondary | ICD-10-CM | POA: Diagnosis not present

## 2023-04-13 LAB — SURGICAL PATHOLOGY

## 2023-04-13 MED ORDER — HYDROCHLOROTHIAZIDE 25 MG PO TABS
25.0000 mg | ORAL_TABLET | Freq: Every day | ORAL | Status: DC
  Administered 2023-04-13 – 2023-04-19 (×7): 25 mg via ORAL
  Filled 2023-04-13 (×7): qty 1

## 2023-04-13 MED ORDER — POLYVINYL ALCOHOL 1.4 % OP SOLN
1.0000 [drp] | OPHTHALMIC | Status: DC | PRN
  Administered 2023-04-14: 1 [drp] via OPHTHALMIC
  Filled 2023-04-13: qty 15

## 2023-04-13 MED ORDER — DOCUSATE SODIUM 100 MG PO CAPS
100.0000 mg | ORAL_CAPSULE | Freq: Two times a day (BID) | ORAL | Status: DC
  Administered 2023-04-13 – 2023-04-19 (×10): 100 mg via ORAL
  Filled 2023-04-13 (×13): qty 1

## 2023-04-13 MED ORDER — PANTOPRAZOLE SODIUM 40 MG PO TBEC
40.0000 mg | DELAYED_RELEASE_TABLET | Freq: Every day | ORAL | Status: DC
  Administered 2023-04-13 – 2023-04-19 (×7): 40 mg via ORAL
  Filled 2023-04-13 (×7): qty 1

## 2023-04-13 MED ORDER — POTASSIUM CHLORIDE CRYS ER 10 MEQ PO TBCR
10.0000 meq | EXTENDED_RELEASE_TABLET | Freq: Two times a day (BID) | ORAL | Status: DC
  Administered 2023-04-13 – 2023-04-19 (×13): 10 meq via ORAL
  Filled 2023-04-13 (×16): qty 1

## 2023-04-13 MED ORDER — SALSALATE 750 MG PO TABS
1500.0000 mg | ORAL_TABLET | Freq: Two times a day (BID) | ORAL | Status: DC
  Administered 2023-04-13 – 2023-04-19 (×12): 1500 mg via ORAL
  Filled 2023-04-13 (×16): qty 2

## 2023-04-13 NOTE — Progress Notes (Signed)
PROGRESS NOTE    Kayla Hurley  ZOX:[id number] DOB: [birth year] DOA: 04/09/2023 PCP: Pcp, No    Brief Narrative:  88 year old with history of COPD and chronic hypoxemia on 2 L oxygen, macular degeneration, rheumatoid arthritis, hypertension, hyperlipidemia and essential tremors, hypothyroidism who had fall at home without prodromal symptoms.  She felt like she had flu.  She was also complaining of left lower quadrant pain due to chronic left inguinal hernia that she used to reduce herself however last night she could not do it.  She also had multiple episodes of vomiting.  Initially hemodynamically stable, on 2 L oxygen.  Found to have incarcerated left inguinal hernia with a small bowel loop.  Mild ascites.  Patient was given mild sedation and hernia was reduced at the bedside by surgery and patient admitted to the hospital.  Patient underwent left inguinal and femoral hernia repair.  Subjective:  Patient seen and examined.  Pain is mild to moderate.  Denies any nausea vomiting.  She wants to try some soft food.  She had bowel movement that was firm. She is trying to avoid the straining.  Will add some stool softener. She lives alone.  She may have to go to a rehab for recovery.   Assessment & Plan:   Incarcerated left inguinal hernia with small bowel obstruction: Hernia reduced in the ER.  Continue to have pain and high risk of recurrence.  Underwent exploration, inguinal hernia and femoral hernia repair on the left side.  Encourage mobility good pain control.  Stool softener.  PT OT.  Advance diet.  Refer to SNF for short-term rehab.   Fall and soft tissue injuries: Fairly stable.  Skeletal survey was negative.  Chronic medical issues COPD with asthma, currently not on any exacerbation.  Resumed Breo.  Resumed bronchodilators. Hypothyroidism: Resumed home dose of levothyroxine. Electrolytes: Replaced and adequate.  Will check potassium levels. Glaucoma: On timolol  drops.  Rheumatoid arthritis: Patient is on methotrexate.  She will skip this week of methotrexate for wound healing.    DVT prophylaxis: enoxaparin (LOVENOX) injection 40 mg Start: 04/13/23 1000 SCDs Start: 04/09/23 1450   Code Status: Full code Family Communication: None today. Disposition Plan: Status is: Inpatient Remains inpatient appropriate because: Immediate postop.     Consultants:  General surgery  Procedures:  None  Antimicrobials:  None     Objective: Vitals:   04/13/23 0128 04/13/23 0552 04/13/23 0907 04/13/23 0917  BP: (!) 128/59 (!) 122/53 124/63   Pulse: 71 66 76   Resp: 12  20   Temp: (!) 97.5 F (36.4 C) 97.8 F (36.6 C) (!) 97.5 F (36.4 C)   TempSrc: Oral Oral Oral   SpO2: 98% 100% 100% 97%  Weight:      Height:        Intake/Output Summary (Last 24 hours) at 04/13/2023 1354 Last data filed at 04/13/2023 0900 Gross per 24 hour  Intake 1220 ml  Output 280 ml  Net 940 ml   Filed Weights   04/09/23 0914 04/12/23 1055  Weight: 59 kg 59 kg    Examination:  General exam: Calm and comfortable.  Pleasant to conversation. Respiratory system: Clear to auscultation. Respiratory effort normal. Cardiovascular system: S1 & S2 heard, RRR. No pedal edema. Gastrointestinal system: Abdomen is nondistended, soft , surgical incision with dressing intact left inguinal region.  Abdomen is soft and nontender. Patient has ecchymosis on her anterior chest and also on the right side of her neck.  Data Reviewed: I have personally reviewed following labs and imaging studies  CBC: Recent Labs  Lab 04/09/23 1020 04/10/23 0443  WBC 11.4* 8.6  NEUTROABS 9.2*  --   HGB 13.6 13.2  HCT 39.3 39.4  MCV 93.6 96.8  PLT 127* 137*   Basic Metabolic Panel: Recent Labs  Lab 04/09/23 1020 04/09/23 1712 04/10/23 0443  NA 139  --  136  K 3.2*  --  3.6  CL 93*  --  97*  CO2 31  --  27  GLUCOSE 137*  --  84  BUN 22  --  18  CREATININE 0.59  --  0.69   CALCIUM 9.9  --  9.0  MG  --  2.1  --   PHOS  --  3.6  --    GFR: Estimated Creatinine Clearance: 41.7 mL/min (by C-G formula based on SCr of 0.69 mg/dL). Liver Function Tests: Recent Labs  Lab 04/09/23 1020 04/10/23 0443  AST 26 20  ALT 14 13  ALKPHOS 45 39  BILITOT 1.0 1.0  PROT 6.5 5.9*  ALBUMIN 3.4* 3.1*   Recent Labs  Lab 04/09/23 1020  LIPASE 47   No results for input(s): "AMMONIA" in the last 168 hours. Coagulation Profile: No results for input(s): "INR", "PROTIME" in the last 168 hours. Cardiac Enzymes: No results for input(s): "CKTOTAL", "CKMB", "CKMBINDEX", "TROPONINI" in the last 168 hours. BNP (last 3 results) No results for input(s): "PROBNP" in the last 8760 hours. HbA1C: No results for input(s): "HGBA1C" in the last 72 hours. CBG: No results for input(s): "GLUCAP" in the last 168 hours. Lipid Profile: No results for input(s): "CHOL", "HDL", "LDLCALC", "TRIG", "CHOLHDL", "LDLDIRECT" in the last 72 hours. Thyroid Function Tests: No results for input(s): "TSH", "T4TOTAL", "FREET4", "T3FREE", "THYROIDAB" in the last 72 hours.  Anemia Panel: No results for input(s): "VITAMINB12", "FOLATE", "FERRITIN", "TIBC", "IRON", "RETICCTPCT" in the last 72 hours. Sepsis Labs: Recent Labs  Lab 04/09/23 1020 04/09/23 1430  LATICACIDVEN 1.1 1.0    Recent Results (from the past 240 hour(s))  Resp panel by RT-PCR (RSV, Flu A&B, Covid) Anterior Nasal Swab     Status: None   Collection Time: 04/09/23 10:12 AM   Specimen: Anterior Nasal Swab  Result Value Ref Range Status   SARS Coronavirus 2 by RT PCR NEGATIVE NEGATIVE Final    Comment: (NOTE) SARS-CoV-2 target nucleic acids are NOT DETECTED.  The SARS-CoV-2 RNA is generally detectable in upper respiratory specimens during the acute phase of infection. The lowest concentration of SARS-CoV-2 viral copies this assay can detect is 138 copies/mL. A negative result does not preclude SARS-Cov-2 infection and should  not be used as the sole basis for treatment or other patient management decisions. A negative result may occur with  improper specimen collection/handling, submission of specimen other than nasopharyngeal swab, presence of viral mutation(s) within the areas targeted by this assay, and inadequate number of viral copies(<138 copies/mL). A negative result must be combined with clinical observations, patient history, and epidemiological information. The expected result is Negative.  Fact Sheet for Patients:  BloggerCourse.com  Fact Sheet for Healthcare Providers:  SeriousBroker.it  This test is no t yet approved or cleared by the [place] FDA and  has been authorized for detection and/or diagnosis of SARS-CoV-2 by FDA under an Emergency Use Authorization (EUA). This EUA will remain  in effect (meaning this test can be used) for the duration of the COVID-19 declaration under Section 564(b)(1) of the Act, 21 U.S.C.section 360bbb-3(b)(1),  unless the authorization is terminated  or revoked sooner.       Influenza A by PCR NEGATIVE NEGATIVE Final   Influenza B by PCR NEGATIVE NEGATIVE Final    Comment: (NOTE) The Xpert Xpress SARS-CoV-2/FLU/RSV plus assay is intended as an aid in the diagnosis of influenza from Nasopharyngeal swab specimens and should not be used as a sole basis for treatment. Nasal washings and aspirates are unacceptable for Xpert Xpress SARS-CoV-2/FLU/RSV testing.  Fact Sheet for Patients: BloggerCourse.com  Fact Sheet for Healthcare Providers: SeriousBroker.it  This test is not yet approved or cleared by the [place] FDA and has been authorized for detection and/or diagnosis of SARS-CoV-2 by FDA under an Emergency Use Authorization (EUA). This EUA will remain in effect (meaning this test can be used) for the duration of the COVID-19 declaration under  Section 564(b)(1) of the Act, 21 U.S.C. section 360bbb-3(b)(1), unless the authorization is terminated or revoked.     Resp Syncytial Virus by PCR NEGATIVE NEGATIVE Final    Comment: (NOTE) Fact Sheet for Patients: BloggerCourse.com  Fact Sheet for Healthcare Providers: SeriousBroker.it  This test is not yet approved or cleared by the [place] FDA and has been authorized for detection and/or diagnosis of SARS-CoV-2 by FDA under an Emergency Use Authorization (EUA). This EUA will remain in effect (meaning this test can be used) for the duration of the COVID-19 declaration under Section 564(b)(1) of the Act, 21 U.S.C. section 360bbb-3(b)(1), unless the authorization is terminated or revoked.  Performed at [facility], [address] [place], [place] [address]   Blood culture (routine x 2)     Status: None (Preliminary result)   Collection Time: 04/09/23 10:20 AM   Specimen: BLOOD  Result Value Ref Range Status   Specimen Description   Final    BLOOD SITE NOT SPECIFIED Performed at [facility], [address], [place], [place] [address]    Special Requests   Final    BOTTLES DRAWN AEROBIC AND ANAEROBIC Blood Culture adequate volume Performed at [facility], [address] [place], [place] [address]    Culture   Final    NO GROWTH 4 DAYS Performed at [facility], [address] [place], [place] [address]    Report Status PENDING  Incomplete  Blood culture (routine x 2)     Status: None (Preliminary result)   Collection Time: 04/09/23 10:30 AM   Specimen: BLOOD  Result Value Ref Range Status   Specimen Description   Final    BLOOD SITE NOT SPECIFIED Performed at [facility], [address] [place], [place] [address]    Special Requests   Final    BOTTLES DRAWN AEROBIC AND ANAEROBIC Blood Culture adequate volume Performed at  [facility], [address], [place], [place] [address]    Culture   Final    NO GROWTH 4 DAYS Performed at [facility], [address] [place], [place] [address]    Report Status PENDING  Incomplete  Surgical pcr screen     Status: None   Collection Time: 04/11/23  9:35 PM   Specimen: Nasal Mucosa; Nasal Swab  Result Value Ref Range Status   MRSA, PCR NEGATIVE NEGATIVE Final   Staphylococcus aureus NEGATIVE NEGATIVE Final    Comment: (NOTE) The Xpert SA Assay (FDA approved for NASAL specimens in patients 12 years of age and older), is one component of a comprehensive surveillance program.  It is not intended to diagnose infection nor to guide or monitor treatment. Performed at [facility], [address] [place], [place] [address]          Radiology Studies: No results found.      Scheduled Meds:  acetaminophen  1,000 mg Oral Q6H   cholecalciferol  5,000 Units Oral Daily   docusate sodium  100 mg Oral BID   enoxaparin (LOVENOX) injection  40 mg Subcutaneous Q24H   fluticasone furoate-vilanterol  1 puff Inhalation Daily   folic acid  2 mg Oral Daily   hydrochlorothiazide  25 mg Oral Daily   levothyroxine  50 mcg Oral QAC breakfast   pantoprazole  40 mg Oral Daily   potassium chloride  10 mEq Oral BID   primidone  25 mg Oral QHS   salsalate  1,500 mg Oral BID   timolol  1 drop Both Eyes BID   Continuous Infusions:     LOS: 4 days    Time spent: 40 minutes    Dorcas Carrow, MD [facility]

## 2023-04-13 NOTE — Evaluation (Signed)
Occupational Therapy Re-evaluation Patient Details Name: Kayla Hurley MRN: [id number] DOB: [birth year] Today's Date: 04/13/2023   History of Present Illness 88 yr old female with medical history significant of COPD, COVID-19, glaucoma, hypertensive retinopathy, macular degeneration, meningioma, rheumatoid arthritis, essential hypertension, hyperlipidemia, essential tremor, hypothyroidism, osteoarthritis, rheumatoid arthritis, thrombocytopenia who had 2 falls at home last night resulting in a contusion on her chest and multiple abrasions and ecchymosis on her arms. Found to have incarcerated left inguinal hernia with a small bowel loop.  Mild ascites.  Patient was given mild sedation and hernia was reduced at the bedside by surgery and patient admitted to the hospital. s/p hernia repair 04/12/23.   Clinical Impression   The pt is currently limited by the below listed deficits, which compromise her ADL performance and overall functional independence (see OT problem list). She is s/p hernia repair on 04-12-23. During the session today, she required CGA to stand using a RW, for toileting at bathroom level, and for grooming standing at the sink. She denied having pain, however reported soreness of her surgical site.  She also reported subjective feelings of generalized weakness. She will benefit from OT services in the acute care setting to maximize her independence with self-care tasks and to facilitate her safe return home. OT anticipates she will be able to return home with home therapy at discharge, though she stated she would like to see how she feels over the next day or so, as she does live alone and wants to feel confident in returning home at discharge.       If plan is discharge home, recommend the following: Assistance with cooking/housework;Assist for transportation;Help with stairs or ramp for entrance    Functional Status Assessment  Patient has had a recent decline in their functional  status and demonstrates the ability to make significant improvements in function in a reasonable and predictable amount of time.  Equipment Recommendations  Other (comment)    Recommendations for Other Services       Precautions / Restrictions Restrictions Weight Bearing Restrictions: No      Mobility Bed Mobility      General bed mobility comments: Pt up in recliner upon therapy arrival.    Transfers Overall transfer level: Needs assistance Equipment used: Rolling walker (2 wheels) Transfers: Sit to/from Stand Sit to Stand: Contact guard assist                  Balance     Sitting balance-Leahy Scale: Good         Standing balance comment: CGA with RW            ADL either performed or assessed with clinical judgement   ADL Overall ADL's : Needs assistance/impaired Eating/Feeding: Independent;Sitting Eating/Feeding Details (indicate cue type and reason): based on clinical judgement Grooming: Contact guard assist;Standing Grooming Details (indicate cue type and reason): She performed hand washing standing at the sink. She required 1 verbal cue to step closer to the sink when performing task.         Upper Body Dressing : Set up;Sitting Upper Body Dressing Details (indicate cue type and reason): simulated seated EOB Lower Body Dressing: Set up;Supervision/safety Lower Body Dressing Details (indicate cue type and reason): OT instructed on implementing the figure 4 technique for donning/doffing socks while seated in chair. She performed teach back with SBA. Toilet Transfer: Contact guard assist;Rolling walker (2 wheels);Grab bars;Ambulation Toilet Transfer Details (indicate cue type and reason): She ambulated to & from the bathroom  in her room using a RW. Toileting- Clothing Manipulation and Hygiene: Contact guard assist Toileting - Clothing Manipulation Details (indicate cue type and reason): She required steadying assist in standing. She performed seated  hygiene with SBA.             Vision   Additional Comments: She correctly read the time depicted on the wall clock.            Pertinent Vitals/Pain Pain Assessment Pain Assessment: No/denies pain Pain Location: reported soreness of her surgical site     Extremity/Trunk Assessment Upper Extremity Assessment Upper Extremity Assessment: Right hand dominant;Overall [facility] for tasks assessed   Lower Extremity Assessment Lower Extremity Assessment:  (slight generalized weakness)       Communication Communication Communication: No apparent difficulties   Cognition Arousal: Alert Behavior During Therapy: WFL for tasks assessed/performed Overall Cognitive Status: Within Functional Limits for tasks assessed        General Comments: Oriented x4, able to follow commands without difficulty                Home Living Family/patient expects to be discharged to:: Private residence Living Arrangements: Alone   Type of Home: House       Home Layout: One level       Prior Functioning/Environment Prior Level of Function : Independent/Modified Independent;Driving               ADLs Comments: She was independent with ADLs and driving.  She had hired assist for cleaning. She made simple meals/microwave meals.        OT Problem List: Decreased activity tolerance;Impaired balance (sitting and/or standing);Decreased strength;Decreased knowledge of precautions      OT Treatment/Interventions: Self-care/ADL training;Therapeutic exercise;Therapeutic activities;Energy conservation;DME and/or AE instruction;Balance training;Patient/family education    OT Goals(Current goals can be found in the care plan section) Acute Rehab OT Goals OT Goal Formulation: With patient Time For Goal Achievement: 04/25/23 Potential to Achieve Goals: Good  OT Frequency: Min 1X/week       AM-PAC OT "6 Clicks" Daily Activity     Outcome Measure Help from another person eating meals?:  None Help from another person taking care of personal grooming?: A Little Help from another person toileting, which includes using toliet, bedpan, or urinal?: A Little Help from another person bathing (including washing, rinsing, drying)?: A Little Help from another person to put on and taking off regular upper body clothing?: A Little Help from another person to put on and taking off regular lower body clothing?: A Little 6 Click Score: 19   End of Session Equipment Utilized During Treatment: Gait belt Nurse Communication: Mobility status  Activity Tolerance: Patient tolerated treatment well Patient left: in bed;with call bell/phone within reach;with bed alarm set  OT Visit Diagnosis: History of falling (Z91.81);Muscle weakness (generalized) (M62.81)                Time: 1000-1034 OT Time Calculation (min): 34 min Charges:  OT General Charges $OT Visit: 1 Visit OT Evaluation $OT Re-eval: 1 Re-eval OT Treatments $Self Care/Home Management : 8-22 mins    Reuben Likes, OTR/L 04/13/2023, 5:22 PM

## 2023-04-13 NOTE — Plan of Care (Signed)

## 2023-04-13 NOTE — Progress Notes (Signed)
1 Day Post-Op   Subjective/Chief Complaint: Sitting up in chair Has questions about her home meds, which I deferred to [facility] Tolerating clears - no nausea Flatus Using ice and tylenol for pain control  Objective: Vital signs in last 24 hours: Temp:  [97.5 F (36.4 C)-98.8 F (37.1 C)] 97.8 F (36.6 C) (11/07 0552) Pulse Rate:  [66-84] 66 (11/07 0552) Resp:  [12-16] 12 (11/07 0128) BP: (109-148)/(53-87) 122/53 (11/07 0552) SpO2:  [94 %-100 %] 100 % (11/07 0552) Weight:  [59 kg] 59 kg (11/06 1055) Last BM Date : 04/08/23  Intake/Output from previous day: 11/06 0701 - 11/07 0700 In: 1360 [P.O.:360; I.V.:900; IV Piggyback:100] Out: 280 [Urine:250; Blood:30] Intake/Output this shift: No intake/output data recorded.  WDWN in NAD Left groin - no hematoma; minimal swelling Dressing dry   Anti-infectives: Anti-infectives (From admission, onward)    Start     Dose/Rate Route Frequency Ordered Stop   04/12/23 1100  ceFAZolin (ANCEF) IVPB 2g/100 mL premix        2 g 200 mL/hr over 30 Minutes Intravenous  Once 04/11/23 1614 04/12/23 1311       Assessment/Plan: Incarcerated left femoral hernia s/p open repair of left femoral hernia and recurrent inguinal hernia with mesh 04/12/23 Kayla Hurley  Doing well on POD #1 Advance diet as tolerated Ice packs and Tylenol for pain Mobilize Home in next 24-48 hours from surgical standpoint.  LOS: 4 days    Kayla Hurley 04/13/2023

## 2023-04-13 NOTE — Plan of Care (Signed)
  Problem: Health Behavior/Discharge Planning: Goal: Ability to manage health-related needs will improve Outcome: Progressing   Problem: Clinical Measurements: Goal: Ability to maintain clinical measurements within normal limits will improve Outcome: Progressing   Problem: Activity: Goal: Risk for activity intolerance will decrease Outcome: Progressing   Problem: Nutrition: Goal: Adequate nutrition will be maintained Outcome: Progressing   Problem: Safety: Goal: Ability to remain free from injury will improve Outcome: Progressing   Problem: Skin Integrity: Goal: Risk for impaired skin integrity will decrease Outcome: Progressing   Problem: Skin Integrity: Goal: Demonstration of wound healing without infection will improve Outcome: Progressing   Problem: Education: Goal: Knowledge of General Education information will improve Description: Including pain rating scale, medication(s)/side effects and non-pharmacologic comfort measures Outcome: Adequate for Discharge   Problem: Clinical Measurements: Goal: Will remain free from infection Outcome: Adequate for Discharge Goal: Diagnostic test results will improve Outcome: Adequate for Discharge Goal: Respiratory complications will improve Outcome: Adequate for Discharge Goal: Cardiovascular complication will be avoided Outcome: Adequate for Discharge   Problem: Coping: Goal: Level of anxiety will decrease Outcome: Adequate for Discharge   Problem: Elimination: Goal: Will not experience complications related to urinary retention Outcome: Adequate for Discharge   Problem: Pain Management: Goal: General experience of comfort will improve Outcome: Adequate for Discharge

## 2023-04-13 NOTE — Anesthesia Postprocedure Evaluation (Signed)
Anesthesia Post Note  Patient: AMABEL STMARIE  Procedure(s) Performed: OPEN REPAIR RECURRENT LEFT INGUINAL HERNIA AND OPEN REPAIR LEFT FEMORAL HERNIA WITH MESH (Left)     Patient location during evaluation: PACU Anesthesia Type: General Level of consciousness: awake and alert Pain management: pain level controlled Vital Signs Assessment: post-procedure vital signs reviewed and stable Respiratory status: spontaneous breathing, nonlabored ventilation, respiratory function stable and patient connected to nasal cannula oxygen Cardiovascular status: blood pressure returned to baseline and stable Postop Assessment: no apparent nausea or vomiting Anesthetic complications: no   No notable events documented.  Last Vitals:  Vitals:   04/13/23 0907 04/13/23 0917  BP: 124/63   Pulse: 76   Resp: 20   Temp: (!) 36.4 C   SpO2: 100% 97%    Last Pain:  Vitals:   04/13/23 1020  TempSrc:   PainSc: 0-No pain                 Earl Lites P Joselyn Edling

## 2023-04-14 ENCOUNTER — Encounter (HOSPITAL_COMMUNITY): Payer: Self-pay | Admitting: Surgery

## 2023-04-14 DIAGNOSIS — K56609 Unspecified intestinal obstruction, unspecified as to partial versus complete obstruction: Secondary | ICD-10-CM | POA: Diagnosis not present

## 2023-04-14 LAB — COMPREHENSIVE METABOLIC PANEL
ALT: 9 U/L (ref 0–44)
AST: 15 U/L (ref 15–41)
Albumin: 2.7 g/dL — ABNORMAL LOW (ref 3.5–5.0)
Alkaline Phosphatase: 36 U/L — ABNORMAL LOW (ref 38–126)
Anion gap: 10 (ref 5–15)
BUN: 19 mg/dL (ref 8–23)
CO2: 28 mmol/L (ref 22–32)
Calcium: 8.5 mg/dL — ABNORMAL LOW (ref 8.9–10.3)
Chloride: 97 mmol/L — ABNORMAL LOW (ref 98–111)
Creatinine, Ser: 0.54 mg/dL (ref 0.44–1.00)
GFR, Estimated: >60 mL/min (ref >60)
Glucose, Bld: 127 mg/dL — ABNORMAL HIGH (ref 70–99)
Potassium: 3.6 mmol/L (ref 3.5–5.1)
Sodium: 135 mmol/L (ref 135–145)
Total Bilirubin: 0.4 mg/dL (ref <1.2)
Total Protein: 5.3 g/dL — ABNORMAL LOW (ref 6.5–8.1)

## 2023-04-14 LAB — CBC WITH DIFFERENTIAL/PLATELET
Abs Immature Granulocytes: 0.12 10*3/uL — ABNORMAL HIGH (ref 0.00–0.07)
Basophils Absolute: 0 10*3/uL (ref 0.0–0.1)
Basophils Relative: 0 %
Eosinophils Absolute: 0.2 10*3/uL (ref 0.0–0.5)
Eosinophils Relative: 2 %
HCT: 35.4 % — ABNORMAL LOW (ref 36.0–46.0)
Hemoglobin: 12 g/dL (ref 12.0–15.0)
Immature Granulocytes: 1 %
Lymphocytes Relative: 9 %
Lymphs Abs: 0.9 10*3/uL (ref 0.7–4.0)
MCH: 32.2 pg (ref 26.0–34.0)
MCHC: 33.9 g/dL (ref 30.0–36.0)
MCV: 94.9 fL (ref 80.0–100.0)
Monocytes Absolute: 2.2 10*3/uL — ABNORMAL HIGH (ref 0.1–1.0)
Monocytes Relative: 24 %
Neutro Abs: 5.8 10*3/uL (ref 1.7–7.7)
Neutrophils Relative %: 64 %
Platelets: 108 10*3/uL — ABNORMAL LOW (ref 150–400)
RBC: 3.73 MIL/uL — ABNORMAL LOW (ref 3.87–5.11)
RDW: 15.4 % (ref 11.5–15.5)
WBC: 9.2 10*3/uL (ref 4.0–10.5)
nRBC: 0 % (ref 0.0–0.2)

## 2023-04-14 LAB — CULTURE, BLOOD (ROUTINE X 2)
Culture: NO GROWTH
Culture: NO GROWTH
Special Requests: ADEQUATE
Special Requests: ADEQUATE

## 2023-04-14 LAB — MAGNESIUM: Magnesium: 1.9 mg/dL (ref 1.7–2.4)

## 2023-04-14 LAB — PHOSPHORUS: Phosphorus: 1.4 mg/dL — ABNORMAL LOW (ref 2.5–4.6)

## 2023-04-14 MED ORDER — POTASSIUM & SODIUM PHOSPHATES 280-160-250 MG PO PACK
1.0000 | PACK | Freq: Three times a day (TID) | ORAL | Status: DC
  Administered 2023-04-14 – 2023-04-19 (×19): 1 via ORAL
  Filled 2023-04-14 (×20): qty 1

## 2023-04-14 MED ORDER — LEUCOVORIN CALCIUM 5 MG PO TABS
10.0000 mg | ORAL_TABLET | Freq: Once | ORAL | Status: AC
  Administered 2023-04-15: 10 mg via ORAL
  Filled 2023-04-14: qty 2

## 2023-04-14 MED ORDER — METHOTREXATE SODIUM 2.5 MG PO TABS
10.0000 mg | ORAL_TABLET | Freq: Once | ORAL | Status: AC
  Administered 2023-04-14: 10 mg via ORAL
  Filled 2023-04-14: qty 4

## 2023-04-14 NOTE — Progress Notes (Signed)
PROGRESS NOTE    Kayla Hurley  UXL:[id number] DOB: June 08, 1934 DOA: 04/09/2023 PCP: Pcp, No    Brief Narrative:  88 year old with history of COPD and nocturnal hypoxemia that she uses 2 L oxygen at night.  macular degeneration, rheumatoid arthritis on methotrexate, hypertension, hyperlipidemia and essential tremors, hypothyroidism who had fall at home without prodromal symptoms.  She felt like she had flu.  She was also complaining of left lower quadrant pain due to chronic left inguinal hernia that she used to reduce herself however last night she could not do it.  She also had multiple episodes of vomiting.  Initially hemodynamically stable, on 2 L oxygen.  Found to have incarcerated left inguinal hernia with a small bowel loop.  Mild ascites.  Patient was given mild sedation and hernia was reduced at the bedside by surgery and patient admitted to the hospital.  Patient underwent left inguinal and femoral hernia repair.  Subjective:  Patient seen and examined.  Pain is controlled.  She is eating soft diet.  She had 2 bowel movements after restarting on stool softener.  She wants to go to a skilled nursing facility and already has some choices in her mind. She was asking about the rheumatoid arthritis medications.  Patient with a stable surgical condition, she can likely go back on methotrexate.  Will start.   Assessment & Plan:   Incarcerated left inguinal hernia with small bowel obstruction: Underwent exploration, inguinal hernia and femoral hernia repair on the left side.  Encourage mobility, good pain control.  Stool softener.  PT OT.  Advance diet.  Refer to SNF for short-term rehab.   Fall and soft tissue injuries: Fairly stable.  Skeletal survey was negative.  Chronic medical issues COPD with asthma, currently not on any exacerbation.  Resumed Breo.  Resumed bronchodilators.  Hypothyroidism: Resumed home dose of levothyroxine.  Electrolytes: Replaced and adequate.     Glaucoma: On timolol drops.  Rheumatoid arthritis: Patient is on methotrexate.  She is fairly stable.  Will start her weekly dose of methotrexate today.  Hypophosphatemia: Will keep on replacement.   DVT prophylaxis: enoxaparin (LOVENOX) injection 40 mg Start: 04/13/23 1000 SCDs Start: 04/09/23 1450   Code Status: Full code Family Communication: None Disposition Plan: Status is: Inpatient Remains inpatient appropriate because: Medically stable.  She will need a skilled nursing facility on discharge.     Consultants:  General surgery  Procedures:  Hernia repair  Antimicrobials:  None     Objective: Vitals:   04/13/23 0907 04/13/23 0917 04/13/23 1308 04/14/23 0603  BP: 124/63  (!) 112/52 (!) 109/46  Pulse: 76  73 76  Resp: 20  20 18   Temp: (!) 97.5 F (36.4 C)  98.3 F (36.8 C) 99.5 F (37.5 C)  TempSrc: Oral  Oral Oral  SpO2: 100% 97% 98% 93%  Weight:      Height:        Intake/Output Summary (Last 24 hours) at 04/14/2023 1154 Last data filed at 04/14/2023 0920 Gross per 24 hour  Intake 360 ml  Output 1400 ml  Net -1040 ml   Filed Weights   04/09/23 0914 04/12/23 1055  Weight: 59 kg 59 kg    Examination:  General: Fairly comfortable.  Sitting in chair.  On room air. Cardiovascular: S1-S2 normal.  Regular rate rhythm. Respiratory: Bilateral clear.  No added sounds. Gastrointestinal: Soft.  Nontender.  Left inguinal incision clean and dry.  Dressing intact. Ext: No swelling or edema. Neuro: Alert awake and oriented.  Skin: Ecchymosis anterior chest wall and right neck.     Data Reviewed: I have personally reviewed following labs and imaging studies  CBC: Recent Labs  Lab 04/09/23 1020 04/10/23 0443 04/14/23 0544  WBC 11.4* 8.6 9.2  NEUTROABS 9.2*  --  5.8  HGB 13.6 13.2 12.0  HCT 39.3 39.4 35.4*  MCV 93.6 96.8 94.9  PLT 127* 137* 108*   Basic Metabolic Panel: Recent Labs  Lab 04/09/23 1020 04/09/23 1712 04/10/23 0443  04/14/23 0544  NA 139  --  136 135  K 3.2*  --  3.6 3.6  CL 93*  --  97* 97*  CO2 31  --  27 28  GLUCOSE 137*  --  84 127*  BUN 22  --  18 19  CREATININE 0.59  --  0.69 0.54  CALCIUM 9.9  --  9.0 8.5*  MG  --  2.1  --  1.9  PHOS  --  3.6  --  1.4*   GFR: Estimated Creatinine Clearance: 41.7 mL/min (by C-G formula based on SCr of 0.54 mg/dL). Liver Function Tests: Recent Labs  Lab 04/09/23 1020 04/10/23 0443 04/14/23 0544  AST 26 20 15   ALT 14 13 9   ALKPHOS 45 39 36*  BILITOT 1.0 1.0 0.4  PROT 6.5 5.9* 5.3*  ALBUMIN 3.4* 3.1* 2.7*   Recent Labs  Lab 04/09/23 1020  LIPASE 47   No results for input(s): "AMMONIA" in the last 168 hours. Coagulation Profile: No results for input(s): "INR", "PROTIME" in the last 168 hours. Cardiac Enzymes: No results for input(s): "CKTOTAL", "CKMB", "CKMBINDEX", "TROPONINI" in the last 168 hours. BNP (last 3 results) No results for input(s): "PROBNP" in the last 8760 hours. HbA1C: No results for input(s): "HGBA1C" in the last 72 hours. CBG: No results for input(s): "GLUCAP" in the last 168 hours. Lipid Profile: No results for input(s): "CHOL", "HDL", "LDLCALC", "TRIG", "CHOLHDL", "LDLDIRECT" in the last 72 hours. Thyroid Function Tests: No results for input(s): "TSH", "T4TOTAL", "FREET4", "T3FREE", "THYROIDAB" in the last 72 hours.  Anemia Panel: No results for input(s): "VITAMINB12", "FOLATE", "FERRITIN", "TIBC", "IRON", "RETICCTPCT" in the last 72 hours. Sepsis Labs: Recent Labs  Lab 04/09/23 1020 04/09/23 1430  LATICACIDVEN 1.1 1.0    Recent Results (from the past 240 hour(s))  Resp panel by RT-PCR (RSV, Flu A&B, Covid) Anterior Nasal Swab     Status: None   Collection Time: 04/09/23 10:12 AM   Specimen: Anterior Nasal Swab  Result Value Ref Range Status   SARS Coronavirus 2 by RT PCR NEGATIVE NEGATIVE Final    Comment: (NOTE) SARS-CoV-2 target nucleic acids are NOT DETECTED.  The SARS-CoV-2 RNA is generally detectable  in upper respiratory specimens during the acute phase of infection. The lowest concentration of SARS-CoV-2 viral copies this assay can detect is 138 copies/mL. A negative result does not preclude SARS-Cov-2 infection and should not be used as the sole basis for treatment or other patient management decisions. A negative result may occur with  improper specimen collection/handling, submission of specimen other than nasopharyngeal swab, presence of viral mutation(s) within the areas targeted by this assay, and inadequate number of viral copies(<138 copies/mL). A negative result must be combined with clinical observations, patient history, and epidemiological information. The expected result is Negative.  Fact Sheet for Patients:  BloggerCourse.com  Fact Sheet for Healthcare Providers:  SeriousBroker.it  This test is no t yet approved or cleared by the [place] FDA and  has been authorized for detection and/or  diagnosis of SARS-CoV-2 by FDA under an Emergency Use Authorization (EUA). This EUA will remain  in effect (meaning this test can be used) for the duration of the COVID-19 declaration under Section 564(b)(1) of the Act, 21 U.S.C.section 360bbb-3(b)(1), unless the authorization is terminated  or revoked sooner.       Influenza A by PCR NEGATIVE NEGATIVE Final   Influenza B by PCR NEGATIVE NEGATIVE Final    Comment: (NOTE) The Xpert Xpress SARS-CoV-2/FLU/RSV plus assay is intended as an aid in the diagnosis of influenza from Nasopharyngeal swab specimens and should not be used as a sole basis for treatment. Nasal washings and aspirates are unacceptable for Xpert Xpress SARS-CoV-2/FLU/RSV testing.  Fact Sheet for Patients: BloggerCourse.com  Fact Sheet for Healthcare Providers: SeriousBroker.it  This test is not yet approved or cleared by the [place] FDA and has  been authorized for detection and/or diagnosis of SARS-CoV-2 by FDA under an Emergency Use Authorization (EUA). This EUA will remain in effect (meaning this test can be used) for the duration of the COVID-19 declaration under Section 564(b)(1) of the Act, 21 U.S.C. section 360bbb-3(b)(1), unless the authorization is terminated or revoked.     Resp Syncytial Virus by PCR NEGATIVE NEGATIVE Final    Comment: (NOTE) Fact Sheet for Patients: BloggerCourse.com  Fact Sheet for Healthcare Providers: SeriousBroker.it  This test is not yet approved or cleared by the [place] FDA and has been authorized for detection and/or diagnosis of SARS-CoV-2 by FDA under an Emergency Use Authorization (EUA). This EUA will remain in effect (meaning this test can be used) for the duration of the COVID-19 declaration under Section 564(b)(1) of the Act, 21 U.S.C. section 360bbb-3(b)(1), unless the authorization is terminated or revoked.  Performed at [facility], [address], [place], [place] [address]   Blood culture (routine x 2)     Status: None   Collection Time: 04/09/23 10:20 AM   Specimen: BLOOD  Result Value Ref Range Status   Specimen Description   Final    BLOOD SITE NOT SPECIFIED Performed at [facility], [address], [place], [place] [address]    Special Requests   Final    BOTTLES DRAWN AEROBIC AND ANAEROBIC Blood Culture adequate volume Performed at [facility], [address], [place], [place] [address]    Culture   Final    NO GROWTH 5 DAYS Performed at [facility], [address] [place], [place] [address]    Report Status 04/14/2023 FINAL  Final  Blood culture (routine x 2)     Status: None   Collection Time: 04/09/23 10:30 AM   Specimen: BLOOD  Result Value Ref Range Status   Specimen Description   Final    BLOOD SITE NOT SPECIFIED Performed at  [facility] [address], [place], [place] [address]    Special Requests   Final    BOTTLES DRAWN AEROBIC AND ANAEROBIC Blood Culture adequate volume Performed at [facility], [address] [place], [place] [address]    Culture   Final    NO GROWTH 5 DAYS Performed at [facility], [address], [place], [place] [address]    Report Status 04/14/2023 FINAL  Final  Surgical pcr screen     Status: None   Collection Time: 04/11/23  9:35 PM   Specimen: Nasal Mucosa; Nasal Swab  Result Value Ref Range Status   MRSA, PCR NEGATIVE NEGATIVE  Final   Staphylococcus aureus NEGATIVE NEGATIVE Final    Comment: (NOTE) The Xpert SA Assay (FDA approved for NASAL specimens in patients 43 years of age and older), is one component of a comprehensive surveillance program. It is not intended to diagnose infection nor to guide or monitor treatment. Performed at [facility], [address] [place], [place] [address]          Radiology Studies: No results found.      Scheduled Meds:  acetaminophen  1,000 mg Oral Q6H   cholecalciferol  5,000 Units Oral Daily   docusate sodium  100 mg Oral BID   enoxaparin (LOVENOX) injection  40 mg Subcutaneous Q24H   fluticasone furoate-vilanterol  1 puff Inhalation Daily   folic acid  2 mg Oral Daily   hydrochlorothiazide  25 mg Oral Daily   [START ON 04/15/2023] leucovorin  10 mg Oral Once   levothyroxine  50 mcg Oral QAC breakfast   methotrexate  10 mg Oral Once   pantoprazole  40 mg Oral Daily   potassium & sodium phosphates  1 packet Oral TID WC & HS   potassium chloride  10 mEq Oral BID   primidone  25 mg Oral QHS   salsalate  1,500 mg Oral BID   timolol  1 drop Both Eyes BID   Continuous Infusions:     LOS: 5 days    Time spent: 40 minutes    Dorcas Carrow, MD [facility]

## 2023-04-14 NOTE — TOC Progression Note (Signed)
Transition of Care [facility]) - Progression Note    Patient Details  Name: Kayla Hurley MRN: [id number] Date of Birth: 1934-07-26  Transition of [facility]) CM/SW Contact  Shariff Lasky, Olegario Messier, RN Phone Number: 04/14/2023, 1:53 PM  Clinical Narrative: Per permission son Thayer Ohm who lives in [place] defers to patient's sister [name] for ongoing communication tel#[phone]-agree to fax out-await bed offers, then choice.      Expected Discharge Plan: Skilled Nursing Facility Barriers to Discharge: Continued Medical Work up  Expected Discharge Plan and Services   Discharge Planning Services: CM Consult Post Acute Care Choice: Skilled Nursing Facility Living arrangements for the past 2 months: Single Family Home                                       Social Determinants of Health (SDOH) Interventions SDOH Screenings   Food Insecurity: No Food Insecurity (04/09/2023)  Housing: Patient Declined (04/09/2023)  Transportation Needs: No Transportation Needs (04/09/2023)  Utilities: Not At Risk (04/09/2023)  Tobacco Use: Medium Risk (04/12/2023)    Readmission Risk Interventions     No data to display

## 2023-04-14 NOTE — Progress Notes (Signed)
Report received from ongoing nurse. Agreed with nurse assessment of patient and will cont to monitor. 

## 2023-04-14 NOTE — Plan of Care (Signed)
  Problem: Pain Management: Goal: General experience of comfort will improve Outcome: Progressing   Problem: Skin Integrity: Goal: Risk for impaired skin integrity will decrease Outcome: Progressing   Problem: Education: Goal: Knowledge of General Education information will improve Description: Including pain rating scale, medication(s)/side effects and non-pharmacologic comfort measures Outcome: Adequate for Discharge   Problem: Health Behavior/Discharge Planning: Goal: Ability to manage health-related needs will improve Outcome: Adequate for Discharge   Problem: Clinical Measurements: Goal: Ability to maintain clinical measurements within normal limits will improve Outcome: Adequate for Discharge Goal: Will remain free from infection Outcome: Adequate for Discharge Goal: Diagnostic test results will improve Outcome: Adequate for Discharge Goal: Respiratory complications will improve Outcome: Adequate for Discharge Goal: Cardiovascular complication will be avoided Outcome: Adequate for Discharge   Problem: Activity: Goal: Risk for activity intolerance will decrease Outcome: Adequate for Discharge   Problem: Nutrition: Goal: Adequate nutrition will be maintained Outcome: Adequate for Discharge   Problem: Coping: Goal: Level of anxiety will decrease Outcome: Adequate for Discharge   Problem: Elimination: Goal: Will not experience complications related to bowel motility Outcome: Adequate for Discharge Goal: Will not experience complications related to urinary retention Outcome: Adequate for Discharge   Problem: Safety: Goal: Ability to remain free from injury will improve Outcome: Adequate for Discharge   Problem: Skin Integrity: Goal: Demonstration of wound healing without infection will improve Outcome: Adequate for Discharge

## 2023-04-14 NOTE — Evaluation (Signed)
Physical Therapy Evaluation Patient Details Name: Kayla Hurley MRN: [id number] DOB: 1934/12/17 Today's Date: 04/14/2023  History of Present Illness  88 y.o. female with medical history significant of COPD, COVID-19, glaucoma, hypertensive retinopathy, macular degeneration, meningioma, rheumatoid arthritis, essential hypertension, hyperlipidemia, essential tremor, hypothyroidism, osteoarthritis, rheumatoid arthritis, thrombocytopenia who had 2 falls at home last night resulting in a contusion on her chest and multiple abrasions and ecchymosis on her arms. Found to have incarcerated left inguinal hernia with a small bowel loop.  Mild ascites.  Patient was given mild sedation and hernia was reduced at the bedside by surgery and patient admitted to the hospital. s/p hernia repair 04/12/23.  Clinical Impression  Pt admitted with above diagnosis.  Pt currently with functional limitations due to the deficits listed below (see PT Problem List). Pt will benefit from acute skilled PT to increase their independence and safety with mobility to allow discharge.  Pt up in recliner visiting with family on arrival.  Pt agreeable to ambulate and utilized RW due to pain and for endurance however pt prefers her cane.  Pt anticipates d/c to SNF for rehab since she lives alone and family is not able to assist her.  Pt also had 2 falls just prior to this admission.         If plan is discharge home, recommend the following: A little help with walking and/or transfers;A little help with bathing/dressing/bathroom;Assistance with cooking/housework;Help with stairs or ramp for entrance   Can travel by private vehicle   Yes    Equipment Recommendations None recommended by PT  Recommendations for Other Services       Functional Status Assessment Patient has had a recent decline in their functional status and demonstrates the ability to make significant improvements in function in a reasonable and predictable amount of  time.     Precautions / Restrictions Precautions Precautions: Fall Restrictions Weight Bearing Restrictions: No      Mobility  Bed Mobility               General bed mobility comments: pt in recliner on arrival    Transfers Overall transfer level: Needs assistance Equipment used: Rolling walker (2 wheels) Transfers: Sit to/from Stand Sit to Stand: Contact guard assist           General transfer comment: verbal cues for hand placement    Ambulation/Gait Ambulation/Gait assistance: Contact guard assist Gait Distance (Feet): 200 Feet Assistive device: Rolling walker (2 wheels) Gait Pattern/deviations: Step-through pattern, Decreased stride length Gait velocity: decr     General Gait Details: verbal cues for RW positioning and posture  Stairs            Wheelchair Mobility     Tilt Bed    Modified Rankin (Stroke Patients Only)       Balance Overall balance assessment: History of Falls         Standing balance support: Single extremity supported, During functional activity Standing balance-Leahy Scale: Poor Standing balance comment: reaching for support to self steady with standing                             Pertinent Vitals/Pain Pain Assessment Pain Assessment: Faces Faces Pain Scale: Hurts little more Pain Location: surgical site with transitional movements Pain Descriptors / Indicators: Sore, Grimacing, Guarding Pain Intervention(s): Repositioned, Monitored during session    Home Living Family/patient expects to be discharged to:: Private residence Living Arrangements: Alone Available Help  at Discharge: Family;Friend(s);Available PRN/intermittently Type of Home: House Home Access: Stairs to enter   [employer] of Steps: 1 onto porch with grab bar   Home Layout: One level Home Equipment: Cane - single point;Grab bars - tub/shower;Grab bars - toilet      Prior Function Prior Level of Function :  Independent/Modified Independent;History of Falls (last six months)                     Extremity/Trunk Assessment        Lower Extremity Assessment Lower Extremity Assessment: Generalized weakness       Communication   Communication Communication: Hearing impairment  Cognition Arousal: Alert Behavior During Therapy: WFL for tasks assessed/performed Overall Cognitive Status: Within Functional Limits for tasks assessed                                          General Comments      Exercises     Assessment/Plan    PT Assessment Patient needs continued PT services  PT Problem List Decreased mobility;Decreased balance;Decreased activity tolerance;Decreased strength;Decreased knowledge of use of DME;Pain       PT Treatment Interventions DME instruction;Gait training;Balance training;Functional mobility training;Therapeutic activities;Therapeutic exercise;Patient/family education    PT Goals (Current goals can be found in the Care Plan section)  Acute Rehab PT Goals Patient Stated Goal: eventually return to OPPT PT Goal Formulation: With patient Time For Goal Achievement: 04/28/23 Potential to Achieve Goals: Good    Frequency Min 1X/week     Co-evaluation               AM-PAC PT "6 Clicks" Mobility  Outcome Measure Help needed turning from your back to your side while in a flat bed without using bedrails?: A Little Help needed moving from lying on your back to sitting on the side of a flat bed without using bedrails?: A Little Help needed moving to and from a bed to a chair (including a wheelchair)?: A Little Help needed standing up from a chair using your arms (e.g., wheelchair or bedside chair)?: A Little Help needed to walk in hospital room?: A Little Help needed climbing 3-5 steps with a railing? : A Lot 6 Click Score: 17    End of Session   Activity Tolerance: Patient limited by fatigue Patient left: in chair;with call  bell/phone within reach;with family/visitor present   PT Visit Diagnosis: Difficulty in walking, not elsewhere classified (R26.2);Muscle weakness (generalized) (M62.81)    Time: 2956-2130 PT Time Calculation (min) (ACUTE ONLY): 22 min   Charges:   PT Evaluation $PT Eval Low Complexity: 1 Low   PT General Charges $$ ACUTE PT VISIT: 1 Visit       Thomasene Mohair PT, DPT Physical Therapist Acute Rehabilitation Services Office: [phone]   Kati L Payson 04/14/2023, 1:01 PM

## 2023-04-14 NOTE — Progress Notes (Signed)
2 Days Post-Op   Subjective/Chief Complaint: Tolerating soft diet. Passing flatus and had bm. Pain well controlled  Objective: Vital signs in last 24 hours: Temp:  [98.3 F (36.8 C)-99.5 F (37.5 C)] 99.5 F (37.5 C) (11/08 0603) Pulse Rate:  [73-76] 76 (11/08 0603) Resp:  [18-20] 18 (11/08 0603) BP: (109-112)/(46-52) 109/46 (11/08 0603) SpO2:  [93 %-98 %] 93 % (11/08 0603) Last BM Date : 04/14/23  Intake/Output from previous day: 11/07 0701 - 11/08 0700 In: 600 [P.O.:600] Out: 1400 [Urine:1400] Intake/Output this shift: Total I/O In: 120 [P.O.:120] Out: -   WDWN in NAD Left groin - no hematoma; minimal swelling Dressing removed and incision with steri strips in place. No erythema or induration   Anti-infectives: Anti-infectives (From admission, onward)    Start     Dose/Rate Route Frequency Ordered Stop   04/12/23 1100  ceFAZolin (ANCEF) IVPB 2g/100 mL premix        2 g 200 mL/hr over 30 Minutes Intravenous  Once 04/11/23 1614 04/12/23 1311       Assessment/Plan: Incarcerated left femoral hernia s/p open repair of left femoral hernia and recurrent inguinal hernia with mesh 04/12/23 [place]  Tolerating diet. Pain controlled. Having bowel function. Incision looks good.  Surgically stable for discharge once medically cleared    LOS: 5 days    Eric Form, [facility] Surgery 04/14/2023, 12:37 PM Please see Amion for pager number during day hours 7:00am-4:30pm

## 2023-04-14 NOTE — TOC Initial Note (Signed)
Transition of Care [facility]) - Initial/Assessment Note    Patient Details  Name: Kayla Hurley MRN: [id number] Date of Birth: 10-22-1934  Transition [name]) CM/SW Contact:    Lanier Clam, RN Phone Number: 04/14/2023, 10:47 AM  Clinical Narrative:    awaiting PT eval prior faxing out for snf then offer choices.                Expected Discharge Plan: Skilled Nursing Facility Barriers to Discharge: Continued Medical Work up   Patient Goals and CMS Choice Patient states their goals for this hospitalization and ongoing recovery are:: Home CMS Medicare.gov Compare Post Acute Care list provided to:: Patient Represenative (must comment) (Niece Zella Ball) Choice offered to / list presented to : Adult Children (Niece Zella Ball) [place] ownership interest in [facility]provided to:: Adult Children    Expected Discharge Plan and Services   Discharge Planning Services: CM Consult   Living arrangements for the past 2 months: Single Family Home                                      Prior Living Arrangements/Services Living arrangements for the past 2 months: Single Family Home Lives with:: Self Patient language and need for interpreter reviewed:: Yes Do you feel safe going back to the place where you live?: Yes      Need for Family Participation in Patient Care: Yes (Comment) Care giver support system in place?: Yes (comment) Current home services: DME (cane) Criminal Activity/Legal Involvement Pertinent to Current Situation/Hospitalization: No - Comment as needed  Activities of Daily Living   ADL Screening (condition at time of admission) Independently performs ADLs?: No Does the patient have a NEW difficulty with bathing/dressing/toileting/self-feeding that is expected to last >3 days?: Yes (Initiates electronic notice to provider for possible OT consult) Does the patient have a NEW difficulty with getting in/out of bed, walking, or climbing stairs that is  expected to last >3 days?: Yes (Initiates electronic notice to provider for possible PT consult) Does the patient have a NEW difficulty with communication that is expected to last >3 days?: No Is the patient deaf or have difficulty hearing?: No Does the patient have difficulty seeing, even when wearing glasses/contacts?: No Does the patient have difficulty concentrating, remembering, or making decisions?: No  Permission Sought/Granted Permission sought to share information with : Case Manager Permission granted to share information with : Yes, Verbal Permission Granted  Share Information with NAME: Case manager           Emotional Assessment Appearance:: Appears stated age Attitude/Demeanor/Rapport: Gracious Affect (typically observed): Accepting Orientation: : Oriented to Self, Oriented to Place, Oriented to  Time, Oriented to Situation Alcohol / Substance Use: Not Applicable Psych Involvement: No (comment)  Admission diagnosis:  Small bowel obstruction (HCC) [K56.609] SBO (small bowel obstruction) (HCC) [K56.609] Hypoxia [R09.02] Hernia, inguinal, left [K40.90] Fall, initial encounter [W19.XXXA] Patient Active Problem List   Diagnosis Date Noted   Small bowel obstruction (HCC) 04/09/2023   Left inguinal hernia 04/09/2023   HCAP (healthcare-associated pneumonia) 09/08/2022   Hypokalemia 09/08/2022   Hyponatremia 09/08/2022   Severe sepsis (HCC) 09/07/2022   COVID 09/07/2022   COVID-19 virus infection 09/07/2022   OA (osteoarthritis) of hip 09/07/2022   Thrombocytopenia (HCC) 09/06/2022   Fall 09/06/2022   COVID-19 09/06/2022   Chronic rhinitis 06/02/2022   CAP (community acquired pneumonia) 04/19/2022   Viral upper respiratory  tract infection 03/31/2022   Impaired cognition 12/23/2021   Seborrheic dermatitis 12/23/2021   Urge incontinence of urine 12/23/2021   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   Chronic cough 08/26/2021   Psoriasis of scalp 05/14/2021   Essential  tremor 11/19/2020   Osteoporosis 07/14/2020   Anxiety disorder 02/18/2020   Hypertensive retinopathy 01/07/2020   Primary open angle glaucoma 01/02/2020   Urinary tract infectious disease 12/10/2019   Immunosuppression (HCC) 10/24/2019   Swelling of limb 10/24/2019   Panlobular emphysema (HCC) 04/11/2018   Rheumatoid arthritis (HCC) 04/10/2018   Hypothyroidism 04/10/2018   Dyslipidemia 04/10/2018   Essential hypertension 04/10/2018   PCP:  Pcp, No Pharmacy:   [facility] - [place], [place] - [address] [place] [address] Phone: [phone] Fax: [phone]     Social Determinants of Health (SDOH) Social History: SDOH Screenings   Food Insecurity: No Food Insecurity (04/09/2023)  Housing: Patient Declined (04/09/2023)  Transportation Needs: No Transportation Needs (04/09/2023)  Utilities: Not At Risk (04/09/2023)  Tobacco Use: Medium Risk (04/12/2023)   SDOH Interventions:     Readmission Risk Interventions     No data to display

## 2023-04-14 NOTE — NC FL2 (Signed)
[place] MEDICAID FL2 LEVEL OF CARE FORM     IDENTIFICATION  Patient Name: Kayla Hurley Birthdate: [birth year] Sex: female Admission Date (Current Location): 04/09/2023  [facility] [place] [facility] [place] Number:  Producer, television/film/video and Address:  [facility],  [address] [place], [place] [address]      Provider Number: [id number]  Attending Physician Name and Address:  Dorcas Carrow, MD  Relative Name and Phone Number:  chris Douglas(Son)[phone]    Current Level of Care: Hospital Recommended Level of Care: Skilled Nursing Facility Prior Approval Number:    Date Approved/Denied:   PASRR Number: [id number]  Discharge Plan: SNF    Current Diagnoses: Patient Active Problem List   Diagnosis Date Noted   Small bowel obstruction (HCC) 04/09/2023   Left inguinal hernia 04/09/2023   HCAP (healthcare-associated pneumonia) 09/08/2022   Hypokalemia 09/08/2022   Hyponatremia 09/08/2022   Severe sepsis (HCC) 09/07/2022   COVID 09/07/2022   COVID-19 virus infection 09/07/2022   OA (osteoarthritis) of hip 09/07/2022   Thrombocytopenia (HCC) 09/06/2022   Fall 09/06/2022   COVID-19 09/06/2022   Chronic rhinitis 06/02/2022   CAP (community acquired pneumonia) 04/19/2022   Viral upper respiratory tract infection 03/31/2022   Impaired cognition 12/23/2021   Seborrheic dermatitis 12/23/2021   Urge incontinence of urine 12/23/2021   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   Chronic cough 08/26/2021   Psoriasis of scalp 05/14/2021   Essential tremor 11/19/2020   Osteoporosis 07/14/2020   Anxiety disorder 02/18/2020   Hypertensive retinopathy 01/07/2020   Primary open angle glaucoma 01/02/2020   Urinary tract infectious disease 12/10/2019   Immunosuppression (HCC) 10/24/2019   Swelling of limb 10/24/2019   Panlobular emphysema (HCC) 04/11/2018   Rheumatoid arthritis (HCC) 04/10/2018   Hypothyroidism 04/10/2018   Dyslipidemia 04/10/2018   Essential hypertension 04/10/2018     Orientation RESPIRATION BLADDER Height & Weight     Self, Time, Situation, Place  Normal Continent Weight: 59 kg Height:  5\' 1"  (154.9 cm)  BEHAVIORAL SYMPTOMS/MOOD NEUROLOGICAL BOWEL NUTRITION STATUS      Continent Diet (Soft)  AMBULATORY STATUS COMMUNICATION OF NEEDS Skin   Limited Assist Verbally Skin abrasions, Bruising (genl abrasions arms;chest contusions from fall.)                       Personal Care Assistance Level of Assistance  Bathing, Feeding, Dressing Bathing Assistance: Limited assistance Feeding assistance: Limited assistance Dressing Assistance: Limited assistance     Functional Limitations Info  Sight, Hearing, Speech Sight Info: Impaired (eyeglasses) Hearing Info: Impaired (bilateral hearing aids) Speech Info: Impaired (Dentures-top)    SPECIAL CARE FACTORS FREQUENCY  PT (By licensed PT), OT (By licensed OT)     PT Frequency: 5x week OT Frequency: 5x week            Contractures Contractures Info: Not present    Additional Factors Info  Code Status, Allergies Code Status Info: Full Allergies Info: Adhesive (Tape), Cucumber Extract, Lactose Intolerance (Gi), Penicillins           Current Medications (04/14/2023):  This is the current hospital active medication list Current Facility-Administered Medications  Medication Dose Route Frequency Provider Last Rate Last Admin   acetaminophen (TYLENOL) tablet 1,000 mg  1,000 mg Oral Q6H Carl Best H, PA-C   1,000 mg at 04/14/23 1124   albuterol (PROVENTIL) (2.5 MG/3ML) 0.083% nebulizer solution 3 mL  3 mL Inhalation Q4H PRN Eric Form, PA-C  alum & mag hydroxide-simeth (MAALOX/MYLANTA) 200-200-20 MG/5ML suspension 30 mL  30 mL Oral Q4H PRN Eric Form, PA-C   30 mL at 04/14/23 1610   cholecalciferol (VITAMIN D3) tablet 5,000 Units  5,000 Units Oral Daily Eric Form, PA-C   5,000 Units at 04/14/23 1015   docusate sodium (COLACE) capsule 100 mg  100 mg Oral BID  Dorcas Carrow, MD   100 mg at 04/14/23 1015   enoxaparin (LOVENOX) injection 40 mg  40 mg Subcutaneous Q24H Eric Form, PA-C   40 mg at 04/14/23 1014   fentaNYL (SUBLIMAZE) injection 25 mcg  25 mcg Intravenous Q2H PRN Eric Form, PA-C   25 mcg at 04/12/23 0312   fluticasone furoate-vilanterol (BREO ELLIPTA) 200-25 MCG/ACT 1 puff  1 puff Inhalation Daily Eric Form, PA-C   1 puff at 04/14/23 1124   folic acid (FOLVITE) tablet 2 mg  2 mg Oral Daily Eric Form, PA-C   2 mg at 04/14/23 1015   hydrochlorothiazide (HYDRODIURIL) tablet 25 mg  25 mg Oral Daily Dorcas Carrow, MD   25 mg at 04/14/23 1015   [START ON 04/15/2023] leucovorin (WELLCOVORIN) tablet 10 mg  10 mg Oral Once Dorcas Carrow, MD       levothyroxine (SYNTHROID) tablet 50 mcg  50 mcg Oral QAC breakfast Eric Form, PA-C   50 mcg at 04/14/23 9604   methotrexate (RHEUMATREX) tablet 10 mg  10 mg Oral Once Dorcas Carrow, MD       ondansetron [facility]) tablet 4 mg  4 mg Oral Q6H PRN Eric Form, PA-C       Or   ondansetron [facility]) injection 4 mg  4 mg Intravenous Q6H PRN Eric Form, PA-C   4 mg at 04/10/23 1814   oxyCODONE (Oxy IR/ROXICODONE) immediate release tablet 2.5-5 mg  2.5-5 mg Oral Q4H PRN Eric Form, PA-C       pantoprazole (PROTONIX) EC tablet 40 mg  40 mg Oral Daily Earl Many M, RPH   40 mg at 04/14/23 1015   polyvinyl alcohol (LIQUIFILM TEARS) 1.4 % ophthalmic solution 1 drop  1 drop Both Eyes PRN Dorcas Carrow, MD       potassium & sodium phosphates (PHOS-NAK) 280-160-250 MG packet 1 packet  1 packet Oral TID WC & HS Ghimire, Kuber, MD       potassium chloride (KLOR-CON M) CR tablet 10 mEq  10 mEq Oral BID Dorcas Carrow, MD   10 mEq at 04/14/23 1015   primidone (MYSOLINE) tablet 25 mg  25 mg Oral QHS Eric Form, PA-C   25 mg at 04/13/23 2116   salsalate (DISALCID) tablet 1,500 mg  1,500 mg Oral BID Dorcas Carrow, MD   1,500 mg at 04/14/23 1034   timolol  (TIMOPTIC) 0.5 % ophthalmic solution 1 drop  1 drop Both Eyes BID Eric Form, PA-C   1 drop at 04/14/23 1015     Discharge Medications: Please see discharge summary for a list of discharge medications.  Relevant Imaging Results:  Relevant Lab Results:   Additional Information ss[id number], [place], [place]

## 2023-04-15 DIAGNOSIS — K56609 Unspecified intestinal obstruction, unspecified as to partial versus complete obstruction: Secondary | ICD-10-CM | POA: Diagnosis not present

## 2023-04-15 NOTE — Progress Notes (Signed)
Mobility Specialist - Progress Note  Pre-mobility: 73 bpm HR, 99/57 mmHg (68 MAP) BP   04/15/23 1158  Mobility  Activity Ambulated with assistance in room  Level of Assistance Standby assist, set-up cues, supervision of patient - no hands on  Assistive Device Front wheel walker  Distance Ambulated (ft) 40 ft  Range of Motion/Exercises Active  Activity Response Tolerated fair  Mobility Referral Yes  $Mobility charge 1 Mobility  Mobility Specialist Start Time (ACUTE ONLY) 1140  Mobility Specialist Stop Time (ACUTE ONLY) 1158  Mobility Specialist Time Calculation (min) (ACUTE ONLY) 18 min   Pt was found on recliner chair and agreeable to ambulate in room. Stated attempting to get up earlier and feeling dizzy, therefore BP checked and recorded above. Also c/o feeling nauseous. At EOS returned to recliner chair with all needs met. Call bell in reach.  Kayla Hurley Mobility Specialist

## 2023-04-15 NOTE — Progress Notes (Signed)
PROGRESS NOTE    Kayla Hurley  WJX:[id number] DOB: [birth year] DOA: 04/09/2023 PCP: Pcp, No    Brief Narrative:  88 year old with history of COPD and nocturnal hypoxemia that she uses 2 L oxygen at night.  macular degeneration, rheumatoid arthritis on methotrexate, hypertension, hyperlipidemia and essential tremors, hypothyroidism who had fall at home without prodromal symptoms.  She felt like she had flu.  She was also complaining of left lower quadrant pain due to chronic left inguinal hernia that she used to reduce herself however last night she could not do it.  She also had multiple episodes of vomiting.  Initially hemodynamically stable, on 2 L oxygen.  Found to have incarcerated left inguinal hernia with a small bowel loop.  Mild ascites.  Patient was given mild sedation and hernia was reduced at the bedside by surgery and patient admitted to the hospital.  Patient underwent left inguinal and femoral hernia repair.  Subjective:  Patient seen and examined.  She had some nausea after potassium tablets but denies any complaints.  Pain is controlled.  Brother and sister-in-law at the bedside. Tolerating soft diet.   Assessment & Plan:   Incarcerated left inguinal hernia with small bowel obstruction: Underwent exploration, inguinal hernia and femoral hernia repair on the left side.  Encourage mobility, good pain control.  Stool softener.  PT OT.  Tolerating diet. Refer to SNF for short-term rehab.   Fall and soft tissue injuries: Fairly stable.  Skeletal survey was negative.  Chronic medical issues COPD with asthma, currently not on any exacerbation.  Resumed Breo.  Resumed bronchodilators.  Hypothyroidism: Resumed home dose of levothyroxine.  Electrolytes: Replaced and adequate.    Glaucoma: On timolol drops.  Rheumatoid arthritis: Patient is on methotrexate.  She is fairly stable.  Will start her weekly dose of methotrexate today.  Hypophosphatemia: Will keep on  replacement.   DVT prophylaxis: enoxaparin (LOVENOX) injection 40 mg Start: 04/13/23 1000 SCDs Start: 04/09/23 1450   Code Status: Full code Family Communication: Brother at the bedside Disposition Plan: Status is: Inpatient Remains inpatient appropriate because: Medically stable.  She will need a skilled nursing facility on discharge.     Consultants:  General surgery  Procedures:  Hernia repair  Antimicrobials:  None     Objective: Vitals:   04/15/23 0451 04/15/23 0552 04/15/23 0914 04/15/23 0948  BP: (!) 104/46 (!) 109/52  125/61  Pulse: 69 73  96  Resp: 18     Temp: 97.8 F (36.6 C)     TempSrc: Oral     SpO2: 95%  97%   Weight:      Height:        Intake/Output Summary (Last 24 hours) at 04/15/2023 1309 Last data filed at 04/15/2023 0700 Gross per 24 hour  Intake 220 ml  Output 1700 ml  Net -1480 ml   Filed Weights   04/09/23 0914 04/12/23 1055  Weight: 59 kg 59 kg    Examination:  General: Comfortable sitting in chair. Cardiovascular: S1-S2 normal.  Regular rate rhythm. Respiratory: Bilateral clear.  No added sounds. Gastrointestinal: Soft.  Nontender.  Left inguinal incision clean and dry.  Dressing intact. Ext: No swelling or edema. Neuro: Alert awake and oriented. Skin: Ecchymosis anterior chest wall and right neck.     Data Reviewed: I have personally reviewed following labs and imaging studies  CBC: Recent Labs  Lab 04/09/23 1020 04/10/23 0443 04/14/23 0544  WBC 11.4* 8.6 9.2  NEUTROABS 9.2*  --  5.8  HGB  13.6 13.2 12.0  HCT 39.3 39.4 35.4*  MCV 93.6 96.8 94.9  PLT 127* 137* 108*   Basic Metabolic Panel: Recent Labs  Lab 04/09/23 1020 04/09/23 1712 04/10/23 0443 04/14/23 0544  NA 139  --  136 135  K 3.2*  --  3.6 3.6  CL 93*  --  97* 97*  CO2 31  --  27 28  GLUCOSE 137*  --  84 127*  BUN 22  --  18 19  CREATININE 0.59  --  0.69 0.54  CALCIUM 9.9  --  9.0 8.5*  MG  --  2.1  --  1.9  PHOS  --  3.6  --  1.4*    GFR: Estimated Creatinine Clearance: 41.7 mL/min (by C-G formula based on SCr of 0.54 mg/dL). Liver Function Tests: Recent Labs  Lab 04/09/23 1020 04/10/23 0443 04/14/23 0544  AST 26 20 15   ALT 14 13 9   ALKPHOS 45 39 36*  BILITOT 1.0 1.0 0.4  PROT 6.5 5.9* 5.3*  ALBUMIN 3.4* 3.1* 2.7*   Recent Labs  Lab 04/09/23 1020  LIPASE 47   No results for input(s): "AMMONIA" in the last 168 hours. Coagulation Profile: No results for input(s): "INR", "PROTIME" in the last 168 hours. Cardiac Enzymes: No results for input(s): "CKTOTAL", "CKMB", "CKMBINDEX", "TROPONINI" in the last 168 hours. BNP (last 3 results) No results for input(s): "PROBNP" in the last 8760 hours. HbA1C: No results for input(s): "HGBA1C" in the last 72 hours. CBG: No results for input(s): "GLUCAP" in the last 168 hours. Lipid Profile: No results for input(s): "CHOL", "HDL", "LDLCALC", "TRIG", "CHOLHDL", "LDLDIRECT" in the last 72 hours. Thyroid Function Tests: No results for input(s): "TSH", "T4TOTAL", "FREET4", "T3FREE", "THYROIDAB" in the last 72 hours.  Anemia Panel: No results for input(s): "VITAMINB12", "FOLATE", "FERRITIN", "TIBC", "IRON", "RETICCTPCT" in the last 72 hours. Sepsis Labs: Recent Labs  Lab 04/09/23 1020 04/09/23 1430  LATICACIDVEN 1.1 1.0    Recent Results (from the past 240 hour(s))  Resp panel by RT-PCR (RSV, Flu A&B, Covid) Anterior Nasal Swab     Status: None   Collection Time: 04/09/23 10:12 AM   Specimen: Anterior Nasal Swab  Result Value Ref Range Status   SARS Coronavirus 2 by RT PCR NEGATIVE NEGATIVE Final    Comment: (NOTE) SARS-CoV-2 target nucleic acids are NOT DETECTED.  The SARS-CoV-2 RNA is generally detectable in upper respiratory specimens during the acute phase of infection. The lowest concentration of SARS-CoV-2 viral copies this assay can detect is 138 copies/mL. A negative result does not preclude SARS-Cov-2 infection and should not be used as the sole  basis for treatment or other patient management decisions. A negative result may occur with  improper specimen collection/handling, submission of specimen other than nasopharyngeal swab, presence of viral mutation(s) within the areas targeted by this assay, and inadequate number of viral copies(<138 copies/mL). A negative result must be combined with clinical observations, patient history, and epidemiological information. The expected result is Negative.  Fact Sheet for Patients:  BloggerCourse.com  Fact Sheet for Healthcare Providers:  SeriousBroker.it  This test is no t yet approved or cleared by the [place] FDA and  has been authorized for detection and/or diagnosis of SARS-CoV-2 by FDA under an Emergency Use Authorization (EUA). This EUA will remain  in effect (meaning this test can be used) for the duration of the COVID-19 declaration under Section 564(b)(1) of the Act, 21 U.S.C.section 360bbb-3(b)(1), unless the authorization is terminated  or revoked sooner.  Influenza A by PCR NEGATIVE NEGATIVE Final   Influenza B by PCR NEGATIVE NEGATIVE Final    Comment: (NOTE) The Xpert Xpress SARS-CoV-2/FLU/RSV plus assay is intended as an aid in the diagnosis of influenza from Nasopharyngeal swab specimens and should not be used as a sole basis for treatment. Nasal washings and aspirates are unacceptable for Xpert Xpress SARS-CoV-2/FLU/RSV testing.  Fact Sheet for Patients: BloggerCourse.com  Fact Sheet for Healthcare Providers: SeriousBroker.it  This test is not yet approved or cleared by the [place] FDA and has been authorized for detection and/or diagnosis of SARS-CoV-2 by FDA under an Emergency Use Authorization (EUA). This EUA will remain in effect (meaning this test can be used) for the duration of the COVID-19 declaration under Section 564(b)(1) of the Act,  21 U.S.C. section 360bbb-3(b)(1), unless the authorization is terminated or revoked.     Resp Syncytial Virus by PCR NEGATIVE NEGATIVE Final    Comment: (NOTE) Fact Sheet for Patients: BloggerCourse.com  Fact Sheet for Healthcare Providers: SeriousBroker.it  This test is not yet approved or cleared by the [place] FDA and has been authorized for detection and/or diagnosis of SARS-CoV-2 by FDA under an Emergency Use Authorization (EUA). This EUA will remain in effect (meaning this test can be used) for the duration of the COVID-19 declaration under Section 564(b)(1) of the Act, 21 U.S.C. section 360bbb-3(b)(1), unless the authorization is terminated or revoked.  Performed at [facility], [address] [place], [place] [address]   Blood culture (routine x 2)     Status: None   Collection Time: 04/09/23 10:20 AM   Specimen: BLOOD  Result Value Ref Range Status   Specimen Description   Final    BLOOD SITE NOT SPECIFIED Performed at [facility], [address], [place], [place] [address]    Special Requests   Final    BOTTLES DRAWN AEROBIC AND ANAEROBIC Blood Culture adequate volume Performed at [facility], [address], [place], [place] [address]    Culture   Final    NO GROWTH 5 DAYS Performed at [facility], [address], [place], [place] [address]    Report Status 04/14/2023 FINAL  Final  Blood culture (routine x 2)     Status: None   Collection Time: 04/09/23 10:30 AM   Specimen: BLOOD  Result Value Ref Range Status   Specimen Description   Final    BLOOD SITE NOT SPECIFIED Performed at [facility], [address], [place], [place] [address]    Special Requests   Final    BOTTLES DRAWN AEROBIC AND ANAEROBIC Blood Culture adequate volume Performed at [facility], [address] [place], [place]  [address]    Culture   Final    NO GROWTH 5 DAYS Performed at [facility], [address], [place], [place] [address]    Report Status 04/14/2023 FINAL  Final  Surgical pcr screen     Status: None   Collection Time: 04/11/23  9:35 PM   Specimen: Nasal Mucosa; Nasal Swab  Result Value Ref Range Status   MRSA, PCR NEGATIVE NEGATIVE Final   Staphylococcus aureus NEGATIVE NEGATIVE Final    Comment: (NOTE) The Xpert SA Assay (FDA approved for NASAL specimens in patients 20 years of age and older), is one component of a comprehensive surveillance program. It is not intended to diagnose infection nor to guide or monitor treatment. Performed at [facility], [address], [place], [place] [address]          Radiology Studies: No results found.      Scheduled Meds:  acetaminophen  1,000 mg Oral Q6H   cholecalciferol  5,000 Units Oral Daily   docusate sodium  100 mg Oral BID   enoxaparin (LOVENOX) injection  40 mg Subcutaneous Q24H   fluticasone furoate-vilanterol  1 puff Inhalation Daily   folic acid  2 mg Oral Daily   hydrochlorothiazide  25 mg Oral Daily   levothyroxine  50 mcg Oral QAC breakfast   pantoprazole  40 mg Oral Daily   potassium & sodium phosphates  1 packet Oral TID WC & HS   potassium chloride  10 mEq Oral BID   primidone  25 mg Oral QHS   salsalate  1,500 mg Oral BID   timolol  1 drop Both Eyes BID   Continuous Infusions:     LOS: 6 days    Time spent: 25 minutes    Dorcas Carrow, MD [facility]

## 2023-04-15 NOTE — Plan of Care (Signed)
  Problem: Education: Goal: Knowledge of General Education information will improve Description: Including pain rating scale, medication(s)/side effects and non-pharmacologic comfort measures Outcome: Progressing   Problem: Health Behavior/Discharge Planning: Goal: Ability to manage health-related needs will improve Outcome: Progressing   Problem: Clinical Measurements: Goal: Ability to maintain clinical measurements within normal limits will improve Outcome: Progressing Goal: Will remain free from infection Outcome: Progressing Goal: Diagnostic test results will improve Outcome: Progressing Goal: Respiratory complications will improve Outcome: Progressing Goal: Cardiovascular complication will be avoided Outcome: Progressing   Problem: Activity: Goal: Risk for activity intolerance will decrease Outcome: Progressing   Problem: Nutrition: Goal: Adequate nutrition will be maintained Outcome: Progressing   Problem: Coping: Goal: Level of anxiety will decrease Outcome: Progressing   Problem: Elimination: Goal: Will not experience complications related to bowel motility Outcome: Progressing Goal: Will not experience complications related to urinary retention Outcome: Progressing   Problem: Pain Management: Goal: General experience of comfort will improve Outcome: Progressing   Problem: Safety: Goal: Ability to remain free from injury will improve Outcome: Progressing   Problem: Skin Integrity: Goal: Risk for impaired skin integrity will decrease Outcome: Progressing   Problem: Clinical Measurements: Goal: Ability to maintain clinical measurements within normal limits will improve Outcome: Progressing Goal: Postoperative complications will be avoided or minimized Outcome: Progressing   Problem: Skin Integrity: Goal: Demonstration of wound healing without infection will improve Outcome: Progressing

## 2023-04-16 DIAGNOSIS — K56609 Unspecified intestinal obstruction, unspecified as to partial versus complete obstruction: Secondary | ICD-10-CM | POA: Diagnosis not present

## 2023-04-16 LAB — URINALYSIS, ROUTINE W REFLEX MICROSCOPIC
Bilirubin Urine: NEGATIVE
Glucose, UA: NEGATIVE mg/dL
Hgb urine dipstick: NEGATIVE
Ketones, ur: NEGATIVE mg/dL
Leukocytes,Ua: NEGATIVE
Nitrite: NEGATIVE
Protein, ur: NEGATIVE mg/dL
Specific Gravity, Urine: 1.004 — ABNORMAL LOW (ref 1.005–1.030)
pH: 6 (ref 5.0–8.0)

## 2023-04-16 NOTE — Plan of Care (Signed)
  Problem: Education: Goal: Knowledge of General Education information will improve Description: Including pain rating scale, medication(s)/side effects and non-pharmacologic comfort measures Outcome: Adequate for Discharge   Problem: Health Behavior/Discharge Planning: Goal: Ability to manage health-related needs will improve Outcome: Progressing   Problem: Clinical Measurements: Goal: Ability to maintain clinical measurements within normal limits will improve Outcome: Progressing Goal: Will remain free from infection Outcome: Progressing Goal: Diagnostic test results will improve Outcome: Adequate for Discharge Goal: Respiratory complications will improve Outcome: Progressing Goal: Cardiovascular complication will be avoided Outcome: Adequate for Discharge   Problem: Activity: Goal: Risk for activity intolerance will decrease Outcome: Adequate for Discharge   Problem: Nutrition: Goal: Adequate nutrition will be maintained Outcome: Adequate for Discharge   Problem: Coping: Goal: Level of anxiety will decrease Outcome: Adequate for Discharge   Problem: Elimination: Goal: Will not experience complications related to bowel motility Outcome: Adequate for Discharge Goal: Will not experience complications related to urinary retention Outcome: Adequate for Discharge   Problem: Pain Management: Goal: General experience of comfort will improve Outcome: Adequate for Discharge   Problem: Safety: Goal: Ability to remain free from injury will improve Outcome: Adequate for Discharge   Problem: Skin Integrity: Goal: Risk for impaired skin integrity will decrease Outcome: Adequate for Discharge   Problem: Clinical Measurements: Goal: Ability to maintain clinical measurements within normal limits will improve Outcome: Adequate for Discharge Goal: Postoperative complications will be avoided or minimized Outcome: Adequate for Discharge   Problem: Skin Integrity: Goal:  Demonstration of wound healing without infection will improve Outcome: Adequate for Discharge

## 2023-04-16 NOTE — Progress Notes (Cosign Needed)
Pt refused Lovenox. Discussed risk and benefits. MD notified via secure chat. Pt seated on side of the bed, eating breakfast. Continue with plan of care.

## 2023-04-16 NOTE — Progress Notes (Signed)
Mobility Specialist - Progress Note   04/16/23 1500  Mobility  Activity Ambulated with assistance in hallway  Level of Assistance Contact guard assist, steadying assist  Assistive Device Other (Comment) (arm hold assist)  Distance Ambulated (ft) 500 ft  Range of Motion/Exercises Active  Activity Response Tolerated well  Mobility Referral Yes  $Mobility charge 1 Mobility  Mobility Specialist Start Time (ACUTE ONLY) 1509  Mobility Specialist Stop Time (ACUTE ONLY) 1524  Mobility Specialist Time Calculation (min) (ACUTE ONLY) 15 min   Received in chair and agreed to mobility. Had no issues throughout session. Returned to chair with all needs met.  Marilynne Halsted Mobility Specialist

## 2023-04-16 NOTE — Progress Notes (Signed)
Mobility Specialist - Progress Note   04/16/23 1100  Mobility  Activity Ambulated with assistance in hallway  Level of Assistance Contact guard assist, steadying assist  Assistive Device Front wheel walker;Other (Comment) (hand held assist)  Distance Ambulated (ft) 500 ft  Range of Motion/Exercises Active  Activity Response Tolerated well  Mobility Referral Yes  $Mobility charge 1 Mobility  Mobility Specialist Start Time (ACUTE ONLY) 1054  Mobility Specialist Stop Time (ACUTE ONLY) 1115  Mobility Specialist Time Calculation (min) (ACUTE ONLY) 21 min   Received in chair and agreed to mobility. Had no issues throughout session. Opted for hand held assist for the second half of the session.   Returned to chair with all needs met.  Marilynne Halsted Mobility Specialist

## 2023-04-16 NOTE — Progress Notes (Signed)
PROGRESS NOTE    Kayla Hurley  OVF:[id number] DOB: 08/07/1934 DOA: 04/09/2023 PCP: Pcp, No    Brief Narrative:  88 year old with history of COPD and nocturnal hypoxemia that she uses 2 L oxygen at night.  macular degeneration, rheumatoid arthritis on methotrexate, hypertension, hyperlipidemia and essential tremors, hypothyroidism who had fall at home without prodromal symptoms.  She felt like she had flu.  She was also complaining of left lower quadrant pain due to chronic left inguinal hernia that she used to reduce herself however last night she could not do it.  She also had multiple episodes of vomiting.  Initially hemodynamically stable, on 2 L oxygen.  Found to have incarcerated left inguinal hernia with a small bowel loop.  Mild ascites.  Patient was given mild sedation and hernia was reduced at the bedside by surgery and patient admitted to the hospital.  Patient underwent left inguinal and femoral hernia repair.  Subjective:  Patient seen and examined.  Denies any complaints.  Waiting to go to rehab.  She is also worried about able to go home and take care of few issues while at rehab.   Assessment & Plan:   Incarcerated left inguinal hernia with small bowel obstruction: Underwent exploration, inguinal hernia and femoral hernia repair on the left side.  Encourage mobility, good pain control.  Stool softener.  PT OT.  Tolerating diet. Refer to SNF for short-term rehab.   Fall and soft tissue injuries: Fairly stable.  Skeletal survey was negative.  Chronic medical issues COPD with asthma, currently not on any exacerbation.  Resumed Breo.  Resumed bronchodilators.  Hypothyroidism: Resumed home dose of levothyroxine.  Electrolytes: Replaced and adequate.    Glaucoma: On timolol drops.  Rheumatoid arthritis: Patient is on methotrexate.  She is fairly stable.  Will start her weekly dose of methotrexate today.  Hypophosphatemia: Will keep on replacement.   DVT prophylaxis:  enoxaparin (LOVENOX) injection 40 mg Start: 04/13/23 1000 SCDs Start: 04/09/23 1450   Code Status: Full code Family Communication: None today. Disposition Plan: Status is: Inpatient Remains inpatient appropriate because: Medically stable.  She will need a skilled nursing facility on discharge.     Consultants:  General surgery  Procedures:  Hernia repair  Antimicrobials:  None     Objective: Vitals:   04/16/23 0503 04/16/23 0621 04/16/23 0830 04/16/23 0917  BP: (!) 92/43 (!) 107/39  (!) 105/53  Pulse: 65 65  70  Resp: 18   20  Temp: 97.8 F (36.6 C)     TempSrc: Oral     SpO2: 97%  (S) 90% 100%  Weight:      Height:        Intake/Output Summary (Last 24 hours) at 04/16/2023 1111 Last data filed at 04/16/2023 0848 Gross per 24 hour  Intake 240 ml  Output 2200 ml  Net -1960 ml   Filed Weights   04/09/23 0914 04/12/23 1055  Weight: 59 kg 59 kg    Examination:  General: Comfortable laying in bed. Cardiovascular: S1-S2 normal.  Regular rate rhythm. Respiratory: Bilateral clear.  No added sounds. Gastrointestinal: Soft.  Nontender.  Left inguinal incision clean and dry.  Dressing intact. Ext: No swelling or edema. Neuro: Alert awake and oriented. Skin: Ecchymosis anterior chest wall and right neck.  Clearing up.     Data Reviewed: I have personally reviewed following labs and imaging studies  CBC: Recent Labs  Lab 04/10/23 0443 04/14/23 0544  WBC 8.6 9.2  NEUTROABS  --  5.8  HGB 13.2 12.0  HCT 39.4 35.4*  MCV 96.8 94.9  PLT 137* 108*   Basic Metabolic Panel: Recent Labs  Lab 04/09/23 1712 04/10/23 0443 04/14/23 0544  NA  --  136 135  K  --  3.6 3.6  CL  --  97* 97*  CO2  --  27 28  GLUCOSE  --  84 127*  BUN  --  18 19  CREATININE  --  0.69 0.54  CALCIUM  --  9.0 8.5*  MG 2.1  --  1.9  PHOS 3.6  --  1.4*   GFR: Estimated Creatinine Clearance: 41.7 mL/min (by C-G formula based on SCr of 0.54 mg/dL). Liver Function Tests: Recent  Labs  Lab 04/10/23 0443 04/14/23 0544  AST 20 15  ALT 13 9  ALKPHOS 39 36*  BILITOT 1.0 0.4  PROT 5.9* 5.3*  ALBUMIN 3.1* 2.7*   No results for input(s): "LIPASE", "AMYLASE" in the last 168 hours.  No results for input(s): "AMMONIA" in the last 168 hours. Coagulation Profile: No results for input(s): "INR", "PROTIME" in the last 168 hours. Cardiac Enzymes: No results for input(s): "CKTOTAL", "CKMB", "CKMBINDEX", "TROPONINI" in the last 168 hours. BNP (last 3 results) No results for input(s): "PROBNP" in the last 8760 hours. HbA1C: No results for input(s): "HGBA1C" in the last 72 hours. CBG: No results for input(s): "GLUCAP" in the last 168 hours. Lipid Profile: No results for input(s): "CHOL", "HDL", "LDLCALC", "TRIG", "CHOLHDL", "LDLDIRECT" in the last 72 hours. Thyroid Function Tests: No results for input(s): "TSH", "T4TOTAL", "FREET4", "T3FREE", "THYROIDAB" in the last 72 hours.  Anemia Panel: No results for input(s): "VITAMINB12", "FOLATE", "FERRITIN", "TIBC", "IRON", "RETICCTPCT" in the last 72 hours. Sepsis Labs: Recent Labs  Lab 04/09/23 1430  LATICACIDVEN 1.0    Recent Results (from the past 240 hour(s))  Resp panel by RT-PCR (RSV, Flu A&B, Covid) Anterior Nasal Swab     Status: None   Collection Time: 04/09/23 10:12 AM   Specimen: Anterior Nasal Swab  Result Value Ref Range Status   SARS Coronavirus 2 by RT PCR NEGATIVE NEGATIVE Final    Comment: (NOTE) SARS-CoV-2 target nucleic acids are NOT DETECTED.  The SARS-CoV-2 RNA is generally detectable in upper respiratory specimens during the acute phase of infection. The lowest concentration of SARS-CoV-2 viral copies this assay can detect is 138 copies/mL. A negative result does not preclude SARS-Cov-2 infection and should not be used as the sole basis for treatment or other patient management decisions. A negative result may occur with  improper specimen collection/handling, submission of specimen  other than nasopharyngeal swab, presence of viral mutation(s) within the areas targeted by this assay, and inadequate number of viral copies(<138 copies/mL). A negative result must be combined with clinical observations, patient history, and epidemiological information. The expected result is Negative.  Fact Sheet for Patients:  BloggerCourse.com  Fact Sheet for Healthcare Providers:  SeriousBroker.it  This test is no t yet approved or cleared by the [place] FDA and  has been authorized for detection and/or diagnosis of SARS-CoV-2 by FDA under an Emergency Use Authorization (EUA). This EUA will remain  in effect (meaning this test can be used) for the duration of the COVID-19 declaration under Section 564(b)(1) of the Act, 21 U.S.C.section 360bbb-3(b)(1), unless the authorization is terminated  or revoked sooner.       Influenza A by PCR NEGATIVE NEGATIVE Final   Influenza B by PCR NEGATIVE NEGATIVE Final    Comment: (NOTE) The Xpert  Xpress SARS-CoV-2/FLU/RSV plus assay is intended as an aid in the diagnosis of influenza from Nasopharyngeal swab specimens and should not be used as a sole basis for treatment. Nasal washings and aspirates are unacceptable for Xpert Xpress SARS-CoV-2/FLU/RSV testing.  Fact Sheet for Patients: BloggerCourse.com  Fact Sheet for Healthcare Providers: SeriousBroker.it  This test is not yet approved or cleared by the [place] FDA and has been authorized for detection and/or diagnosis of SARS-CoV-2 by FDA under an Emergency Use Authorization (EUA). This EUA will remain in effect (meaning this test can be used) for the duration of the COVID-19 declaration under Section 564(b)(1) of the Act, 21 U.S.C. section 360bbb-3(b)(1), unless the authorization is terminated or revoked.     Resp Syncytial Virus by PCR NEGATIVE NEGATIVE Final     Comment: (NOTE) Fact Sheet for Patients: BloggerCourse.com  Fact Sheet for Healthcare Providers: SeriousBroker.it  This test is not yet approved or cleared by the [place] FDA and has been authorized for detection and/or diagnosis of SARS-CoV-2 by FDA under an Emergency Use Authorization (EUA). This EUA will remain in effect (meaning this test can be used) for the duration of the COVID-19 declaration under Section 564(b)(1) of the Act, 21 U.S.C. section 360bbb-3(b)(1), unless the authorization is terminated or revoked.  Performed at [facility], [address], [place], [place] [address]   Blood culture (routine x 2)     Status: None   Collection Time: 04/09/23 10:20 AM   Specimen: BLOOD  Result Value Ref Range Status   Specimen Description   Final    BLOOD SITE NOT SPECIFIED Performed at [facility], [address], [place], [place] [address]    Special Requests   Final    BOTTLES DRAWN AEROBIC AND ANAEROBIC Blood Culture adequate volume Performed at [facility], [address] [place], [place] [address]    Culture   Final    NO GROWTH 5 DAYS Performed at [facility], [address], [place], [place] [address]    Report Status 04/14/2023 FINAL  Final  Blood culture (routine x 2)     Status: None   Collection Time: 04/09/23 10:30 AM   Specimen: BLOOD  Result Value Ref Range Status   Specimen Description   Final    BLOOD SITE NOT SPECIFIED Performed at [facility], [address], [place], [place] [address]    Special Requests   Final    BOTTLES DRAWN AEROBIC AND ANAEROBIC Blood Culture adequate volume Performed at [facility], [address] [place], [place] [address]    Culture   Final    NO GROWTH 5 DAYS Performed at [facility], [address], [place], [place] [address]    Report Status  04/14/2023 FINAL  Final  Surgical pcr screen     Status: None   Collection Time: 04/11/23  9:35 PM   Specimen: Nasal Mucosa; Nasal Swab  Result Value Ref Range Status   MRSA, PCR NEGATIVE NEGATIVE Final   Staphylococcus aureus NEGATIVE NEGATIVE Final    Comment: (NOTE) The Xpert SA Assay (FDA approved for NASAL specimens in patients 22 years of age and older), is one component of a comprehensive surveillance program. It is not intended to diagnose infection nor to guide or monitor treatment. Performed at [facility], [address], [place], [place] [address]          Radiology Studies: No results found.  Scheduled Meds:  acetaminophen  1,000 mg Oral Q6H   cholecalciferol  5,000 Units Oral Daily   docusate sodium  100 mg Oral BID   enoxaparin (LOVENOX) injection  40 mg Subcutaneous Q24H   fluticasone furoate-vilanterol  1 puff Inhalation Daily   folic acid  2 mg Oral Daily   hydrochlorothiazide  25 mg Oral Daily   levothyroxine  50 mcg Oral QAC breakfast   pantoprazole  40 mg Oral Daily   potassium & sodium phosphates  1 packet Oral TID WC & HS   potassium chloride  10 mEq Oral BID   primidone  25 mg Oral QHS   salsalate  1,500 mg Oral BID   timolol  1 drop Both Eyes BID   Continuous Infusions:     LOS: 7 days    Time spent: 25 minutes    Dorcas Carrow, MD [facility]

## 2023-04-17 DIAGNOSIS — K56609 Unspecified intestinal obstruction, unspecified as to partial versus complete obstruction: Secondary | ICD-10-CM | POA: Diagnosis not present

## 2023-04-17 MED ORDER — POLYETHYLENE GLYCOL 3350 17 G PO PACK
17.0000 g | PACK | Freq: Every day | ORAL | Status: DC
  Administered 2023-04-17 – 2023-04-19 (×3): 17 g via ORAL
  Filled 2023-04-17 (×3): qty 1

## 2023-04-17 NOTE — Evaluation (Signed)
Occupational Therapy Treatment Patient Details Name: Kayla Hurley MRN: [id number] DOB: Dec 09, 1934 Today's Date: 04/17/2023   History of present illness 88 y.o. female with medical history significant of COPD, COVID-19, glaucoma, hypertensive retinopathy, macular degeneration, meningioma, rheumatoid arthritis, essential hypertension, hyperlipidemia, essential tremor, hypothyroidism, osteoarthritis, rheumatoid arthritis, thrombocytopenia who had 2 falls at home last night resulting in a contusion on her chest and multiple abrasions and ecchymosis on her arms. Found to have incarcerated left inguinal hernia with a small bowel loop.  Mild ascites.  Patient was given mild sedation and hernia was reduced at the bedside by surgery and patient admitted to the hospital. s/p hernia repair 04/12/23.   OT comments  Pt completes functional mobility and toileting task including hand hygiene standing and pericare using lateral leans with supervision/CGA and cues for safety. Educated provided to pt on importance of fall prevention to reduce hospital readmission and injuries. Pt continues to prefer to reach for furniture, countertops and hand held assist vs AD to support balance. Pt denies pain or dizziness, despite BP in standing 90/74 (80). OT will continue to follow POC to promote functional gains. Discharge recommendation updated. Patient will benefit from continued inpatient follow up therapy, <3 hours/day.       If plan is discharge home, recommend the following:  Assistance with cooking/housework;Assist for transportation;Help with stairs or ramp for entrance;A little help with walking and/or transfers;A little help with bathing/dressing/bathroom   Equipment Recommendations  BSC/3in1    Recommendations for Other Services      Precautions / Restrictions Precautions Precautions: Fall Precaution Comments: orthostatic hypotension Restrictions Weight Bearing Restrictions: No       Mobility Bed  Mobility               General bed mobility comments: NT    Transfers Overall transfer level: Needs assistance Equipment used: None Transfers: Sit to/from Stand Sit to Stand: Supervision                 Balance Overall balance assessment: History of Falls   Sitting balance-Leahy Scale: Good     Standing balance support: Single extremity supported, During functional activity Standing balance-Leahy Scale: Poor Standing balance comment: uses counter and furniture throughout to steady self                           ADL either performed or assessed with clinical judgement   ADL Overall ADL's : Needs assistance/impaired     Grooming: Supervision/safety                   Toilet Transfer: Radiographer, therapeutic Details (indicate cue type and reason): pt declines use of RW, prefers to ambulate with either HHA or light assist from furniture Toileting- Clothing Manipulation and Hygiene: Supervision/safety         General ADL Comments: Supervision assist for ADLs and mobility this session      Cognition Arousal: Alert Behavior During Therapy: WFL for tasks assessed/performed Overall Cognitive Status: Within Functional Limits for tasks assessed                                          Exercises Exercises: Other exercises Other Exercises Other Exercises: Education of fall prevention strategies throughout       General Comments Standing BP 90/74 (80), sitting 112/62 (78), SpO2 98% on RA. Pt  denies dizziness    Pertinent Vitals/ Pain       Pain Assessment Pain Assessment: No/denies pain   Frequency  Min 1X/week        Progress Toward Goals  OT Goals(current goals can now be found in the care plan section)  Progress towards OT goals: Progressing toward goals  Acute Rehab OT Goals Patient Stated Goal: to go home to my dog OT Goal Formulation: With patient Time For Goal Achievement: 04/25/23 Potential  to Achieve Goals: Good ADL Goals Pt Will Perform Lower Body Dressing: with modified independence;with adaptive equipment;sit to/from stand;sitting/lateral leans Pt Will Transfer to Toilet: with modified independence;ambulating;regular height toilet;grab bars Pt Will Perform Toileting - Clothing Manipulation and hygiene: with modified independence;sitting/lateral leans;sit to/from stand Pt/caregiver will Perform Home Exercise Program: Increased strength;Both right and left upper extremity;With theraband;Independently;With written HEP provided Additional ADL Goal #1: Pt will be educated and verbalize understanding of energy conservation techniques that may help increased activity tolerance and ability to complete BADL tasks with less fatigue.  Plan         AM-PAC OT "6 Clicks" Daily Activity     Outcome Measure   Help from another person eating meals?: None Help from another person taking care of personal grooming?: A Little Help from another person toileting, which includes using toliet, bedpan, or urinal?: A Little Help from another person bathing (including washing, rinsing, drying)?: A Little Help from another person to put on and taking off regular upper body clothing?: A Little Help from another person to put on and taking off regular lower body clothing?: A Little 6 Click Score: 19    End of Session Equipment Utilized During Treatment: Gait belt  OT Visit Diagnosis: History of falling (Z91.81);Muscle weakness (generalized) (M62.81)   Activity Tolerance Patient tolerated treatment well   Patient Left in chair;with call bell/phone within reach (alarm not on upon arrival, RN cleared for no chair alarm)   Nurse Communication Mobility status        Time: 1884-1660 OT Time Calculation (min): 30 min  Charges: OT General Charges $OT Visit: 1 Visit OT Treatments $Self Care/Home Management : 23-37 mins  Kayla Hurley, OTR/L  04/17/23, 4:45 PM

## 2023-04-17 NOTE — Progress Notes (Signed)
PROGRESS NOTE    Kayla Hurley  ACZ:[id number] DOB: 1934/08/15 DOA: 04/09/2023 PCP: Pcp, No    Brief Narrative:  88 year old with history of COPD and nocturnal hypoxemia that she uses 2 L oxygen at night.  macular degeneration, rheumatoid arthritis on methotrexate, hypertension, hyperlipidemia and essential tremors, hypothyroidism who had fall at home without prodromal symptoms.  She felt like she had flu.  She was also complaining of left lower quadrant pain due to chronic left inguinal hernia that she used to reduce herself however last night she could not do it.  She also had multiple episodes of vomiting.  Initially hemodynamically stable, on 2 L oxygen.  Found to have incarcerated left inguinal hernia with a small bowel loop.  Mild ascites.  Patient was given mild sedation and hernia was reduced at the bedside by surgery and patient admitted to the hospital.  Patient underwent left inguinal and femoral hernia repair.  Subjective:  Seen and examined.  Waiting to go to rehab.  No bowel movement for last 2 days.  Denies any nausea vomiting or abdominal pain.  Wants to avoid straining and wonders if she can have regular diet. She misses her dog.   Assessment & Plan:   Incarcerated left inguinal hernia with small bowel obstruction: Underwent exploration, inguinal hernia and femoral hernia repair on the left side.  Encourage mobility, good pain control.  Stool softener.  Add MiraLAX today.   Advance to regular diet.    Fall and soft tissue injuries: Fairly stable.  Skeletal survey was negative.  Chronic medical issues COPD with asthma, currently not on any exacerbation.  Resumed Breo.  Resumed bronchodilators.  Hypothyroidism: Resumed home dose of levothyroxine.  Electrolytes: Replaced and adequate.    Glaucoma: On timolol drops.  Rheumatoid arthritis: Patient is on methotrexate.  As she had small incision and relatively healing well, she was given her dose of methotrexate.     Hypophosphatemia: Will keep on replacement.   DVT prophylaxis: enoxaparin (LOVENOX) injection 40 mg Start: 04/13/23 1000 SCDs Start: 04/09/23 1450   Code Status: Full code Family Communication: None . Disposition Plan: Status is: Inpatient Remains inpatient appropriate because: Medically stable.  She will need a skilled nursing facility on discharge.     Consultants:  General surgery  Procedures:  Hernia repair  Antimicrobials:  None     Objective: Vitals:   04/16/23 1300 04/16/23 2039 04/17/23 0504 04/17/23 0735  BP: (!) 105/43 (!) 103/49 (!) 114/55   Pulse: 63 80 72   Resp: 20 18 12    Temp: 97.9 F (36.6 C) 98.8 F (37.1 C) 98 F (36.7 C)   TempSrc: Oral Oral Oral   SpO2: 100% 99% 95% 97%  Weight:      Height:        Intake/Output Summary (Last 24 hours) at 04/17/2023 1143 Last data filed at 04/17/2023 1001 Gross per 24 hour  Intake 1080 ml  Output 1600 ml  Net -520 ml   Filed Weights   04/09/23 0914 04/12/23 1055  Weight: 59 kg 59 kg    Examination:  General: Comfortable sitting in chair.  Cardiovascular: S1-S2 normal.  Regular rate rhythm. Respiratory: Bilateral clear.  No added sounds. Gastrointestinal: Soft.  Nontender.  Left inguinal incision with slight redness but clean and dry.   Ext: No swelling or edema. Neuro: Alert awake and oriented. Skin: fading Ecchymosis anterior chest wall and right neck.       Data Reviewed: I have personally reviewed following labs and  imaging studies  CBC: Recent Labs  Lab 04/14/23 0544  WBC 9.2  NEUTROABS 5.8  HGB 12.0  HCT 35.4*  MCV 94.9  PLT 108*   Basic Metabolic Panel: Recent Labs  Lab 04/14/23 0544  NA 135  K 3.6  CL 97*  CO2 28  GLUCOSE 127*  BUN 19  CREATININE 0.54  CALCIUM 8.5*  MG 1.9  PHOS 1.4*   GFR: Estimated Creatinine Clearance: 41.7 mL/min (by C-G formula based on SCr of 0.54 mg/dL). Liver Function Tests: Recent Labs  Lab 04/14/23 0544  AST 15  ALT 9   ALKPHOS 36*  BILITOT 0.4  PROT 5.3*  ALBUMIN 2.7*   No results for input(s): "LIPASE", "AMYLASE" in the last 168 hours.  No results for input(s): "AMMONIA" in the last 168 hours. Coagulation Profile: No results for input(s): "INR", "PROTIME" in the last 168 hours. Cardiac Enzymes: No results for input(s): "CKTOTAL", "CKMB", "CKMBINDEX", "TROPONINI" in the last 168 hours. BNP (last 3 results) No results for input(s): "PROBNP" in the last 8760 hours. HbA1C: No results for input(s): "HGBA1C" in the last 72 hours. CBG: No results for input(s): "GLUCAP" in the last 168 hours. Lipid Profile: No results for input(s): "CHOL", "HDL", "LDLCALC", "TRIG", "CHOLHDL", "LDLDIRECT" in the last 72 hours. Thyroid Function Tests: No results for input(s): "TSH", "T4TOTAL", "FREET4", "T3FREE", "THYROIDAB" in the last 72 hours.  Anemia Panel: No results for input(s): "VITAMINB12", "FOLATE", "FERRITIN", "TIBC", "IRON", "RETICCTPCT" in the last 72 hours. Sepsis Labs: No results for input(s): "PROCALCITON", "LATICACIDVEN" in the last 168 hours.   Recent Results (from the past 240 hour(s))  Resp panel by RT-PCR (RSV, Flu A&B, Covid) Anterior Nasal Swab     Status: None   Collection Time: 04/09/23 10:12 AM   Specimen: Anterior Nasal Swab  Result Value Ref Range Status   SARS Coronavirus 2 by RT PCR NEGATIVE NEGATIVE Final    Comment: (NOTE) SARS-CoV-2 target nucleic acids are NOT DETECTED.  The SARS-CoV-2 RNA is generally detectable in upper respiratory specimens during the acute phase of infection. The lowest concentration of SARS-CoV-2 viral copies this assay can detect is 138 copies/mL. A negative result does not preclude SARS-Cov-2 infection and should not be used as the sole basis for treatment or other patient management decisions. A negative result may occur with  improper specimen collection/handling, submission of specimen other than nasopharyngeal swab, presence of viral mutation(s)  within the areas targeted by this assay, and inadequate number of viral copies(<138 copies/mL). A negative result must be combined with clinical observations, patient history, and epidemiological information. The expected result is Negative.  Fact Sheet for Patients:  BloggerCourse.com  Fact Sheet for Healthcare Providers:  SeriousBroker.it  This test is no t yet approved or cleared by the [place] FDA and  has been authorized for detection and/or diagnosis of SARS-CoV-2 by FDA under an Emergency Use Authorization (EUA). This EUA will remain  in effect (meaning this test can be used) for the duration of the COVID-19 declaration under Section 564(b)(1) of the Act, 21 U.S.C.section 360bbb-3(b)(1), unless the authorization is terminated  or revoked sooner.       Influenza A by PCR NEGATIVE NEGATIVE Final   Influenza B by PCR NEGATIVE NEGATIVE Final    Comment: (NOTE) The Xpert Xpress SARS-CoV-2/FLU/RSV plus assay is intended as an aid in the diagnosis of influenza from Nasopharyngeal swab specimens and should not be used as a sole basis for treatment. Nasal washings and aspirates are unacceptable for Xpert Xpress  SARS-CoV-2/FLU/RSV testing.  Fact Sheet for Patients: BloggerCourse.com  Fact Sheet for Healthcare Providers: SeriousBroker.it  This test is not yet approved or cleared by the [place] FDA and has been authorized for detection and/or diagnosis of SARS-CoV-2 by FDA under an Emergency Use Authorization (EUA). This EUA will remain in effect (meaning this test can be used) for the duration of the COVID-19 declaration under Section 564(b)(1) of the Act, 21 U.S.C. section 360bbb-3(b)(1), unless the authorization is terminated or revoked.     Resp Syncytial Virus by PCR NEGATIVE NEGATIVE Final    Comment: (NOTE) Fact Sheet for  Patients: BloggerCourse.com  Fact Sheet for Healthcare Providers: SeriousBroker.it  This test is not yet approved or cleared by the [place] FDA and has been authorized for detection and/or diagnosis of SARS-CoV-2 by FDA under an Emergency Use Authorization (EUA). This EUA will remain in effect (meaning this test can be used) for the duration of the COVID-19 declaration under Section 564(b)(1) of the Act, 21 U.S.C. section 360bbb-3(b)(1), unless the authorization is terminated or revoked.  Performed at [facility], [address] [place], [place] [address]   Blood culture (routine x 2)     Status: None   Collection Time: 04/09/23 10:20 AM   Specimen: BLOOD  Result Value Ref Range Status   Specimen Description   Final    BLOOD SITE NOT SPECIFIED Performed at [facility], [address] [place], [place] [address]    Special Requests   Final    BOTTLES DRAWN AEROBIC AND ANAEROBIC Blood Culture adequate volume Performed at [facility], [address], [place], [place] [address]    Culture   Final    NO GROWTH 5 DAYS Performed at [facility], [address], [place], [place] [address]    Report Status 04/14/2023 FINAL  Final  Blood culture (routine x 2)     Status: None   Collection Time: 04/09/23 10:30 AM   Specimen: BLOOD  Result Value Ref Range Status   Specimen Description   Final    BLOOD SITE NOT SPECIFIED Performed at [facility], [address] [place], [place] [address]    Special Requests   Final    BOTTLES DRAWN AEROBIC AND ANAEROBIC Blood Culture adequate volume Performed at [facility], [address], [place], [place] [address]    Culture   Final    NO GROWTH 5 DAYS Performed at [facility], [address] [place], [place] [address]    Report Status 04/14/2023 FINAL  Final  Surgical pcr  screen     Status: None   Collection Time: 04/11/23  9:35 PM   Specimen: Nasal Mucosa; Nasal Swab  Result Value Ref Range Status   MRSA, PCR NEGATIVE NEGATIVE Final   Staphylococcus aureus NEGATIVE NEGATIVE Final    Comment: (NOTE) The Xpert SA Assay (FDA approved for NASAL specimens in patients 48 years of age and older), is one component of a comprehensive surveillance program. It is not intended to diagnose infection nor to guide or monitor treatment. Performed at [facility], [address], [place], [place] [address]          Radiology Studies: No results found.      Scheduled Meds:  acetaminophen  1,000 mg Oral Q6H   cholecalciferol  5,000 Units Oral Daily   docusate sodium  100 mg Oral BID   enoxaparin (LOVENOX) injection  40  mg Subcutaneous Q24H   fluticasone furoate-vilanterol  1 puff Inhalation Daily   folic acid  2 mg Oral Daily   hydrochlorothiazide  25 mg Oral Daily   levothyroxine  50 mcg Oral QAC breakfast   pantoprazole  40 mg Oral Daily   polyethylene glycol  17 g Oral Daily   potassium & sodium phosphates  1 packet Oral TID WC & HS   potassium chloride  10 mEq Oral BID   primidone  25 mg Oral QHS   salsalate  1,500 mg Oral BID   timolol  1 drop Both Eyes BID   Continuous Infusions:     LOS: 8 days    Time spent: 25 minutes    Dorcas Carrow, MD [facility]

## 2023-04-17 NOTE — Progress Notes (Signed)
   Progress Note  5 Days Post-Op  Subjective: Pt denies pain at incision. Some firmness in left groin. She is hoping to improve mobilization enough to go home. Tolerating diet and passing flatus but feels like she may need something a little stronger than colace.   Objective: Vital signs in last 24 hours: Temp:  [97.9 F (36.6 C)-98.8 F (37.1 C)] 98 F (36.7 C) (11/11 0504) Pulse Rate:  [63-80] 72 (11/11 0504) Resp:  [12-20] 12 (11/11 0504) BP: (103-114)/(43-55) 114/55 (11/11 0504) SpO2:  [95 %-100 %] 97 % (11/11 0735) Last BM Date : 04/15/23  Intake/Output from previous day: 11/10 0701 - 11/11 0700 In: 1200 [P.O.:1200] Out: 2450 [Urine:2450] Intake/Output this shift: No intake/output data recorded.  PE: General: pleasant, WD, elderly female who is up in chair in NAD Lungs: Respiratory effort nonlabored Abd: soft, NT, ND, L groin incision with steri strips present, some mild edema and firmness, no purulent drainage noted  MS: all 4 extremities are symmetrical with no cyanosis, clubbing, or edema. Psych: A&Ox3 with an appropriate affect.    Lab Results:  No results for input(s): "WBC", "HGB", "HCT", "PLT" in the last 72 hours. BMET No results for input(s): "NA", "K", "CL", "CO2", "GLUCOSE", "BUN", "CREATININE", "CALCIUM" in the last 72 hours. PT/INR No results for input(s): "LABPROT", "INR" in the last 72 hours. CMP     Component Value Date/Time   NA 135 04/14/2023 0544   K 3.6 04/14/2023 0544   CL 97 (L) 04/14/2023 0544   CO2 28 04/14/2023 0544   GLUCOSE 127 (H) 04/14/2023 0544   BUN 19 04/14/2023 0544   CREATININE 0.54 04/14/2023 0544   CREATININE 0.78 03/16/2023 1034   CALCIUM 8.5 (L) 04/14/2023 0544   PROT 5.3 (L) 04/14/2023 0544   ALBUMIN 2.7 (L) 04/14/2023 0544   AST 15 04/14/2023 0544   AST 19 03/16/2023 1034   ALT 9 04/14/2023 0544   ALT 11 03/16/2023 1034   ALKPHOS 36 (L) 04/14/2023 0544   BILITOT 0.4 04/14/2023 0544   BILITOT 0.4 03/16/2023 1034    GFRNONAA >60 04/14/2023 0544   GFRNONAA >60 03/16/2023 1034   Lipase     Component Value Date/Time   LIPASE 47 04/09/2023 1020       Studies/Results: No results found.  Anti-infectives: Anti-infectives (From admission, onward)    Start     Dose/Rate Route Frequency Ordered Stop   04/12/23 1100  ceFAZolin (ANCEF) IVPB 2g/100 mL premix        2 g 200 mL/hr over 30 Minutes Intravenous  Once 04/11/23 1614 04/12/23 1311        Assessment/Plan  S/P open repair of left femoral hernia and recurrent inguinal hernia with mesh 04/12/23 Dr. Corliss Skains - tolerating diet and having some bowel function but will increase bowel regimen - some edema around incision - alternate ice/heat appears more like hematoma/seroma  -  mobilize as tolerated - stable for discharge from surgical perspective once medically cleared and dispo determined. Will arrange follow up and put in AVS  FEN: soft diet, SLIV VTE: LMWH ID: ancef pre-op  LOS: 8 days      Kayla Hurley, [facility] Surgery 04/17/2023, 9:49 AM Please see Amion for pager number during day hours 7:00am-4:30pm

## 2023-04-17 NOTE — Progress Notes (Signed)
Physical Therapy Treatment Patient Details Name: Kayla Hurley MRN: [id number] DOB: May 19, 1935 Today's Date: 04/17/2023   History of Present Illness 88 y.o. female with medical history significant of COPD, COVID-19, glaucoma, hypertensive retinopathy, macular degeneration, meningioma, rheumatoid arthritis, essential hypertension, hyperlipidemia, essential tremor, hypothyroidism, osteoarthritis, rheumatoid arthritis, thrombocytopenia who had 2 falls at home last night resulting in a contusion on her chest and multiple abrasions and ecchymosis on her arms. Found to have incarcerated left inguinal hernia with a small bowel loop.  Mild ascites.  Patient was given mild sedation and hernia was reduced at the bedside by surgery and patient admitted to the hospital. s/p hernia repair 04/12/23.    PT Comments   Pt admitted with above diagnosis.  Pt currently with functional limitations due to the deficits listed below (see PT Problem List). Pt seated in recliner when PT arrived. Pt agreeable to therapy intervention. Trial with use of SPC for functional mobility tasks. Pt indicated she does not want to really use the cane and is electing to reach for items in room and hallway. Ed provided on use of SPC for safety and fall risk prevention. Pt required CGA for transfer and gait tasks of 60 feet with pt limited due to reports of light headedness. Pt reported tingling B LE and noted B edema with B LE elevated pre/post therapy intervention. Please see below for bp. Pt attributes light headedness to medications. Pt left seated in recliner and all needs in place. Nurse aware of Bp findings. Pt will benefit from acute skilled PT to increase their independence and safety with mobility to allow discharge.    Bp seated following gait 115/54 (83 PR) Bp immediate standing 94/56 (88 PR) Bp s/p 3 min static standing 105/53 (92 PR)    If plan is discharge home, recommend the following: A little help with walking and/or  transfers;A little help with bathing/dressing/bathroom;Assistance with cooking/housework;Help with stairs or ramp for entrance   Can travel by private vehicle     Yes  Equipment Recommendations  None recommended by PT    Recommendations for Other Services       Precautions / Restrictions Precautions Precautions: Fall Precaution Comments: orthostatic hypotension Restrictions Weight Bearing Restrictions: No     Mobility  Bed Mobility               General bed mobility comments: pt in recliner on arrival    Transfers Overall transfer level: Needs assistance Equipment used: Straight cane Transfers: Sit to/from Stand Sit to Stand: Contact guard assist           General transfer comment: verbal cues for hand placement    Ambulation/Gait Ambulation/Gait assistance: Contact guard assist Gait Distance (Feet): 60 Feet Assistive device: Straight cane Gait Pattern/deviations: Decreased stride length, Step-to pattern Gait velocity: decr     General Gait Details: verbal cues posture and encouragement to use SPC, pt reports she furniture walks in home setting and uses SPC outside. pt states she feels like her legs are getting a better workout if she just holds onto the railing, pt ed provided on importance of use of AD for safety and stabiltiy and pt disregarded. pt indicated feeling dizzy and attrubuted to medications   Stairs             Wheelchair Mobility     Tilt Bed    Modified Rankin (Stroke Patients Only)       Balance Overall balance assessment: History of Falls   Sitting balance-Leahy Scale: Good  Standing balance support: Single extremity supported, During functional activity Standing balance-Leahy Scale: Poor Standing balance comment: reaching for support to self steady with standing                            Cognition Arousal: Alert Behavior During Therapy: WFL for tasks assessed/performed Overall Cognitive Status:  Within Functional Limits for tasks assessed                                          Exercises      General Comments        Pertinent Vitals/Pain Pain Assessment Pain Assessment: No/denies pain    Home Living                          Prior Function            PT Goals (current goals can now be found in the care plan section) Acute Rehab PT Goals Patient Stated Goal: eventually return to OPPT PT Goal Formulation: With patient Time For Goal Achievement: 04/28/23 Potential to Achieve Goals: Good Progress towards PT goals: Progressing toward goals    Frequency    Min 1X/week      PT Plan      Co-evaluation              AM-PAC PT "6 Clicks" Mobility   Outcome Measure  Help needed turning from your back to your side while in a flat bed without using bedrails?: A Little Help needed moving from lying on your back to sitting on the side of a flat bed without using bedrails?: A Little Help needed moving to and from a bed to a chair (including a wheelchair)?: A Little Help needed standing up from a chair using your arms (e.g., wheelchair or bedside chair)?: A Little Help needed to walk in hospital room?: A Little Help needed climbing 3-5 steps with a railing? : A Lot 6 Click Score: 17    End of Session Equipment Utilized During Treatment: Gait belt Activity Tolerance: Patient limited by fatigue;Treatment limited secondary to medical complications (Comment) (hypotension) Patient left: in chair;with call bell/phone within reach;with chair alarm set Nurse Communication: Mobility status;Other (comment) (bp findings) PT Visit Diagnosis: Difficulty in walking, not elsewhere classified (R26.2);Muscle weakness (generalized) (M62.81)     Time: 4696-2952 PT Time Calculation (min) (ACUTE ONLY): 26 min  Charges:    $Gait Training: 8-22 mins $Therapeutic Activity: 8-22 mins PT General Charges $$ ACUTE PT VISIT: 1 Visit                      Kayla Hurley, PT Acute Rehab    Kayla Hurley 04/17/2023, 3:26 PM

## 2023-04-17 NOTE — Plan of Care (Signed)
  Problem: Education: Goal: Knowledge of General Education information will improve Description: Including pain rating scale, medication(s)/side effects and non-pharmacologic comfort measures Outcome: Progressing   Problem: Health Behavior/Discharge Planning: Goal: Ability to manage health-related needs will improve Outcome: Progressing   Problem: Clinical Measurements: Goal: Ability to maintain clinical measurements within normal limits will improve Outcome: Progressing Goal: Will remain free from infection Outcome: Progressing Goal: Diagnostic test results will improve Outcome: Progressing Goal: Respiratory complications will improve Outcome: Progressing Goal: Cardiovascular complication will be avoided Outcome: Progressing   Problem: Activity: Goal: Risk for activity intolerance will decrease Outcome: Progressing   Problem: Nutrition: Goal: Adequate nutrition will be maintained Outcome: Progressing   Problem: Coping: Goal: Level of anxiety will decrease Outcome: Progressing   Problem: Elimination: Goal: Will not experience complications related to bowel motility Outcome: Progressing Goal: Will not experience complications related to urinary retention Outcome: Progressing   Problem: Pain Management: Goal: General experience of comfort will improve Outcome: Progressing   Problem: Safety: Goal: Ability to remain free from injury will improve Outcome: Progressing   Problem: Skin Integrity: Goal: Risk for impaired skin integrity will decrease Outcome: Progressing   Problem: Clinical Measurements: Goal: Ability to maintain clinical measurements within normal limits will improve Outcome: Progressing Goal: Postoperative complications will be avoided or minimized Outcome: Progressing   Problem: Skin Integrity: Goal: Demonstration of wound healing without infection will improve Outcome: Progressing

## 2023-04-18 DIAGNOSIS — K56609 Unspecified intestinal obstruction, unspecified as to partial versus complete obstruction: Secondary | ICD-10-CM | POA: Diagnosis not present

## 2023-04-18 NOTE — Progress Notes (Signed)
Mobility Specialist - Progress Note   04/18/23 1157  Mobility  Activity Ambulated with assistance in hallway  Level of Assistance Contact guard assist, steadying assist  Assistive Device Other (Comment) (HHA)  Distance Ambulated (ft) 480 ft  Range of Motion/Exercises Active  Activity Response Tolerated well  Mobility Referral Yes  $Mobility charge 1 Mobility  Mobility Specialist Start Time (ACUTE ONLY) 1145  Mobility Specialist Stop Time (ACUTE ONLY) 1157  Mobility Specialist Time Calculation (min) (ACUTE ONLY) 12 min   Pt requesting to ambulate. Had x2 LOB during session and able to recover with assistance. Pt stated feeling less steady today than previous days. Cued pt to slow down and focus on ambulating due to pt getting distracted easily. At EOS returned to recliner chair with all needs met. Call bell in reach and chair alarm on.  Billey Chang Mobility Specialist

## 2023-04-18 NOTE — Plan of Care (Signed)
  Problem: Education: Goal: Knowledge of General Education information will improve Description: Including pain rating scale, medication(s)/side effects and non-pharmacologic comfort measures Outcome: Progressing   Problem: Health Behavior/Discharge Planning: Goal: Ability to manage health-related needs will improve Outcome: Progressing   Problem: Clinical Measurements: Goal: Ability to maintain clinical measurements within normal limits will improve Outcome: Progressing Goal: Will remain free from infection Outcome: Progressing Goal: Diagnostic test results will improve Outcome: Progressing Goal: Respiratory complications will improve Outcome: Progressing Goal: Cardiovascular complication will be avoided Outcome: Progressing   Problem: Activity: Goal: Risk for activity intolerance will decrease Outcome: Progressing   Problem: Nutrition: Goal: Adequate nutrition will be maintained Outcome: Progressing   Problem: Coping: Goal: Level of anxiety will decrease Outcome: Progressing   Problem: Elimination: Goal: Will not experience complications related to bowel motility Outcome: Progressing Goal: Will not experience complications related to urinary retention Outcome: Progressing   Problem: Pain Management: Goal: General experience of comfort will improve Outcome: Progressing   Problem: Safety: Goal: Ability to remain free from injury will improve Outcome: Progressing   Problem: Skin Integrity: Goal: Risk for impaired skin integrity will decrease Outcome: Progressing   Problem: Clinical Measurements: Goal: Ability to maintain clinical measurements within normal limits will improve Outcome: Progressing Goal: Postoperative complications will be avoided or minimized Outcome: Progressing   Problem: Skin Integrity: Goal: Demonstration of wound healing without infection will improve Outcome: Progressing

## 2023-04-18 NOTE — Progress Notes (Signed)
PROGRESS NOTE    Kayla Hurley  ZOX:[id number] DOB: May 18, 1935 DOA: 04/09/2023 PCP: Pcp, No    Brief Narrative:  88 year old with history of COPD and nocturnal hypoxemia that she uses 2 L oxygen at night.  macular degeneration, rheumatoid arthritis on methotrexate, hypertension, hyperlipidemia and essential tremors, hypothyroidism who had fall at home without prodromal symptoms.  She felt like she had flu.  She was also complaining of left lower quadrant pain due to chronic left inguinal hernia that she used to reduce herself however last night she could not do it.  She also had multiple episodes of vomiting.  Initially hemodynamically stable, on 2 L oxygen.  Found to have incarcerated left inguinal hernia with a small bowel loop.  Mild ascites.  Patient was given mild sedation and hernia was reduced at the bedside by surgery and patient admitted to the hospital.  Patient underwent left inguinal and femoral hernia repair. Medically stable.  Waiting for SNF bed.  Subjective:  Seen and examined.  No new events.  Pain is controlled.  No bowel movement for last 2 days however denies any abdominal pain.  She has some rashes around the incision line.  Wants to walk and wants to stay off alarms.  Assessment & Plan:   Incarcerated left inguinal hernia with small bowel obstruction: Underwent exploration, inguinal hernia and femoral hernia repair on the left side.  Encourage mobility, good pain control.  Stool softener and MiraLAX.  Regular diet.   Fall and soft tissue injuries: Fairly stable.  Skeletal survey was negative.  Chronic medical issues COPD with asthma, currently not on any exacerbation.  Resumed Breo.  Resumed bronchodilators.  Hypothyroidism: Resumed home dose of levothyroxine.  Electrolytes: Replaced and adequate.    Glaucoma: On timolol drops.  Rheumatoid arthritis: Patient is on methotrexate.  As she had small incision and relatively healing well, she was given her dose of  methotrexate.    Hypophosphatemia: Will keep on replacement for 5 days.  Medically stable.  Waiting for SNF bed.  Continue to work with PT OT and mobility tech.   DVT prophylaxis: enoxaparin (LOVENOX) injection 40 mg Start: 04/13/23 1000 SCDs Start: 04/09/23 1450   Code Status: Full code Family Communication: None . Disposition Plan: Status is: Inpatient Remains inpatient appropriate because: Medically stable.  She will need a skilled nursing facility on discharge.     Consultants:  General surgery  Procedures:  Hernia repair  Antimicrobials:  None     Objective: Vitals:   04/17/23 0735 04/17/23 1458 04/17/23 2112 04/18/23 0419  BP:  112/62 (!) 127/53 (!) 121/49  Pulse:  86 86 73  Resp:  18 17 17   Temp:  98.9 F (37.2 C) 98.6 F (37 C) 98.2 F (36.8 C)  TempSrc:  Oral Oral Oral  SpO2: 97% 96% 99% 98%  Weight:      Height:        Intake/Output Summary (Last 24 hours) at 04/18/2023 1154 Last data filed at 04/18/2023 1105 Gross per 24 hour  Intake 1320 ml  Output 550 ml  Net 770 ml   Filed Weights   04/09/23 0914 04/12/23 1055  Weight: 59 kg 59 kg    Examination:  Looks fairly comfortable.  Alert awake and oriented.  Interactive.  Pleasant. She has some ecchymosis anterior chest wall that is fading now. Left inguinal incision clean and dry.  She has some rashes along the inguinal folds.     Data Reviewed: I have personally reviewed following labs  and imaging studies  CBC: Recent Labs  Lab 04/14/23 0544  WBC 9.2  NEUTROABS 5.8  HGB 12.0  HCT 35.4*  MCV 94.9  PLT 108*   Basic Metabolic Panel: Recent Labs  Lab 04/14/23 0544  NA 135  K 3.6  CL 97*  CO2 28  GLUCOSE 127*  BUN 19  CREATININE 0.54  CALCIUM 8.5*  MG 1.9  PHOS 1.4*   GFR: Estimated Creatinine Clearance: 41.7 mL/min (by C-G formula based on SCr of 0.54 mg/dL). Liver Function Tests: Recent Labs  Lab 04/14/23 0544  AST 15  ALT 9  ALKPHOS 36*  BILITOT 0.4  PROT  5.3*  ALBUMIN 2.7*   No results for input(s): "LIPASE", "AMYLASE" in the last 168 hours.  No results for input(s): "AMMONIA" in the last 168 hours. Coagulation Profile: No results for input(s): "INR", "PROTIME" in the last 168 hours. Cardiac Enzymes: No results for input(s): "CKTOTAL", "CKMB", "CKMBINDEX", "TROPONINI" in the last 168 hours. BNP (last 3 results) No results for input(s): "PROBNP" in the last 8760 hours. HbA1C: No results for input(s): "HGBA1C" in the last 72 hours. CBG: No results for input(s): "GLUCAP" in the last 168 hours. Lipid Profile: No results for input(s): "CHOL", "HDL", "LDLCALC", "TRIG", "CHOLHDL", "LDLDIRECT" in the last 72 hours. Thyroid Function Tests: No results for input(s): "TSH", "T4TOTAL", "FREET4", "T3FREE", "THYROIDAB" in the last 72 hours.  Anemia Panel: No results for input(s): "VITAMINB12", "FOLATE", "FERRITIN", "TIBC", "IRON", "RETICCTPCT" in the last 72 hours. Sepsis Labs: No results for input(s): "PROCALCITON", "LATICACIDVEN" in the last 168 hours.   Recent Results (from the past 240 hour(s))  Resp panel by RT-PCR (RSV, Flu A&B, Covid) Anterior Nasal Swab     Status: None   Collection Time: 04/09/23 10:12 AM   Specimen: Anterior Nasal Swab  Result Value Ref Range Status   SARS Coronavirus 2 by RT PCR NEGATIVE NEGATIVE Final    Comment: (NOTE) SARS-CoV-2 target nucleic acids are NOT DETECTED.  The SARS-CoV-2 RNA is generally detectable in upper respiratory specimens during the acute phase of infection. The lowest concentration of SARS-CoV-2 viral copies this assay can detect is 138 copies/mL. A negative result does not preclude SARS-Cov-2 infection and should not be used as the sole basis for treatment or other patient management decisions. A negative result may occur with  improper specimen collection/handling, submission of specimen other than nasopharyngeal swab, presence of viral mutation(s) within the areas targeted by this  assay, and inadequate number of viral copies(<138 copies/mL). A negative result must be combined with clinical observations, patient history, and epidemiological information. The expected result is Negative.  Fact Sheet for Patients:  BloggerCourse.com  Fact Sheet for Healthcare Providers:  SeriousBroker.it  This test is no t yet approved or cleared by the [place] FDA and  has been authorized for detection and/or diagnosis of SARS-CoV-2 by FDA under an Emergency Use Authorization (EUA). This EUA will remain  in effect (meaning this test can be used) for the duration of the COVID-19 declaration under Section 564(b)(1) of the Act, 21 U.S.C.section 360bbb-3(b)(1), unless the authorization is terminated  or revoked sooner.       Influenza A by PCR NEGATIVE NEGATIVE Final   Influenza B by PCR NEGATIVE NEGATIVE Final    Comment: (NOTE) The Xpert Xpress SARS-CoV-2/FLU/RSV plus assay is intended as an aid in the diagnosis of influenza from Nasopharyngeal swab specimens and should not be used as a sole basis for treatment. Nasal washings and aspirates are unacceptable for Xpert  Xpress SARS-CoV-2/FLU/RSV testing.  Fact Sheet for Patients: BloggerCourse.com  Fact Sheet for Healthcare Providers: SeriousBroker.it  This test is not yet approved or cleared by the [place] FDA and has been authorized for detection and/or diagnosis of SARS-CoV-2 by FDA under an Emergency Use Authorization (EUA). This EUA will remain in effect (meaning this test can be used) for the duration of the COVID-19 declaration under Section 564(b)(1) of the Act, 21 U.S.C. section 360bbb-3(b)(1), unless the authorization is terminated or revoked.     Resp Syncytial Virus by PCR NEGATIVE NEGATIVE Final    Comment: (NOTE) Fact Sheet for Patients: BloggerCourse.com  Fact Sheet for  Healthcare Providers: SeriousBroker.it  This test is not yet approved or cleared by the [place] FDA and has been authorized for detection and/or diagnosis of SARS-CoV-2 by FDA under an Emergency Use Authorization (EUA). This EUA will remain in effect (meaning this test can be used) for the duration of the COVID-19 declaration under Section 564(b)(1) of the Act, 21 U.S.C. section 360bbb-3(b)(1), unless the authorization is terminated or revoked.  Performed at [facility], [address] [place], [place] [address]   Blood culture (routine x 2)     Status: None   Collection Time: 04/09/23 10:20 AM   Specimen: BLOOD  Result Value Ref Range Status   Specimen Description   Final    BLOOD SITE NOT SPECIFIED Performed at [facility], [address] [place], [place] [address]    Special Requests   Final    BOTTLES DRAWN AEROBIC AND ANAEROBIC Blood Culture adequate volume Performed at [facility], [address] [place], [place] [address]    Culture   Final    NO GROWTH 5 DAYS Performed at [facility], [address] [place], [place] [address]    Report Status 04/14/2023 FINAL  Final  Blood culture (routine x 2)     Status: None   Collection Time: 04/09/23 10:30 AM   Specimen: BLOOD  Result Value Ref Range Status   Specimen Description   Final    BLOOD SITE NOT SPECIFIED Performed at [facility], [address] [place], [place] [address]    Special Requests   Final    BOTTLES DRAWN AEROBIC AND ANAEROBIC Blood Culture adequate volume Performed at [facility], [address] [place], [place] [address]    Culture   Final    NO GROWTH 5 DAYS Performed at [facility], [address], [place], [place] [address]    Report Status 04/14/2023 FINAL  Final  Surgical pcr screen     Status: None   Collection Time: 04/11/23  9:35 PM    Specimen: Nasal Mucosa; Nasal Swab  Result Value Ref Range Status   MRSA, PCR NEGATIVE NEGATIVE Final   Staphylococcus aureus NEGATIVE NEGATIVE Final    Comment: (NOTE) The Xpert SA Assay (FDA approved for NASAL specimens in patients 71 years of age and older), is one component of a comprehensive surveillance program. It is not intended to diagnose infection nor to guide or monitor treatment. Performed at [facility], [address], [place], [place] [address]          Radiology Studies: No results found.      Scheduled Meds:  acetaminophen  1,000 mg Oral Q6H   cholecalciferol  5,000 Units Oral Daily   docusate sodium  100 mg Oral BID   enoxaparin (LOVENOX) injection  40 mg Subcutaneous Q24H   fluticasone furoate-vilanterol  1 puff Inhalation Daily   folic acid  2 mg Oral Daily   hydrochlorothiazide  25 mg Oral Daily   levothyroxine  50 mcg Oral QAC breakfast   pantoprazole  40 mg Oral Daily   polyethylene glycol  17 g Oral Daily   potassium & sodium phosphates  1 packet Oral TID WC & HS   potassium chloride  10 mEq Oral BID   primidone  25 mg Oral QHS   salsalate  1,500 mg Oral BID   timolol  1 drop Both Eyes BID   Continuous Infusions:     LOS: 9 days    Time spent: 25 minutes    Dorcas Carrow, MD [facility]

## 2023-04-18 NOTE — Plan of Care (Signed)
  Problem: Elimination: Goal: Will not experience complications related to bowel motility Outcome: Progressing   Problem: Pain Management: Goal: General experience of comfort will improve Outcome: Progressing   Problem: Safety: Goal: Ability to remain free from injury will improve Outcome: Progressing

## 2023-04-18 NOTE — TOC Progression Note (Addendum)
Transition of Care [facility]) - Progression Note    Patient Details  Name: Kayla Hurley MRN: [id number] Date of Birth: [birth year]  Transition of Care [facility]) CM/SW Contact  Kayla Hurley, Kayla Messier, RN Phone Number: 04/18/2023, 1:35 PM  Clinical Narrative:  Bed offers given awaiting choice.  -3:17p-patient per nsg chose Kayla Hurley can accept tomorrow. Spoke to son Kayla Hurley aware & agrees.MD updated.    Expected Discharge Plan: Skilled Nursing Facility Barriers to Discharge: Continued Medical Work up  Expected Discharge Plan and Services   Discharge Planning Services: CM Consult Post Acute Care Choice: Skilled Nursing Facility Living arrangements for the past 2 months: Single Family Home                                       Social Determinants of Health (SDOH) Interventions SDOH Screenings   Food Insecurity: No Food Insecurity (04/09/2023)  Housing: Patient Declined (04/09/2023)  Transportation Needs: No Transportation Needs (04/09/2023)  Utilities: Not At Risk (04/09/2023)  Tobacco Use: Medium Risk (04/12/2023)    Readmission Risk Interventions     No data to display

## 2023-04-18 NOTE — Progress Notes (Signed)
Occupational Therapy Treatment Patient Details Name: Kayla Hurley MRN: [id number] DOB: 1935-04-11 Today's Date: 04/18/2023   History of present illness 88 y.o. female with medical history significant of COPD, COVID-19, glaucoma, hypertensive retinopathy, macular degeneration, meningioma, rheumatoid arthritis, essential hypertension, hyperlipidemia, essential tremor, hypothyroidism, osteoarthritis, rheumatoid arthritis, thrombocytopenia who had 2 falls at home last night resulting in a contusion on her chest and multiple abrasions and ecchymosis on her arms. Found to have incarcerated left inguinal hernia with a small bowel loop.  Mild ascites.  Patient was given mild sedation and hernia was reduced at the bedside by surgery and patient admitted to the hospital. s/p hernia repair 04/12/23.   OT comments  Pt requests to work on balance and mobility. Requires supervision overall for sit to stands, completes functional mobility with RW upon therapist encouragement. Functioning at overall CGA - supervision for ADL tasks. Mobility in hallway for ~100 ft using RW. Pt is a risk for falls based on continued assessment (30 second sit to stand test completed). OT will continue to follow for functional gains. Patient will benefit from continued inpatient follow up therapy, <3 hours/day       If plan is discharge home, recommend the following:  Assistance with cooking/housework;Assist for transportation;Help with stairs or ramp for entrance;A little help with walking and/or transfers;A little help with bathing/dressing/bathroom   Equipment Recommendations  BSC/3in1    Recommendations for Other Services PT consult    Precautions / Restrictions Precautions Precautions: Fall Restrictions Weight Bearing Restrictions: No       Mobility Bed Mobility                    Transfers Overall transfer level: Needs assistance Equipment used: None Transfers: Sit to/from Stand Sit to Stand:  Supervision           General transfer comment: pt able to complete with no vcs for hand placement, steady in standing once upright     Balance Overall balance assessment: History of Falls   Sitting balance-Leahy Scale: Good     Standing balance support: During functional activity, Bilateral upper extremity supported Standing balance-Leahy Scale: Fair Standing balance comment: CGA with RW progressing to supervision, pt will reach for counters to steady self if no AD used                           ADL either performed or assessed with clinical judgement   ADL Overall ADL's : Needs assistance/impaired                         Toilet Transfer: Radiographer, therapeutic Details (indicate cue type and reason): multiple STS transfers from recliner, supervision         Functional mobility during ADLs: Rolling walker (2 wheels) General ADL Comments: pt agrees to use RW for hallway mobility after encourgement from OT      Cognition Arousal: Alert Behavior During Therapy: [facility] for tasks assessed/performed Overall Cognitive Status: Within Functional Limits for tasks assessed                                 General Comments: pt unable to remember this Clinical research associate from yesterday afternoon        Exercises Exercises: Other exercises Other Exercises Other Exercises: Discussed fall prevention strategies, pt with poor recall and carryover from yesterday's OT session Other  Exercises: pt completed 30 second sit to stand test, ultimately unable to complete test as indicatd without use of BUE. Performs 9 reps in 30 sec time frame using hands to propel self upright, which puts pt at a risk for falls. discussed with pt.       General Comments sitting BP 138/61 (84)    Pertinent Vitals/ Pain       Pain Assessment Pain Assessment: No/denies pain         Frequency  Min 1X/week        Progress Toward Goals  OT Goals(current goals can now  be found in the care plan section)  Progress towards OT goals: Progressing toward goals  Acute Rehab OT Goals OT Goal Formulation: With patient Time For Goal Achievement: 04/25/23 Potential to Achieve Goals: Good  Plan         AM-PAC OT "6 Clicks" Daily Activity     Outcome Measure   Help from another person eating meals?: None Help from another person taking care of personal grooming?: A Little Help from another person toileting, which includes using toliet, bedpan, or urinal?: A Little Help from another person bathing (including washing, rinsing, drying)?: A Little Help from another person to put on and taking off regular upper body clothing?: A Little Help from another person to put on and taking off regular lower body clothing?: A Little 6 Click Score: 19    End of Session Equipment Utilized During Treatment: Gait belt  OT Visit Diagnosis: History of falling (Z91.81);Muscle weakness (generalized) (M62.81)   Activity Tolerance Patient tolerated treatment well   Patient Left in chair;with call bell/phone within reach;with chair alarm set (alarm activated)   Nurse Communication          Time: 0272-5366 OT Time Calculation (min): 26 min  Charges: OT General Charges $OT Visit: 1 Visit OT Treatments $Self Care/Home Management : 8-22 mins $Therapeutic Activity: 8-22 mins  Kacy Hegna L. Nailea Whitehorn, OTR/L  04/18/23, 3:54 PM

## 2023-04-18 NOTE — Progress Notes (Signed)
Mobility Specialist - Progress Note   04/18/23 0956  Mobility  Activity Ambulated with assistance in room;Ambulated with assistance to bathroom  Level of Assistance Contact guard assist, steadying assist  Assistive Device Other (Comment) (HHA)  Distance Ambulated (ft) 30 ft  Range of Motion/Exercises Active  Activity Response Tolerated well  Mobility Referral Yes  $Mobility charge 1 Mobility  Mobility Specialist Start Time (ACUTE ONLY) 0930  Mobility Specialist Stop Time (ACUTE ONLY) 0956  Mobility Specialist Time Calculation (min) (ACUTE ONLY) 26 min   Pt was found on recliner chair wanting assistance to bathroom. Stated wanting to ambulate in room to assist having a BM. Pt to bathroom afterwards and at EOS returned to recliner chair with all needs met. Call bell in reach and chair alarm on.  Billey Chang Mobility Specialist

## 2023-04-19 DIAGNOSIS — K56609 Unspecified intestinal obstruction, unspecified as to partial versus complete obstruction: Secondary | ICD-10-CM | POA: Diagnosis not present

## 2023-04-19 MED ORDER — POTASSIUM & SODIUM PHOSPHATES 280-160-250 MG PO PACK: 1.0000 | PACK | Freq: Three times a day (TID) | ORAL | Status: AC

## 2023-04-19 MED ORDER — ACETAMINOPHEN 500 MG PO TABS: 1000.0000 mg | ORAL_TABLET | Freq: Four times a day (QID) | ORAL | 0 refills | Status: AC | PRN

## 2023-04-19 MED ORDER — DOCUSATE SODIUM 100 MG PO CAPS: 100.0000 mg | ORAL_CAPSULE | Freq: Two times a day (BID) | ORAL | Status: AC

## 2023-04-19 MED ORDER — POLYETHYLENE GLYCOL 3350 17 G PO PACK: 17.0000 g | PACK | Freq: Every day | ORAL | Status: AC

## 2023-04-19 NOTE — Progress Notes (Signed)
Mobility Specialist - Progress Note   04/19/23 1057  Mobility  Activity Ambulated with assistance in hallway  Level of Assistance Modified independent, requires aide device or extra time  Assistive Device Other (Comment) (HHA)  Distance Ambulated (ft) 350 ft  Range of Motion/Exercises Active  Activity Response Tolerated well  Mobility Referral Yes  $Mobility charge 1 Mobility  Mobility Specialist Start Time (ACUTE ONLY) 1020  Mobility Specialist Stop Time (ACUTE ONLY) 1039  Mobility Specialist Time Calculation (min) (ACUTE ONLY) 19 min   Received in chair and agreed to mobility. Arm in arm assist throughout session and returned to chair with all needs met.  Marilynne Halsted Mobility Specialist

## 2023-04-19 NOTE — TOC Transition Note (Addendum)
Transition of Care [facility]) - CM/SW Discharge Note   Patient Details  Name: Kayla Hurley MRN: [id number] Date of Birth: [birth year]  Transition [facility]) CM/SW Contact:  Lanier Clam, RN Phone Number: 04/19/2023, 9:43 AM   Clinical Narrative:  d/c today [facility] rep [facility] aware. Await rm#,tel# report prior PTAR.  -12p d/c summary sent. Going to [employer] [employer] rm#[id number],report tel#[phone]. PTAR called. No further CM needs.    Final next level of care: Skilled Nursing Facility Barriers to Discharge: No Barriers Identified   Patient Goals and CMS Choice CMS Medicare.gov Compare Post Acute Care list provided to:: Patient Represenative (must comment) (Chris(Son) Linda(sister)) Choice offered to / list presented to : Adult Children  Discharge Placement                Patient chooses bed at: [facility] Patient to be transferred to facility by: PTAR Name of family member notified: Chris(Son) Patient and family notified of of transfer: 04/19/23  Discharge Plan and Services Additional resources added to the After Visit Summary for     Discharge Planning Services: CM Consult Post Acute Care Choice: Skilled Nursing Facility                               Social Determinants of Health (SDOH) Interventions SDOH Screenings   Food Insecurity: No Food Insecurity (04/09/2023)  Housing: Patient Declined (04/09/2023)  Transportation Needs: No Transportation Needs (04/09/2023)  Utilities: Not At Risk (04/09/2023)  Tobacco Use: Medium Risk (04/12/2023)     Readmission Risk Interventions     No data to display

## 2023-04-19 NOTE — Discharge Summary (Signed)
Physician Discharge Summary  Kayla Hurley XBJ:[id number] DOB: [birth year] DOA: 04/09/2023  PCP: Oneita Hurt, No  Admit date: 04/09/2023 Discharge date: 04/19/2023  Admitted From: Home Disposition: Skilled nursing facility  Recommendations for Outpatient Follow-up:  Follow up with PCP in 1-2 weeks Check CBC, CMP, magnesium and phosphorus in 1 week.   Discharge Condition: Stable CODE STATUS: Full code Diet recommendation: Regular diet, nutritional supplements, bowel regimen  Discharge summary: 88 year old with history of COPD and nocturnal hypoxemia, uses 2 L oxygen at night.  macular degeneration, rheumatoid arthritis on methotrexate, hypertension, hyperlipidemia and essential tremors, hypothyroidism who had fall at home without prodromal symptoms.  She felt like she had flu.  She was also complaining of left lower quadrant pain due to chronic left inguinal hernia that she used to reduce herself however last night she could not do it.  She also had multiple episodes of vomiting.  Initially hemodynamically stable, on 2 L oxygen.  Found to have incarcerated left inguinal hernia with incarcerated bowel loop.  Mild ascites.  Patient was given mild sedation and hernia was reduced at the bedside by surgery and patient admitted to the hospital.  Patient underwent left inguinal and femoral hernia repair.  Medically stabilized.  Going to a SNF for rehab.   Assessment & Plan:   Incarcerated left inguinal hernia with small bowel obstruction: Underwent exploration, inguinal hernia and femoral hernia repair on the left side. Encourage mobility, good pain control.  Stool softener and MiraLAX.  Regular diet.  Stabilized.   Fall and soft tissue injuries: Fairly stable.  Skeletal survey was negative.   Chronic medical issues COPD with asthma, currently not on any exacerbation.  Resumed Breo.  Resumed bronchodilators.   Hypothyroidism: Resumed home dose of levothyroxine.   Electrolytes: Replaced and adequate.      Glaucoma: On timolol drops.   Rheumatoid arthritis: Patient is on methotrexate.  As she had small incision and relatively healing well, she was given her dose of methotrexate.  She is on scheduled doses.   Hypophosphatemia: Will keep on replacement.  Recheck potassium and phosphate in 1 week.   Stable for discharge.  Discharge Diagnoses:  Principal Problem:   Small bowel obstruction (HCC) Active Problems:   Essential tremor   COPD with asthma (HCC)   Thrombocytopenia (HCC)   Hypothyroidism   Dyslipidemia   Hypokalemia   Left inguinal hernia   Primary open angle glaucoma    Discharge Instructions  Discharge Instructions     Diet general   Complete by: As directed    Increase activity slowly   Complete by: As directed       Allergies as of 04/19/2023       Reactions   Adhesive [tape] Other (See Comments)   Blisters   Cucumber Extract Other (See Comments)   Exact allergic reaction not cited   Lactose Intolerance (gi) Diarrhea   Penicillins Diarrhea, Nausea And Vomiting, Rash        Medication List     STOP taking these medications    bisacodyl 5 MG EC tablet Commonly known as: DULCOLAX   gabapentin 100 MG capsule Commonly known as: Neurontin   guaiFENesin-dextromethorphan 100-10 MG/5ML syrup Commonly known as: ROBITUSSIN DM   methocarbamol 500 MG tablet Commonly known as: ROBAXIN   simethicone 125 MG chewable tablet Commonly known as: MYLICON       TAKE these medications    acetaminophen 500 MG tablet Commonly known as: TYLENOL Take 2 tablets (1,000 mg total) by mouth every  6 (six) hours as needed.   CAL-MAG-ZINC PO Take 1-2 tablets by mouth daily.   clobetasol 0.05 % external solution Commonly known as: TEMOVATE Apply 1 Application topically 2 (two) times daily as needed (as directed- to affected area(s); avoid face/groin/underarms).   diclofenac Sodium 1 % Gel Commonly known as: VOLTAREN Apply 2 g topically daily as needed  (affected sites- for pain).   docusate sodium 100 MG capsule Commonly known as: COLACE Take 1 capsule (100 mg total) by mouth 2 (two) times daily.   fluticasone 0.05 % cream Commonly known as: CUTIVATE Apply 1 Application topically 2 (two) times daily as needed (as directed- to affected area(s)).   fluticasone furoate-vilanterol 200-25 MCG/ACT Aepb Commonly known as: Breo Ellipta Inhale 1 puff into the lungs daily.   folic acid 1 MG tablet Commonly known as: FOLVITE Take 2 mg by mouth daily.   Gaviscon Extra Strength 160-105 MG Chew Generic drug: Alum Hydroxide-Mag Carbonate Chew 1 tablet by mouth every 6 (six) hours as needed (for gas).   hydrochlorothiazide 25 MG tablet Commonly known as: HYDRODIURIL Take 25 mg by mouth daily.   ketoconazole 2 % cream Commonly known as: NIZORAL Apply 1 Application topically daily as needed for irritation.   leucovorin 5 MG tablet Commonly known as: WELLCOVORIN Take 10 mg by mouth every Friday.   levothyroxine 50 MCG tablet Commonly known as: SYNTHROID Take 50 mcg by mouth daily before breakfast.   methotrexate 2.5 MG tablet Commonly known as: RHEUMATREX Take 4 tablets (10 mg total) by mouth every Thursday. Resume from next week   NON FORMULARY Place 1-2 tablets under the tongue See admin instructions. Hyland's leg cramp tablets- Dissolve 1-2 tablets under the tongue every four hours as needed for leg cramps   polyethylene glycol 17 g packet Commonly known as: MIRALAX / GLYCOLAX Take 17 g by mouth daily.   potassium & sodium phosphates 280-160-250 MG Pack Commonly known as: PHOS-NAK Take 1 packet by mouth 4 (four) times daily -  with meals and at bedtime for 3 days.   potassium chloride 10 MEQ tablet Commonly known as: KLOR-CON Take 10 mEq by mouth in the morning and at bedtime.   PreserVision AREDS 2 Caps Take 1 capsule by mouth in the morning and at bedtime.   primidone 50 MG tablet Commonly known as: MYSOLINE TAKE 1/2  TABLET BY MOUTH AT BEDTIME   salsalate 750 MG tablet Commonly known as: DISALCID Take 1,500 mg by mouth 2 (two) times daily.   timolol 0.5 % ophthalmic solution Commonly known as: TIMOPTIC Place 1 drop into both eyes 2 (two) times daily.   Ventolin HFA 108 (90 Base) MCG/ACT inhaler Generic drug: albuterol Inhale 1 puff into the lungs every 4 (four) hours as needed for shortness of breath.   Vitamin D3 125 MCG (5000 UT) Caps Take 5,000 Units by mouth daily.        Contact information for follow-up providers     Manus Rudd, MD. Go on 05/01/2023.   Specialty: General Surgery Why: 10:20 AM, please arrive 30 min prior to appointment time to check in. Contact information: [address] [place] [address] [phone]              Contact information for after-discharge care     Destination     [facility] Preferred SNF .   Service: Skilled Nursing Contact information: [address] [place] [address] [phone]  Allergies  Allergen Reactions   Adhesive [Tape] Other (See Comments)    Blisters    Cucumber Extract Other (See Comments)    Exact allergic reaction not cited   Lactose Intolerance (Gi) Diarrhea   Penicillins Diarrhea, Nausea And Vomiting and Rash    Consultations: General surgery   Procedures/Studies: DG HIPS BILAT WITH PELVIS 3-4 VIEWS  Result Date: 04/16/2023 CLINICAL DATA:  Coccygeal and groin pain as well as bilateral hip pain after fall a few months ago. EXAM: DG HIP (WITH OR WITHOUT PELVIS) 3-4V BILAT COMPARISON:  None Available. FINDINGS: Exam demonstrates mild-to-moderate osteoarthritic change of the hips left worse than right. No acute fracture or dislocation. Remaining pelvic bones are unremarkable. There is mild degenerative change of the spine and sacroiliac joints. IMPRESSION: 1. No acute findings. 2. Mild-to-moderate osteoarthritic change of the hips  left worse than right. Electronically Signed   By: Elberta Fortis M.D.   On: 04/16/2023 11:52   DG Abd Portable 1V  Result Date: 04/10/2023 CLINICAL DATA:  Small-bowel obstruction EXAM: PORTABLE ABDOMEN - 1 VIEW COMPARISON:  Procedure description CT abdomen/pelvis 1 day prior FINDINGS: Findings diffuse gaseous distention of the small bowel is noted with loops measuring up to 3.7 cm, grossly similar to the prior CT. There is no definite free intraperitoneal air. There is no abnormal soft tissue calcification. Cholecystectomy clips are noted. There is no acute osseous abnormality. IMPRESSION: Gaseous distention of the small bowel is grossly similar to the prior CT, consistent with small-bowel obstruction. Electronically Signed   By: Lesia Hausen M.D.   On: 04/10/2023 14:50   CT Cervical Spine Wo Contrast  Result Date: 04/09/2023 CLINICAL DATA:  Poly trauma, blunt.  Multiple falls. EXAM: CT CERVICAL SPINE WITHOUT CONTRAST TECHNIQUE: Multidetector CT imaging of the cervical spine was performed without intravenous contrast. Multiplanar CT image reconstructions were also generated. RADIATION DOSE REDUCTION: This exam was performed according to the departmental dose-optimization program which includes automated exposure control, adjustment of the mA and/or kV according to patient size and/or use of iterative reconstruction technique. COMPARISON:  CT cervical spine 09/06/2022 FINDINGS: Alignment: Straightening without focal angulation or listhesis. Skull base and vertebrae: No evidence of acute cervical spine fracture or traumatic subluxation. Multilevel spondylosis, similar to previous study. Soft tissues and spinal canal: No prevertebral fluid or swelling. No visible canal hematoma. Disc levels: No evidence of large disc herniation or high-grade spinal stenosis. There is multilevel spondylosis with disc space narrowing, endplate osteophytes and facet hypertrophy at multiple levels. There is evidence for mild  chronic osseous spinal stenosis, greatest at C5-6. Mild foraminal narrowing is present at multiple levels. Upper chest: Stable mild scarring at both lung apices. Other: Bilateral carotid atherosclerosis. IMPRESSION: 1. No evidence of acute cervical spine fracture, traumatic subluxation or static signs of instability. 2. Multilevel cervical spondylosis, similar to previous study. Electronically Signed   By: Carey Bullocks M.D.   On: 04/09/2023 12:31   CT HEAD WO CONTRAST ( )  Result Date: 04/09/2023 CLINICAL DATA:  Multiple falls with bruising on head and neck. History of meningioma resection. EXAM: CT HEAD WITHOUT CONTRAST TECHNIQUE: Contiguous axial images were obtained from the base of the skull through the vertex without intravenous contrast. RADIATION DOSE REDUCTION: This exam was performed according to the departmental dose-optimization program which includes automated exposure control, adjustment of the mA and/or kV according to patient size and/or use of iterative reconstruction technique. COMPARISON:  09/08/2022 FINDINGS: Brain: There is no evidence for acute hemorrhage, hydrocephalus, mass lesion, or  abnormal extra-axial fluid collection. No definite CT evidence for acute infarction. Patchy low attenuation in the deep hemispheric and periventricular white matter is nonspecific, but likely reflects chronic microvascular ischemic demyelination. High right frontal encephalomalacia underlies a craniotomy defect and is stable in the interval. Vascular: No hyperdense vessel or unexpected calcification. Skull: No evidence for fracture. No worrisome lytic or sclerotic lesion. Frontal craniotomy defect noted. Sinuses/Orbits: The visualized paranasal sinuses and mastoid air cells are clear. Visualized portions of the globes and intraorbital fat are unremarkable. Other: None. IMPRESSION: 1. No acute intracranial abnormality. 2. Stable high right frontal encephalomalacia underlying a craniotomy defect. 3. Stable  background chronic small vessel ischemic disease. Electronically Signed   By: Kennith Center M.D.   On: 04/09/2023 12:24   CT CHEST ABDOMEN PELVIS W CONTRAST  Result Date: 04/09/2023 CLINICAL DATA:  Multiple falls. Blunt poly trauma. Chest and abdominal pain. EXAM: CT CHEST, ABDOMEN, AND PELVIS WITH CONTRAST TECHNIQUE: Multidetector CT imaging of the chest, abdomen and pelvis was performed following the standard protocol during bolus administration of intravenous contrast. RADIATION DOSE REDUCTION: This exam was performed according to the departmental dose-optimization program which includes automated exposure control, adjustment of the mA and/or kV according to patient size and/or use of iterative reconstruction technique. CONTRAST:  OMNIPAQUE IOHEXOL 300 MG/ML  SOLN COMPARISON:  None Available. FINDINGS: CT CHEST FINDINGS Cardiovascular: No evidence of thoracic aortic injury or mediastinal hematoma. No pericardial effusion. Mediastinum/Nodes: No evidence of hemorrhage or pneumomediastinum. No masses or pathologically enlarged lymph nodes identified. Mild diffuse esophageal wall thickening, suspicious for esophagitis. Lungs/Pleura: No evidence of pulmonary contusion or acute infiltrate. No evidence of pneumothorax or hemothorax. Mild chronic interstitial lung disease is seen in subpleural regions. Musculoskeletal: No acute fractures or suspicious bone lesions identified. CT ABDOMEN PELVIS FINDINGS Hepatobiliary: No hepatic laceration or mass identified. Prior cholecystectomy. No evidence of biliary obstruction. Pancreas: No parenchymal laceration, mass, or inflammatory changes identified. Spleen: No evidence of splenic laceration. Adrenal/Urinary Tract: No hemorrhage or parenchymal lacerations identified. No evidence of suspicious masses or hydronephrosis. Stomach/Bowel: Diffuse colonic diverticulosis is seen, without signs of diverticulitis. Mild ascites is seen. Moderately dilated small bowel loops are  seen throughout the abdomen and pelvis, with transition point at the site of a small left inguinal hernia containing a single small bowel loop. No evidence of pneumatosis or free intraperitoneal air. Vascular/Lymphatic: No evidence of abdominal aortic injury or retroperitoneal hemorrhage. No pathologically enlarged lymph nodes identified. Reproductive:  No mass or other significant abnormality identified. Other:  None. Musculoskeletal: No acute fractures or suspicious bone lesions identified. IMPRESSION: No evidence of traumatic injury involving the chest, abdomen, or pelvis. Small bowel obstruction due to small left inguinal hernia containing a single small bowel loop. Mild ascites. Colonic diverticulosis, without radiographic evidence of diverticulitis. Mild diffuse esophageal wall thickening, suspicious for esophagitis. Chronic interstitial lung disease. Electronically Signed   By: Danae Orleans M.D.   On: 04/09/2023 12:22   (Echo, Carotid, EGD, Colonoscopy, ERCP)    Subjective: Patient seen and examined.  Denies any complaints.  Denies any pain.  She was able to have a normal bowel movement last night.  She is very excited to be able to go to rehab near to her house .    Discharge Exam: Vitals:   04/18/23 1940 04/19/23 0432  BP: (!) 122/57 (!) 105/47  Pulse: 93 65  Resp: 17 16  Temp: 98.3 F (36.8 C) 97.7 F (36.5 C)  SpO2: 97% 96%   Vitals:  04/18/23 0419 04/18/23 1502 04/18/23 1940 04/19/23 0432  BP: (!) 121/49 120/66 (!) 122/57 (!) 105/47  Pulse: 73 78 93 65  Resp: 17 16 17 16   Temp: 98.2 F (36.8 C) 98.9 F (37.2 C) 98.3 F (36.8 C) 97.7 F (36.5 C)  TempSrc: Oral Oral Oral Oral  SpO2: 98% 99% 97% 96%  Weight:      Height:        General: Pt is alert, awake, not in acute distress Healing ecchymotic areas on her anterior chest wall and right neck. Cardiovascular: RRR, S1/S2 +, no rubs, no gallops Respiratory: CTA bilaterally, no wheezing, no rhonchi Abdominal: Soft, NT,  ND, bowel sounds + Left inguinal incisions intact.  Dry.  Mild erythema and skin rashes in the groin.  Nontender. Extremities: no edema, no cyanosis    The results of significant diagnostics from this hospitalization (including imaging, microbiology, ancillary and laboratory) are listed below for reference.     Microbiology: Recent Results (from the past 240 hour(s))  Resp panel by RT-PCR (RSV, Flu A&B, Covid) Anterior Nasal Swab     Status: None   Collection Time: 04/09/23 10:12 AM   Specimen: Anterior Nasal Swab  Result Value Ref Range Status   SARS Coronavirus 2 by RT PCR NEGATIVE NEGATIVE Final    Comment: (NOTE) SARS-CoV-2 target nucleic acids are NOT DETECTED.  The SARS-CoV-2 RNA is generally detectable in upper respiratory specimens during the acute phase of infection. The lowest concentration of SARS-CoV-2 viral copies this assay can detect is 138 copies/mL. A negative result does not preclude SARS-Cov-2 infection and should not be used as the sole basis for treatment or other patient management decisions. A negative result may occur with  improper specimen collection/handling, submission of specimen other than nasopharyngeal swab, presence of viral mutation(s) within the areas targeted by this assay, and inadequate number of viral copies(<138 copies/mL). A negative result must be combined with clinical observations, patient history, and epidemiological information. The expected result is Negative.  Fact Sheet for Patients:  BloggerCourse.com  Fact Sheet for Healthcare Providers:  SeriousBroker.it  This test is no t yet approved or cleared by the [place] FDA and  has been authorized for detection and/or diagnosis of SARS-CoV-2 by FDA under an Emergency Use Authorization (EUA). This EUA will remain  in effect (meaning this test can be used) for the duration of the COVID-19 declaration under Section 564(b)(1) of  the Act, 21 U.S.C.section 360bbb-3(b)(1), unless the authorization is terminated  or revoked sooner.       Influenza A by PCR NEGATIVE NEGATIVE Final   Influenza B by PCR NEGATIVE NEGATIVE Final    Comment: (NOTE) The Xpert Xpress SARS-CoV-2/FLU/RSV plus assay is intended as an aid in the diagnosis of influenza from Nasopharyngeal swab specimens and should not be used as a sole basis for treatment. Nasal washings and aspirates are unacceptable for Xpert Xpress SARS-CoV-2/FLU/RSV testing.  Fact Sheet for Patients: BloggerCourse.com  Fact Sheet for Healthcare Providers: SeriousBroker.it  This test is not yet approved or cleared by the [place] FDA and has been authorized for detection and/or diagnosis of SARS-CoV-2 by FDA under an Emergency Use Authorization (EUA). This EUA will remain in effect (meaning this test can be used) for the duration of the COVID-19 declaration under Section 564(b)(1) of the Act, 21 U.S.C. section 360bbb-3(b)(1), unless the authorization is terminated or revoked.     Resp Syncytial Virus by PCR NEGATIVE NEGATIVE Final    Comment: (NOTE) Fact Sheet for  Patients: BloggerCourse.com  Fact Sheet for Healthcare Providers: SeriousBroker.it  This test is not yet approved or cleared by the [place] FDA and has been authorized for detection and/or diagnosis of SARS-CoV-2 by FDA under an Emergency Use Authorization (EUA). This EUA will remain in effect (meaning this test can be used) for the duration of the COVID-19 declaration under Section 564(b)(1) of the Act, 21 U.S.C. section 360bbb-3(b)(1), unless the authorization is terminated or revoked.  Performed at [facility], [address] [place], [place] [address]   Blood culture (routine x 2)     Status: None   Collection Time: 04/09/23 10:20 AM   Specimen: BLOOD  Result  Value Ref Range Status   Specimen Description   Final    BLOOD SITE NOT SPECIFIED Performed at [facility], [address], [place], [place] [address]    Special Requests   Final    BOTTLES DRAWN AEROBIC AND ANAEROBIC Blood Culture adequate volume Performed at [facility], [address], [place], [place] [address]    Culture   Final    NO GROWTH 5 DAYS Performed at [facility], [address], [place], [place] [address]    Report Status 04/14/2023 FINAL  Final  Blood culture (routine x 2)     Status: None   Collection Time: 04/09/23 10:30 AM   Specimen: BLOOD  Result Value Ref Range Status   Specimen Description   Final    BLOOD SITE NOT SPECIFIED Performed at [facility], [address], [place], [place] [address]    Special Requests   Final    BOTTLES DRAWN AEROBIC AND ANAEROBIC Blood Culture adequate volume Performed at [facility], [address] [place], [place] [address]    Culture   Final    NO GROWTH 5 DAYS Performed at [facility], [address] [place], [place] [address]    Report Status 04/14/2023 FINAL  Final  Surgical pcr screen     Status: None   Collection Time: 04/11/23  9:35 PM   Specimen: Nasal Mucosa; Nasal Swab  Result Value Ref Range Status   MRSA, PCR NEGATIVE NEGATIVE Final   Staphylococcus aureus NEGATIVE NEGATIVE Final    Comment: (NOTE) The Xpert SA Assay (FDA approved for NASAL specimens in patients 59 years of age and older), is one component of a comprehensive surveillance program. It is not intended to diagnose infection nor to guide or monitor treatment. Performed at [facility], [address] [place], [place] [address]      Labs: BNP (last 3 results) Recent Labs    04/09/23 1712  BNP 73.4   Basic Metabolic Panel: Recent Labs  Lab 04/14/23 0544  NA 135  K 3.6  CL 97*  CO2 28  GLUCOSE 127*  BUN 19   CREATININE 0.54  CALCIUM 8.5*  MG 1.9  PHOS 1.4*   Liver Function Tests: Recent Labs  Lab 04/14/23 0544  AST 15  ALT 9  ALKPHOS 36*  BILITOT 0.4  PROT 5.3*  ALBUMIN 2.7*   No results for input(s): "LIPASE", "AMYLASE" in the last 168 hours. No results for input(s): "AMMONIA" in the last 168 hours. CBC: Recent Labs  Lab 04/14/23 0544  WBC 9.2  NEUTROABS 5.8  HGB 12.0  HCT 35.4*  MCV 94.9  PLT 108*   Cardiac Enzymes: No results for input(s): "CKTOTAL", "CKMB", "CKMBINDEX", "TROPONINI" in the last 168 hours.  BNP: Invalid input(s): "POCBNP" CBG: No results for input(s): "GLUCAP" in the last 168 hours. D-Dimer No results for input(s): "DDIMER" in the last 72 hours. Hgb A1c No results for input(s): "HGBA1C" in the last 72 hours. Lipid Profile No results for input(s): "CHOL", "HDL", "LDLCALC", "TRIG", "CHOLHDL", "LDLDIRECT" in the last 72 hours. Thyroid function studies No results for input(s): "TSH", "T4TOTAL", "T3FREE", "THYROIDAB" in the last 72 hours.  Invalid input(s): "FREET3" Anemia work up No results for input(s): "VITAMINB12", "FOLATE", "FERRITIN", "TIBC", "IRON", "RETICCTPCT" in the last 72 hours. Urinalysis    Component Value Date/Time   COLORURINE COLORLESS (A) 04/16/2023 0607   APPEARANCEUR CLEAR 04/16/2023 0607   LABSPEC 1.004 (L) 04/16/2023 0607   PHURINE 6.0 04/16/2023 0607   GLUCOSEU NEGATIVE 04/16/2023 0607   HGBUR NEGATIVE 04/16/2023 0607   BILIRUBINUR NEGATIVE 04/16/2023 0607   KETONESUR NEGATIVE 04/16/2023 0607   PROTEINUR NEGATIVE 04/16/2023 0607   NITRITE NEGATIVE 04/16/2023 0607   LEUKOCYTESUR NEGATIVE 04/16/2023 0607   Sepsis Labs Recent Labs  Lab 04/14/23 0544  WBC 9.2   Microbiology Recent Results (from the past 240 hour(s))  Resp panel by RT-PCR (RSV, Flu A&B, Covid) Anterior Nasal Swab     Status: None   Collection Time: 04/09/23 10:12 AM   Specimen: Anterior Nasal Swab  Result Value Ref Range Status   SARS Coronavirus 2  by RT PCR NEGATIVE NEGATIVE Final    Comment: (NOTE) SARS-CoV-2 target nucleic acids are NOT DETECTED.  The SARS-CoV-2 RNA is generally detectable in upper respiratory specimens during the acute phase of infection. The lowest concentration of SARS-CoV-2 viral copies this assay can detect is 138 copies/mL. A negative result does not preclude SARS-Cov-2 infection and should not be used as the sole basis for treatment or other patient management decisions. A negative result may occur with  improper specimen collection/handling, submission of specimen other than nasopharyngeal swab, presence of viral mutation(s) within the areas targeted by this assay, and inadequate number of viral copies(<138 copies/mL). A negative result must be combined with clinical observations, patient history, and epidemiological information. The expected result is Negative.  Fact Sheet for Patients:  BloggerCourse.com  Fact Sheet for Healthcare Providers:  SeriousBroker.it  This test is no t yet approved or cleared by the [place] FDA and  has been authorized for detection and/or diagnosis of SARS-CoV-2 by FDA under an Emergency Use Authorization (EUA). This EUA will remain  in effect (meaning this test can be used) for the duration of the COVID-19 declaration under Section 564(b)(1) of the Act, 21 U.S.C.section 360bbb-3(b)(1), unless the authorization is terminated  or revoked sooner.       Influenza A by PCR NEGATIVE NEGATIVE Final   Influenza B by PCR NEGATIVE NEGATIVE Final    Comment: (NOTE) The Xpert Xpress SARS-CoV-2/FLU/RSV plus assay is intended as an aid in the diagnosis of influenza from Nasopharyngeal swab specimens and should not be used as a sole basis for treatment. Nasal washings and aspirates are unacceptable for Xpert Xpress SARS-CoV-2/FLU/RSV testing.  Fact Sheet for Patients: BloggerCourse.com  Fact  Sheet for Healthcare Providers: SeriousBroker.it  This test is not yet approved or cleared by the [place] FDA and has been authorized for detection and/or diagnosis of SARS-CoV-2 by FDA under an Emergency Use Authorization (EUA). This EUA will remain in effect (meaning this test can be used) for the duration of the COVID-19 declaration under Section 564(b)(1) of the Act, 21 U.S.C. section 360bbb-3(b)(1), unless the authorization is terminated or revoked.  Resp Syncytial Virus by PCR NEGATIVE NEGATIVE Final    Comment: (NOTE) Fact Sheet for Patients: BloggerCourse.com  Fact Sheet for Healthcare Providers: SeriousBroker.it  This test is not yet approved or cleared by the [place] FDA and has been authorized for detection and/or diagnosis of SARS-CoV-2 by FDA under an Emergency Use Authorization (EUA). This EUA will remain in effect (meaning this test can be used) for the duration of the COVID-19 declaration under Section 564(b)(1) of the Act, 21 U.S.C. section 360bbb-3(b)(1), unless the authorization is terminated or revoked.  Performed at [facility], [address] [place], [place] [address]   Blood culture (routine x 2)     Status: None   Collection Time: 04/09/23 10:20 AM   Specimen: BLOOD  Result Value Ref Range Status   Specimen Description   Final    BLOOD SITE NOT SPECIFIED Performed at [facility], [address], [place], [place] [address]    Special Requests   Final    BOTTLES DRAWN AEROBIC AND ANAEROBIC Blood Culture adequate volume Performed at [facility], [address], [place], [place] [address]    Culture   Final    NO GROWTH 5 DAYS Performed at [facility], [address], [place], [place] [address]    Report Status 04/14/2023 FINAL  Final  Blood culture (routine x 2)     Status: None   Collection  Time: 04/09/23 10:30 AM   Specimen: BLOOD  Result Value Ref Range Status   Specimen Description   Final    BLOOD SITE NOT SPECIFIED Performed at [facility], [address] [place], [place] [address]    Special Requests   Final    BOTTLES DRAWN AEROBIC AND ANAEROBIC Blood Culture adequate volume Performed at [facility], [address] [place], [place] [address]    Culture   Final    NO GROWTH 5 DAYS Performed at [facility], [address] [place], [place] [address]    Report Status 04/14/2023 FINAL  Final  Surgical pcr screen     Status: None   Collection Time: 04/11/23  9:35 PM   Specimen: Nasal Mucosa; Nasal Swab  Result Value Ref Range Status   MRSA, PCR NEGATIVE NEGATIVE Final   Staphylococcus aureus NEGATIVE NEGATIVE Final    Comment: (NOTE) The Xpert SA Assay (FDA approved for NASAL specimens in patients 53 years of age and older), is one component of a comprehensive surveillance program. It is not intended to diagnose infection nor to guide or monitor treatment. Performed at [facility], [address] [place], [place] [address]      Time coordinating discharge:  32 minutes  SIGNED:   Dorcas Carrow, MD  [facility] 04/19/2023, 7:39 AM

## 2023-04-25 ENCOUNTER — Emergency Department (HOSPITAL_COMMUNITY): Payer: Medicare Other

## 2023-04-25 ENCOUNTER — Emergency Department (HOSPITAL_COMMUNITY)
Admission: EM | Admit: 2023-04-25 | Discharge: 2023-04-25 | Disposition: A | Discharge status: Home / Self Care | Payer: Medicare Other | Attending: Emergency Medicine | Admitting: Emergency Medicine

## 2023-04-25 DIAGNOSIS — G8918 Other acute postprocedural pain: Secondary | ICD-10-CM

## 2023-04-25 DIAGNOSIS — R509 Fever, unspecified: Secondary | ICD-10-CM | POA: Insufficient documentation

## 2023-04-25 DIAGNOSIS — R1909 Other intra-abdominal and pelvic swelling, mass and lump: Secondary | ICD-10-CM | POA: Diagnosis present

## 2023-04-25 DIAGNOSIS — R110 Nausea: Secondary | ICD-10-CM | POA: Diagnosis not present

## 2023-04-25 LAB — CBC WITH DIFFERENTIAL/PLATELET
Abs Immature Granulocytes: 0.14 10*3/uL — ABNORMAL HIGH (ref 0.00–0.07)
Basophils Absolute: 0.1 10*3/uL (ref 0.0–0.1)
Basophils Relative: 1 %
Eosinophils Absolute: 0.3 10*3/uL (ref 0.0–0.5)
Eosinophils Relative: 3 %
HCT: 38 % (ref 36.0–46.0)
Hemoglobin: 12.3 g/dL (ref 12.0–15.0)
Immature Granulocytes: 2 %
Lymphocytes Relative: 11 %
Lymphs Abs: 1 10*3/uL (ref 0.7–4.0)
MCH: 32.3 pg (ref 26.0–34.0)
MCHC: 32.4 g/dL (ref 30.0–36.0)
MCV: 99.7 fL (ref 80.0–100.0)
Monocytes Absolute: 2.1 10*3/uL — ABNORMAL HIGH (ref 0.1–1.0)
Monocytes Relative: 24 %
Neutro Abs: 5.3 10*3/uL (ref 1.7–7.7)
Neutrophils Relative %: 59 %
Platelets: 113 10*3/uL — ABNORMAL LOW (ref 150–400)
RBC: 3.81 MIL/uL — ABNORMAL LOW (ref 3.87–5.11)
RDW: 17.2 % — ABNORMAL HIGH (ref 11.5–15.5)
WBC: 8.9 10*3/uL (ref 4.0–10.5)
nRBC: 0 % (ref 0.0–0.2)

## 2023-04-25 LAB — COMPREHENSIVE METABOLIC PANEL
ALT: 27 U/L (ref 0–44)
AST: 36 U/L (ref 15–41)
Albumin: 3.1 g/dL — ABNORMAL LOW (ref 3.5–5.0)
Alkaline Phosphatase: 59 U/L (ref 38–126)
Anion gap: 12 (ref 5–15)
BUN: 22 mg/dL (ref 8–23)
CO2: 29 mmol/L (ref 22–32)
Calcium: 9.4 mg/dL (ref 8.9–10.3)
Chloride: 95 mmol/L — ABNORMAL LOW (ref 98–111)
Creatinine, Ser: 0.72 mg/dL (ref 0.44–1.00)
GFR, Estimated: >60 mL/min (ref >60)
Glucose, Bld: 101 mg/dL — ABNORMAL HIGH (ref 70–99)
Potassium: 3.2 mmol/L — ABNORMAL LOW (ref 3.5–5.1)
Sodium: 136 mmol/L (ref 135–145)
Total Bilirubin: 0.6 mg/dL (ref <1.2)
Total Protein: 6.7 g/dL (ref 6.5–8.1)

## 2023-04-25 LAB — LIPASE, BLOOD: Lipase: 52 U/L — ABNORMAL HIGH (ref 11–51)

## 2023-04-25 LAB — I-STAT CG4 LACTIC ACID, ED: Lactic Acid, Venous: 0.9 mmol/L (ref 0.5–1.9)

## 2023-04-25 MED ORDER — POTASSIUM CHLORIDE CRYS ER 20 MEQ PO TBCR
40.0000 meq | EXTENDED_RELEASE_TABLET | Freq: Once | ORAL | Status: AC
  Administered 2023-04-25: 40 meq via ORAL
  Filled 2023-04-25: qty 2

## 2023-04-25 MED ORDER — SULFAMETHOXAZOLE-TRIMETHOPRIM 800-160 MG PO TABS: 1.0000 | ORAL_TABLET | Freq: Two times a day (BID) | ORAL | 0 refills | Status: AC

## 2023-04-25 MED ORDER — SODIUM CHLORIDE 0.9 % IV BOLUS
1000.0000 mL | Freq: Once | INTRAVENOUS | Status: AC
  Administered 2023-04-25: 1000 mL via INTRAVENOUS

## 2023-04-25 MED ORDER — IOHEXOL 300 MG/ML  SOLN
100.0000 mL | Freq: Once | INTRAMUSCULAR | Status: AC | PRN
  Administered 2023-04-25: 80 mL via INTRAVENOUS

## 2023-04-25 MED ORDER — ACETAMINOPHEN 325 MG PO TABS: 650.0000 mg | ORAL_TABLET | Freq: Once | ORAL | Status: DC

## 2023-04-25 MED ORDER — SULFAMETHOXAZOLE-TRIMETHOPRIM 800-160 MG PO TABS
1.0000 | ORAL_TABLET | Freq: Once | ORAL | Status: AC
  Administered 2023-04-25: 1 via ORAL
  Filled 2023-04-25: qty 1

## 2023-04-25 NOTE — ED Provider Notes (Signed)
[place] EMERGENCY DEPARTMENT AT [facility] Provider Note   CSN: [id number] Arrival date & time: 04/25/23  1056     History  Chief Complaint  Patient presents with   Post-op Problem    Kayla Hurley is a 87 y.o. female.  88 yo F with a chief complaints of left lower quadrant mass.  The patient was just operated on for an inguinal hernia.  She is seen by her surgeon today and there is concern for hematoma versus recurrence of her hernia and was sent here for CT imaging.  Patient said that she has had some low-grade temperatures at her rehab facility.  She has had some nausea but no vomiting.  Has not had a good bowel movement in a few days.        Home Medications Prior to Admission medications   Medication Sig Start Date End Date Taking? Authorizing Provider  acetaminophen (TYLENOL) 500 MG tablet Take 2 tablets (1,000 mg total) by mouth every 6 (six) hours as needed. 04/19/23   Dorcas Carrow, MD  Calcium-Magnesium-Zinc (CAL-MAG-ZINC PO) Take 1-2 tablets by mouth daily.    [provider]  Cholecalciferol (VITAMIN D3) 125 MCG (5000 UT) capsule Take 5,000 Units by mouth daily.    [provider]  clobetasol (TEMOVATE) 0.05 % external solution Apply 1 Application topically 2 (two) times daily as needed (as directed- to affected area(s); avoid face/groin/underarms). 08/28/22   [provider]  diclofenac Sodium (VOLTAREN) 1 % GEL Apply 2 g topically daily as needed (affected sites- for pain). 11/13/17   [provider]  docusate sodium (COLACE) 100 MG capsule Take 1 capsule (100 mg total) by mouth 2 (two) times daily. 04/19/23   Dorcas Carrow, MD  fluticasone (CUTIVATE) 0.05 % cream Apply 1 Application topically 2 (two) times daily as needed (as directed- to affected area(s)). 08/29/22   [provider]  fluticasone furoate-vilanterol (BREO ELLIPTA) 200-25 MCG/ACT AEPB Inhale 1 puff into the lungs daily. 11/17/22   Luciano Cutter, MD  folic acid (FOLVITE) 1 MG tablet Take 2 mg by mouth daily.    [provider]  GAVISCON EXTRA STRENGTH 160-105 MG CHEW Chew 1 tablet by mouth every 6 (six) hours as needed (for gas).    [provider]  hydrochlorothiazide (HYDRODIURIL) 25 MG tablet Take 25 mg by mouth daily. 12/30/20   [provider]  ketoconazole (NIZORAL) 2 % cream Apply 1 Application topically daily as needed for irritation. 08/11/22   [provider]  leucovorin (WELLCOVORIN) 5 MG tablet Take 10 mg by mouth every Friday. 08/12/19   [provider]  levothyroxine (SYNTHROID) 50 MCG tablet Take 50 mcg by mouth daily before breakfast.    [provider]  methotrexate (RHEUMATREX) 2.5 MG tablet Take 4 tablets (10 mg total) by mouth every Thursday. Resume from next week 09/22/22   Glade Lloyd, MD  Multiple Vitamins-Minerals (PRESERVISION AREDS 2) CAPS Take 1 capsule by mouth in the morning and at bedtime.    [provider]  NON FORMULARY Place 1-2 tablets under the tongue See admin instructions. Hyland's leg cramp tablets- Dissolve 1-2 tablets under the tongue every four hours as needed for leg cramps    [provider]  polyethylene glycol (MIRALAX / GLYCOLAX) 17 g packet Take 17 g by mouth daily. 04/19/23   Dorcas Carrow, MD  potassium chloride (KLOR-CON) 10 MEQ tablet Take 10 mEq by mouth in the morning and at bedtime. 09/06/22  [provider]  primidone (MYSOLINE) 50 MG tablet TAKE 1/2 TABLET BY MOUTH AT BEDTIME 10/21/22   Tat, Octaviano Batty, DO  salsalate (DISALCID) 750 MG tablet Take 1,500 mg by mouth 2 (two) times daily. 09/02/19   [provider]  timolol (TIMOPTIC) 0.5 % ophthalmic solution Place 1 drop into both eyes 2 (two) times daily. 08/17/22   [provider]  VENTOLIN HFA 108 (90 Base) MCG/ACT inhaler Inhale 1 puff into the lungs every 4 (four) hours as needed for shortness of breath. 09/12/22   Glade Lloyd, MD       Allergies    Adhesive [tape], Cucumber extract, Lactose intolerance (gi), and Penicillins    Review of Systems   Review of Systems  Physical Exam Updated Vital Signs BP (!) 117/91   Pulse 85   Temp 98.9 F (37.2 C) (Oral)   Resp 17   Ht 5\' 1"  (1.549 m)   Wt 59 kg   SpO2 98%   BMI 24.58 kg/m  Physical Exam Vitals and nursing note reviewed.  Constitutional:      General: She is not in acute distress.    Appearance: She is well-developed. She is not diaphoretic.  HENT:     Head: Normocephalic and atraumatic.  Eyes:     Pupils: Pupils are equal, round, and reactive to light.  Cardiovascular:     Rate and Rhythm: Normal rate and regular rhythm.     Heart sounds: No murmur heard.    No friction rub. No gallop.  Pulmonary:     Effort: Pulmonary effort is normal.     Breath sounds: No wheezing or rales.  Abdominal:     General: There is no distension.     Palpations: Abdomen is soft.     Tenderness: There is no abdominal tenderness.     Comments: About her left inguinal surgical incision she has some erythema and a palpable mass.  Not able to easily reduce this.  Incision is clean dry and intact.  Musculoskeletal:        General: No tenderness.     Cervical back: Normal range of motion and neck supple.  Skin:    General: Skin is warm and dry.  Neurological:     Mental Status: She is alert and oriented to person, place, and time.  Psychiatric:        Behavior: Behavior normal.     ED Results / Procedures / Treatments   Labs (all labs ordered are listed, but only abnormal results are displayed) Labs Reviewed  COMPREHENSIVE METABOLIC PANEL - Abnormal; Notable for the following components:      Result Value   Potassium 3.2 (*)    Chloride 95 (*)    Glucose, Bld 101 (*)    Albumin 3.1 (*)    All other components within normal limits  LIPASE, BLOOD - Abnormal; Notable for the following components:   Lipase 52 (*)    All other components within normal limits  CBC  WITH DIFFERENTIAL/PLATELET - Abnormal; Notable for the following components:   RBC 3.81 (*)    RDW 17.2 (*)    Platelets 113 (*)    Monocytes Absolute 2.1 (*)    Abs Immature Granulocytes 0.14 (*)    All other components within normal limits  CBC WITH DIFFERENTIAL/PLATELET  I-STAT CHEM 8, ED  I-STAT CG4 LACTIC ACID, ED    EKG None  Radiology No results found.  Procedures Procedures    Medications Ordered in ED  Medications  sodium chloride 0.9 % bolus 1,000 mL (0 mLs Intravenous Stopped 04/25/23 1414)  iohexol (OMNIPAQUE) 300 MG/ML solution 100 mL (80 mLs Intravenous Contrast Given 04/25/23 1332)    ED Course/ Medical Decision Making/ A&P                                 Medical Decision Making Amount and/or Complexity of Data Reviewed Labs: ordered. Radiology: ordered.  Risk Prescription drug management.   88 yo F with a chief complaints of left lower quadrant mass after having a left inguinal hernia and left femoral hernia repair done 13 days ago.  She was seen in surgical clinic today and there is some concern for recurrent hernia versus hematoma and she was sent here for CT imaging.  Lab work without leukocytosis, no acute anemia, lactate normal, no significant electrolyte abnormalities liver function test are unremarkable.  Lipase is trivially elevated.  CT images have been obtained.  I notified general surgery who will discuss.  No formal radiology read as of yet.  3:10 PM signed out to Dr. Jeraldine Loots, please see his note for further details care in the ED.  The patients results and plan were reviewed and discussed.   Any x-rays performed were independently reviewed by myself.   Differential diagnosis were considered with the presenting HPI.  Medications  sodium chloride 0.9 % bolus 1,000 mL (0 mLs Intravenous Stopped 04/25/23 1414)  iohexol (OMNIPAQUE) 300 MG/ML solution 100 mL (80 mLs Intravenous Contrast Given 04/25/23 1332)    Vitals:   04/25/23 1125  04/25/23 1126 04/25/23 1230  BP:  129/63 (!) 117/91  Pulse:  85 85  Resp:  17 17  Temp:  98.9 F (37.2 C)   TempSrc:  Oral   SpO2:  91% 98%  Weight: 59 kg    Height: 5\' 1"  (1.549 m)      Final diagnoses:  Groin mass             Final Clinical Impression(s) / ED Diagnoses Final diagnoses:  Groin mass    Rx / DC Orders ED Discharge Orders     None         Melene Plan, DO 04/25/23 1510

## 2023-04-25 NOTE — Progress Notes (Signed)
[facility] Progress Note     Subjective: CC:  88 y/o F who presented to the ED from our office. She is s/p open repair of recurrent left inguinal hernia, repair of incarcerated, fat-containing, left femoral hernia with mesh by Dr. Corliss Skains 11/6 (POD#13). On exam she has a bulge in her left groin. Otherwise she states her pain has been controlled and she is progressing with PT at the rehab facility. She reports decent PO intake but does not like the food options at SNF. She does eat a yogurt and protein shake daily in addition to her meals. She reports constipation but has had a couple of small hard stools since surgery. Denies nausea or vomiting.   Objective: Vital signs in last 24 hours: Temp:  [98.9 F (37.2 C)-99 F (37.2 C)] 99 F (37.2 C) (11/19 1528) Pulse Rate:  [80-90] 80 (11/19 1535) Resp:  [16-17] 16 (11/19 1535) BP: (117-129)/(63-91) 124/64 (11/19 1535) SpO2:  [91 %-98 %] 94 % (11/19 1535) Weight:  [59 kg] 59 kg (11/19 1125)    Intake/Output from previous day: No intake/output data recorded. Intake/Output this shift: No intake/output data recorded.  PE: Gen:  Alert, NAD, pleasant Card:  Regular rate and rhythm Pulm:  Normal effort ORA Abd: Soft, non-tender, non-distended, left inguinal incision with steri strips mostly in tact, the area is full, a little firm, not fluctuant. There is very mild blanching erythema medially but no warmth. No drainage.  Skin: warm and dry, no rashes  Psych: A&Ox3   Lab Results:  Recent Labs    04/25/23 1305  WBC 8.9  HGB 12.3  HCT 38.0  PLT 113*   BMET Recent Labs    04/25/23 1215  NA 136  K 3.2*  CL 95*  CO2 29  GLUCOSE 101*  BUN 22  CREATININE 0.72  CALCIUM 9.4   PT/INR No results for input(s): "LABPROT", "INR" in the last 72 hours. CMP     Component Value Date/Time   NA 136 04/25/2023 1215   K 3.2 (L) 04/25/2023 1215   CL 95 (L) 04/25/2023 1215   CO2 29 04/25/2023 1215   GLUCOSE 101 (H) 04/25/2023  1215   BUN 22 04/25/2023 1215   CREATININE 0.72 04/25/2023 1215   CREATININE 0.78 03/16/2023 1034   CALCIUM 9.4 04/25/2023 1215   PROT 6.7 04/25/2023 1215   ALBUMIN 3.1 (L) 04/25/2023 1215   AST 36 04/25/2023 1215   AST 19 03/16/2023 1034   ALT 27 04/25/2023 1215   ALT 11 03/16/2023 1034   ALKPHOS 59 04/25/2023 1215   BILITOT 0.6 04/25/2023 1215   BILITOT 0.4 03/16/2023 1034   GFRNONAA >60 04/25/2023 1215   GFRNONAA >60 03/16/2023 1034   Lipase     Component Value Date/Time   LIPASE 52 (H) 04/25/2023 1215       Studies/Results: No results found.  Anti-infectives: Anti-infectives (From admission, onward)    None        Assessment/Plan 88 y/o F POD#13 from above inguinal hernia repair who presents with a bulge in her left groin.  In the ED she is hemodynamically stable, afebrile, WBC 8.9, mild hypokalemia 3.2. CT scan of the abdomen and pelvis has not been read by a radiologist, I personally reviewed it, along with Dr. Fredricka Bonine, and see no evidence of recurrent hernia. Suspect she has a post-operative hematoma. There are small dots of air within the hematoma which may be expected post-operatively or may represent early infection of the hematoma. Unless  the final read of the CT scan comes back with unexpected/acute findings, would recommend discharge back to SNF with a one week course of bactrim for possible infected hematoma. She has follow up in our office with Dr. Corliss Skains on 11/25.      LOS: 0 days   I reviewed nursing notes, ED provider notes, last 24 h vitals and pain scores, last 48 h intake and output, last 24 h labs and trends, and last 24 h imaging results.  This care required moderate level of medical decision making.   Hosie Spangle, PA-C [facility] Please see Amion for pager number during day hours 7:00am-4:30pm

## 2023-04-25 NOTE — ED Triage Notes (Signed)
Pt had inguinal hernia repair, and has swelling and discoloration on incision site

## 2023-04-25 NOTE — ED Provider Notes (Signed)
Per surgery patient appropriate for discharge with Bactrim for 1 week. Patient ready for discharge.   Gerhard Munch, MD 04/25/23 [phone]

## 2023-04-25 NOTE — Discharge Instructions (Signed)
Please be sure to follow-up with your surgery team.  You are starting a new antibiotic medicine, and this should be discussed with your primary care team.  Return here for concerning changes in your condition.

## 2023-04-25 NOTE — ED Notes (Signed)
[facility] called at  this time. 

## 2023-07-11 NOTE — Progress Notes (Signed)
 [facility] - Clinic Note  07/17/2023     CHIEF COMPLAINT Patient presents for Retina Follow Up  HISTORY OF PRESENT ILLNESS: Kayla Hurley is a 89 y.o. female who presents to the clinic today for:   HPI     Retina Follow Up   Patient presents with  Wet AMD.  In right eye.  This started 6 months ago.  I, the attending physician,  performed the HPI with the patient and updated documentation appropriately.        Comments   Patient here for 6 months retina follow up for exu ARMD OD. Patient state vision a lot worse. No eye pain. Uses shampoo for eyelids and compresses. Uses AT prn and Timolol  drops.      Last edited by Ronelle Coffee, MD on 07/17/2023  1:17 PM.     Patient feels like her vision is worse, she states things are darker and she feels like she needs stronger glasses, she was in the hospital for 25 days in November for a blockage in her intestine, she had to have sx, she uses Systane for dryness  Referring physician: No referring provider defined for this encounter.  HISTORICAL INFORMATION:   Selected notes from the MEDICAL RECORD NUMBER Referred by Dr. Reyne Cave for concern of exu ARMD OD   CURRENT MEDICATIONS: Current Outpatient Medications (Ophthalmic Drugs)  Medication Sig   timolol  (TIMOPTIC ) 0.5 % ophthalmic solution Place 1 drop into both eyes 2 (two) times daily.   No current facility-administered medications for this visit. (Ophthalmic Drugs)   Current Outpatient Medications (Other)  Medication Sig   Calcium -Magnesium -Zinc (CAL-MAG-ZINC PO) Take 1-2 tablets by mouth daily.   Cholecalciferol  (VITAMIN D3) 125 MCG (5000 UT) capsule Take 5,000 Units by mouth daily.   diclofenac  Sodium (VOLTAREN ) 1 % GEL Apply 2 g topically daily as needed (affected sites- for pain).   docusate sodium  (COLACE) 100 MG capsule Take 1 capsule (100 mg total) by mouth 2 (two) times daily.   fluticasone  furoate-vilanterol (BREO ELLIPTA ) 200-25 MCG/ACT AEPB  Inhale 1 puff into the lungs daily.   folic acid  (FOLVITE ) 1 MG tablet Take 2 mg by mouth daily.   GAVISCON EXTRA STRENGTH 160-105 MG CHEW Chew 1 tablet by mouth every 6 (six) hours as needed (for gas).   leucovorin  (WELLCOVORIN ) 5 MG tablet Take 10 mg by mouth every Friday.   levothyroxine  (SYNTHROID ) 50 MCG tablet Take 50 mcg by mouth daily before breakfast.   methotrexate  (RHEUMATREX) 2.5 MG tablet Take 4 tablets (10 mg total) by mouth every Thursday. Resume from next week   Multiple Vitamins-Minerals (PRESERVISION AREDS 2) CAPS Take 1 capsule by mouth in the morning and at bedtime.   NON FORMULARY Place 1-2 tablets under the tongue See admin instructions. Hyland's leg cramp tablets- Dissolve 1-2 tablets under the tongue every four hours as needed for leg cramps   potassium chloride  (KLOR-CON ) 10 MEQ tablet Take 10 mEq by mouth in the morning and at bedtime.   salsalate  (DISALCID) 750 MG tablet Take 1,500 mg by mouth 2 (two) times daily.   VENTOLIN  HFA 108 (90 Base) MCG/ACT inhaler Inhale 1 puff into the lungs every 4 (four) hours as needed for shortness of breath.   acetaminophen  (TYLENOL ) 500 MG tablet Take 2 tablets (1,000 mg total) by mouth every 6 (six) hours as needed. (Patient not taking: Reported on 07/17/2023)   clobetasol  (TEMOVATE ) 0.05 % external solution Apply 1 Application topically 2 (two) times daily as  needed (as directed- to affected area(s); avoid face/groin/underarms). (Patient not taking: Reported on 07/17/2023)   fluticasone  (CUTIVATE ) 0.05 % cream Apply 1 Application topically 2 (two) times daily as needed (as directed- to affected area(s)). (Patient not taking: Reported on 07/17/2023)   hydrochlorothiazide  (HYDRODIURIL ) 25 MG tablet Take 25 mg by mouth daily. (Patient not taking: Reported on 07/17/2023)   ketoconazole (NIZORAL) 2 % cream Apply 1 Application topically daily as needed for irritation. (Patient not taking: Reported on 07/17/2023)   polyethylene glycol (MIRALAX  /  GLYCOLAX ) 17 g packet Take 17 g by mouth daily. (Patient not taking: Reported on 07/17/2023)   primidone  (MYSOLINE ) 50 MG tablet TAKE 1/2 TABLET BY MOUTH AT BEDTIME (Patient not taking: Reported on 07/17/2023)   No current facility-administered medications for this visit. (Other)   REVIEW OF SYSTEMS: ROS   Positive for: Neurological, Musculoskeletal, Eyes, Respiratory Negative for: Constitutional, Gastrointestinal, Skin, Genitourinary, HENT, Endocrine, Cardiovascular, Psychiatric, Allergic/Imm, Heme/Lymph Last edited by Sylvan Evener, COA on 07/17/2023  9:28 AM.      ALLERGIES Allergies  Allergen Reactions   Adhesive [Tape] Other (See Comments)    Blisters    Cucumber Extract Other (See Comments)    Exact allergic reaction not cited   Lactose Intolerance (Gi) Diarrhea   Penicillins Diarrhea, Nausea And Vomiting and Rash   PAST MEDICAL HISTORY Past Medical History:  Diagnosis Date   COPD (chronic obstructive pulmonary disease) (HCC)    Glaucoma    Hypertensive retinopathy    Macular degeneration    Meningioma (HCC)    Rheumatoid arthritis (HCC)    Past Surgical History:  Procedure Laterality Date   APPENDECTOMY     CATARACT EXTRACTION     CESAREAN SECTION     x 3   CHOLECYSTECTOMY     CRANIECTOMY / CRANIOTOMY FOR EXCISION OF BRAIN TUMOR     meningioma   EYE SURGERY     HYSTEROSCOPY WITH D & C     INGUINAL HERNIA REPAIR     INGUINAL HERNIA REPAIR Left 04/12/2023   Procedure: OPEN REPAIR RECURRENT LEFT INGUINAL HERNIA AND OPEN REPAIR LEFT FEMORAL HERNIA WITH MESH;  Surgeon: Dareen Ebbing, MD;  Location: [facility];  Service: General;  Laterality: Left;   YAG LASER APPLICATION     FAMILY HISTORY Family History  Problem Relation Age of Onset   Other Mother        died during open heart surgery   Heart disease Father    Lung cancer Maternal Grandfather        smoker   Diabetes Son    SOCIAL HISTORY Social History   Tobacco Use   Smoking status: Former     Current packs/day: 1.00    Average packs/day: 1 pack/day for 50.0 years (50.0 ttl pk-yrs)    Types: Cigarettes   Smokeless tobacco: Never   Tobacco comments:    Quit smoking 1990's  Vaping Use   Vaping status: Never Used  Substance Use Topics   Alcohol  use: Not Currently    Comment: occasional   Drug use: Never       OPHTHALMIC EXAM:  Base Eye Exam     Visual Acuity (Snellen - Linear)       Right Left   Dist cc 20/20 20/60 -1   Dist ph cc  NI    Correction: Glasses         Tonometry (Tonopen, 9:25 AM)       Right Left   Pressure 16 17  Pupils       Dark Light Shape React APD   Right 2 1 Round Brisk None   Left 2 1 Round Brisk None         Visual Fields (Counting fingers)       Left Right    Full Full         Extraocular Movement       Right Left    Full, Ortho Full, Ortho         Neuro/Psych     Oriented x3: Yes   Mood/Affect: Normal         Dilation     Both eyes: 1.0% Mydriacyl, 2.5% Phenylephrine  @ 9:25 AM           Slit Lamp and Fundus Exam     Slit Lamp Exam       Right Left   Lids/Lashes Dermatochalasis - upper lid Dermato, mild MGD   Conjunctiva/Sclera Mild conjunctivochalasis inferiorly Mild conjunctivochalasis inferiorly   Cornea Mild arcus, 1-2+PEE, mild tear film debris 2+ inferior PEE, arcus   Anterior Chamber Deep and clear Deep and clear   Iris Round and dilated Round and dilated   Lens PCIOL in excellent position, open pc PCIOL in excellent position, open PC   Anterior Vitreous Syneresis, silicone oil micro bubbles syneresis         Fundus Exam       Right Left   Disc Pink, sharp, +cupping, central pallor, mild temporal PPA Pink and sharp, +cupping   C/D Ratio 0.7 0.6   Macula Blunted foveal reflex, drusen, RPE mottling, clumping and atrophy, +CNV with stable improvement in shallow SRF/edema, no frank heme, mild progression of GA Flat, blunted foveal reflex, Drusen, RPE mottling, clumping and  atrophy, mild progression of GA, No heme or edema, mild refractile drusen   Vessels attenuated, Tortuous attenuated, Tortuous   Periphery Attached, mild peripheral drusen, mild reticular degeneration, focal paving stone inferiorly, no heme Attached, mild peripheral drusen, mild reticular degeneration, paving stone inferiorly, No heme           Refraction     Wearing Rx       Sphere Cylinder Axis Add   Right -0.50 +1.75 010 +2.50   Left -2.50 +2.00 175 +2.50           IMAGING AND PROCEDURES  Imaging and Procedures for 07/17/2023  OCT, Retina - OU - Both Eyes       Right Eye Quality was good. Central Foveal Thickness: 294. Progression has worsened. Findings include normal foveal contour, no IRF, no SRF, retinal drusen , pigment epithelial detachment, outer retinal atrophy (Stable improvement in [facility] and edema overlying PED IT fovea, mild interval progression of GA on en face image).   Left Eye Quality was good. Central Foveal Thickness: 311. Progression has worsened. Findings include normal foveal contour, no IRF, no SRF, retinal drusen , outer retinal atrophy (Mild progression of GA on en face imaging).   Notes *Images captured and stored on drive  Diagnosis / Impression:  OD: exudative ARMD -- Stable improvement in [facility] and edema overlying PED IT fovea, mild interval progression of GA on en face image OS: nonexudative ARMD;  NFP, no IRF/SRF -- Mild progression of GA on en face imaging  Clinical management:  See below  Abbreviations: NFP - Normal foveal profile. CME - cystoid macular edema. PED - pigment epithelial detachment. IRF - intraretinal fluid. SRF - subretinal fluid. EZ - ellipsoid zone. ERM - epiretinal  membrane. ORA - outer retinal atrophy. ORT - outer retinal tubulation. SRHM - subretinal hyper-reflective material. IRHM - intraretinal hyper-reflective material            ASSESSMENT/PLAN:    ICD-10-CM   1. Exudative age-related macular degeneration of  right eye with active choroidal neovascularization (HCC)  H35.3211 OCT, Retina - OU - Both Eyes    2. Intermediate stage nonexudative age-related macular degeneration of left eye  H35.3122     3. Amblyopia of eye, left  H53.002     4. Primary open angle glaucoma (POAG) of both eyes, moderate stage  H40.1132     5. Essential hypertension  I10     6. Hypertensive retinopathy of both eyes  H35.033     7. Pseudophakia of both eyes  Z96.1     8. Dry eyes, bilateral  H04.123      1. 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 - s/p IVA OD # 1 (07.29.21), #2 (08.30.21), #3 (09.27.21), #4 (11.02.21), #5 (12.13.21), #6 (01.31.22), #7 (03.24.22), #8 (09.26.22), #9 (11.14.22), #10 (01.03.23), #11 (02.27.23), #12 (05.08.23), #13 (07.31.23)  - excellent initial response and maintenance  - BCVA OD 20/20 - improved from 20/25 - OCT shows Stable improvement in [facility] and edema overlying PED IT fovea, mild interval progression of GA on en face image at 1.5 yrs since last IVA OD  - discussed findings and treatment options   - recommend holding IVA today with follow up in 6 mos  - pt in agreement - [facility] informed consent obtained, signed and scanned 05.08.23  - f/u in 6 months, sooner prn -- DFE, OCT  2. Age related macular degeneration, non-exudative, OS - The incidence, anatomy, and pathology of dry AMD, risk of progression, and the AREDS and AREDS 2 studies including smoking risks discussed with patient.  - recommend Amsler grid monitoring  - OCT shows interval progression of GA  - BCVA OS stable at 20/60  3. Amblyopia OS  - long standing  - baseline BCVA ~20/50-60  - monitor  4. POAG OU  - was under the expert management of Dr. Reyne Cave  - on Timolol  BID OU per Dr. Reyne Cave  - IOP 16,17  5,6. Hypertensive retinopathy OU - discussed importance of tight BP control - monitor  7. Pseudophakia OU  -  s/p CE/IOL  - IOL in good position, doing well  - monitor  8. Dry eyes OU (OD>OS)  - previously managed by Dr. Reyne Cave   Ophthalmic Meds Ordered this visit:  No orders of the defined types were placed in this encounter.    Return in about 6 months (around 01/14/2024) for f/u ARMD OU, DFE, OCT.  There are no Patient Instructions on file for this visit.  This document serves as a record of services personally performed by Jeanice Millard, MD, PhD. It was created on their behalf by Morley Arabia. Bevin Bucks, OA an ophthalmic technician. The creation of this record is the provider's dictation and/or activities during the visit.    Electronically signed by: Morley Arabia. Bevin Bucks, OA 07/17/23 1:18 PM  Jeanice Millard, M.D., Ph.D. Diseases & Surgery of the Retina and Vitreous [facility]  I have reviewed the above documentation for accuracy and completeness, and I agree with the above. Jeanice Millard, M.D., Ph.D. 07/17/23 1:19 PM   Abbreviations: M myopia (nearsighted); A astigmatism;  H hyperopia (farsighted); P presbyopia; Mrx spectacle prescription;  CTL contact lenses; OD right eye; OS left eye; OU both eyes  XT exotropia; ET esotropia; PEK punctate epithelial keratitis; PEE punctate epithelial erosions; DES dry eye syndrome; MGD meibomian gland dysfunction; ATs artificial tears; PFAT's preservative free artificial tears; NSC nuclear sclerotic cataract; PSC posterior subcapsular cataract; ERM epi-retinal membrane; PVD posterior vitreous detachment; RD retinal detachment; DM diabetes mellitus; DR diabetic retinopathy; NPDR non-proliferative diabetic retinopathy; PDR proliferative diabetic retinopathy; CSME clinically significant macular edema; DME diabetic macular edema; dbh dot blot hemorrhages; CWS cotton wool spot; POAG primary open angle glaucoma; C/D cup-to-disc ratio; HVF humphrey visual field; GVF goldmann visual field; OCT optical coherence tomography; IOP intraocular pressure;  BRVO Branch retinal vein occlusion; CRVO central retinal vein occlusion; CRAO central retinal artery occlusion; BRAO branch retinal artery occlusion; RT retinal tear; SB scleral buckle; PPV pars plana vitrectomy; VH Vitreous hemorrhage; PRP panretinal laser photocoagulation; IVK intravitreal kenalog; VMT vitreomacular traction; MH Macular hole;  NVD neovascularization of the disc; NVE neovascularization elsewhere; AREDS age related eye disease study; ARMD age related macular degeneration; POAG primary open angle glaucoma; EBMD epithelial/anterior basement membrane dystrophy; ACIOL anterior chamber intraocular lens; IOL intraocular lens; PCIOL posterior chamber intraocular lens; Phaco/IOL phacoemulsification with intraocular lens placement; PRK photorefractive keratectomy; LASIK laser assisted in situ keratomileusis; HTN hypertension; DM diabetes mellitus; COPD chronic obstructive pulmonary disease

## 2023-07-17 ENCOUNTER — Ambulatory Visit (INDEPENDENT_AMBULATORY_CARE_PROVIDER_SITE_OTHER): Payer: Medicare Other | Admitting: Ophthalmology

## 2023-07-17 ENCOUNTER — Encounter (INDEPENDENT_AMBULATORY_CARE_PROVIDER_SITE_OTHER): Payer: Self-pay | Admitting: Ophthalmology

## 2023-07-17 DIAGNOSIS — H353211 Exudative age-related macular degeneration, right eye, with active choroidal neovascularization: Secondary | ICD-10-CM | POA: Diagnosis not present

## 2023-07-17 DIAGNOSIS — Z961 Presence of intraocular lens: Secondary | ICD-10-CM

## 2023-07-17 DIAGNOSIS — H401132 Primary open-angle glaucoma, bilateral, moderate stage: Secondary | ICD-10-CM

## 2023-07-17 DIAGNOSIS — I10 Essential (primary) hypertension: Secondary | ICD-10-CM

## 2023-07-17 DIAGNOSIS — H353122 Nonexudative age-related macular degeneration, left eye, intermediate dry stage: Secondary | ICD-10-CM

## 2023-07-17 DIAGNOSIS — H35033 Hypertensive retinopathy, bilateral: Secondary | ICD-10-CM | POA: Diagnosis not present

## 2023-07-17 DIAGNOSIS — H53002 Unspecified amblyopia, left eye: Secondary | ICD-10-CM

## 2023-07-17 DIAGNOSIS — H04123 Dry eye syndrome of bilateral lacrimal glands: Secondary | ICD-10-CM

## 2023-07-21 ENCOUNTER — Other Ambulatory Visit: Payer: Self-pay

## 2023-07-21 ENCOUNTER — Ambulatory Visit: Payer: Medicare Other | Attending: Adult Health | Admitting: Physical Therapy

## 2023-07-21 ENCOUNTER — Encounter: Payer: Self-pay | Admitting: Physical Therapy

## 2023-07-21 DIAGNOSIS — R293 Abnormal posture: Secondary | ICD-10-CM | POA: Insufficient documentation

## 2023-07-21 DIAGNOSIS — M6281 Muscle weakness (generalized): Secondary | ICD-10-CM | POA: Insufficient documentation

## 2023-07-21 DIAGNOSIS — R279 Unspecified lack of coordination: Secondary | ICD-10-CM | POA: Diagnosis present

## 2023-07-21 NOTE — Therapy (Signed)
OUTPATIENT PHYSICAL THERAPY FEMALE PELVIC EVALUATION   Patient Name: Kayla Hurley MRN: [id number] DOB:09-Aug-1934, 89 y.o., female Today's Date: 07/21/2023  END OF SESSION:  PT End of Session - 07/21/23 1207     Visit Number 1    Number of Visits 8    Date for PT Re-Evaluation 08/18/23    Authorization Type Medicare    PT Start Time 1101    PT Stop Time 1157    PT Time Calculation (min) 56 min    Equipment Utilized During Treatment Gait belt    Activity Tolerance Patient tolerated treatment well             Past Medical History:  Diagnosis Date   COPD (chronic obstructive pulmonary disease) (HCC)    Glaucoma    Hypertensive retinopathy    Macular degeneration    Meningioma (HCC)    Rheumatoid arthritis (HCC)    Past Surgical History:  Procedure Laterality Date   APPENDECTOMY     CATARACT EXTRACTION     CESAREAN SECTION     x 3   CHOLECYSTECTOMY     CRANIECTOMY / CRANIOTOMY FOR EXCISION OF BRAIN TUMOR     meningioma   EYE SURGERY     HYSTEROSCOPY WITH D & C     INGUINAL HERNIA REPAIR     INGUINAL HERNIA REPAIR Left 04/12/2023   Procedure: OPEN REPAIR RECURRENT LEFT INGUINAL HERNIA AND OPEN REPAIR LEFT FEMORAL HERNIA WITH MESH;  Surgeon: Manus Rudd, MD;  Location: [facility];  Service: General;  Laterality: Left;   YAG LASER APPLICATION     Patient Active Problem List   Diagnosis Date Noted   Small bowel obstruction (HCC) 04/09/2023   Left inguinal hernia 04/09/2023   HCAP (healthcare-associated pneumonia) 09/08/2022   Hypokalemia 09/08/2022   Hyponatremia 09/08/2022   Severe sepsis (HCC) 09/07/2022   COVID 09/07/2022   COVID-19 virus infection 09/07/2022   OA (osteoarthritis) of hip 09/07/2022   Thrombocytopenia (HCC) 09/06/2022   Fall 09/06/2022   COVID-19 09/06/2022   Chronic rhinitis 06/02/2022   CAP (community acquired pneumonia) 04/19/2022   Viral upper respiratory tract infection 03/31/2022   Impaired cognition 12/23/2021   Seborrheic  dermatitis 12/23/2021   Urge incontinence of urine 12/23/2021   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   Chronic cough 08/26/2021   Psoriasis of scalp 05/14/2021   Essential tremor 11/19/2020   Osteoporosis 07/14/2020   Anxiety disorder 02/18/2020   Hypertensive retinopathy 01/07/2020   Primary open angle glaucoma 01/02/2020   Urinary tract infectious disease 12/10/2019   Immunosuppression (HCC) 10/24/2019   Swelling of limb 10/24/2019   Panlobular emphysema (HCC) 04/11/2018   Rheumatoid arthritis (HCC) 04/10/2018   Hypothyroidism 04/10/2018   Dyslipidemia 04/10/2018   Essential hypertension 04/10/2018    PCP: [facility]   REFERRING PROVIDER: Judd Gaudier, NP   REFERRING DIAG: N39.41 (ICD-10-CM) - Urge incontinence  THERAPY DIAG:  Muscle weakness (generalized)  Unspecified lack of coordination  Abnormal posture  Rationale for Evaluation and Treatment: Rehabilitation  ONSET DATE: unknown   SUBJECTIVE:  SUBJECTIVE STATEMENT: "YOU-nuh"  Patient reports to PT with urinary urgency and leakage. If she is in the middle of doing something, she is unable to make it to the bathroom when urgency gets strong. Patient has a dog at home.  Fluid intake: water - 3 bottles (16 oz) plain, hydration supplement (Native)  PAIN:  Are you having pain? No NPRS scale: 0/10  PRECAUTIONS: None  RED FLAGS: None   WEIGHT BEARING RESTRICTIONS: No  FALLS:  Has patient fallen in last 6 months? No  OCCUPATION: retired   ACTIVITY LEVEL : Goes to [facility] physical therapy 1x/wk, using weights and performing cardio   PLOF: Independent  PATIENT GOALS: decrease urinary leakage   PERTINENT HISTORY:  COPD, RA, appendectomy, C-sectionx3, inguinal hernia repair 04/12/23 Sexual abuse: No  BOWEL  MOVEMENT: Pain with bowel movement: Yes Type of bowel movement:Type (Bristol Stool Scale) 3, Frequency daily, Strain no, and Splinting no Fully empty rectum: Yes:   Leakage: No Pads: No Fiber supplement/laxative No  URINATION: Pain with urination: No Fully empty bladder: Yes: has to go shortly after voiding again  Stream: Strong Urgency: Yes  Frequency: goes "a lot" more than normal - 3-4x/night  Leakage: Urge to void Pads: Yes: unsure how many she uses   INTERCOURSE:  Ability to have vaginal penetration No  Not currently sexually active   PREGNANCY: C-section deliveries 3 Currently pregnant No  PROLAPSE: Pressure - feels like heaviness in the pelvic floor (feels like "everything has dropped")    OBJECTIVE:  Note: Objective measures were completed at Evaluation unless otherwise noted.  PATIENT SURVEYS:  PFIQ-7: 68  COGNITION: Overall cognitive status: Within functional limits for tasks assessed    SENSATION: Light touch: Appears intact  LUMBAR SPECIAL TESTS:  Single leg stance test: Positive  FUNCTIONAL TESTS:  Squat: bilateral dynamic knee valgus with weightbearing   GAIT: Assistive device utilized: Single point cane Comments: moderate trendelenburg gait pattern with ambulation   POSTURE: rounded shoulders, forward head, increased thoracic kyphosis, and flexed trunk    LUMBARAROM/PROM:  A/PROM A/PROM  eval  Flexion 25% limited  Extension 25% limited  Right lateral flexion 50% limited  Left lateral flexion 50% limited  Right rotation 25% limited  Left rotation 25%limited   (Blank rows = not tested)  LOWER EXTREMITY ROM: WFL  Active ROM Right eval Left eval  Hip flexion    Hip extension    Hip abduction    Hip adduction    Hip internal rotation    Hip external rotation    Knee flexion    Knee extension    Ankle dorsiflexion    Ankle plantarflexion    Ankle inversion    Ankle eversion     (Blank rows = not tested)  LOWER EXTREMITY  MMT: 4-/5 bilateral knees and hips grossly   MMT Right eval Left eval  Hip flexion    Hip extension    Hip abduction    Hip adduction    Hip internal rotation    Hip external rotation    Knee flexion    Knee extension    Ankle dorsiflexion    Ankle plantarflexion    Ankle inversion    Ankle eversion     (Blank rows = not tested) PALPATION:   General: upper chest breathing  Pelvic Alignment: WNL  Abdominal: abdominal scars: appendectomy, inguinal hernia repair, C-sectionx3, no TTP                 External Perineal Exam: general  dryness noted, perineal dropping with cough test                              Internal Pelvic Floor: decreased muscle tone throughout and lack of coordination between diaphragm and pelvic floor - patient is unable to actively lengthen her pelvic floor   Patient confirms identification and approves PT to assess internal pelvic floor and treatment Yes No emotional/communication barriers or cognitive limitation. Patient is motivated to learn. Patient understands and agrees with treatment goals and plan. PT explains patient will be examined in standing, sitting, and lying down to see how their muscles and joints work. When they are ready, they will be asked to remove their underwear so PT can examine their perineum. The patient is also given the option of providing their own chaperone as one is not provided in our facility. The patient also has the right and is explained the right to defer or refuse any part of the evaluation or treatment including the internal exam. With the patient's consent, PT will use one gloved finger to gently assess the muscles of the pelvic floor, seeing how well it contracts and relaxes and if there is muscle symmetry. After, the patient will get dressed and PT and patient will discuss exam findings and plan of care. PT and patient discuss plan of care, schedule, attendance policy and HEP activities.  PELVIC MMT:   MMT eval  Vaginal  3/5, 4 quick flicks, 2 second hold  Internal Anal Sphincter   External Anal Sphincter   Puborectalis   Diastasis Recti   (Blank rows = not tested)       TONE: Generalized weakness noted throughout bilateral pelvic floor musculature  PROLAPSE: N/A  TODAY'S TREATMENT:                                                                                                                              DATE:   EVAL 07/21/23: Examination completed, findings reviewed, pt educated on POC, HEP, and self care. Pt motivated to participate in PT and agreeable to attempt recommendations.  Neuro re-ed: Hooklying diaphragmatic breathing + pelvic floor lengthening during inhalation and shortening during exhalation 2x10 breaths  Hooklying pelvic floor quick contractions 2x10  Therapeutic activity: pelvic floor anatomy, pelvic floor connection to diaphragm, pelvic floor active range of motion  PATIENT EDUCATION:  Education details: pelvic floor anatomy, pelvic floor connection to diaphragm, pelvic floor active range of motion Person educated: Patient Education method: Explanation, Demonstration, Tactile cues, Verbal cues, and Handouts Education comprehension: verbalized understanding, returned demonstration, verbal cues required, and tactile cues required  HOME EXERCISE PROGRAM: Access Code: [facility] URL: [email or web address] Date: 07/21/2023 Prepared by: Earna Coder  Exercises - Supine Pelvic Floor Contraction  - 1 x daily - 7 x weekly - 2 sets - 10 reps - Quick Flick Pelvic Floor Contractions in Hooklying  - 1 x daily - 7 x  weekly - 2 sets - 10 reps  ASSESSMENT:  CLINICAL IMPRESSION: Patient is a 89 y.o. female who was seen today for physical therapy evaluation and treatment for urge incontinence. She is not experiencing any pelvic pain at this time. Patient is unable to make it to the bathroom on time when her urinary urgency gets high, and she wears diapers in public for this. Patient  demonstrates weakness and decreased lumbopelvic mobility. Internal examination revealed weakness in the pelvic floor and lack of coordination with the diaphragm. With cueing, pt was able to actively lengthen and shorten her pelvic floor with breathing in supine. Slight increase in urgency following internal examination. Pt would benefit from additional PT to further address deficits.   OBJECTIVE IMPAIRMENTS: Abnormal gait, decreased activity tolerance, decreased coordination, decreased endurance, decreased mobility, difficulty walking, decreased ROM, and decreased strength.   ACTIVITY LIMITATIONS: continence  PARTICIPATION LIMITATIONS:  N/A  PERSONAL FACTORS: Age and Past/current experiences are also affecting patient's functional outcome.   REHAB POTENTIAL: Good  CLINICAL DECISION MAKING: Stable/uncomplicated  EVALUATION COMPLEXITY: Low   GOALS: Goals reviewed with patient? Yes  SHORT TERM GOALS: Target date: 08/18/2023  Pt will be independent with HEP.  Baseline: Goal status: INITIAL  2.  Pt will be able to teach back and utilize urge suppression technique in order to help reduce number of trips to the bathroom.    Baseline:  Goal status: INITIAL  3.  Pt will have 50% less urgency due to bladder retraining and strengthening to improve quality of life and urge urinary incontinence. Baseline:  Goal status: INITIAL  LONG TERM GOALS: Target date: 01/18/2024  Pt will be independent with advanced HEP.  Baseline:  Goal status: INITIAL  2.  Pt will decrease frequency of nightly trips to the bathroom to 2 or less in order to get restful sleep.   Baseline: 3-4x Goal status: INITIAL  3.  Pt to demonstrate improved coordination of pelvic floor and breathing mechanics with 10# squat with appropriate synergistic patterns to decrease pain and leakage at least 75% of the time.   Baseline:  Goal status: INITIAL  4.  Pt will demonstrate normal pelvic floor muscle tone and A/ROM, able to  achieve 4/5 strength with contractions and 10 sec endurance, in order to provide appropriate lumbopelvic support in functional activities.   Baseline:  Goal status: INITIAL  PLAN:  PT FREQUENCY: 1x/week  PT DURATION: 8 weeks  PLANNED INTERVENTIONS: 97110-Therapeutic exercises, 97530- Therapeutic activity, 97112- Neuromuscular re-education, 97535- Self Care, [id number]- Manual therapy, [phone]- Aquatic Therapy, 97035- Ultrasound, Taping, Dry Needling, Joint mobilization, Spinal mobilization, Scar mobilization, Cryotherapy, and Moist heat  PLAN FOR NEXT SESSION: introduce urge drill, introduce seated pelvic floor AROM with breathing, introduce functional strength training with pelvic floor control  Omar Person, PT 07/21/2023, 12:08 PM

## 2023-07-26 ENCOUNTER — Ambulatory Visit: Payer: Medicare Other | Admitting: Physical Therapy

## 2023-08-04 ENCOUNTER — Encounter: Payer: Medicare Other | Admitting: Physical Therapy

## 2023-08-09 ENCOUNTER — Ambulatory Visit: Payer: Medicare Other | Admitting: Physical Therapy

## 2023-08-17 ENCOUNTER — Ambulatory Visit: Payer: Medicare Other | Admitting: Physical Therapy

## 2023-08-23 ENCOUNTER — Ambulatory Visit: Payer: Medicare Other | Attending: Adult Health | Admitting: Physical Therapy

## 2023-08-23 DIAGNOSIS — R279 Unspecified lack of coordination: Secondary | ICD-10-CM | POA: Insufficient documentation

## 2023-08-23 DIAGNOSIS — M6281 Muscle weakness (generalized): Secondary | ICD-10-CM | POA: Diagnosis present

## 2023-08-23 DIAGNOSIS — R293 Abnormal posture: Secondary | ICD-10-CM | POA: Diagnosis present

## 2023-08-23 NOTE — Patient Instructions (Signed)

## 2023-08-23 NOTE — Therapy (Signed)
 OUTPATIENT PHYSICAL THERAPY FEMALE PELVIC TREATMENT   Patient Name: Kayla Hurley MRN: [id number] DOB:[birth year], 89 y.o., female Today's Date: 08/23/2023  END OF SESSION:  PT End of Session - 08/23/23 1231     Visit Number 2    Number of Visits 8    Date for PT Re-Evaluation 08/18/23    Authorization Type Medicare    PT Start Time 1145    PT Stop Time 1230    PT Time Calculation (min) 45 min    Equipment Utilized During Treatment Gait belt    Activity Tolerance Patient tolerated treatment well              Past Medical History:  Diagnosis Date   COPD (chronic obstructive pulmonary disease) (HCC)    Glaucoma    Hypertensive retinopathy    Macular degeneration    Meningioma (HCC)    Rheumatoid arthritis (HCC)    Past Surgical History:  Procedure Laterality Date   APPENDECTOMY     CATARACT EXTRACTION     CESAREAN SECTION     x 3   CHOLECYSTECTOMY     CRANIECTOMY / CRANIOTOMY FOR EXCISION OF BRAIN TUMOR     meningioma   EYE SURGERY     HYSTEROSCOPY WITH D & C     INGUINAL HERNIA REPAIR     INGUINAL HERNIA REPAIR Left 04/12/2023   Procedure: OPEN REPAIR RECURRENT LEFT INGUINAL HERNIA AND OPEN REPAIR LEFT FEMORAL HERNIA WITH MESH;  Surgeon: Manus Rudd, MD;  Location: [facility];  Service: General;  Laterality: Left;   YAG LASER APPLICATION     Patient Active Problem List   Diagnosis Date Noted   Small bowel obstruction (HCC) 04/09/2023   Left inguinal hernia 04/09/2023   HCAP (healthcare-associated pneumonia) 09/08/2022   Hypokalemia 09/08/2022   Hyponatremia 09/08/2022   Severe sepsis (HCC) 09/07/2022   COVID 09/07/2022   COVID-19 virus infection 09/07/2022   OA (osteoarthritis) of hip 09/07/2022   Thrombocytopenia (HCC) 09/06/2022   Fall 09/06/2022   COVID-19 09/06/2022   Chronic rhinitis 06/02/2022   CAP (community acquired pneumonia) 04/19/2022   Viral upper respiratory tract infection 03/31/2022   Impaired cognition 12/23/2021   Seborrheic  dermatitis 12/23/2021   Urge incontinence of urine 12/23/2021   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   Chronic cough 08/26/2021   Psoriasis of scalp 05/14/2021   Essential tremor 11/19/2020   Osteoporosis 07/14/2020   Anxiety disorder 02/18/2020   Hypertensive retinopathy 01/07/2020   Primary open angle glaucoma 01/02/2020   Urinary tract infectious disease 12/10/2019   Immunosuppression (HCC) 10/24/2019   Swelling of limb 10/24/2019   Panlobular emphysema (HCC) 04/11/2018   Rheumatoid arthritis (HCC) 04/10/2018   Hypothyroidism 04/10/2018   Dyslipidemia 04/10/2018   Essential hypertension 04/10/2018    PCP: [facility]   REFERRING PROVIDER: Judd Gaudier, NP   REFERRING DIAG: N39.41 (ICD-10-CM) - Urge incontinence  THERAPY DIAG:  Muscle weakness (generalized)  Unspecified lack of coordination  Abnormal posture  Rationale for Evaluation and Treatment: Rehabilitation  ONSET DATE: unknown   SUBJECTIVE:  SUBJECTIVE STATEMENT: "YOU-nuh"  Patient reports that she went to neuro PT yesterday for vertigo - it helped, allowing her to drive to PT today. When she doesn't wear her paper panties, she can go longer in between voids.  Fluid intake: water - 3 bottles (16 oz) plain, hydration supplement (Native)  PAIN:  Are you having pain? No NPRS scale: 0/10  PRECAUTIONS: None  RED FLAGS: None   WEIGHT BEARING RESTRICTIONS: No  FALLS:  Has patient fallen in last 6 months? No  OCCUPATION: retired   ACTIVITY LEVEL : Goes to [facility] physical therapy 1x/wk, using weights and performing cardio   PLOF: Independent  PATIENT GOALS: decrease urinary leakage   PERTINENT HISTORY:  COPD, RA, appendectomy, C-sectionx3, inguinal hernia repair 04/12/23 Sexual abuse: No  BOWEL  MOVEMENT: Pain with bowel movement: Yes Type of bowel movement:Type (Bristol Stool Scale) 3, Frequency daily, Strain no, and Splinting no Fully empty rectum: Yes:   Leakage: No Pads: No Fiber supplement/laxative No  URINATION: Pain with urination: No Fully empty bladder: Yes: has to go shortly after voiding again  Stream: Strong Urgency: Yes  Frequency: goes "a lot" more than normal - 3-4x/night  Leakage: Urge to void Pads: Yes: unsure how many she uses   INTERCOURSE:  Ability to have vaginal penetration No  Not currently sexually active   PREGNANCY: C-section deliveries 3 Currently pregnant No  PROLAPSE: Pressure - feels like heaviness in the pelvic floor (feels like "everything has dropped")    OBJECTIVE:  Note: Objective measures were completed at Evaluation unless otherwise noted.  PATIENT SURVEYS:  PFIQ-7: 51  COGNITION: Overall cognitive status: Within functional limits for tasks assessed    SENSATION: Light touch: Appears intact  LUMBAR SPECIAL TESTS:  Single leg stance test: Positive  FUNCTIONAL TESTS:  Squat: bilateral dynamic knee valgus with weightbearing   GAIT: Assistive device utilized: Single point cane Comments: moderate trendelenburg gait pattern with ambulation   POSTURE: rounded shoulders, forward head, increased thoracic kyphosis, and flexed trunk    LUMBARAROM/PROM:  A/PROM A/PROM  eval  Flexion 25% limited  Extension 25% limited  Right lateral flexion 50% limited  Left lateral flexion 50% limited  Right rotation 25% limited  Left rotation 25%limited   (Blank rows = not tested)  LOWER EXTREMITY ROM: WFL  Active ROM Right eval Left eval  Hip flexion    Hip extension    Hip abduction    Hip adduction    Hip internal rotation    Hip external rotation    Knee flexion    Knee extension    Ankle dorsiflexion    Ankle plantarflexion    Ankle inversion    Ankle eversion     (Blank rows = not tested)  LOWER EXTREMITY  MMT: 4-/5 bilateral knees and hips grossly   MMT Right eval Left eval  Hip flexion    Hip extension    Hip abduction    Hip adduction    Hip internal rotation    Hip external rotation    Knee flexion    Knee extension    Ankle dorsiflexion    Ankle plantarflexion    Ankle inversion    Ankle eversion     (Blank rows = not tested) PALPATION:   General: upper chest breathing  Pelvic Alignment: WNL  Abdominal: abdominal scars: appendectomy, inguinal hernia repair, C-sectionx3, no TTP                 External Perineal Exam: general dryness noted,  perineal dropping with cough test                              Internal Pelvic Floor: decreased muscle tone throughout and lack of coordination between diaphragm and pelvic floor - patient is unable to actively lengthen her pelvic floor   Patient confirms identification and approves PT to assess internal pelvic floor and treatment Yes No emotional/communication barriers or cognitive limitation. Patient is motivated to learn. Patient understands and agrees with treatment goals and plan. PT explains patient will be examined in standing, sitting, and lying down to see how their muscles and joints work. When they are ready, they will be asked to remove their underwear so PT can examine their perineum. The patient is also given the option of providing their own chaperone as one is not provided in our facility. The patient also has the right and is explained the right to defer or refuse any part of the evaluation or treatment including the internal exam. With the patient's consent, PT will use one gloved finger to gently assess the muscles of the pelvic floor, seeing how well it contracts and relaxes and if there is muscle symmetry. After, the patient will get dressed and PT and patient will discuss exam findings and plan of care. PT and patient discuss plan of care, schedule, attendance policy and HEP activities.  PELVIC MMT:   MMT eval  Vaginal  3/5, 4 quick flicks, 2 second hold  Internal Anal Sphincter   External Anal Sphincter   Puborectalis   Diastasis Recti   (Blank rows = not tested)       TONE: Generalized weakness noted throughout bilateral pelvic floor musculature  PROLAPSE: N/A  TODAY'S TREATMENT:                                                                                                                              DATE:   EVAL 07/21/23: Examination completed, findings reviewed, pt educated on POC, HEP, and self care. Pt motivated to participate in PT and agreeable to attempt recommendations.  Neuro re-ed: Hooklying diaphragmatic breathing + pelvic floor lengthening during inhalation and shortening during exhalation 2x10 breaths  Hooklying pelvic floor quick contractions 2x10  Therapeutic activity: pelvic floor anatomy, pelvic floor connection to diaphragm, pelvic floor active range of motion  07/21/23:   Neuro re-ed: SEATED diaphragmatic breathing + pelvic floor lengthening during inhalation and shortening during exhalation 2x10 breaths  Sit to stand + diaphragmatic breathing 2x10  Bridge + ball squeeze + diaphragmatic breathing 2x10  Therapeutic activity: pelvic floor anatomy, pelvic floor connection to diaphragm, pelvic floor active range of motion Urge drill for urge incontinence management   PATIENT EDUCATION:  Education details: pelvic floor anatomy, pelvic floor connection to diaphragm, pelvic floor active range of motion Person educated: Patient Education method: Explanation, Demonstration, Tactile cues, Verbal cues, and Handouts Education comprehension: verbalized understanding, returned demonstration,  verbal cues required, and tactile cues required  HOME EXERCISE PROGRAM: Access Code: [email or web address] URL: [email or web address] Date: 08/23/2023 Prepared by: Earna Coder  Exercises - Seated Pelvic Floor Contraction  - 1 x daily - 7 x weekly - 2 sets - 10 reps - Sit to Stand with  Armchair  - 1 x daily - 7 x weekly - 2 sets - 8 reps - Supine Bridge with Mini Swiss Ball Between Knees  - 1 x daily - 7 x weekly - 2 sets - 10 reps  ASSESSMENT:  CLINICAL IMPRESSION: Patient is a 89 y.o. female who was seen today for physical therapy treatment for urge incontinence. She is not experiencing any pelvic pain at this time. Patient is unable to make it to the bathroom on time when her urinary urgency gets high, and she wears diapers in public for this. She finds that her urgency increases when she has a pad on vs when she doesn't. Pt did very well with all exercise progressions and reports thorough understanding of urge drill for urge urinary incontinence. Pt would benefit from additional PT to further address deficits.   OBJECTIVE IMPAIRMENTS: Abnormal gait, decreased activity tolerance, decreased coordination, decreased endurance, decreased mobility, difficulty walking, decreased ROM, and decreased strength.   ACTIVITY LIMITATIONS: continence  PARTICIPATION LIMITATIONS:  N/A  PERSONAL FACTORS: Age and Past/current experiences are also affecting patient's functional outcome.   REHAB POTENTIAL: Good  CLINICAL DECISION MAKING: Stable/uncomplicated  EVALUATION COMPLEXITY: Low   GOALS: Goals reviewed with patient? Yes  SHORT TERM GOALS: Target date: 08/18/2023  Pt will be independent with HEP.  Baseline: Goal status: INITIAL  2.  Pt will be able to teach back and utilize urge suppression technique in order to help reduce number of trips to the bathroom.    Baseline:  Goal status: INITIAL  3.  Pt will have 50% less urgency due to bladder retraining and strengthening to improve quality of life and urge urinary incontinence. Baseline:  Goal status: INITIAL  LONG TERM GOALS: Target date: 01/18/2024  Pt will be independent with advanced HEP.  Baseline:  Goal status: INITIAL  2.  Pt will decrease frequency of nightly trips to the bathroom to 2 or less in order to get  restful sleep.   Baseline: 3-4x Goal status: INITIAL  3.  Pt to demonstrate improved coordination of pelvic floor and breathing mechanics with 10# squat with appropriate synergistic patterns to decrease pain and leakage at least 75% of the time.   Baseline:  Goal status: INITIAL  4.  Pt will demonstrate normal pelvic floor muscle tone and A/ROM, able to achieve 4/5 strength with contractions and 10 sec endurance, in order to provide appropriate lumbopelvic support in functional activities.   Baseline:  Goal status: INITIAL  PLAN:  PT FREQUENCY: 1x/week  PT DURATION: 8 weeks  PLANNED INTERVENTIONS: 97110-Therapeutic exercises, 97530- Therapeutic activity, 97112- Neuromuscular re-education, 97535- Self Care, [id number]- Manual therapy, [id number]- Aquatic Therapy, 97035- Ultrasound, Taping, Dry Needling, Joint mobilization, Spinal mobilization, Scar mobilization, Cryotherapy, and Moist heat  PLAN FOR NEXT SESSION: introduce urge drill, introduce seated pelvic floor AROM with breathing, introduce functional strength training with pelvic floor control  Omar Person, PT 08/23/2023, 12:31 PM

## 2023-08-30 ENCOUNTER — Encounter: Payer: Medicare Other | Admitting: Physical Therapy

## 2023-09-06 ENCOUNTER — Ambulatory Visit: Payer: Medicare Other | Admitting: Physical Therapy

## 2023-09-13 ENCOUNTER — Ambulatory Visit: Payer: Medicare Other | Admitting: Physical Therapy

## 2023-09-18 ENCOUNTER — Encounter (INDEPENDENT_AMBULATORY_CARE_PROVIDER_SITE_OTHER): Payer: Self-pay

## 2023-09-18 ENCOUNTER — Encounter (INDEPENDENT_AMBULATORY_CARE_PROVIDER_SITE_OTHER): Payer: Medicare Other | Admitting: Ophthalmology

## 2023-10-05 ENCOUNTER — Ambulatory Visit: Admitting: Physical Therapy

## 2023-10-19 ENCOUNTER — Ambulatory Visit: Attending: Adult Health | Admitting: Physical Therapy

## 2023-10-26 ENCOUNTER — Ambulatory Visit: Admitting: Physical Therapy

## 2024-01-01 NOTE — Progress Notes (Signed)
 [facility] - Clinic Note  01/15/2024     CHIEF COMPLAINT Patient presents for Retina Follow Up  HISTORY OF PRESENT ILLNESS: Kayla Hurley is a 89 y.o. female who presents to the clinic today for:   HPI     Retina Follow Up   Patient presents with  Wet AMD.  In both eyes.  This started 6 months ago.  Duration of 6 months.  Since onset it is stable.  I, the attending physician,  performed the HPI with the patient and updated documentation appropriately.        Comments   6 month retina follow up ARMD OU pt is reporting no vision changes noticed she denies any flashes or floaters pt is reporting a headache for the past 3 days behind OS       Last edited by Valdemar Rogue, MD on 01/18/2024  1:22 AM.    Patient states she has new specs, doesn't like them and has another pair she doesn't see out of well. Has them made at [employer]. Taking AREDS twice a day. Some pain in OU, uses Systane.   Referring physician: No referring provider defined for this encounter.  HISTORICAL INFORMATION:   Selected notes from the MEDICAL RECORD NUMBER Referred by Dr. Lelon for concern of exu ARMD OD   CURRENT MEDICATIONS: Current Outpatient Medications (Ophthalmic Drugs)  Medication Sig   timolol  (TIMOPTIC ) 0.5 % ophthalmic solution Place 1 drop into both eyes 2 (two) times daily.   No current facility-administered medications for this visit. (Ophthalmic Drugs)   Current Outpatient Medications (Other)  Medication Sig   acetaminophen  (TYLENOL ) 500 MG tablet Take 2 tablets (1,000 mg total) by mouth every 6 (six) hours as needed. (Patient not taking: Reported on 07/17/2023)   Calcium -Magnesium -Zinc (CAL-MAG-ZINC PO) Take 1-2 tablets by mouth daily.   Cholecalciferol  (VITAMIN D3) 125 MCG (5000 UT) capsule Take 5,000 Units by mouth daily.   clobetasol  (TEMOVATE ) 0.05 % external solution Apply 1 Application topically 2 (two) times daily as needed (as directed- to affected area(s);  avoid face/groin/underarms). (Patient not taking: Reported on 07/17/2023)   diclofenac  Sodium (VOLTAREN ) 1 % GEL Apply 2 g topically daily as needed (affected sites- for pain).   docusate sodium  (COLACE) 100 MG capsule Take 1 capsule (100 mg total) by mouth 2 (two) times daily.   fluticasone  (CUTIVATE ) 0.05 % cream Apply 1 Application topically 2 (two) times daily as needed (as directed- to affected area(s)). (Patient not taking: Reported on 07/17/2023)   fluticasone  furoate-vilanterol (BREO ELLIPTA ) 200-25 MCG/ACT AEPB Inhale 1 puff into the lungs daily.   folic acid  (FOLVITE ) 1 MG tablet Take 2 mg by mouth daily.   GAVISCON EXTRA STRENGTH 160-105 MG CHEW Chew 1 tablet by mouth every 6 (six) hours as needed (for gas).   hydrochlorothiazide  (HYDRODIURIL ) 25 MG tablet Take 25 mg by mouth daily. (Patient not taking: Reported on 07/17/2023)   ketoconazole (NIZORAL) 2 % cream Apply 1 Application topically daily as needed for irritation. (Patient not taking: Reported on 07/17/2023)   leucovorin  (WELLCOVORIN ) 5 MG tablet Take 10 mg by mouth every Friday.   levothyroxine  (SYNTHROID ) 50 MCG tablet Take 50 mcg by mouth daily before breakfast.   methotrexate  (RHEUMATREX) 2.5 MG tablet Take 4 tablets (10 mg total) by mouth every Thursday. Resume from next week   Multiple Vitamins-Minerals (PRESERVISION AREDS 2) CAPS Take 1 capsule by mouth in the morning and at bedtime.   NON FORMULARY Place 1-2 tablets under  the tongue See admin instructions. Hyland's leg cramp tablets- Dissolve 1-2 tablets under the tongue every four hours as needed for leg cramps   polyethylene glycol (MIRALAX  / GLYCOLAX ) 17 g packet Take 17 g by mouth daily. (Patient not taking: Reported on 07/17/2023)   potassium chloride  (KLOR-CON ) 10 MEQ tablet Take 10 mEq by mouth in the morning and at bedtime.   primidone  (MYSOLINE ) 50 MG tablet TAKE 1/2 TABLET BY MOUTH AT BEDTIME (Patient not taking: Reported on 07/17/2023)   salsalate  (DISALCID) 750 MG  tablet Take 1,500 mg by mouth 2 (two) times daily.   VENTOLIN  HFA 108 (90 Base) MCG/ACT inhaler Inhale 1 puff into the lungs every 4 (four) hours as needed for shortness of breath.   No current facility-administered medications for this visit. (Other)   REVIEW OF SYSTEMS: ROS   Positive for: Neurological, Musculoskeletal, Eyes, Respiratory Negative for: Constitutional, Gastrointestinal, Skin, Genitourinary, HENT, Endocrine, Cardiovascular, Psychiatric, Allergic/Imm, Heme/Lymph Last edited by Resa Delon ORN, COT on 01/15/2024  9:25 AM.       ALLERGIES Allergies  Allergen Reactions   Adhesive [Tape] Other (See Comments)    Blisters    Cucumber Extract Other (See Comments)    Exact allergic reaction not cited   Lactose Intolerance (Gi) Diarrhea   Penicillins Diarrhea, Nausea And Vomiting and Rash   PAST MEDICAL HISTORY Past Medical History:  Diagnosis Date   COPD (chronic obstructive pulmonary disease) (HCC)    Glaucoma    Hypertensive retinopathy    Macular degeneration    Meningioma (HCC)    Rheumatoid arthritis (HCC)    Past Surgical History:  Procedure Laterality Date   APPENDECTOMY     CATARACT EXTRACTION     CESAREAN SECTION     x 3   CHOLECYSTECTOMY     CRANIECTOMY / CRANIOTOMY FOR EXCISION OF BRAIN TUMOR     meningioma   EYE SURGERY     HYSTEROSCOPY WITH D & C     INGUINAL HERNIA REPAIR     INGUINAL HERNIA REPAIR Left 04/12/2023   Procedure: OPEN REPAIR RECURRENT LEFT INGUINAL HERNIA AND OPEN REPAIR LEFT FEMORAL HERNIA WITH MESH;  Surgeon: Belinda Cough, MD;  Location: [facility];  Service: General;  Laterality: Left;   YAG LASER APPLICATION     FAMILY HISTORY Family History  Problem Relation Age of Onset   Other Mother        died during open heart surgery   Heart disease Father    Lung cancer Maternal Grandfather        smoker   Diabetes Son    SOCIAL HISTORY Social History   Tobacco Use   Smoking status: Former    Current packs/day: 1.00     Average packs/day: 1 pack/day for 50.0 years (50.0 ttl pk-yrs)    Types: Cigarettes   Smokeless tobacco: Never   Tobacco comments:    Quit smoking 1990's  Vaping Use   Vaping status: Never Used  Substance Use Topics   Alcohol  use: Not Currently    Comment: occasional   Drug use: Never       OPHTHALMIC EXAM:  Base Eye Exam     Visual Acuity (Snellen - Linear)       Right Left   Dist cc 20/20 20/60 -2         Tonometry (Tonopen, 9:29 AM)       Right Left   Pressure 16 18         Pupils  Pupils Dark Light Shape React APD   Right PERRL 2 1 Round Brisk None   Left PERRL 2 1 Round Brisk None         Visual Fields       Left Right    Full Full         Extraocular Movement       Right Left    Full, Ortho Full, Ortho         Neuro/Psych     Oriented x3: Yes   Mood/Affect: Normal         Dilation     Both eyes: 2.5% Phenylephrine  @ 9:29 AM           Slit Lamp and Fundus Exam     Slit Lamp Exam       Right Left   Lids/Lashes Dermatochalasis, mild MGD Dermato, mild MGD   Conjunctiva/Sclera White and quiet, Mild conjunctivochalasis inferiorly, White and quiet   Cornea Mild arcus, 1-2+PEE, mild tear film debris Mild arcus, trace tear film debris   Anterior Chamber Deep and clear Deep and clear   Iris Round and dilated, +irridonosis Round and dilated   Lens PCIOL in excellent position, open pc PCIOL in excellent position, open PC   Anterior Vitreous Syneresis, silicone oil micro bubbles mild syneresis         Fundus Exam       Right Left   Disc Pink, sharp, +cupping, rim thinning, central pallor, mild temporal PPA Pink and sharp, +cupping   C/D Ratio 0.75 0.7   Macula Blunted foveal reflex, drusen, RPE mottling, clumping and atrophy, +CNV with stable improvement in shallow SRF/edema, no frank heme, mild progression of GA Flat, blunted foveal reflex, Drusen, RPE mottling, clumping and atrophy, mild progression of GA, No heme or  edema, mild refractile drusen   Vessels attenuated, Tortuous attenuated, Tortuous   Periphery Attached, mild peripheral drusen, mild reticular degeneration, focal paving stone inferiorly, no heme Attached, mild peripheral drusen, mild reticular degeneration, paving stone inferiorly, No heme           Refraction     Wearing Rx       Sphere Cylinder Axis Add   Right -0.50 +1.75 010 +2.50   Left -2.50 +2.00 175 +2.50           IMAGING AND PROCEDURES  Imaging and Procedures for 01/15/2024  OCT, Retina - OU - Both Eyes       Right Eye Quality was good. Central Foveal Thickness: 303. Progression has worsened. Findings include normal foveal contour, no IRF, no SRF, retinal drusen , pigment epithelial detachment, outer retinal atrophy (Stable improvement in [facility] and edema overlying PED IT fovea, mild interval progression of GA on en face image).   Left Eye Quality was poor. Central Foveal Thickness: 311. Progression has worsened. Findings include normal foveal contour, no IRF, no SRF, retinal drusen , outer retinal atrophy (Mild progression of GA on en face imaging).   Notes *Images captured and stored on drive  Diagnosis / Impression:  OD: exudative ARMD -- Stable improvement in [facility] and edema overlying PED IT fovea, mild interval progression of GA on en face image OS: nonexudative ARMD;  NFP, no IRF/SRF -- Mild progression of GA on en face imaging  Clinical management:  See below  Abbreviations: NFP - Normal foveal profile. CME - cystoid macular edema. PED - pigment epithelial detachment. IRF - intraretinal fluid. SRF - subretinal fluid. EZ - ellipsoid zone. ERM - epiretinal  membrane. ORA - outer retinal atrophy. ORT - outer retinal tubulation. SRHM - subretinal hyper-reflective material. IRHM - intraretinal hyper-reflective material             ASSESSMENT/PLAN:    ICD-10-CM   1. Exudative age-related macular degeneration of right eye with active choroidal  neovascularization (HCC)  H35.3211 OCT, Retina - OU - Both Eyes    2. Intermediate stage nonexudative age-related macular degeneration of left eye  H35.3122     3. Amblyopia of eye, left  H53.002     4. Primary open angle glaucoma (POAG) of both eyes, moderate stage  H40.1132     5. Essential hypertension  I10     6. Hypertensive retinopathy of both eyes  H35.033     7. Pseudophakia of both eyes  Z96.1     8. Dry eyes, bilateral  H04.123       1. 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 - s/p IVA OD # 1 (07.29.21), #2 (08.30.21), #3 (09.27.21), #4 (11.02.21), #5 (12.13.21), #6 (01.31.22), #7 (03.24.22), #8 (09.26.22), #9 (11.14.22), #10 (01.03.23), #11 (02.27.23), #12 (05.08.23), #13 (07.31.23)  - excellent initial response and maintenance  - BCVA OD 20/20 - stable - OCT shows Stable improvement in [facility] and edema overlying PED IT fovea, mild interval progression of GA on en face image at 2.1 yrs since last IVA OD  - discussed findings and treatment options   - recommend holding IVA today with follow up in 6 mos  - pt in agreement - [facility] informed consent obtained, signed and scanned 05.08.23  - f/u in 6 months, sooner prn -- DFE, OCT  2. Age related macular degeneration, non-exudative, OS - The incidence, anatomy, and pathology of dry AMD, risk of progression, and the AREDS and AREDS 2 studies including smoking risks discussed with patient.  - recommend Amsler grid monitoring  - OCT shows interval progression of GA  - BCVA OS stable at 20/60  3. Amblyopia OS  - long standing  - baseline BCVA ~20/50-60  - monitor  4. POAG OU  - was under the expert management of Dr. Lelon  - on Timolol  BID OU per Dr. Lelon  - IOP monitor  5,6. Hypertensive retinopathy OU - discussed importance of tight BP control - monitor  7. Pseudophakia OU  - s/p CE/IOL  - IOL in good position,  doing well  - monitor  8. Dry eyes OU (OD>OS)  - previously managed by Dr. Lelon   Ophthalmic Meds Ordered this visit:  No orders of the defined types were placed in this encounter.    Return in about 6 months (around 07/17/2024) for exu ARMD OD, DFE, OCT.  There are no Patient Instructions on file for this visit.  This document serves as a record of services personally performed by Redell JUDITHANN Hans, MD, PhD. It was created on their behalf by Avelina Pereyra, COA an ophthalmic technician. The creation of this record is the provider's dictation and/or activities during the visit.   Electronically signed by: Avelina GORMAN Pereyra, COT  01/18/24  1:23 AM   This document serves as a record of services personally performed by Redell JUDITHANN Hans, MD, PhD. It was created on their behalf by Almetta Pesa, an ophthalmic technician. The creation of this record is the provider's dictation and/or activities during the visit.    Electronically signed by: Almetta Pesa, OA,  01/18/24  1:23 AM  Redell JUDITHANN Hans, M.D., Ph.D. Diseases & Surgery of the Retina and Vitreous [facility]  I have reviewed the above documentation for accuracy and completeness, and I agree with the above. Redell JUDITHANN Hans, M.D., Ph.D. 01/18/24 1:27 AM   Abbreviations: M myopia (nearsighted); A astigmatism; H hyperopia (farsighted); P presbyopia; Mrx spectacle prescription;  CTL contact lenses; OD right eye; OS left eye; OU both eyes  XT exotropia; ET esotropia; PEK punctate epithelial keratitis; PEE punctate epithelial erosions; DES dry eye syndrome; MGD meibomian gland dysfunction; ATs artificial tears; PFAT's preservative free artificial tears; NSC nuclear sclerotic cataract; PSC posterior subcapsular cataract; ERM epi-retinal membrane; PVD posterior vitreous detachment; RD retinal detachment; DM diabetes mellitus; DR diabetic retinopathy; NPDR non-proliferative diabetic retinopathy; PDR proliferative diabetic  retinopathy; CSME clinically significant macular edema; DME diabetic macular edema; dbh dot blot hemorrhages; CWS cotton wool spot; POAG primary open angle glaucoma; C/D cup-to-disc ratio; HVF humphrey visual field; GVF goldmann visual field; OCT optical coherence tomography; IOP intraocular pressure; BRVO Branch retinal vein occlusion; CRVO central retinal vein occlusion; CRAO central retinal artery occlusion; BRAO branch retinal artery occlusion; RT retinal tear; SB scleral buckle; PPV pars plana vitrectomy; VH Vitreous hemorrhage; PRP panretinal laser photocoagulation; IVK intravitreal kenalog; VMT vitreomacular traction; MH Macular hole;  NVD neovascularization of the disc; NVE neovascularization elsewhere; AREDS age related eye disease study; ARMD age related macular degeneration; POAG primary open angle glaucoma; EBMD epithelial/anterior basement membrane dystrophy; ACIOL anterior chamber intraocular lens; IOL intraocular lens; PCIOL posterior chamber intraocular lens; Phaco/IOL phacoemulsification with intraocular lens placement; PRK photorefractive keratectomy; LASIK laser assisted in situ keratomileusis; HTN hypertension; DM diabetes mellitus; COPD chronic obstructive pulmonary disease

## 2024-01-15 ENCOUNTER — Encounter (INDEPENDENT_AMBULATORY_CARE_PROVIDER_SITE_OTHER): Payer: Self-pay | Admitting: Ophthalmology

## 2024-01-15 ENCOUNTER — Ambulatory Visit (INDEPENDENT_AMBULATORY_CARE_PROVIDER_SITE_OTHER): Payer: Medicare Other | Admitting: Ophthalmology

## 2024-01-15 DIAGNOSIS — H353122 Nonexudative age-related macular degeneration, left eye, intermediate dry stage: Secondary | ICD-10-CM

## 2024-01-15 DIAGNOSIS — H35033 Hypertensive retinopathy, bilateral: Secondary | ICD-10-CM | POA: Diagnosis not present

## 2024-01-15 DIAGNOSIS — H401132 Primary open-angle glaucoma, bilateral, moderate stage: Secondary | ICD-10-CM

## 2024-01-15 DIAGNOSIS — Z961 Presence of intraocular lens: Secondary | ICD-10-CM

## 2024-01-15 DIAGNOSIS — H353211 Exudative age-related macular degeneration, right eye, with active choroidal neovascularization: Secondary | ICD-10-CM

## 2024-01-15 DIAGNOSIS — H04123 Dry eye syndrome of bilateral lacrimal glands: Secondary | ICD-10-CM

## 2024-01-15 DIAGNOSIS — I10 Essential (primary) hypertension: Secondary | ICD-10-CM

## 2024-01-15 DIAGNOSIS — H53002 Unspecified amblyopia, left eye: Secondary | ICD-10-CM

## 2024-01-18 ENCOUNTER — Encounter (INDEPENDENT_AMBULATORY_CARE_PROVIDER_SITE_OTHER): Payer: Self-pay | Admitting: Ophthalmology

## 2024-03-21 ENCOUNTER — Inpatient Hospital Stay: Payer: PRIVATE HEALTH INSURANCE | Attending: Hematology and Oncology

## 2024-03-21 ENCOUNTER — Other Ambulatory Visit: Payer: Self-pay | Admitting: Hematology and Oncology

## 2024-03-21 ENCOUNTER — Inpatient Hospital Stay: Payer: PRIVATE HEALTH INSURANCE | Admitting: Hematology and Oncology

## 2024-03-21 VITALS — BP 174/72 | HR 72 | Temp 97.9°F | Resp 15 | Wt 141.5 lb

## 2024-03-21 DIAGNOSIS — M069 Rheumatoid arthritis, unspecified: Secondary | ICD-10-CM | POA: Diagnosis not present

## 2024-03-21 DIAGNOSIS — D72820 Lymphocytosis (symptomatic): Secondary | ICD-10-CM | POA: Diagnosis present

## 2024-03-21 DIAGNOSIS — Z87891 Personal history of nicotine dependence: Secondary | ICD-10-CM | POA: Insufficient documentation

## 2024-03-21 DIAGNOSIS — J449 Chronic obstructive pulmonary disease, unspecified: Secondary | ICD-10-CM | POA: Diagnosis not present

## 2024-03-21 DIAGNOSIS — Z79899 Other long term (current) drug therapy: Secondary | ICD-10-CM | POA: Diagnosis not present

## 2024-03-21 DIAGNOSIS — Z801 Family history of malignant neoplasm of trachea, bronchus and lung: Secondary | ICD-10-CM | POA: Insufficient documentation

## 2024-03-21 DIAGNOSIS — Z86011 Personal history of benign neoplasm of the brain: Secondary | ICD-10-CM | POA: Insufficient documentation

## 2024-03-21 LAB — CBC WITH DIFFERENTIAL (CANCER CENTER ONLY)
Abs Immature Granulocytes: 0.02 K/uL (ref 0.00–0.07)
Basophils Absolute: 0 K/uL (ref 0.0–0.1)
Basophils Relative: 0 %
Eosinophils Absolute: 0 K/uL (ref 0.0–0.5)
Eosinophils Relative: 1 %
HCT: 38.4 % (ref 36.0–46.0)
Hemoglobin: 13.2 g/dL (ref 12.0–15.0)
Immature Granulocytes: 0 %
Lymphocytes Relative: 28 %
Lymphs Abs: 1.4 K/uL (ref 0.7–4.0)
MCH: 30.9 pg (ref 26.0–34.0)
MCHC: 34.4 g/dL (ref 30.0–36.0)
MCV: 89.9 fL (ref 80.0–100.0)
Monocytes Absolute: 0.8 K/uL (ref 0.1–1.0)
Monocytes Relative: 16 %
Neutro Abs: 2.8 K/uL (ref 1.7–7.7)
Neutrophils Relative %: 55 %
Platelet Count: 98 K/uL — ABNORMAL LOW (ref 150–400)
RBC: 4.27 MIL/uL (ref 3.87–5.11)
RDW: 15.9 % — ABNORMAL HIGH (ref 11.5–15.5)
Smear Review: NORMAL
WBC Count: 5.2 K/uL (ref 4.0–10.5)
nRBC: 0 % (ref 0.0–0.2)

## 2024-03-21 LAB — CMP (CANCER CENTER ONLY)
ALT: 10 U/L (ref 0–44)
AST: 19 U/L (ref 15–41)
Albumin: 4 g/dL (ref 3.5–5.0)
Alkaline Phosphatase: 47 U/L (ref 38–126)
Anion gap: 5 (ref 5–15)
BUN: 27 mg/dL — ABNORMAL HIGH (ref 8–23)
CO2: 33 mmol/L — ABNORMAL HIGH (ref 22–32)
Calcium: 9.8 mg/dL (ref 8.9–10.3)
Chloride: 103 mmol/L (ref 98–111)
Creatinine: 0.99 mg/dL (ref 0.44–1.00)
GFR, Estimated: 55 mL/min — ABNORMAL LOW (ref >60)
Glucose, Bld: 78 mg/dL (ref 70–99)
Potassium: 3.8 mmol/L (ref 3.5–5.1)
Sodium: 141 mmol/L (ref 135–145)
Total Bilirubin: 0.4 mg/dL (ref 0.0–1.2)
Total Protein: 6.8 g/dL (ref 6.5–8.1)

## 2024-03-21 LAB — LACTATE DEHYDROGENASE: LDH: 228 U/L — ABNORMAL HIGH (ref 98–192)

## 2024-03-21 NOTE — Progress Notes (Signed)
 [facility] [phone]   [phone]  PROGRESS NOTE  Patient Care Team: Pcp, No as PCP - General Tat, Asberry RAMAN, DO as Consulting Physician (Neurology)  Hematological/Oncological History # Monoclonal B Cell Lymphocytosis  -04/15/2022: Labs from rheumatologist, Dr. Cindy Aryal-Hgb 12.9, WBC 4.8, Plt 181, ESR 8, CRP 0.58 (H). Blood smear showed high number of atypical lymphocytes measuring approximately 16%.    -05/10/2022: Establish care with [facility] Hematology with Dr. Norleen Kidney and [employer] PA-C. Flow cytometry showed findings consistent with minimal/early involvement by a B-cell lymphoproliferative process particularly monoclonal B-cell lymphocytosis     Interval History:  Kayla Hurley 89 y.o. female with medical history significant for monoclonal B-cell lymphocytosis who presents for a follow up visit. The patient's last visit was on 03/16/2023. In the interim since the last visit she has had no major health.  Her diagnosis was confirmed with flow cytometry showing a B-cell lymphoproliferative process, consistent with monoclonal B-cell lymphocytosis.  On exam today Kayla Hurley reports she has been pretty good overall in the interim since our last visit.  She reports her energy levels are little low at about 3 or 4 out of 10.  She is having no lightheadedness, dizziness, or shortness of breath.  She reports she is breathing okay.  She is not having any issues with nausea, vomiting, diarrhea.  She denies any fevers, chills, sweats.  She does not have any bumps or lumps concerning for lymphadenopathy.  She is currently wearing compression socks.  Overall she feels quite well and has no questions concerns or complaints today.  A full 10 point ROS is otherwise negative.  MEDICAL HISTORY:  Past Medical History:  Diagnosis Date   COPD (chronic obstructive pulmonary disease) (HCC)    Glaucoma    Hypertensive retinopathy    Macular degeneration     Meningioma (HCC)    Rheumatoid arthritis (HCC)     SURGICAL HISTORY: Past Surgical History:  Procedure Laterality Date   APPENDECTOMY     CATARACT EXTRACTION     CESAREAN SECTION     x 3   CHOLECYSTECTOMY     CRANIECTOMY / CRANIOTOMY FOR EXCISION OF BRAIN TUMOR     meningioma   EYE SURGERY     HYSTEROSCOPY WITH D & C     INGUINAL HERNIA REPAIR     INGUINAL HERNIA REPAIR Left 04/12/2023   Procedure: OPEN REPAIR RECURRENT LEFT INGUINAL HERNIA AND OPEN REPAIR LEFT FEMORAL HERNIA WITH MESH;  Surgeon: Belinda Cough, MD;  Location: [facility];  Service: General;  Laterality: Left;   YAG LASER APPLICATION      SOCIAL HISTORY: Social History   Socioeconomic History   Marital status: Widowed    Spouse name: Not on file   Number of children: Not on file   Years of education: Not on file   Highest education level: Not on file  Occupational History   Not on file  Tobacco Use   Smoking status: Former    Current packs/day: 1.00    Average packs/day: 1 pack/day for 50.0 years (50.0 ttl pk-yrs)    Types: Cigarettes   Smokeless tobacco: Never   Tobacco comments:    Quit smoking 1990's  Vaping Use   Vaping status: Never Used  Substance and Sexual Activity   Alcohol  use: Not Currently    Comment: occasional   Drug use: Never   Sexual activity: Not Currently  Other Topics Concern   Not on file  Social History  Narrative   Right handed    Social Drivers of Health   Financial Resource Strain: Not on file  Food Insecurity: No Food Insecurity (04/09/2023)   Hunger Vital Sign    Worried About Running Out of Food in the Last Year: Never true    Ran Out of Food in the Last Year: Never true  Transportation Needs: No Transportation Needs (04/09/2023)   PRAPARE - Administrator, Civil Service (Medical): No    Lack of Transportation (Non-Medical): No  Physical Activity: Not on file  Stress: Not on file  Social Connections: Not on file  Intimate Partner Violence: Not At Risk  (04/09/2023)   Humiliation, Afraid, Rape, and Kick questionnaire    Fear of Current or Ex-Partner: No    Emotionally Abused: No    Physically Abused: No    Sexually Abused: No    FAMILY HISTORY: Family History  Problem Relation Age of Onset   Other Mother        died during open heart surgery   Heart disease Father    Lung cancer Maternal Grandfather        smoker   Diabetes Son     ALLERGIES:  is allergic to adhesive [tape], cucumber extract, lactose intolerance (gi), and penicillins.  MEDICATIONS:  Current Outpatient Medications  Medication Sig Dispense Refill   acetaminophen  (TYLENOL ) 500 MG tablet Take 2 tablets (1,000 mg total) by mouth every 6 (six) hours as needed. (Patient not taking: Reported on 07/17/2023) 30 tablet 0   Calcium -Magnesium -Zinc (CAL-MAG-ZINC PO) Take 1-2 tablets by mouth daily.     Cholecalciferol  (VITAMIN D3) 125 MCG (5000 UT) capsule Take 5,000 Units by mouth daily.     clobetasol  (TEMOVATE ) 0.05 % external solution Apply 1 Application topically 2 (two) times daily as needed (as directed- to affected area(s); avoid face/groin/underarms). (Patient not taking: Reported on 07/17/2023)     diclofenac  Sodium (VOLTAREN ) 1 % GEL Apply 2 g topically daily as needed (affected sites- for pain).     docusate sodium  (COLACE) 100 MG capsule Take 1 capsule (100 mg total) by mouth 2 (two) times daily.     fluticasone  (CUTIVATE ) 0.05 % cream Apply 1 Application topically 2 (two) times daily as needed (as directed- to affected area(s)). (Patient not taking: Reported on 07/17/2023)     fluticasone  furoate-vilanterol (BREO ELLIPTA ) 200-25 MCG/ACT AEPB Inhale 1 puff into the lungs daily. 60 each 5   folic acid  (FOLVITE ) 1 MG tablet Take 2 mg by mouth daily.     GAVISCON EXTRA STRENGTH 160-105 MG CHEW Chew 1 tablet by mouth every 6 (six) hours as needed (for gas).     hydrochlorothiazide  (HYDRODIURIL ) 25 MG tablet Take 25 mg by mouth daily. (Patient not taking: Reported on  07/17/2023)     ketoconazole (NIZORAL) 2 % cream Apply 1 Application topically daily as needed for irritation. (Patient not taking: Reported on 07/17/2023)     leucovorin  (WELLCOVORIN ) 5 MG tablet Take 10 mg by mouth every Friday.     levothyroxine  (SYNTHROID ) 50 MCG tablet Take 50 mcg by mouth daily before breakfast.     methotrexate  (RHEUMATREX) 2.5 MG tablet Take 4 tablets (10 mg total) by mouth every Thursday. Resume from next week     Multiple Vitamins-Minerals (PRESERVISION AREDS 2) CAPS Take 1 capsule by mouth in the morning and at bedtime.     NON FORMULARY Place 1-2 tablets under the tongue See admin instructions. Hyland's leg cramp tablets- Dissolve 1-2 tablets under  the tongue every four hours as needed for leg cramps     polyethylene glycol (MIRALAX  / GLYCOLAX ) 17 g packet Take 17 g by mouth daily. (Patient not taking: Reported on 07/17/2023)     potassium chloride  (KLOR-CON ) 10 MEQ tablet Take 10 mEq by mouth in the morning and at bedtime.     primidone  (MYSOLINE ) 50 MG tablet TAKE 1/2 TABLET BY MOUTH AT BEDTIME (Patient not taking: Reported on 07/17/2023) 90 tablet 0   salsalate  (DISALCID) 750 MG tablet Take 1,500 mg by mouth 2 (two) times daily.     timolol  (TIMOPTIC ) 0.5 % ophthalmic solution Place 1 drop into both eyes 2 (two) times daily.     VENTOLIN  HFA 108 (90 Base) MCG/ACT inhaler Inhale 1 puff into the lungs every 4 (four) hours as needed for shortness of breath. 1 each 0   No current facility-administered medications for this visit.    REVIEW OF SYSTEMS:   Constitutional: ( - ) fevers, ( - )  chills , ( - ) night sweats Eyes: ( - ) blurriness of vision, ( - ) double vision, ( - ) watery eyes Ears, nose, mouth, throat, and face: ( - ) mucositis, ( - ) sore throat Respiratory: ( - ) cough, ( - ) dyspnea, ( - ) wheezes Cardiovascular: ( - ) palpitation, ( - ) chest discomfort, ( - ) lower extremity swelling Gastrointestinal:  ( - ) nausea, ( - ) heartburn, ( - ) change in  bowel habits Skin: ( - ) abnormal skin rashes Lymphatics: ( - ) new lymphadenopathy, ( - ) easy bruising Neurological: ( - ) numbness, ( - ) tingling, ( - ) new weaknesses Behavioral/Psych: ( - ) mood change, ( - ) new changes  All other systems were reviewed with the patient and are negative.  PHYSICAL EXAMINATION:  There were no vitals filed for this visit.   There were no vitals filed for this visit.    GENERAL: Well-appearing elderly Caucasian female, alert, no distress and comfortable SKIN: skin color, texture, turgor are normal, no rashes or significant lesions EYES: conjunctiva are pink and non-injected, sclera clear LUNGS: clear to auscultation and percussion with normal breathing effort HEART: regular rate & rhythm and no murmurs and no lower extremity edema Musculoskeletal: no cyanosis of digits and no clubbing  PSYCH: alert & oriented x 3, fluent speech NEURO: no focal motor/sensory deficits  LABORATORY DATA:  I have reviewed the data as listed    Latest Ref Rng & Units 04/25/2023    1:05 PM 04/14/2023    5:44 AM 04/10/2023    4:43 AM  CBC  WBC 4.0 - 10.5 K/uL 8.9  9.2  8.6   Hemoglobin 12.0 - 15.0 g/dL 87.6  87.9  86.7   Hematocrit 36.0 - 46.0 % 38.0  35.4  39.4   Platelets 150 - 400 K/uL 113  108  137        Latest Ref Rng & Units 04/25/2023   12:15 PM 04/14/2023    5:44 AM 04/10/2023    4:43 AM  CMP  Glucose 70 - 99 mg/dL 898  872  84   BUN 8 - 23 mg/dL 22  19  18    Creatinine 0.44 - 1.00 mg/dL 9.27  9.45  9.30   Sodium 135 - 145 mmol/L 136  135  136   Potassium 3.5 - 5.1 mmol/L 3.2  3.6  3.6   Chloride 98 - 111 mmol/L 95  97  97   CO2 22 - 32 mmol/L 29  28  27    Calcium  8.9 - 10.3 mg/dL 9.4  8.5  9.0   Total Protein 6.5 - 8.1 g/dL 6.7  5.3  5.9   Total Bilirubin <1.2 mg/dL 0.6  0.4  1.0   Alkaline Phos 38 - 126 U/L 59  36  39   AST 15 - 41 U/L 36  15  20   ALT 0 - 44 U/L 27  9  13      RADIOGRAPHIC STUDIES: No results found.  ASSESSMENT &  PLAN Kayla Hurley 89 y.o. female with medical history significant for monoclonal B-cell lymphocytosis who presents for a follow up visit.  # Monoclonal B Cell Lymphocytosis -- Diagnosis confirmed by flow cytometry on 05/10/2022.  Will repeat this test today as the findings last time were quite modest. -- Labs today show white blood cell count 5.2, hemoglobin 13.2, MCV 89.9, platelets 98.  Of note the platelets are highly fluctuant, suspecting ITP like condition.  Do not suspect that her platelets are low due to her monoclonal B-cell lymphocytosis -- No evidence of lymphadenopathy or clear signs of this is progression to CLL. --At this time recommend monitoring for progression to CLL  -- Plan for return to clinic in 12 months time.    No orders of the defined types were placed in this encounter.   All questions were answered. The patient knows to call the clinic with any problems, questions or concerns.  A total of more than 30 minutes were spent on this encounter with face-to-face time and non-face-to-face time, including preparing to see the patient, ordering tests and/or medications, counseling the patient and coordination of care as outlined above.   Norleen IVAR Kidney, MD Department of Hematology/Oncology [facility] Phone: [phone] Pager: [phone] Email: [email or web address]  03/21/2024 7:50 AM

## 2024-05-27 ENCOUNTER — Ambulatory Visit: Admitting: Primary Care

## 2024-07-02 NOTE — Progress Notes (Shared)
 " [facility] - Clinic Note  07/15/2024     CHIEF COMPLAINT Patient presents for No chief complaint on file.  HISTORY OF PRESENT ILLNESS: Kayla Hurley is a 89 y.o. female who presents to the clinic today for:    Patient states she has new specs, doesn't like them and has another pair she doesn't see out of well. Has them made at [employer]. Taking AREDS twice a day. Some pain in OU, uses Systane.   Referring physician: No referring provider defined for this encounter.  HISTORICAL INFORMATION:   Selected notes from the MEDICAL RECORD NUMBER Referred by Dr. Lelon for concern of exu ARMD OD   CURRENT MEDICATIONS: Current Outpatient Medications (Ophthalmic Drugs)  Medication Sig   timolol  (TIMOPTIC ) 0.5 % ophthalmic solution Place 1 drop into both eyes 2 (two) times daily.   No current facility-administered medications for this visit. (Ophthalmic Drugs)   Current Outpatient Medications (Other)  Medication Sig   acetaminophen  (TYLENOL ) 500 MG tablet Take 2 tablets (1,000 mg total) by mouth every 6 (six) hours as needed. (Patient not taking: Reported on 07/17/2023)   Calcium -Magnesium -Zinc (CAL-MAG-ZINC PO) Take 1-2 tablets by mouth daily.   Cholecalciferol  (VITAMIN D3) 125 MCG (5000 UT) capsule Take 5,000 Units by mouth daily.   clobetasol  (TEMOVATE ) 0.05 % external solution Apply 1 Application topically 2 (two) times daily as needed (as directed- to affected area(s); avoid face/groin/underarms). (Patient not taking: Reported on 07/17/2023)   diclofenac  Sodium (VOLTAREN ) 1 % GEL Apply 2 g topically daily as needed (affected sites- for pain).   docusate sodium  (COLACE) 100 MG capsule Take 1 capsule (100 mg total) by mouth 2 (two) times daily.   fluticasone  (CUTIVATE ) 0.05 % cream Apply 1 Application topically 2 (two) times daily as needed (as directed- to affected area(s)). (Patient not taking: Reported on 07/17/2023)   fluticasone  furoate-vilanterol (BREO ELLIPTA ) 200-25  MCG/ACT AEPB Inhale 1 puff into the lungs daily.   folic acid  (FOLVITE ) 1 MG tablet Take 2 mg by mouth daily.   GAVISCON EXTRA STRENGTH 160-105 MG CHEW Chew 1 tablet by mouth every 6 (six) hours as needed (for gas).   hydrochlorothiazide  (HYDRODIURIL ) 25 MG tablet Take 25 mg by mouth daily. (Patient not taking: Reported on 07/17/2023)   ketoconazole (NIZORAL) 2 % cream Apply 1 Application topically daily as needed for irritation. (Patient not taking: Reported on 07/17/2023)   leucovorin  (WELLCOVORIN ) 5 MG tablet Take 10 mg by mouth every Friday.   levothyroxine  (SYNTHROID ) 50 MCG tablet Take 50 mcg by mouth daily before breakfast.   methotrexate  (RHEUMATREX) 2.5 MG tablet Take 4 tablets (10 mg total) by mouth every Thursday. Resume from next week   Multiple Vitamins-Minerals (PRESERVISION AREDS 2) CAPS Take 1 capsule by mouth in the morning and at bedtime.   NON FORMULARY Place 1-2 tablets under the tongue See admin instructions. Hyland's leg cramp tablets- Dissolve 1-2 tablets under the tongue every four hours as needed for leg cramps   polyethylene glycol (MIRALAX  / GLYCOLAX ) 17 g packet Take 17 g by mouth daily. (Patient not taking: Reported on 07/17/2023)   potassium chloride  (KLOR-CON ) 10 MEQ tablet Take 10 mEq by mouth in the morning and at bedtime.   primidone  (MYSOLINE ) 50 MG tablet TAKE 1/2 TABLET BY MOUTH AT BEDTIME (Patient not taking: Reported on 07/17/2023)   salsalate  (DISALCID) 750 MG tablet Take 1,500 mg by mouth 2 (two) times daily.   VENTOLIN  HFA 108 (90 Base) MCG/ACT inhaler Inhale 1  puff into the lungs every 4 (four) hours as needed for shortness of breath.   No current facility-administered medications for this visit. (Other)   REVIEW OF SYSTEMS:     ALLERGIES Allergies  Allergen Reactions   Adhesive [Tape] Other (See Comments)    Blisters    Cucumber Extract Other (See Comments)    Exact allergic reaction not cited   Lactose Intolerance (Gi) Diarrhea   Penicillins  Diarrhea, Nausea And Vomiting and Rash   PAST MEDICAL HISTORY Past Medical History:  Diagnosis Date   COPD (chronic obstructive pulmonary disease) (HCC)    Glaucoma    Hypertensive retinopathy    Macular degeneration    Meningioma (HCC)    Rheumatoid arthritis (HCC)    Past Surgical History:  Procedure Laterality Date   APPENDECTOMY     CATARACT EXTRACTION     CESAREAN SECTION     x 3   CHOLECYSTECTOMY     CRANIECTOMY / CRANIOTOMY FOR EXCISION OF BRAIN TUMOR     meningioma   EYE SURGERY     HYSTEROSCOPY WITH D & C     INGUINAL HERNIA REPAIR     INGUINAL HERNIA REPAIR Left 04/12/2023   Procedure: OPEN REPAIR RECURRENT LEFT INGUINAL HERNIA AND OPEN REPAIR LEFT FEMORAL HERNIA WITH MESH;  Surgeon: Belinda Cough, MD;  Location: [facility];  Service: General;  Laterality: Left;   YAG LASER APPLICATION     FAMILY HISTORY Family History  Problem Relation Age of Onset   Other Mother        died during open heart surgery   Heart disease Father    Lung cancer Maternal Grandfather        smoker   Diabetes Son    SOCIAL HISTORY Social History   Tobacco Use   Smoking status: Former    Current packs/day: 1.00    Average packs/day: 1 pack/day for 50.0 years (50.0 ttl pk-yrs)    Types: Cigarettes   Smokeless tobacco: Never   Tobacco comments:    Quit smoking 1990's  Vaping Use   Vaping status: Never Used  Substance Use Topics   Alcohol  use: Not Currently    Comment: occasional   Drug use: Never       OPHTHALMIC EXAM:  Not recorded    IMAGING AND PROCEDURES  Imaging and Procedures for 07/15/2024           ASSESSMENT/PLAN:    ICD-10-CM   1. Exudative age-related macular degeneration of right eye with active choroidal neovascularization (HCC)  H35.3211     2. Intermediate stage nonexudative age-related macular degeneration of left eye  H35.3122     3. Amblyopia of eye, left  H53.002     4. Primary open angle glaucoma (POAG) of both eyes, moderate stage  H40.1132      5. Essential hypertension  I10     6. Hypertensive retinopathy of both eyes  H35.033     7. Pseudophakia of both eyes  Z96.1     8. Dry eyes, bilateral  H04.123        1. 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 - s/p IVA OD # 1 (07.29.21), #2 (08.30.21), #3 (09.27.21), #4 (11.02.21), #5 (12.13.21), #6 (01.31.22), #7 (03.24.22), #8 (09.26.22), #9 (11.14.22), #10 (01.03.23), #11 (02.27.23), #12 (05.08.23), #13 (07.31.23)  - excellent initial response and maintenance  - BCVA OD 20/20 - stable -  OCT shows Stable improvement in [facility] and edema overlying PED IT fovea, mild interval progression of GA on en face image at 2.1 yrs since last IVA OD  - discussed findings and treatment options   - recommend holding IVA today with follow up in 6 mos  - pt in agreement - [facility] informed consent obtained, signed and scanned 05.08.23  - f/u in 6 months, sooner prn -- DFE, OCT  2. Age related macular degeneration, non-exudative, OS - The incidence, anatomy, and pathology of dry AMD, risk of progression, and the AREDS and AREDS 2 studies including smoking risks discussed with patient.  - recommend Amsler grid monitoring  - OCT shows interval progression of GA  - BCVA OS stable at 20/60  3. Amblyopia OS  - long standing  - baseline BCVA ~20/50-60  - monitor   4. POAG OU  - was under the expert management of Dr. Lelon  - on Timolol  BID OU per Dr. Lelon  - IOP monitor   5,6. Hypertensive retinopathy OU - discussed importance of tight BP control - monitor   7. Pseudophakia OU  - s/p CE/IOL  - IOL in good position, doing well  - monitor   8. Dry eyes OU (OD>OS)  - previously managed by Dr. Lelon   Ophthalmic Meds Ordered this visit:  No orders of the defined types were placed in this encounter.    No follow-ups on file.  There are no Patient Instructions on file for this  visit.  This document serves as a record of services personally performed by Redell JUDITHANN Hans, MD, PhD. It was created on their behalf by Paulina Jamse Gay an ophthalmic technician. The creation of this record is the provider's dictation and/or activities during the visit.   Electronically signed by: Paulina JONETTA Gay  07/02/24  3:07 PM   Redell JUDITHANN Hans, M.D., Ph.D. Diseases & Surgery of the Retina and Vitreous [facility]  Abbreviations: M myopia (nearsighted); A astigmatism; H hyperopia (farsighted); P presbyopia; Mrx spectacle prescription;  CTL contact lenses; OD right eye; OS left eye; OU both eyes  XT exotropia; ET esotropia; PEK punctate epithelial keratitis; PEE punctate epithelial erosions; DES dry eye syndrome; MGD meibomian gland dysfunction; ATs artificial tears; PFAT's preservative free artificial tears; NSC nuclear sclerotic cataract; PSC posterior subcapsular cataract; ERM epi-retinal membrane; PVD posterior vitreous detachment; RD retinal detachment; DM diabetes mellitus; DR diabetic retinopathy; NPDR non-proliferative diabetic retinopathy; PDR proliferative diabetic retinopathy; CSME clinically significant macular edema; DME diabetic macular edema; dbh dot blot hemorrhages; CWS cotton wool spot; POAG primary open angle glaucoma; C/D cup-to-disc ratio; HVF humphrey visual field; GVF goldmann visual field; OCT optical coherence tomography; IOP intraocular pressure; BRVO Branch retinal vein occlusion; CRVO central retinal vein occlusion; CRAO central retinal artery occlusion; BRAO branch retinal artery occlusion; RT retinal tear; SB scleral buckle; PPV pars plana vitrectomy; VH Vitreous hemorrhage; PRP panretinal laser photocoagulation; IVK intravitreal kenalog; VMT vitreomacular traction; MH Macular hole;  NVD neovascularization of the disc; NVE neovascularization elsewhere; AREDS age related eye disease study; ARMD age related macular degeneration; POAG primary open  angle glaucoma; EBMD epithelial/anterior basement membrane dystrophy; ACIOL anterior chamber intraocular lens; IOL intraocular lens; PCIOL posterior chamber intraocular lens; Phaco/IOL phacoemulsification with intraocular lens placement; PRK photorefractive keratectomy; LASIK laser assisted in situ keratomileusis; HTN hypertension; DM diabetes mellitus; COPD chronic obstructive pulmonary disease "

## 2024-07-15 ENCOUNTER — Encounter (INDEPENDENT_AMBULATORY_CARE_PROVIDER_SITE_OTHER): Admitting: Ophthalmology

## 2024-07-15 DIAGNOSIS — H35033 Hypertensive retinopathy, bilateral: Secondary | ICD-10-CM

## 2024-07-15 DIAGNOSIS — H353122 Nonexudative age-related macular degeneration, left eye, intermediate dry stage: Secondary | ICD-10-CM

## 2024-07-15 DIAGNOSIS — I10 Essential (primary) hypertension: Secondary | ICD-10-CM

## 2024-07-15 DIAGNOSIS — H401132 Primary open-angle glaucoma, bilateral, moderate stage: Secondary | ICD-10-CM

## 2024-07-15 DIAGNOSIS — Z961 Presence of intraocular lens: Secondary | ICD-10-CM

## 2024-07-15 DIAGNOSIS — H04123 Dry eye syndrome of bilateral lacrimal glands: Secondary | ICD-10-CM

## 2024-07-15 DIAGNOSIS — H53002 Unspecified amblyopia, left eye: Secondary | ICD-10-CM

## 2024-07-15 DIAGNOSIS — H353211 Exudative age-related macular degeneration, right eye, with active choroidal neovascularization: Secondary | ICD-10-CM

## 2025-03-21 ENCOUNTER — Inpatient Hospital Stay: Admitting: Physician Assistant

## 2025-03-21 ENCOUNTER — Inpatient Hospital Stay
